# Patient Record
Sex: Male | Born: 1960 | Hispanic: No | Marital: Married | Smoking: Never smoker
Health system: Southern US, Community
[De-identification: ages and names within clinical notes are randomized; demographics above are authoritative.]

## PROBLEM LIST (undated history)

## (undated) DIAGNOSIS — N529 Male erectile dysfunction, unspecified: Secondary | ICD-10-CM

## (undated) DIAGNOSIS — I10 Essential (primary) hypertension: Secondary | ICD-10-CM

## (undated) DIAGNOSIS — R803 Bence Jones proteinuria: Secondary | ICD-10-CM

## (undated) DIAGNOSIS — I351 Nonrheumatic aortic (valve) insufficiency: Secondary | ICD-10-CM

## (undated) DIAGNOSIS — Z9289 Personal history of other medical treatment: Secondary | ICD-10-CM

## (undated) DIAGNOSIS — F419 Anxiety disorder, unspecified: Secondary | ICD-10-CM

## (undated) DIAGNOSIS — I340 Nonrheumatic mitral (valve) insufficiency: Secondary | ICD-10-CM

## (undated) DIAGNOSIS — D72819 Decreased white blood cell count, unspecified: Secondary | ICD-10-CM

## (undated) DIAGNOSIS — D649 Anemia, unspecified: Secondary | ICD-10-CM

## (undated) DIAGNOSIS — D472 Monoclonal gammopathy: Principal | ICD-10-CM

## (undated) DIAGNOSIS — D89813 Graft-versus-host disease, unspecified: Secondary | ICD-10-CM

## (undated) DIAGNOSIS — Z2821 Immunization not carried out because of patient refusal: Secondary | ICD-10-CM

## (undated) DIAGNOSIS — I341 Nonrheumatic mitral (valve) prolapse: Secondary | ICD-10-CM

## (undated) DIAGNOSIS — C9000 Multiple myeloma not having achieved remission: Principal | ICD-10-CM

## (undated) DIAGNOSIS — I2510 Atherosclerotic heart disease of native coronary artery without angina pectoris: Secondary | ICD-10-CM

## (undated) DIAGNOSIS — E785 Hyperlipidemia, unspecified: Secondary | ICD-10-CM

## (undated) HISTORY — DX: Multiple myeloma not having achieved remission: C90.00

## (undated) HISTORY — DX: Anemia, unspecified: D64.9

## (undated) HISTORY — DX: Monoclonal gammopathy: D47.2

## (undated) HISTORY — DX: Nonrheumatic mitral (valve) insufficiency: I34.0

## (undated) HISTORY — PX: COLONOSCOPY: SHX174

## (undated) HISTORY — PX: MITRAL VALVE SURGERY: SHX714

## (undated) HISTORY — DX: Male erectile dysfunction, unspecified: N52.9

## (undated) HISTORY — DX: Immunization not carried out because of patient refusal: Z28.21

## (undated) HISTORY — DX: Anxiety disorder, unspecified: F41.9

## (undated) HISTORY — DX: Hyperlipidemia, unspecified: E78.5

## (undated) HISTORY — DX: Decreased white blood cell count, unspecified: D72.819

## (undated) HISTORY — DX: Personal history of other medical treatment: Z92.89

## (undated) HISTORY — DX: Nonrheumatic aortic (valve) insufficiency: I35.1

## (undated) HISTORY — DX: Atherosclerotic heart disease of native coronary artery without angina pectoris: I25.10

## (undated) HISTORY — PX: CARDIAC SURGERY: SHX584

## (undated) HISTORY — DX: Bence Jones proteinuria: R80.3

## (undated) HISTORY — DX: Nonrheumatic mitral (valve) prolapse: I34.1

## (undated) HISTORY — DX: Essential (primary) hypertension: I10

---

## 1999-11-14 ENCOUNTER — Encounter: Payer: Self-pay | Admitting: Emergency Medicine

## 1999-11-14 ENCOUNTER — Emergency Department (HOSPITAL_COMMUNITY): Admission: EM | Admit: 1999-11-14 | Discharge: 1999-11-14 | Payer: Self-pay | Admitting: Emergency Medicine

## 2003-10-27 HISTORY — PX: CORONARY ARTERY BYPASS GRAFT: SHX141

## 2003-11-09 ENCOUNTER — Encounter: Admission: RE | Admit: 2003-11-09 | Discharge: 2003-11-09 | Payer: Self-pay | Admitting: *Deleted

## 2003-11-12 ENCOUNTER — Inpatient Hospital Stay (HOSPITAL_COMMUNITY): Admission: RE | Admit: 2003-11-12 | Discharge: 2003-11-21 | Payer: Self-pay | Admitting: *Deleted

## 2003-12-07 ENCOUNTER — Encounter
Admission: RE | Admit: 2003-12-07 | Discharge: 2003-12-07 | Payer: Self-pay | Admitting: Thoracic Surgery (Cardiothoracic Vascular Surgery)

## 2004-06-09 ENCOUNTER — Encounter
Admission: RE | Admit: 2004-06-09 | Discharge: 2004-06-09 | Payer: Self-pay | Admitting: Thoracic Surgery (Cardiothoracic Vascular Surgery)

## 2004-08-07 ENCOUNTER — Emergency Department (HOSPITAL_COMMUNITY): Admission: EM | Admit: 2004-08-07 | Discharge: 2004-08-07 | Payer: Self-pay | Admitting: Emergency Medicine

## 2005-06-12 ENCOUNTER — Encounter
Admission: RE | Admit: 2005-06-12 | Discharge: 2005-06-12 | Payer: Self-pay | Admitting: Thoracic Surgery (Cardiothoracic Vascular Surgery)

## 2006-04-04 ENCOUNTER — Ambulatory Visit: Payer: Self-pay | Admitting: Family Medicine

## 2006-08-13 ENCOUNTER — Ambulatory Visit: Payer: Self-pay | Admitting: Family Medicine

## 2007-07-14 ENCOUNTER — Ambulatory Visit: Payer: Self-pay | Admitting: Family Medicine

## 2007-07-17 ENCOUNTER — Ambulatory Visit: Payer: Self-pay | Admitting: Family Medicine

## 2007-09-17 ENCOUNTER — Ambulatory Visit: Payer: Self-pay | Admitting: Family Medicine

## 2008-02-18 ENCOUNTER — Ambulatory Visit: Payer: Self-pay | Admitting: Family Medicine

## 2008-03-10 ENCOUNTER — Ambulatory Visit: Payer: Self-pay | Admitting: Family Medicine

## 2008-05-04 ENCOUNTER — Ambulatory Visit: Payer: Self-pay | Admitting: Family Medicine

## 2009-01-25 ENCOUNTER — Ambulatory Visit: Payer: Self-pay | Admitting: Family Medicine

## 2010-02-22 DIAGNOSIS — Z9289 Personal history of other medical treatment: Secondary | ICD-10-CM

## 2010-02-22 HISTORY — DX: Personal history of other medical treatment: Z92.89

## 2010-10-10 ENCOUNTER — Ambulatory Visit: Payer: Self-pay | Admitting: Family Medicine

## 2010-10-11 ENCOUNTER — Ambulatory Visit (INDEPENDENT_AMBULATORY_CARE_PROVIDER_SITE_OTHER): Payer: BC Managed Care – PPO | Admitting: Medical

## 2010-10-11 ENCOUNTER — Encounter: Payer: Self-pay | Admitting: Medical

## 2010-10-11 VITALS — BP 118/78 | HR 60 | Temp 98.1°F | Ht 65.0 in | Wt 156.0 lb

## 2010-10-11 DIAGNOSIS — R6882 Decreased libido: Secondary | ICD-10-CM

## 2010-10-11 DIAGNOSIS — R5383 Other fatigue: Secondary | ICD-10-CM | POA: Insufficient documentation

## 2010-10-11 DIAGNOSIS — N529 Male erectile dysfunction, unspecified: Secondary | ICD-10-CM | POA: Insufficient documentation

## 2010-10-11 DIAGNOSIS — M25549 Pain in joints of unspecified hand: Secondary | ICD-10-CM

## 2010-10-11 DIAGNOSIS — M549 Dorsalgia, unspecified: Secondary | ICD-10-CM

## 2010-10-11 DIAGNOSIS — R5381 Other malaise: Secondary | ICD-10-CM

## 2010-10-11 LAB — COMPREHENSIVE METABOLIC PANEL
ALT: 21 U/L (ref 0–53)
AST: 18 U/L (ref 0–37)
Albumin: 4.3 g/dL (ref 3.5–5.2)
Alkaline Phosphatase: 27 U/L — ABNORMAL LOW (ref 39–117)
BUN: 18 mg/dL (ref 6–23)
CO2: 26 mEq/L (ref 19–32)
Calcium: 9.9 mg/dL (ref 8.4–10.5)
Chloride: 102 mEq/L (ref 96–112)
Creat: 0.89 mg/dL (ref 0.40–1.50)
Glucose, Bld: 89 mg/dL (ref 70–99)
Potassium: 4.9 mEq/L (ref 3.5–5.3)
Sodium: 140 mEq/L (ref 135–145)
Total Bilirubin: 0.9 mg/dL (ref 0.3–1.2)
Total Protein: 8.9 g/dL — ABNORMAL HIGH (ref 6.0–8.3)

## 2010-10-11 LAB — CBC WITH DIFFERENTIAL/PLATELET
Basophils Absolute: 0 10*3/uL (ref 0.0–0.1)
Basophils Relative: 0 % (ref 0–1)
Eosinophils Absolute: 0 10*3/uL (ref 0.0–0.7)
Eosinophils Relative: 1 % (ref 0–5)
HCT: 41.3 % (ref 39.0–52.0)
Hemoglobin: 13.7 g/dL (ref 13.0–17.0)
Lymphocytes Relative: 37 % (ref 12–46)
Lymphs Abs: 1.4 10*3/uL (ref 0.7–4.0)
MCH: 31.4 pg (ref 26.0–34.0)
MCHC: 33.2 g/dL (ref 30.0–36.0)
MCV: 94.7 fL (ref 78.0–100.0)
Monocytes Absolute: 0.4 10*3/uL (ref 0.1–1.0)
Monocytes Relative: 12 % (ref 3–12)
Neutro Abs: 1.9 10*3/uL (ref 1.7–7.7)
Neutrophils Relative %: 50 % (ref 43–77)
Platelets: 182 10*3/uL (ref 150–400)
RBC: 4.36 MIL/uL (ref 4.22–5.81)
RDW: 13.5 % (ref 11.5–15.5)
WBC: 3.7 10*3/uL — ABNORMAL LOW (ref 4.0–10.5)

## 2010-10-11 LAB — POCT URINALYSIS DIPSTICK
Bilirubin, UA: NEGATIVE
Blood, UA: NEGATIVE
Glucose, UA: NEGATIVE
Ketones, UA: NEGATIVE
Leukocytes, UA: NEGATIVE
Nitrite, UA: NEGATIVE
Protein, UA: NEGATIVE
Spec Grav, UA: 1.01
Urobilinogen, UA: NEGATIVE
pH, UA: 6

## 2010-10-11 LAB — TESTOSTERONE: Testosterone: 378.97 ng/dL (ref 250–890)

## 2010-10-11 LAB — CK: Total CK: 89 U/L (ref 7–232)

## 2010-10-11 LAB — TSH: TSH: 2.41 u[IU]/mL (ref 0.350–4.500)

## 2010-10-11 MED ORDER — TIZANIDINE HCL 4 MG PO TABS: ORAL_TABLET | ORAL | Status: DC

## 2010-10-11 NOTE — Progress Notes (Signed)
Subjective:   HPI Here for c/o joint pains in hands and fingers and wrists, intermittently since being on lisinopril for months.  The pain can come and go, is worse with activity. He denies any type of rheumatological disease in self or family. Using nothing for the pain. He sees his cardiologist regularly, last visit 2 months ago, had his cholesterol checked which was okay, and says from a cardiac standpoint is doing okay. His cardiologist mentioned that his blood pressure medicine could be a factor with the joint pain.  He also c/o back pain.  He notes back injury at age 14-yo, but reinjured this again few months ago shoveling.  Currently does flares up from time to time, feels a change in the muscle and low back on the right. Uses treadmill some for exercise off and on, and stretches periodically. He works in [employer].   Also wants testosterone checked.  Feels tired a lot, sex drive is down.  Uses viagra with good response.  Wife has been after him to get this checked.   No other complaint. Otherwise has been in his usual state of health.   Reviewed his prior medical, surgical, social history.  Past Medical History  Diagnosis Date  . Coronary artery disease     s/p CABG  . Mitral valve prolapse   . Allergy   . Hyperlipidemia     Review of Systems Constitutional: +fatigue, weight loss intentional (12lbs); denies fever, chills, sweats, unexpected weight change, anorexia Allergy: negative; denies recent sneezing, itching, congestion Dermatology: denies changing moles, rash, lumps, new worrisome lesions Cardiology: denies chest pain, palpitations, edema Respiratory: denies cough, shortness of breath, dyspnea on exertion, wheezing Gastroenterology: denies abdominal pain, nausea, vomiting, diarrhea, constipation, blood in stool, changes in bowel movement, dysphagia Hematology: denies bleeding or bruising problems Musculoskeletal: +back pain, joint pains in hands and fingers;   denies myalgias, joint swelling, neck pain, cramping, gait changes Ophthalmology: gradual decline in close up vision Urology: denies dysuria, difficulty urinating, hematuria, urinary frequency, urgency, incontinence Neurology: no weakness, tingling, numbness Psychology: denies depressed mood, agitation, sleep problems     Objective:   Physical Exam  General appearance: alert, no distress, WD/WN, white male Oral cavity: MMM, tongue normal Neck: supple, no lymphadenopathy, no thyromegaly, no masses, normal ROM Chest: non tender, normal shape and expansion, vertical surgical scar from prior CABG Heart: RRR, loud late systolic crescendo murmur heard throughout the left side, otherwise normal S2 Lungs: CTA bilaterally, no wheezes, rhonchi, or rales Abdomen: +bs, soft, non tender, non distended, no masses, no hepatomegaly, no splenomegaly, no bruits Back: Mild right lumbar paraspinal tenderness, otherwise non tender, normal ROM, no scoliosis Musculoskeletal: Multiple Bony arthritic changes of PIP and DIPs joints of fingers bilaterally, otherwise upper extremities non tender, no obvious deformity, normal ROM throughout, lower extremities non tender, no obvious deformity, normal ROM throughout Extremities: no edema, no cyanosis, no clubbing Pulses: 2+ symmetric, upper and lower extremities, normal cap refill Neurological: alert,strength normal upper extremities and lower extremities, sensation normal throughout, DTRs 2+ throughout, gait normal Psychiatric: normal affect, behavior normal, pleasant      Assessment & Plan:    Encounter Diagnoses  Name Primary?  Marland Kitchen Arthralgia of hand Yes  . Back pain   . Fatigue   . Libido, decreased     1) We will check labs today and call you with results 2) I prescribed Tizanidine muscle relaxer that you can use at bedtime for back pain and spasm 3) consider taking  Tylenol Arthritis twice daily for arthritis and back pain 4) your joint pains are in part  related to osteoarthritis.  I have included a handout below for information.

## 2010-10-11 NOTE — Patient Instructions (Addendum)
1) We will check labs today and call you with results 2) I prescribed Tizanidine muscle relaxer that you can use at bedtime for back pain and spasm 3) consider taking Tylenol Arthritis twice daily for arthritis and back pain 4) your joint pains are in part related to osteoarthritis.  I have included a handout below for information.   Osteoarthritis Osteoarthritis is the most common form of arthritis. It is redness, soreness, and swelling (inflammation) affecting the cartilage. Cartilage acts as a cushion, covering the ends of bones where they meet to form a joint. CAUSES Over time, the cartilage begins to wear away. This causes bone to rub on bone. This produces pain and stiffness in the affected joints. Factors that contribute to this problem are:  Excessive body weight.   Age.   Overuse of joints.  SYMPTOMS  People with osteoarthritis usually experience joint pain, swelling, or stiffness.   Over time, the joint may lose its normal shape.   Small deposits of bone (osteophytes) may grow on the edges of the joint.   Bits of bone or cartilage can break off and float inside the joint space. This may cause more pain and damage.   Osteoarthritis can lead to depression, anxiety, feelings of helplessness, and limitations on daily activities.  The most commonly affected joints are in the:  Ends of the fingers.   Thumbs.   Neck.   Lower back.   Knees.   Hips.  DIAGNOSIS Diagnosis is mostly based on your symptoms and exam. Tests may be helpful, including:  X-rays of the affected joint.   A computerized magnetic scan (MRI).   Blood tests to rule out other types of arthritis.   Joint fluid tests. This involves using a needle to draw fluid from the joint and examining the fluid under a microscope.  TREATMENT Goals of treatment are to control pain, improve joint function, maintain a normal body weight, and maintain a healthy lifestyle. Treatment approaches may include:  A  prescribed exercise program with rest and joint relief.   Weight control with nutritional education.   Pain relief techniques such as:   Properly applied heat and cold.   Electric pulses delivered to nerve endings under the skin (transcutaneous electrical nerve stimulation, TENS).   Massage.   Certain supplements. Ask your caregiver before using any supplements, especially in combination with prescribed drugs.   Medicines to control pain, such as:   Acetaminophen.   Nonsteroidal anti-inflammatory drugs (NSAIDs), such as naproxen.   Narcotic or central-acting agents, such as tramadol. This drug carries a risk of addiction and is generally prescribed for short-term use.   Corticosteroids. These can be given orally or as injection. This is a short-term treatment, not recommended for routine use.   Surgery to reposition the bones and relieve pain (osteotomy) or to remove loose pieces of bone and cartilage. Joint replacement may be needed in advanced states of osteoarthritis.  HOME CARE INSTRUCTIONS Your caregiver can recommend specific types of exercise. These may include:  Strengthening exercises. These are done to strengthen the muscles that support joints affected by arthritis. They can be performed with weights or with exercise bands to add resistance.   Aerobic activities. These are exercises, such as brisk walking or low-impact aerobics, that get your heart pumping. They can help keep your lungs and circulatory system in shape.   Range-of-motion activities. These keep your joints limber.   Balance and agility exercises. These help you maintain daily living skills.  Learning about your  condition and being actively involved in your care will help improve the course of your osteoarthritis. SEEK MEDICAL CARE IF:  You feel hot or your skin turns red.   You develop a rash in addition to your joint pain.   You have an oral temperature above 101.  FOR MORE INFORMATION National  Institute of Arthritis and Musculoskeletal and Skin Diseases: www.niams.http://www.myers.net/ [employer] on Aging: https://walker.com/ American College of Rheumatology: www.rheumatology.org Document Released: 05/14/2005 Document Re-Released: 11/01/2009 [facility] Patient Information 2011 [place], [place].

## 2010-10-12 LAB — SEDIMENTATION RATE: Sed Rate: 30 mm/hr — ABNORMAL HIGH (ref 0–16)

## 2010-10-13 NOTE — Op Note (Signed)
NAME:  [name], [name]                             ACCOUNT NO.:  [id number]   MEDICAL RECORD NO.:  [id number]                   PATIENT TYPE:  INP   LOCATION:  2016                                 FACILITY:  [facility]   PHYSICIAN:  Sheldon Silvan, M.D.                   DATE OF BIRTH:  1961/05/15   DATE OF PROCEDURE:  11/16/2003  DATE OF DISCHARGE:                                 OPERATIVE REPORT   OPERATION PERFORMED:  Intraoperative transesophageal echocardiography (TEE).   INDICATIONS FOR PROCEDURE:  Mr. Cueto was brought to the operating room by  Viviann Spare C. Dorris Fetch, M.D. for coronary artery bypass.  There was some  question of mitral valve disorder consisting of prolapse and regurgitation.  It was felt that intraoperative TEE would be appropriate method of  monitoring and also useful for diagnostic purposes.   DESCRIPTION OF PROCEDURE:  After induction of general anesthesia and  endotracheal intubation, the [employer]ipl[employer] TEE probe was  sheathed and lubricated adequately.  The probe was passed uneventfully after  one pass without difficulty into the esophagus through the pharynx.   The left ventricle was visualized and felt to be slightly thickened  concentrically.  There was good contractility noted except there was some  area-specific hypokinesis in the posterior wall.  The mitral valve apparatus  was examined and found to be billowing somewhat in the posterior leaflet.  There was 1 to 2+ regurgitation noted centrally.  Even with stress  maneuvers, this was not increased.  The left atrial appendage was examined  and found to be free of thrombus or smoke.   The aortic valve was examined and three leaflets were seen.  There was  little or no sclerosis noted.  In the long axis view and with the Color Flow  exam, there was 1% regurgitation in an eccentric fashion superiorly.  There  was no stenosis noted.  The interatrial septum was examined and no PFO was  noted on Color Flow  exam.  The tricuspid valve was examined and found to be  normal with 0 to trace regurgitation on Color Flow exam.  The right  ventricle was unremarkable.   The patient was placed on cardiopulmonary bypass by Dr. Dorris Fetch and five  bypasses were performed.  At the conclusion of the procedure, the patient  was weaned successfully from bypass and examination of the heart using TEE  was unchanged.  Monitoring of ventricular filling was performed during the  weaning process with successful separation from bypass.  The probe was  removed prior to transporting the patient to the SICU.                                               Sheldon Silvan, M.D.  DC/MEDQ  D:  11/18/2003  T:  11/20/2003  Job:  [id number]

## 2010-10-13 NOTE — Discharge Summary (Signed)
NAME:  [name], [name]                             ACCOUNT NO.:  [id number]   MEDICAL RECORD NO.:  [id number]                   PATIENT TYPE:  INP   LOCATION:  2016                                 FACILITY:  [facility]   PHYSICIAN:  Tyler Decent. Dorris Li, M.D.         DATE OF BIRTH:  [birth year]   DATE OF ADMISSION:  11/12/2003  DATE OF DISCHARGE:  11/21/2003                                 DISCHARGE SUMMARY   ADDENDUM TO JOB NUMBER [id number] [id number]:  As anticipated, [name] was discharged home  on November 21, 2003; however, the reflects a change in his discharge  medications:  1. Aspirin 81 mg 1 p.o. q.d.  2. Toprol-XL 25 mg 1 p.o. q.d.  3. Lipitor 10 mg 1 p.o. q.d.  4. Folate acid 1 mg p.o. q.d.  5. Lasix 20 mg 1 p.o. q.d.  6. Potassium 10 mEq 1 p.o. q.d.  7. Plavix 75 mg 1 p.o. q.d.  8. Allegra 180 mg 1 p.o. q.d.  9. Laxative of choice p.r.n. constipation.  10.      Ultram 50 mg 1 to 2 tablets p.o. q.4h. p.r.n. mild to moderate     pain.  11.      Tylox 1 to 2 tablets p.o. q.4-6h. p.r.n. severe pain.   All other additional discharge instructions are as previously dictated.      [name], P.A.                  [name], M.D.    AWZ/MEDQ  D:  11/21/2003  T:  11/22/2003  Job:  [id number]   cc:   Sharlot Gowda, M.D.  [address]  [place], [place] [address]  Fax: [phone]

## 2010-10-13 NOTE — Discharge Summary (Signed)
NAME:  [name], [name]                             ACCOUNT NO.:  [id number]   MEDICAL RECORD NO.:  [id number]                   PATIENT TYPE:  INP   LOCATION:  2016                                 FACILITY:  [facility]   PHYSICIAN:  Salvatore Decent. Dorris Fetch, M.D.         DATE OF BIRTH:  Sep 25, 1960   DATE OF ADMISSION:  11/12/2003  DATE OF DISCHARGE:  11/21/2003                                 DISCHARGE SUMMARY   ADMISSION DIAGNOSIS:  Strong family history of coronary artery disease and  an abnormal Cardiolite stress test.   ADDITIONAL DIAGNOSES/DISCHARGE DIAGNOSES:  1. Strong family history of coronary artery disease.  2. Severe three-vessel coronary artery disease, status post coronary artery     bypass grafting x5, completed on November 16, 2003.  3. Mildly depressed left ventricular systolic function with an ejection     fraction of 45-50%.  4. Hyperlipidemia.  5. Hypertension.   HOSPITAL MANAGEMENT/PROCEDURES:  1. Cardiac catheterization completed on November 12, 2003.  This revealed severe     three-vessel coronary artery disease and mildly depressed left     ventricular ejection fraction.  2. Preliminary arterial evaluation for planned cardiac surgery completed on     November 12, 2003.  This included bilateral carotid Duplex exams, palmar arch     exam, ankle/brachial indices.  3. Coronary artery bypass grafting x5 completed on November 16, 2003 by Dr.     Dorris Fetch.  4. Initiation or cardiac rehab Phase I.   CONSULTATIONS:  1. Care management.  2. Cardiac rehab.   HISTORY OF PRESENT ILLNESS:  Mr. Conly is a 50 year old male with a history  of borderline hypertension, hyperlipidemia, and a strong family history of  coronary artery disease, who had recently undergone a routine office visit  with Dr. Susann Givens at which time he noticed a new murmur.  The patient had no  significant previous cardiac history and specifically denied any prior  angina or CHF symptoms.  The patient was then referred to  Dr. Mikey Bussing  office of [facility].  He was seen and examined on October 28, 2003 by Dr. Jenne Campus.  Given the patient's significant family history of  the premature coronary artery disease, as well as the history of  hyperlipidemia and borderline hypertension, Dr. Jenne Campus felt that the  patient should undergo Cardiolite stress test to rule out the possibility of  ischemia.   The patient then underwent a Cardiolite stress test on November 01, 2003, which  showed evidence of inferior wall ischemia extending from the base toward the  apex.  The patient also underwent a 2-D echocardiogram which revealed mild  mitral regurgitation and moderate mitral valve prolapse.  The tricuspid was  in normal structure and function.  The aortic valve was mildly thickened and  revealed mild aortic regurgitation.   With the positive results of the persantine Cardiolite perfusion study, Dr.  Jenne Campus felt that the patient  should be further evaluated with a cardiac  catheterization.  This was planned for November 12, 2003.   HOSPITAL COURSE:  Mr. Rijos presented to [facility] on November 12, 2003  underwent elective right and left heart catheterization, completed by Dr.  Jenne Campus.  This revealed severe three-vessel coronary artery disease and a  mildly depressed left ventricular systolic function.  With these findings,  a CVTS consult was initiated.  Dr. Cornelius Moras of CVTS responded to the  consultation appropriately on November 12, 2003.  His impression was that,  indeed, the patient had significant, critical, three-vessel coronary artery  disease.  Specifically, there was a 90% stenosis of the proximal LAD and a  99% stenosis of the mid-LAD.  There was a 60% stenosis of the diagonal  branch.  There was a 99% proximal stenosis of a large first circumflex  marginal branch and a 99% stenosis of the mid-left circumflex coronary  artery.  There was a 95% proximal stenosis of the second circumflex marginal   branch, and a 95% stenosis of the distal right coronary artery.  There was  90% proximal stenosis of the posterior descending coronary artery with a  right dominant coronary circulation.  In the face of critical three-vessel  coronary artery disease with Class 3 stable angina, Dr. Cornelius Moras felt that the  patient would be an appropriate candidate for surgical revascularization.  An intraoperative transesophageal echocardiogram was planned to further  assess the questionable mitral valve regurgitation, and the possibility of  the need for concomitant mitral valve repair.  The risks, benefits, and  alternatives to the procedure were discussed with the patient and his family  at that time.  They were in understanding and agreed to proceed with  surgery.  The plan was made for Dr. Dorris Fetch to follow up with the  patient, since Dr. Cornelius Moras would be out of town later in the week.   Over the next several hospital days, the patient remained stable and free of  chest pain.  He underwent preliminary arterial evaluation for planned  cardiac surgery.  This included bilateral carotid Duplex exams which showed  no significant internal carotid artery stenosis.  The patient had positive  palmar arch tests bilaterally.  The patient had palpable peripheral pulses  and, therefore, normal ABIs.  The patient was maintained on IV heparin and  nitroglycerin as needed for any cardiac symptoms.   The patient was taken to the operating room then on November 16, 2003 and  underwent coronary artery bypass grafting x5.  The graft included left  internal mammary artery to the left anterior descending, free right internal  mammary artery to the obtuse marginal 1 and 2, saphenous vein graft to the  first diagonal, saphenous vein graft to the posterolateral branch.  The  greater saphenous vein was endoscopically harvested from the right thigh. Overall, the patient tolerated his procedure well and was transferred to the  surgical  intensive care unit in stable condition.   Postoperatively, the patient made routine progress.  His invasive lines were  discontinued, and his chest tubes were also discontinued in a stepwise  fashion.  He remained hemodynamically stable and afebrile.  He was initiated  on cardiac rehab Phase I.  He was started on appropriate cardiac  medications.  He was treated appropriately with pulmonary toilet.  The  patient was transferred to the stepdown unit without any difficulty on  postoperative day #2.   While on the stepdown unit, the patient continued to make  steady progress.  His [name] issue was that of his pulmonary status.  The patient still required  1-2 L of nasal cannula oxygen to maintain his saturations.  A chest x-ray  completed on November 19, 2003, showed mild bibasilar atelectasis and pleural  reaction and no evidence of pneumothorax, with mild vascular congestion.  We  will continue to wean the oxygen as tolerated, and the patient will likely  be discharged without supplemental oxygen.  We will continue appropriate  diuresis and pulmonary toilet for improvement of his pulmonary status.   The patient was deemed appropriate for initiation of discharge planning on  postoperative day #3 or November 19, 2003.  Overall, the patient was feeling  better than prior days.  He denied any chest pain, shortness of breath, or  nausea and vomiting.  His blood pressure was stable at 120/70.  Heart rate  was 80.  Temperature was 97.3, and SPO2 is 97% in room air.  The patient's  weight was 175.6 with a preoperative weight of 155.  The patient's heart was  in a regular rate of rhythm, reading normal sinus rhythm on telemetry.  His  lungs revealed bibasilar crackles.  His extremities were with 1+ lower  extremity edema.  His incisions were clean and dry without evidence of  infection.  His sternum was stable.  The patient had resumed normal bowel  and bladder function.  He was tolerating a regular diet.   He was tolerating  cardiac rehab Phase I without difficulty.   LABORATORY VALUES AT THE TIME OF INITIATION OF DISCHARGE PLANNING:  CBC  reads WBC 7.4, hemoglobin 9.5, hematocrit 27.0, platelet count 124,000.  BMET reads sodium 135, potassium 4.2, chloride 102, CO2 27, glucose 104, BUN  14, and creatinine 0.8.   We will continue as planned, will discharge to home likely on November 21, 2003  pending further rounds and no change in the patient's clinical status.   DISCHARGE MEDICATIONS:  1. Aspirin 325 mg daily.  2. Toprol-XL 25 mg daily.  3. Lipitor 10 mg daily.  4. Folic acid 1 mg daily.  5. Tylox 1 or 2 tablets every 4-6 hours as needed for pain.   DISCHARGE INSTRUCTIONS:  1. Activity:  The patient is to avoid driving.  He is to avoid heavy lifting     or strenuous activity.  He is to continue to walk daily.  The patient     should continue his breathing exercises. 2. Diet:  The patient is to follow a low-cholesterol, low-fat, Heart Healthy     diet.  3. Wound care:  The patient may shower.  He is to wash his incisions daily     with soap and water.  He should notify the CVTS office if he has any     redness, swelling, or drainage from his incisions.   FOLLOW-UP APPOINTMENTS:  1. The patient is to see Dr. Dorris Fetch on July 12 at 2 p.m.  He should     obtain a chest x-ray at [facility] one hour prior to     this visit and hand carry the films to Dr. Sunday Corn office.  2. The patient is to see Dr. Jenne Campus as directed by [facility].      Carolyn A. Eustaquio Boyden.                  Salvatore Decent Dorris Fetch, M.D.    CAF/MEDQ  D:  11/19/2003  T:  11/21/2003  Job:  [id number]   cc:   Salvatore Decent. Dorris Fetch, M.D.  [address]  [place]  [place] [address]   Darlin Priestly, M.D.  [phone]  [place]  [place] [address]  Fax: [phone]   Sharlot Gowda, M.D.  [address]  [place], [place] [address]  Fax: [phone]

## 2010-10-13 NOTE — Consult Note (Signed)
NAME:  [name], [name]                             ACCOUNT NO.:  [id number]   MEDICAL RECORD NO.:  [id number]                   PATIENT TYPE:  OIB   LOCATION:  6523                                 FACILITY:  [facility]   PHYSICIAN:  Salvatore Decent. Cornelius Moras, M.D.              DATE OF BIRTH:  1960/07/08   DATE OF CONSULTATION:  11/12/2003  DATE OF DISCHARGE:                                   CONSULTATION   REASON FOR CONSULTATION:  Critical 3 vessel coronary artery disease.   HISTORY OF PRESENT ILLNESS:  Mr. Haselton is a 50 year old previously healthy  white male with known history of hyperlipidemia and strong family history of  coronary artery disease. Mr. Penrod does report that over the last 5 years or  so, he has had exertional symptoms that he had blamed on his back but now in  retrospect, sounds typical for angina. He describes a muscular aching pain,  which typically begins in his back and at times, radiates through to the  middle of his chest and down both of his arms. This pain always is brought  on by strenuous physical activity and relieved by rest. He denies any  episodes of similar pain with mild activity and he specifically denies any  episodes of similar pain that occur at rest or have awaken him from sleep.  He has always attributed this to chronic trouble with his back. Recently Mr.  Favero was evaluated by Dr. Susann Givens and found to have a new murmur on physical  examination. He was referred to Dr. Jenne Campus for elective consultation and he  subsequently underwent a 2-D echocardiogram and a stress Cardiolite  examination. Echocardiogram performed on November 01, 2003 demonstrated mitral  valve prolapse with prolapse involving the posterior leaflet of the mitral  valve and mild mitral regurgitation. There is moderate dilatation of the  left atrium. There was trace tricuspid regurgitation. No other significant  abnormalities were identified. Mr. Leibold subsequently underwent a stress  Cardiolite  examination also on November 01, 2003. This examination was abnormal  with resting ejection fraction of 56% and centographic evidence of  suggestive of inferior wall ischemia. The patient developed ST segment  depression in his inferolateral leads during stress. Mr. Hohmann underwent  elective cardiac catheterization today by Dr. Jenne Campus. This demonstrates  critical 3 vessel coronary artery disease with mild left ventricular  dysfunction and only trace mitral regurgitation. Cardiac surgical  consultation has been requested.   REVIEW OF SYSTEMS:  GENERAL:  The patient reports feeling well otherwise. He  has good appetite. He has not been gaining or losing weight. CARDIAC:  Notable for symptoms of class 2 exertional angina. The patient has had some  increasing fatigue as well as some dizzy spells. He denies any syncopal  episodes. He denies any at rest pain. He denies any exertional, shortness of  breath, or resting shortness of breath. He denies any  paroxysmal nocturnal  dyspnea, orthopnea, or low extremity edema. RESPIRATORY:  Negative. The  patient denies recent productive cough, hemoptysis, wheezing.  GASTROINTESTINAL:  Notable for some mild constipation. The patient has  normal swallowing function. He denies history of hematochezia, hematemesis,  melena. NEUROLOGIC:  Negative. MUSCULOSKELETAL:  Notable only for chronic  back pain, which sounds more like angina. GENITOURINARY:  Negative.  INFECTIOUS:  Negative.  HEMATOLOGIC:  Negative. HEENT:  Negative.  PSYCHIATRIC:  Negative.   PAST MEDICAL HISTORY:  1. Hyperlipidemia.  2. Borderline hypertension.  3. Family history of coronary artery disease.  4. The patient specifically denies any known history of diabetes mellitus,     previous stroke, coronary artery disease, myocardial infarction.   PAST SURGICAL HISTORY:  Vasectomy.   SOCIAL HISTORY:  The patient is married and lives with his wife in [place]. They have 3 children. 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 He is a non-smoker and denies history of alcohol  consumption.   CURRENT MEDICATIONS:  Include Toprol XL, Lipitor, aspirin, Diovan, Allegra.  Toprol XL, aspirin, and Diovan are all new.   ALLERGIES:  None known.   PHYSICAL EXAMINATION:  GENERAL:  A well appearing white male who appears his  stated age in no acute distress.  VITAL SIGNS:  Blood pressure 130/70, pulse 68 and regular. He is afebrile  with oxygen saturation of 100%.  HEENT:  Examination is grossly unrevealing.  NECK:  Supple. There is no cervical or supraclavicular lymphadenopathy.  There is no jugular venous distention. No carotid bruits are noted.  CHEST:  Auscultation includes clear and symmetrical breath sounds  bilaterally. No wheezes or rhonchi are demonstrated.  CARDIOVASCULAR:  Examination is notable for regular rate and rhythm. There  is a grade 3 to 4 over 6 systolic murmur, which is heard best at the apex  with radiation all across the precordium into the axilla. No diastolic  murmurs are noted.  ABDOMEN:  Soft and nontender. Bowel sounds are present.  EXTREMITIES:  Warm and well perfused. There is no lower extremity edema.  Distal pulses are easily palpable in both lower legs at the ankle. There is  no sign of venous insufficiency.  RECTAL/GENITOURINARY:  Examinations are both deferred.  NEUROLOGIC:  Examination is grossly non-focal and symmetric throughout.   LABORATORY DATA:  Cardiac catheterization performed by Dr. Jenne Campus today has  been reviewed. This demonstrates critical 3 vessel coronary artery disease  with preserved left ventricular function. Specifically, there is 90%  stenosis of proximal left anterior descending coronary artery. There is 99%  stenosis of the mid left anterior descending coronary artery after take-off  of the first diagonal branch. There is 60% proximal stenosis of the diagonal branch. There is 99% proximal stenosis  of a large first circumflex marginal  branch. There is 99% stenosis of the mid left circumflex coronary artery  after the first circumflex marginal branch and before a medium sized second  circumflex marginal branch. There is 95% proximal stenosis of the second  circumflex marginal branch. There is 40% proximal and 50% stenosis of the  mid right coronary artery. There is 95% stenosis of the distal right  coronary artery and 90% proximal stenosis of the posterior descending  coronary artery with right dominant coronary circulation. Left ventricular  function appears fairly well preserved with mild anteroapical hypokinesis.  Ejection fraction is estimated at 50%. There is only trace mitral  regurgitation.   IMPRESSION:  Critical 3  vessel coronary artery disease with class 3 stable  angina. Mr. Coombs has history of mitral valve prolapse with mild mitral  regurgitation on recent 2-D echocardiogram, and he does have a prominent  murmur on physical examination. However, he has no symptoms of congestive  heart failure. I believe he would best be treated by elective coronary  artery bypass grafting. He will need intra-operative transesophageal  echocardiogram to further assess his mitral valve and the possibility of  need for concomitant for mitral valve repair.   PLAN:  I have outlined options at length with Mr. Waren and his family. All  of his questions have been addressed. Unfortunately, I will be away all of  next week. I believe that Mr. Moose should remain in the hospital on  anticoagulation therapy until surgery due to the critical nature of his  coronary artery blockages. Dr. Dorris Fetch will followup tomorrow and assist  with further plans and facilitate schedule of surgery, sometime next week.  All of Mr. Charlesworth questions have been addressed.                                               Salvatore Decent. Cornelius Moras, M.D.    CHO/MEDQ  D:  11/12/2003  T:  11/13/2003  Job:  [id number]   cc:   Darlin Priestly, M.D.  [address]  [place]  [place] [address]  Fax: [phone]   Sharlot Gowda, M.D.  [address]  [place], [place] [address]  Fax: [phone]

## 2010-10-13 NOTE — Op Note (Signed)
NAME:  [name], [name]                             ACCOUNT NO.:  [id number]   MEDICAL RECORD NO.:  [id number]                   PATIENT TYPE:  INP   LOCATION:  2303                                 FACILITY:  [facility]   PHYSICIAN:  Salvatore Decent. Dorris Fetch, M.D.         DATE OF BIRTH:  [birth year]   DATE OF PROCEDURE:  11/16/2003  DATE OF DISCHARGE:                                 OPERATIVE REPORT   PREOPERATIVE DIAGNOSES:  Three-vessel coronary disease, mitral prolapse.   POSTOPERATIVE DIAGNOSES:  Three-vessel coronary disease, mitral prolapse.  Mild mitral regurgitation, mild aortic insufficiency.   OPERATION PERFORMED:  Median sternotomy, extracorporeal circulation,  coronary artery bypass grafting times five (left internal mammary artery to  left anterior descending, free right internal mammary artery to obtuse  marginal 1 and 2, saphenous vein graft to first diagonal, saphenous vein  graft to posterolateral), endoscopic vein harvest right thigh.   SURGEON:  Salvatore Decent. Dorris Fetch, M.D.   ASSISTANT:  Pecola Leisure, PA   ANESTHESIA:  General.   FINDINGS:  Transesophageal echocardiography revealed good wall motion pre  and post bypass.  There was mitral prolapse but only trace to 1+ mitral  regurgitation pre and post bypass. There was 1+aortic insufficiency pre and  post bypass.  LAD, diagonal and posterolateral good quality targets.  OM1  and 2 fair quality.  Posterior descending diffusely diseased, too small to  graft.  OM3 too small to graft.   INDICATIONS FOR PROCEDURE:  Mr.  [name] is a 50 year old gentleman who  presents with class 2 anginal symptoms.  He had a positive stress test and  at catheterization was found to have severe three-vessel coronary disease.  He also noted to have mild mitral regurgitation.  Patient was advised to  undergo coronary artery bypass grafting.  The indications, risks, benefits  and alternative procedures were discussed in detail with the patient and  he  understood that the mitral valve would be evaluated with transesophageal  echocardiogram intraoperatively and if there was significant mitral  regurgitation, repair would be performed at that time.  He understood and  accepted the risks and agreed to proceed.   DESCRIPTION OF PROCEDURE:  [name] was brought to the preop holding area on  November 16, 2003.  Lines were placed by anesthesia to monitor arterial, central  venous and pulmonary arterial pressure.  EKG leads were placed for  continuous telemetry.  The patient was taken to the operating room,  anesthetized and intubated.  A Foley catheter was placed.  The chest,  abdomen and legs were prepped and draped in the usual fashion.   A median sternotomy was performed and the left internal mammary artery was  harvested using standard technique.  It was a good quality vessel.  Simultaneously, an incision was made in the medial aspect of the right leg  and the greater saphenous vein was harvested endoscopically from the right  thigh.  The  saphenous vein was of good quality as well.  The patient was  given 5000 units of heparin prior to dividing the distal end of the mammary  artery.  There was good flow through the cut end of the vessel.  The mammary  was placed back into the pleural space after being treated with topical  papaverine.  Next, the right internal mammary artery was harvested in the  same fashion.  This vessel bifurcated relatively early and without  sufficient length for use as a pedicle graft.  Therefore, after evaluating  its distal end and inspecting for hemostasis and treating with topical  papaverine, the proximal end was divided.  The mammary artery was placed in  a heparinized papaverine saline solution.  The proximal stump was suture  ligated with a 2-0 silk suture.   The pericardium was opened.  The ascending aorta was inspected and palpated.  There was no palpable atherosclerotic disease.  The aorta was cannulated  via  concentric 2-0 Ethibond pledgeted pursestring sutures.  A dual stage venous  cannula was placed via pursestring suture in the right atrial appendage.  Cardiopulmonary bypass was instituted and the patient was cooled to 32  degrees Celsius.  The coronary arteries were inspected and anastomotic sites  were chosen.  Of note, with inspection of the coronaries, the posterior  descending actually was a small 1 mm calcified ungraftable vessel.  The  vessel that had shown up on catheterization was a large posterolateral  branch.  This was the only graftable vessel in the distribution in the  distal right coronary.  OM1 and 2 were fair quality targets.  OM3 was a very  small vessel and not accessible for grafting.  The LAD and diagonal were  good quality targets.  The conduits were inspected and cut to length.  A foam pad was placed in the  pericardium to protect the left phrenic nerve.  A temperature probe was  placed in the myocardial septum and a cardioplegia cannula was placed in the  ascending aorta.  A retrograde cardioplegia cannula was placed via  pursestring suture in the right atrial appendage and directed into the  coronary sinus.  Positioning was confirmed with palpation of the tip as well  as coronary sinus wedge pressure tracing with balloon inflation.   Of note on the prebypass transesophageal echocardiography there was a  prolapsing mitral valve but only very mild mitral regurgitation even with  provocative measures.  There was mild aortic insufficiency noted as well.  Therefore the use of the retrograde cannula.  The aorta was crossclamped.  The left ventricle was emptied via the aortic root vent.  Cardiac arrest  then was achieved with a combination of cold antegrade blood cardioplegia  and topical iced saline.  After achieving a complete diastolic arrest and  adequate myocardial septal cooling, the following distal anastomoses were  performed.  First a reversed saphenous  vein graft was placed end-to-side to the  posterolateral branch of the right coronary artery.  This was a 1.5 mm good  quality target.  The vein graft was of good quality.  The anastomosis was  performed with a running 7-0 Prolene suture in end-to-side fashion.  There  was excellent flow through the graft.  Cardioplegia was administered.  There  was good hemostasis at the anastomosis.   Next, a reversed saphenous vein graft was placed end-to-side to the first  diagonal branch of the LAD.  This was a large dominant diagonal branch.  It  was 1.5 vessel.  It did have diffuse plaquing but no significant disease at  the site of the anastomosis.  The anastomosis was performed with a running 7-  0 Prolene suture.  Again there was excellent flow through this graft as  well.  Cardioplegia was administered with good hemostasis.   Next, the free right internal mammary artery was prepared for grafting.  A  longitudinal arteriotomy was made over the site of the first obtuse  marginal.  This was a 1.3 mm approximate sized vessel.  It was of fair  quality.  It was superficially intramyocardial.  A side-to-side anastomosis  was performed with a running 8-0 Prolene suture.  The anastomosis was probed  proximally and distally to ensure patency prior to tying the suture.  Next,  the distal end of the mammary artery was beveled and anastomosed end-to-side  to the second obtuse marginal.  This vessel likewise was approximately 1.3  mm in diameter and also was superficially intramyocardial.  The end-to-side  anastomosis was performed with a running 8-0 Prolene suture.  At the  completion of the anastomosis cardioplegia was administered.  There was  bleeding from the mammary at the distal end which required takedown and redo  of the distal anastomosis to the OM2.  Following this anastomosis there was  good hemostasis and good flow through the graft.  Cardioplegia was  administered.   Next, the left internal  mammary artery was brought through a window in the  pericardium.  The distal end of the mammary artery was spatulated and was  anastomosed end-to-side to the LAD.  The LAD again also had diffuse plaquing  but was of good quality at the site of anastomosis. The anastomosis was  performed with a running 8-0 Prolene suture.  At the completion of the  mammary to LAD anastomosis the bulldog clamp was briefly removed to inspect  for hemostasis.  Immediate and rapid septal rewarming was noted.  The  bulldog clamp was replaced.  The mammary pedicle was tacked to the  epicardial surface of the heart with 6-0 Prolene sutures.  After once again  achieving an adequate myocardial cooling with cardioplegia, the proximal  anastomoses were performed while under cross-clamp.  The free right mammary  proximal anastomosis was performed to a 4.0 mm punch aortotomy with running  7-0 Prolene suture.  Proximal vein graft anastomoses were then performed to 4.4 mm punch aortotomies with running 6-0 Prolene sutures.  At the  completion of the final proximal anastomosis, the aortic root was deaired.  The bulldog clamp was again removed from the mammary artery.  Immediate and  rapid septal rewarming was noted.  Lidocaine was administered.  The patient  was placed in Trendelenburg position.  The aortic cross-clamp was removed.  The total crossclamp time was 131 minutes.  All proximal and distal  anastomoses were inspected for hemostasis.  Epicardial pacing wires were  placed on the right ventricle and right atrium and when the patient had been  rewarmed to a core temperature of 37 degrees Celsius, he was weaned from  cardiopulmonary bypass without difficulty.  The total bypass time was 195  minutes.  The initial cardiac index was greater than 2 L per minute per  meter squared.  Post bypass transesophageal echocardiography revealed good  wall motion in all segments.  There was no change in the mild aortic and  mitral  insufficiency.   The test dose of protamine was administered and was well tolerated.  The  atrial and aortic cannulae were removed.  The remainder of the protamine was  administered.  The chest was irrigated with 1L of warm normal saline  containing 1 gm of vancomycin.  Hemostasis was achieved.  Bilateral pleural  and two mediastinal chest tubes were placed through separate subcostal  incisions.  The pericardium was reapproximated with interrupted 3-0 silk  sutures.  It came together easily without tension.  The sternum was closed  with heavy gauge stainless steel wires.  There was transient drop in cardiac  index after closure of the chest, no other hemodynamic changes.  This  responded appropriately to volume administration.  The patient then remained  hemodynamically stable thereafter.  The remainder of the incisions were  closed in standard fashion.  All sponge, needle and instrument counts were  correct at the end of the procedure.  The patient was taken from the  operating room to the surgical intensive care unit in critical but stable  condition.                                               Salvatore Decent Dorris Fetch, M.D.    SCH/MEDQ  D:  11/17/2003  T:  11/18/2003  Job:  [id number]   cc:   Darlin Priestly, M.D.  [phone], [address]  [place]  [place] [address]  Fax: [phone]

## 2010-10-13 NOTE — Cardiovascular Report (Signed)
NAME:  [name], [name]                             ACCOUNT NO.:  [id number]   MEDICAL RECORD NO.:  [id number]                   PATIENT TYPE:  OIB   LOCATION:  6523                                 FACILITY:  [facility]   PHYSICIAN:  Darlin Priestly, M.D.             DATE OF BIRTH:  1961/03/29   DATE OF PROCEDURE:  11/12/2003  DATE OF DISCHARGE:                              CARDIAC CATHETERIZATION   PROCEDURES:  1. Left heart catheterization.  2. Coronary angiography.  3. Left ventriculogram.   ATTENDING PHYSICIAN:  Darlin Priestly, M.D.   COMPLICATIONS:  None.   INDICATIONS:  [name] is a 50 year old male patient of Dr. Sharlot Gowda  with history of hypertension, hyperlipidemia, strong family history of  coronary artery disease who recently was noted to have a murmur and  subsequently referred to our office.  2-D echocardiogram revealed mitral  valve prolapse with posterior mitral valve leaflet with mild mitral  regurgitation.  At that time, he was noted to have low normal EF with mild  posterior wall hypokinesis.  He subsequently underwent Cardiolite scan on  November 01, 2003 secondary to his strong family history and risk factors  suggesting inferior wall ischemia with an EF of 56%.  He is now referred for  cardiac catheterization to rule out significant coronary artery disease.   DESCRIPTION OF OPERATION:  After obtaining informed written consent, the  patient was brought to the cardiac catheterization laboratory.  Right and  left groin were shaved and prepped and draped in the usual sterile fashion.  ECG monitor was established.  Using the modified Seldinger technique, a 6  French arterial sheath was inserted in the right femoral artery. A 6 French  diagnostic catheter was then used to performed diagnostic angiography.  This  reveals a large left main with mild 20% distal tapering.  LAD is a large  vessel which courses the apex and gives rise to one large diagonal branch.  The LAD  is noted to have calcification in its proximal segment.  There is  90% proximal LAD stenosis and a 99% mid LAD stenosis after the takeoff of  the first large diagonal branch.  First diagonal is a large vessel which  bifurcates distally and has 50-60% mid vessel stenosis.   Left circumflex is a large vessel which courses in AV groove and goes to two  obtuse marginal branches.  AV groove circumflex is noted to have a 99% long  segmental lesion after the takeoff of the first OM.  The first OM is a large  vessel which bifurcates distally and has a 99% proximal stenosis and an 80%  proximal stenosis in the lower bifurcation.  The second OM is a medium size  vessel which bifurcates distally and has a 99% proximal stenosis.   The right coronary artery is a large vessel and is dominant and gives rise  to both PDA as  well as posterior lateral branches.  There is calcification  noted in the proximal portion of the RCA.  The RCA is a large vessel which  is coarsely irregular with 40% proximal, 50% mid and 99% distal stenosis.  The PDA and posterior lateral branch are medium size vessel.  The PDA has a  90% ostial lesion.  The PLA has 60% mid vessel stenosis.   LEFT VENTRICULOGRAM:  Left ventriculogram reveals mildly depressed EF of 45-  50%.  There appears to be mild global hypokinesis with anterior lateral  hypokinesis.   HEMODYNAMICS:  Systemic arterial pressure 111/73, LV pressure 115/12, LVEDP  of 19.   CONCLUSIONS:  1. Significant three-vessel coronary artery disease.  2. Mildly depressed left ventricular systolic function.  3. Elevated LVEDP.                                               Darlin Priestly, M.D.    RHM/MEDQ  D:  11/12/2003  T:  11/12/2003  Job:  [id number]   cc:   Sharlot Gowda, M.D.  [address]  [place], [place] [address]  Fax: [phone]

## 2010-10-16 ENCOUNTER — Telehealth: Payer: Self-pay | Admitting: *Deleted

## 2010-10-16 NOTE — Telephone Encounter (Addendum)
Message copied by Ellsworth Lennox on Mon Oct 16, 2010  9:17 AM ------      Message from: [place], DAVID      Created: Fri Oct 13, 2010  5:46 PM       1) labs show normal testosterone, marker for muscle damage normal, thyroid normal, liver, kidney, lytes, blood counts normal.  Sed rate marker for inflammation and total protein were elevated.  I recommend we recheck these lab values (sed rate/total protein) and urine again in 49mo.        2) for now, advised he eat healthy, exercise regularly.  Have him use the Tylenol Arthritis we discussed twice daily, Tizanidine muscle relaxer at bedtime.      3) Lets recheck in a month, particularly if not improving.    Patient informed of lab results.  Patient scheduled for a follow up (recheck urine and total protein/sed rate) on 11-08-2010 at 8:30 am.  CM, LPN

## 2010-11-08 ENCOUNTER — Ambulatory Visit: Payer: BC Managed Care – PPO | Admitting: Medical

## 2011-02-26 ENCOUNTER — Telehealth: Payer: Self-pay | Admitting: Family Medicine

## 2011-02-26 MED ORDER — VARDENAFIL HCL 20 MG PO TABS: 20.0000 mg | ORAL_TABLET | ORAL | Status: DC | PRN

## 2011-02-26 NOTE — Telephone Encounter (Signed)
He can get Levitra cheaper than Viagra and would like this called in.

## 2011-05-14 ENCOUNTER — Other Ambulatory Visit (INDEPENDENT_AMBULATORY_CARE_PROVIDER_SITE_OTHER): Payer: BC Managed Care – PPO

## 2011-05-14 DIAGNOSIS — Z23 Encounter for immunization: Secondary | ICD-10-CM

## 2011-06-07 ENCOUNTER — Encounter: Payer: Self-pay | Admitting: Internal Medicine

## 2011-06-14 ENCOUNTER — Other Ambulatory Visit: Payer: BC Managed Care – PPO

## 2011-07-24 ENCOUNTER — Telehealth: Payer: Self-pay | Admitting: Family Medicine

## 2011-07-24 MED ORDER — SILDENAFIL CITRATE 100 MG PO TABS
100.0000 mg | ORAL_TABLET | Freq: Every day | ORAL | Status: DC | PRN
Start: 2011-07-24 — End: 2012-08-18

## 2011-07-24 NOTE — Telephone Encounter (Signed)
Viagra renewed.

## 2011-07-24 NOTE — Telephone Encounter (Signed)
Left message med called in

## 2011-07-24 NOTE — Telephone Encounter (Signed)
Let him know that I called the medication and for him

## 2011-08-02 ENCOUNTER — Ambulatory Visit (INDEPENDENT_AMBULATORY_CARE_PROVIDER_SITE_OTHER): Payer: BC Managed Care – PPO | Admitting: Family Medicine

## 2011-08-02 ENCOUNTER — Encounter: Payer: Self-pay | Admitting: Family Medicine

## 2011-08-02 VITALS — BP 124/86 | HR 63 | Wt 167.0 lb

## 2011-08-02 DIAGNOSIS — E785 Hyperlipidemia, unspecified: Secondary | ICD-10-CM

## 2011-08-02 DIAGNOSIS — L309 Dermatitis, unspecified: Secondary | ICD-10-CM

## 2011-08-02 DIAGNOSIS — J309 Allergic rhinitis, unspecified: Secondary | ICD-10-CM | POA: Insufficient documentation

## 2011-08-02 DIAGNOSIS — Z1211 Encounter for screening for malignant neoplasm of colon: Secondary | ICD-10-CM

## 2011-08-02 DIAGNOSIS — Z951 Presence of aortocoronary bypass graft: Secondary | ICD-10-CM

## 2011-08-02 DIAGNOSIS — L259 Unspecified contact dermatitis, unspecified cause: Secondary | ICD-10-CM

## 2011-08-02 MED ORDER — TRIAMCINOLONE ACETONIDE 0.1 % EX CREA: TOPICAL_CREAM | Freq: Two times a day (BID) | CUTANEOUS | Status: AC

## 2011-08-02 NOTE — Progress Notes (Signed)
  Subjective:    Patient ID: [name], male    DOB: [birth year], 51 y.o.   MRN: [id number]  HPI He is here for evaluation no rashes he has on his arms. He has several small lesions and has been using cortisone cream on this. He has no other concerns or complaints. He does see his cardiologist regularly. Lipid results were done by his cardiologist on the last visit several months ago. He's had no chest pain, shortness of breath or DOE.   Review of Systems     Objective:   Physical Exam Alert and in no distress. Exam of the skin does show 3 one to one and a half centimeter slightly dry lesions.       Assessment & Plan:   1. Dermatitis    2. Special screening for malignant neoplasms, colon  [facility] COLONOSCOPY  3. Hx of CABG    4. Hyperlipidemia LDL goal <70     He will continue to be followed by cardiology. I will set him up for colonoscopy due to his age. Recommend triamcinolone cream for the nonspecific dermatitis.

## 2011-08-02 NOTE — Patient Instructions (Signed)
Use the cream sparingly as needed.

## 2011-08-21 ENCOUNTER — Encounter: Payer: Self-pay | Admitting: Internal Medicine

## 2011-08-22 ENCOUNTER — Ambulatory Visit: Payer: BC Managed Care – PPO | Admitting: Internal Medicine

## 2011-09-05 ENCOUNTER — Encounter: Payer: Self-pay | Admitting: Cardiology

## 2011-09-17 ENCOUNTER — Encounter: Payer: Self-pay | Admitting: Internal Medicine

## 2011-09-18 ENCOUNTER — Ambulatory Visit: Payer: BC Managed Care – PPO | Admitting: Internal Medicine

## 2012-02-29 ENCOUNTER — Other Ambulatory Visit: Payer: Self-pay | Admitting: Family Medicine

## 2012-03-03 NOTE — Telephone Encounter (Signed)
IS THIS OK 

## 2012-04-04 ENCOUNTER — Telehealth: Payer: Self-pay | Admitting: Internal Medicine

## 2012-04-04 MED ORDER — VARDENAFIL HCL 20 MG PO TABS: 20.0000 mg | ORAL_TABLET | Freq: Every day | ORAL | Status: DC | PRN

## 2012-04-04 NOTE — Telephone Encounter (Signed)
Levitra called in 

## 2012-06-06 ENCOUNTER — Encounter: Payer: Self-pay | Admitting: Internal Medicine

## 2012-06-10 ENCOUNTER — Other Ambulatory Visit (HOSPITAL_COMMUNITY): Payer: Self-pay | Admitting: Cardiology

## 2012-06-10 DIAGNOSIS — I341 Nonrheumatic mitral (valve) prolapse: Secondary | ICD-10-CM

## 2012-06-10 DIAGNOSIS — I340 Nonrheumatic mitral (valve) insufficiency: Secondary | ICD-10-CM

## 2012-07-09 ENCOUNTER — Inpatient Hospital Stay (HOSPITAL_COMMUNITY): Admission: RE | Admit: 2012-07-09 | Payer: Self-pay | Source: Ambulatory Visit

## 2012-07-15 ENCOUNTER — Other Ambulatory Visit (HOSPITAL_COMMUNITY): Payer: Self-pay | Admitting: Cardiology

## 2012-07-15 DIAGNOSIS — I340 Nonrheumatic mitral (valve) insufficiency: Secondary | ICD-10-CM

## 2012-07-15 DIAGNOSIS — I341 Nonrheumatic mitral (valve) prolapse: Secondary | ICD-10-CM

## 2012-07-25 ENCOUNTER — Ambulatory Visit (HOSPITAL_COMMUNITY)
Admission: RE | Admit: 2012-07-25 | Discharge: 2012-07-25 | Disposition: A | Discharge status: Home / Self Care | Payer: BC Managed Care – PPO | Source: Ambulatory Visit | Attending: Cardiology | Admitting: Cardiology

## 2012-07-25 DIAGNOSIS — I059 Rheumatic mitral valve disease, unspecified: Secondary | ICD-10-CM | POA: Insufficient documentation

## 2012-07-25 DIAGNOSIS — I341 Nonrheumatic mitral (valve) prolapse: Secondary | ICD-10-CM

## 2012-07-25 DIAGNOSIS — I340 Nonrheumatic mitral (valve) insufficiency: Secondary | ICD-10-CM

## 2012-07-25 NOTE — Progress Notes (Signed)
[facility]   2D echo completed 07/25/2012.   Cindy Raigan Baria, RDCS  

## 2012-07-30 ENCOUNTER — Encounter: Payer: Self-pay | Admitting: Family Medicine

## 2012-08-13 ENCOUNTER — Telehealth: Payer: Self-pay | Admitting: Family Medicine

## 2012-08-13 NOTE — Telephone Encounter (Signed)
Let him know that it has been a little over a year and he needs to come in for a med check.

## 2012-08-13 NOTE — Telephone Encounter (Signed)
Pt called and wants refill for Viagra sent to [facility] on [address]     Pt# [phone]

## 2012-08-13 NOTE — Telephone Encounter (Signed)
CALLED PT TO INFORM HIM HE NEEDED A MED CHECK BEFORE REFILL LEFT MESSAGE WORD FOR WORD

## 2012-08-15 ENCOUNTER — Ambulatory Visit (INDEPENDENT_AMBULATORY_CARE_PROVIDER_SITE_OTHER): Payer: BC Managed Care – PPO | Admitting: Medical

## 2012-08-15 ENCOUNTER — Encounter: Payer: Self-pay | Admitting: Medical

## 2012-08-15 ENCOUNTER — Telehealth: Payer: Self-pay | Admitting: Family Medicine

## 2012-08-15 VITALS — BP 120/80 | HR 60 | Temp 98.0°F | Resp 16 | Wt 174.0 lb

## 2012-08-15 DIAGNOSIS — Z1211 Encounter for screening for malignant neoplasm of colon: Secondary | ICD-10-CM

## 2012-08-15 DIAGNOSIS — R778 Other specified abnormalities of plasma proteins: Secondary | ICD-10-CM

## 2012-08-15 DIAGNOSIS — N529 Male erectile dysfunction, unspecified: Secondary | ICD-10-CM

## 2012-08-15 DIAGNOSIS — Z2821 Immunization not carried out because of patient refusal: Secondary | ICD-10-CM

## 2012-08-15 DIAGNOSIS — I2510 Atherosclerotic heart disease of native coronary artery without angina pectoris: Secondary | ICD-10-CM

## 2012-08-15 DIAGNOSIS — R799 Abnormal finding of blood chemistry, unspecified: Secondary | ICD-10-CM

## 2012-08-15 DIAGNOSIS — E785 Hyperlipidemia, unspecified: Secondary | ICD-10-CM

## 2012-08-15 HISTORY — DX: Immunization not carried out because of patient refusal: Z28.21

## 2012-08-15 NOTE — Telephone Encounter (Signed)
Message copied by Janeice Robinson on Fri Aug 15, 2012  4:19 PM ------      Message from: Jac Canavan      Created: Fri Aug 15, 2012  9:06 AM       Refer to Dr. Loreta Ave for colonoscopy      Get copy of last [facility], Dr. Herbie Baltimore cardiology office note. ------

## 2012-08-15 NOTE — Telephone Encounter (Signed)
I tried to schedule his colonscopy with Dr. Loreta Ave but Rep. At his office states that he will need to contact them to schedule the appointment so I gave him the phone number to the office. CLS  I called over to [facility] and requested the records for his last office visit. CLS

## 2012-08-15 NOTE — Progress Notes (Signed)
Subjective: Here for med check, routine followup.   He hasn't been here in a while.  Last visit a year ago for skin issue.  He has hx/o coronary artery disease s/p CABG, Hyperlipidemia, ED, aortic insufficiency, MVP.    Saw cardiology in 06/2012. They manage his lipids, medications, heart issues.  No changes, was continued on same medication, no worse or no better cardiac function.   Was advised yearly f/u.  He does not take NTG and has none at home.    Exercise: not exercising currently.  Hasn't worked in 3 mo due to the ice and weather. Does asphalt paving.  Hasn't been active due to being out of work.   Diet: up and down.  Eats meat 3 times daily.   More chicken and fish than beef.  Eats about 6-8 oz of meat TID.  He also eats a lot of other protein sources including beans, yogurt.  He eats greens, some fruits, but tries to avoid breads.    ED - Takes Viagra 100mg , 1/2 tablet along with Levitra 20mg , 1/2 tablet prn.  This seems to work better than either medication alone at 1/2 or 1 whole tablet each.  Been doing this regimen for years.    No new c/o.  Allergies  Allergen Reactions  . Toprol Xl (Metoprolol Tartrate)     intolerance    Current Outpatient Prescriptions on File Prior to Visit  Medication Sig Dispense Refill  . aspirin 81 MG tablet Take 81 mg by mouth daily.        . clopidogrel (PLAVIX) 75 MG tablet Take 75 mg by mouth daily.        . clopidogrel (PLAVIX) 75 MG tablet Take 75 mg by mouth daily.      Marland Kitchen ezetimibe-simvastatin (VYTORIN) 10-20 MG per tablet Take 1 tablet by mouth at bedtime.      . fish oil-omega-3 fatty acids 1000 MG capsule Take 2 g by mouth daily.        . folic acid (FOLVITE) 800 MCG tablet Take 400 mcg by mouth daily.        Marland Kitchen lisinopril (PRINIVIL,ZESTRIL) 10 MG tablet Take 10 mg by mouth daily.        . Loratadine (CLARITIN PO) Take 5 mg by mouth daily.        . sildenafil (VIAGRA) 100 MG tablet Take 1 tablet (100 mg total) by mouth daily as needed.  10  tablet  11  . vardenafil (LEVITRA) 20 MG tablet Take 1 tablet (20 mg total) by mouth daily as needed for erectile dysfunction.  10 tablet  0   No current facility-administered medications on file prior to visit.    Past Medical History  Diagnosis Date  . Coronary artery disease     s/p CABG; followed by Dr. Bryan Lemma, [facility]  . Mitral valve prolapse   . Allergy   . Hyperlipidemia   . Echocardiogram abnormal 07/25/12    LV mild dilation, mild septal hypertrophy, moderate aortic regurg and mitral regurg, severe L atrium dilation, no regional wall abnormality, LV diastolic function normal; Dr. Bryan Lemma  . Aortic insufficiency   . Erectile dysfunction   . H/O cardiovascular stress test 02/23/10    normal perfusion, no ischemia or infarct; treadmill and Myoview perfusion study; Dr. Lynnea Ferrier  . Pneumococcal vaccine refused 08/15/2012     Past Surgical History  Procedure Laterality Date  . Coronary artery bypass graft  10/2003    LIMA  to LAD, RIMA to OM1, sequential to OM2; Dr. Lynnea Ferrier  . Colonoscopy      ?    History reviewed. No pertinent family history.  History   Social History  . Marital Status: Married    Spouse Name: N/A    Number of Children: N/A  . Years of Education: N/A   Occupational History  . Not on file.   Social History Main Topics  . Smoking status: Never Smoker   . Smokeless tobacco: Never Used  . Alcohol Use: No  . Drug Use: No  . Sexually Active: Not on file   Other Topics Concern  . Not on file   Social History Narrative  . No narrative on file    Reviewed their medical, surgical, family, social, medication, and allergy history and updated chart as appropriate.  Objective: Filed Vitals:   08/15/12 0813  BP: 120/80  Pulse: 60  Temp: 98 F (36.7 C)  Resp: 16    General appearance: alert, no distress, WD/WN, overweight white male Neck: supple, no lymphadenopathy, no thyromegaly, no masses, no  bruits Heart: seems to be possible click and systolic crescendo III/VI murmur, heard throughout, but best in left upper sternal borderm, radiates to left axilla, no obvious diastolic murmur Lungs: CTA bilaterally, no wheezes, rhonchi, or rales Abdomen: +bs, soft, non tender, non distended, no masses, no hepatomegaly, no splenomegaly Pulses: 2+ symmetric, upper and lower extremities, normal cap refill Ext: no edema Refuses GU/DRE today   Assessment: Encounter Diagnoses  Name Primary?  . Coronary artery disease Yes  . Elevated total protein   . Erectile dysfunction   . Hyperlipidemia   . Special screening for malignant neoplasms, colon     Plan: CAD - followed by Dr. Herbie [place] at [facility].  Reviewed his recent Echocardiogram 07/2012, CME and NMR lipo profile labs.    Elevated total protein - possibly due to significant dietary protein intake.  Nevertheless, SPEP, UPEP labs ordered.  He had elevated protein on metabolic panel in 2012 and 07/2012 CMET.  Need to rule out other causes.  Advised he limit meat intake to 3oz per meal, and don't eat meal every meal or even daily.  Advised more greens, fruits, limited breads, but do eat some whole grains.  discussed the impact on diet with his history of heart disease, weight, and reducing further heart risks and kidney risks.   ED - c/t his usual regimen, 1/2 tablet Levitra + 1/2 tablet Viagra.  He is aware of risks/benefits, precaution. He is not on nitrates.   Hyperlipidemia - managed by cardiology.  Will refer for 1st screening colonoscopy.  Discussed the reasons for this, usual process, answered his questions.   Counseled on screening exams, vaccines.  Recommended pneumococcal vaccine but he declines.  Advised prostate exam today but he declines.   Follow-up pending labs.

## 2012-08-18 ENCOUNTER — Other Ambulatory Visit: Payer: Self-pay | Admitting: Family Medicine

## 2012-08-18 ENCOUNTER — Telehealth: Payer: Self-pay | Admitting: Family Medicine

## 2012-08-18 ENCOUNTER — Other Ambulatory Visit: Payer: BC Managed Care – PPO

## 2012-08-18 DIAGNOSIS — R778 Other specified abnormalities of plasma proteins: Secondary | ICD-10-CM

## 2012-08-18 DIAGNOSIS — I2510 Atherosclerotic heart disease of native coronary artery without angina pectoris: Secondary | ICD-10-CM

## 2012-08-18 LAB — CBC WITH DIFFERENTIAL/PLATELET
Basophils Absolute: 0 10*3/uL (ref 0.0–0.1)
Basophils Relative: 0 % (ref 0–1)
Eosinophils Absolute: 0 10*3/uL (ref 0.0–0.7)
Eosinophils Relative: 1 % (ref 0–5)
HCT: 37.5 % — ABNORMAL LOW (ref 39.0–52.0)
Hemoglobin: 12.7 g/dL — ABNORMAL LOW (ref 13.0–17.0)
Lymphocytes Relative: 48 % — ABNORMAL HIGH (ref 12–46)
Lymphs Abs: 1.8 10*3/uL (ref 0.7–4.0)
MCH: 32.2 pg (ref 26.0–34.0)
MCHC: 33.9 g/dL (ref 30.0–36.0)
MCV: 94.9 fL (ref 78.0–100.0)
Monocytes Absolute: 0.3 10*3/uL (ref 0.1–1.0)
Monocytes Relative: 9 % (ref 3–12)
Neutro Abs: 1.5 10*3/uL — ABNORMAL LOW (ref 1.7–7.7)
Neutrophils Relative %: 42 % — ABNORMAL LOW (ref 43–77)
Platelets: 227 10*3/uL (ref 150–400)
RBC: 3.95 MIL/uL — ABNORMAL LOW (ref 4.22–5.81)
RDW: 14.4 % (ref 11.5–15.5)
WBC: 3.7 10*3/uL — ABNORMAL LOW (ref 4.0–10.5)

## 2012-08-18 NOTE — Telephone Encounter (Signed)
Pt called and stated you were going to refill his Viagra and nothing yet at pharmacy.  He was here on Friday.  Please rx to [facility] on [address].

## 2012-08-18 NOTE — Telephone Encounter (Signed)
IS THIS OK 

## 2012-08-18 NOTE — Telephone Encounter (Signed)
Pt called and states that he can get #32 tablets at one time from [place] for $432.00 and pt wants you to write a paper rx.  Please call when ready [phone]

## 2012-08-18 NOTE — Telephone Encounter (Signed)
So what is he wanting, paper script for [place] or local script here?

## 2012-08-19 LAB — VITAMIN D 25 HYDROXY (VIT D DEFICIENCY, FRACTURES): Vit D, 25-Hydroxy: 35 ng/mL (ref 30–89)

## 2012-08-19 NOTE — Telephone Encounter (Signed)
I called and spoke with the patient trying to get an understanding as to what he needs. He said he doesn't need a paper script at this time. He said that he was going to contact the pharmacy there and see if they can't contact us for the Rx. CLS

## 2012-08-20 LAB — PROTEIN ELECTROPHORESIS, SERUM
Albumin ELP: 43.8 % — ABNORMAL LOW (ref 55.8–66.1)
Alpha-1-Globulin: 2.6 % — ABNORMAL LOW (ref 2.9–4.9)
Alpha-2-Globulin: 6.5 % — ABNORMAL LOW (ref 7.1–11.8)
Beta 2: 41.2 % — ABNORMAL HIGH (ref 3.2–6.5)
Beta Globulin: 4.9 % (ref 4.7–7.2)
Gamma Globulin: 1 % — ABNORMAL LOW (ref 11.1–18.8)
M-Spike, %: 3.7 g/dL
Total Protein, Serum Electrophoresis: 9.6 g/dL — ABNORMAL HIGH (ref 6.0–8.3)

## 2012-08-20 NOTE — Telephone Encounter (Signed)
Pt called today and states [facility] will be faxing over a request for his Viagra # 32 tablets.  Once we get the fax we will need to write a paper rx for the viagra for #32 tablets.  Please give this to Lafonda Mosses when written to keep the confusion down and I will advise pt when done. [phone]

## 2012-08-21 ENCOUNTER — Other Ambulatory Visit: Payer: Self-pay | Admitting: Family Medicine

## 2012-08-21 LAB — IMMUNOFIXATION INTE

## 2012-08-21 MED ORDER — SILDENAFIL CITRATE 100 MG PO TABS: 100.0000 mg | ORAL_TABLET | ORAL | Status: DC | PRN

## 2012-08-22 LAB — PROTEIN ELECTROPHORESIS, URINE REFLEX
Albumin: 7.3 %
Alpha-1-Globulin, U: 10.6 %
Alpha-2-Globulin, U: 10.1 %
Beta Globulin, U: 57.4 %
Gamma Globulin, U: 14.6 %
Total Protein, Urine/Day: 190 mg/d — ABNORMAL HIGH (ref 50–100)
Total Protein, Urine: 10 mg/dL

## 2012-08-25 ENCOUNTER — Telehealth: Payer: Self-pay | Admitting: Family Medicine

## 2012-08-26 ENCOUNTER — Telehealth: Payer: Self-pay | Admitting: Family Medicine

## 2012-08-26 ENCOUNTER — Telehealth: Payer: Self-pay | Admitting: Oncology

## 2012-08-26 NOTE — Telephone Encounter (Signed)
S/W PT IN RE NP APPT 05/07 @ 1:30 W/DR. GRANFORTUNA.  REFERRING - [facility] DX- CONSULT FOR ELEVATED PROTEIN. WELCOME PACKET MAILED

## 2012-08-26 NOTE — Telephone Encounter (Signed)
I called and spoke to wife, answered her questions.  They are aware of appt for colonoscopy and oncology

## 2012-08-26 NOTE — Telephone Encounter (Signed)
Patient's wife would like to know why are you sending her husband to see oncologists verses a urology for the elevated protein? CLS

## 2012-08-26 NOTE — Telephone Encounter (Signed)
LVOM FOR PT TO RETURN CALL.  °

## 2012-08-27 ENCOUNTER — Telehealth: Payer: Self-pay | Admitting: Family Medicine

## 2012-08-27 ENCOUNTER — Encounter: Payer: Self-pay | Admitting: Medical

## 2012-08-27 ENCOUNTER — Telehealth: Payer: Self-pay | Admitting: Oncology

## 2012-08-27 NOTE — Telephone Encounter (Signed)
C/D 08/27/12 for appt. 10/01/12

## 2012-08-27 NOTE — Telephone Encounter (Signed)
Patient is aware of his appointment at [facility] on 10/01/12 @ 130 pm to see Dr. Cyndie Chime. CLS [phone]

## 2012-09-01 ENCOUNTER — Encounter: Payer: Self-pay | Admitting: Oncology

## 2012-09-01 ENCOUNTER — Other Ambulatory Visit: Payer: Self-pay | Admitting: Oncology

## 2012-09-01 DIAGNOSIS — D649 Anemia, unspecified: Secondary | ICD-10-CM

## 2012-09-01 DIAGNOSIS — D72819 Decreased white blood cell count, unspecified: Secondary | ICD-10-CM

## 2012-09-01 DIAGNOSIS — D472 Monoclonal gammopathy: Secondary | ICD-10-CM

## 2012-09-01 DIAGNOSIS — R803 Bence Jones proteinuria: Secondary | ICD-10-CM

## 2012-09-01 HISTORY — DX: Monoclonal gammopathy: D47.2

## 2012-09-01 HISTORY — DX: Bence Jones proteinuria: R80.3

## 2012-09-01 HISTORY — DX: Anemia, unspecified: D64.9

## 2012-09-01 HISTORY — DX: Decreased white blood cell count, unspecified: D72.819

## 2012-09-02 ENCOUNTER — Telehealth: Payer: Self-pay | Admitting: Oncology

## 2012-09-02 NOTE — Telephone Encounter (Signed)
Talked to pt's wife gave her appt for 09/10/12

## 2012-09-08 NOTE — Telephone Encounter (Signed)
LM

## 2012-09-10 ENCOUNTER — Other Ambulatory Visit (HOSPITAL_BASED_OUTPATIENT_CLINIC_OR_DEPARTMENT_OTHER): Payer: BC Managed Care – PPO | Admitting: Lab

## 2012-09-10 ENCOUNTER — Ambulatory Visit: Payer: BC Managed Care – PPO

## 2012-09-10 ENCOUNTER — Encounter: Payer: Self-pay | Admitting: Oncology

## 2012-09-10 DIAGNOSIS — R803 Bence Jones proteinuria: Secondary | ICD-10-CM

## 2012-09-10 DIAGNOSIS — D72819 Decreased white blood cell count, unspecified: Secondary | ICD-10-CM

## 2012-09-10 DIAGNOSIS — D472 Monoclonal gammopathy: Secondary | ICD-10-CM

## 2012-09-10 DIAGNOSIS — R809 Proteinuria, unspecified: Secondary | ICD-10-CM

## 2012-09-10 DIAGNOSIS — D649 Anemia, unspecified: Secondary | ICD-10-CM

## 2012-09-10 LAB — COMPREHENSIVE METABOLIC PANEL (CC13)
ALT: 30 U/L (ref 0–55)
AST: 20 U/L (ref 5–34)
Albumin: 3.5 g/dL (ref 3.5–5.0)
Alkaline Phosphatase: 31 U/L — ABNORMAL LOW (ref 40–150)
BUN: 16.9 mg/dL (ref 7.0–26.0)
CO2: 25 mEq/L (ref 22–29)
Calcium: 9.2 mg/dL (ref 8.4–10.4)
Chloride: 103 mEq/L (ref 98–107)
Creatinine: 1 mg/dL (ref 0.7–1.3)
Glucose: 95 mg/dl (ref 70–99)
Potassium: 4.6 mEq/L (ref 3.5–5.1)
Sodium: 137 mEq/L (ref 136–145)
Total Bilirubin: 0.85 mg/dL (ref 0.20–1.20)
Total Protein: 10.4 g/dL — ABNORMAL HIGH (ref 6.4–8.3)

## 2012-09-10 LAB — CBC & DIFF AND RETIC
BASO%: 0.3 % (ref 0.0–2.0)
Basophils Absolute: 0 10*3/uL (ref 0.0–0.1)
EOS%: 1.2 % (ref 0.0–7.0)
Eosinophils Absolute: 0 10*3/uL (ref 0.0–0.5)
HCT: 37.8 % — ABNORMAL LOW (ref 38.4–49.9)
HGB: 13 g/dL (ref 13.0–17.1)
Immature Retic Fract: 19.9 % — ABNORMAL HIGH (ref 3.00–10.60)
LYMPH%: 48.3 % (ref 14.0–49.0)
MCH: 32.4 pg (ref 27.2–33.4)
MCHC: 34.4 g/dL (ref 32.0–36.0)
MCV: 94.3 fL (ref 79.3–98.0)
MONO#: 0.4 10*3/uL (ref 0.1–0.9)
MONO%: 12.1 % (ref 0.0–14.0)
NEUT#: 1.2 10*3/uL — ABNORMAL LOW (ref 1.5–6.5)
NEUT%: 38.1 % — ABNORMAL LOW (ref 39.0–75.0)
Platelets: 185 10*3/uL (ref 140–400)
RBC: 4.01 10*6/uL — ABNORMAL LOW (ref 4.20–5.82)
RDW: 13.4 % (ref 11.0–14.6)
Retic %: 1.67 % (ref 0.80–1.80)
Retic Ct Abs: 66.97 10*3/uL (ref 34.80–93.90)
WBC: 3.2 10*3/uL — ABNORMAL LOW (ref 4.0–10.3)
lymph#: 1.6 10*3/uL (ref 0.9–3.3)
nRBC: 0 % (ref 0–0)

## 2012-09-10 LAB — MORPHOLOGY: PLT EST: ADEQUATE

## 2012-09-10 LAB — CHCC SMEAR

## 2012-09-10 LAB — LACTATE DEHYDROGENASE (CC13): LDH: 146 U/L (ref 125–245)

## 2012-09-10 NOTE — Progress Notes (Signed)
Checked in new pt with no financial concerns. °

## 2012-09-12 LAB — KAPPA/LAMBDA LIGHT CHAINS
Kappa free light chain: 27.9 mg/dL — ABNORMAL HIGH (ref 0.33–1.94)
Kappa:Lambda Ratio: 697.5 — ABNORMAL HIGH (ref 0.26–1.65)
Lambda Free Lght Chn: 0.04 mg/dL — ABNORMAL LOW (ref 0.57–2.63)

## 2012-09-12 LAB — IMMUNOFIXATION ELECTROPHORESIS
IgA: 13 mg/dL — ABNORMAL LOW (ref 68–379)
IgG (Immunoglobin G), Serum: 4030 mg/dL — ABNORMAL HIGH (ref 650–1600)
IgM, Serum: 7 mg/dL — ABNORMAL LOW (ref 41–251)
Total Protein, Serum Electrophoresis: 10.1 g/dL — ABNORMAL HIGH (ref 6.0–8.3)

## 2012-09-12 LAB — BETA 2 MICROGLOBULIN, SERUM: Beta-2 Microglobulin: 1.84 mg/L — ABNORMAL HIGH (ref 1.01–1.73)

## 2012-09-15 ENCOUNTER — Encounter: Payer: Self-pay | Admitting: Medical

## 2012-09-24 ENCOUNTER — Ambulatory Visit (INDEPENDENT_AMBULATORY_CARE_PROVIDER_SITE_OTHER): Payer: BC Managed Care – PPO | Admitting: Medical

## 2012-09-24 ENCOUNTER — Encounter: Payer: Self-pay | Admitting: Medical

## 2012-09-24 VITALS — BP 130/80 | HR 68 | Temp 98.9°F | Resp 16 | Wt 168.0 lb

## 2012-09-24 DIAGNOSIS — J309 Allergic rhinitis, unspecified: Secondary | ICD-10-CM

## 2012-09-24 MED ORDER — AZELASTINE HCL 0.15 % NA SOLN: 1.0000 | Freq: Two times a day (BID) | NASAL | Status: DC

## 2012-09-24 NOTE — Progress Notes (Signed)
Subjective:  [name] is a 52 y.o. male who presents for several day hx/o cough, back pain, achy, nasal drainage, sore throat, ear fullness and popping, clear runny nose, has had low grade fever.  No sinus pressure, no sneezing, no itchy watery eyes.  Patient is a non-smoker.  Using Allegra, Zyrtec, claritin, cough medication OTC for symptoms.  Wife has similar symptoms.  No other aggravating or relieving factors.  No other c/o.  Past Medical History  Diagnosis Date  . Coronary artery disease     s/p CABG; followed by Dr. Bryan Lemma, [facility]  . Mitral valve prolapse   . Allergy   . Hyperlipidemia   . Echocardiogram abnormal 07/25/12    LV mild dilation, mild septal hypertrophy, moderate aortic regurg and mitral regurg, severe L atrium dilation, no regional wall abnormality, LV diastolic function normal; Dr. Bryan Lemma  . Aortic insufficiency   . Erectile dysfunction   . H/O cardiovascular stress test 02/23/10    normal perfusion, no ischemia or infarct; treadmill and Myoview perfusion study; Dr. Lynnea Ferrier  . Pneumococcal vaccine refused 08/15/2012  . Monoclonal gammopathy 09/01/2012    Increased total protein, increased beta globulin peak with decreased gammaglobulin peak on SPEP 08/18/12  IFE not done "possible faint abnormal band"  . Bence Jones proteinuria 09/01/2012    Borderline increase protein 190 mg on 24 hr urine IFE with free kappa light chains (lab normal 50-100 mg)  08/18/12  . Normochromic normocytic anemia 09/01/2012    Hb 12.7, MCV 95  08/18/12 was 13.7 1 year ago; concomitant leukopenia  & increased lymphs  . Leukopenia 09/01/2012    WBC 3,700 42 poly, 48 lymphs, 9 monos 08/18/12  Was 3,700 1 year ago   ROS as in subjective  Objective: Filed Vitals:   09/24/12 1348  BP: 130/80  Pulse: 68  Temp: 98.9 F (37.2 C)  Resp: 16    General appearance: Alert, WD/WN, no distress                             Skin: warm, no rash     Head:  no sinus tenderness                            Eyes: conjunctiva normal, corneas clear, PERRLA                            Ears: pearly TMs, external ear canals normal                          Nose: septum midline, turbinates mildly swollen, there is erythema and clear discharge             Mouth/throat: MMM, tongue normal, mild pharyngeal erythema                           Neck: supple, shoddy anterior nodes, no thyromegaly, nontender                         Lungs: CTA bilaterally, no wheezes, rales, or rhonchi      Assessment and Plan: Encounter Diagnosis  Name Primary?  . Allergic rhinitis Yes    Prescription given for Astepro.     Patient Instructions  Symptoms suggest allergy flare.    Recommendations:  Nasal saline flush or spray daily or twice daily to flush out pollen from nose  Take a shower in the evening, to wash off pollen  Use mask if outside mowing  Continue Allegra daily at bedtime  Begin back on Astepro nasal spray, 1-2 sprays per nostril twice daily  If symptoms don't improve in the next 4-5 days, let me know.  If worse, fever over 101, if significant sinus pressure/pain, colored drainage frequently, then call back.

## 2012-09-24 NOTE — Patient Instructions (Signed)
Symptoms suggest allergy flare.    Recommendations:  Nasal saline flush or spray daily or twice daily to flush out pollen from nose  Take a shower in the evening, to wash off pollen  Use mask if outside mowing  Continue Allegra daily at bedtime  Begin back on Astepro nasal spray, 1-2 sprays per nostril twice daily  If symptoms don't improve in the next 4-5 days, let me know.  If worse, fever over 101, if significant sinus pressure/pain, colored drainage frequently, then call back.

## 2012-09-25 ENCOUNTER — Telehealth: Payer: Self-pay | Admitting: Medical

## 2012-09-25 MED ORDER — MOMETASONE FUROATE 50 MCG/ACT NA SUSP: 2.0000 | Freq: Every day | NASAL | Status: DC

## 2012-09-25 NOTE — Telephone Encounter (Signed)
He is still having allergy symptoms. I will add Nasonex to his regimen.

## 2012-09-26 ENCOUNTER — Ambulatory Visit (INDEPENDENT_AMBULATORY_CARE_PROVIDER_SITE_OTHER): Payer: BC Managed Care – PPO | Admitting: Family Medicine

## 2012-09-26 ENCOUNTER — Encounter: Payer: Self-pay | Admitting: Family Medicine

## 2012-09-26 VITALS — BP 140/80 | HR 76 | Temp 98.2°F | Wt 170.0 lb

## 2012-09-26 DIAGNOSIS — J029 Acute pharyngitis, unspecified: Secondary | ICD-10-CM

## 2012-09-26 DIAGNOSIS — J040 Acute laryngitis: Secondary | ICD-10-CM

## 2012-09-26 NOTE — Patient Instructions (Signed)
Treat your symptoms. Also voice rest

## 2012-09-26 NOTE — Addendum Note (Signed)
Addended by: Lavell Islam on: 09/26/2012 12:50 PM   Modules accepted: Orders

## 2012-09-26 NOTE — Progress Notes (Signed)
  Subjective:    Patient ID: [name], male    DOB: 30-Jul-1960, 52 y.o.   MRN: [id number]  HPI 5 days ago he developed sore throat, PND, dry hacking cough ,ear congestion. He has now noticed hoarse voice.he continues on medications listed in the chart. He has seen his cardiologist at the beginning of the year.   Review of Systems     Objective:   Physical Exam alert and in no distress. Tympanic membranes and canals are normal. Throat is clear. Tonsils are normal. Neck is supple without adenopathy or thyromegaly. Cardiac exam shows a regular sinus rhythm with a 3/6 SEM, no gallops. Lungs are clear to auscultation. Strep screen is negative        Assessment & Plan:

## 2012-09-29 LAB — POCT RAPID STREP A (OFFICE): Rapid Strep A Screen: NEGATIVE

## 2012-09-29 NOTE — Addendum Note (Signed)
Addended by: Ronnald Nian on: 09/29/2012 11:38 AM   Modules accepted: Orders

## 2012-10-01 ENCOUNTER — Ambulatory Visit (HOSPITAL_COMMUNITY)
Admission: RE | Admit: 2012-10-01 | Discharge: 2012-10-01 | Disposition: A | Discharge status: Home / Self Care | Payer: BC Managed Care – PPO | Source: Ambulatory Visit | Attending: Oncology | Admitting: Oncology

## 2012-10-01 ENCOUNTER — Ambulatory Visit: Payer: Self-pay

## 2012-10-01 ENCOUNTER — Other Ambulatory Visit: Payer: Self-pay | Admitting: Lab

## 2012-10-01 ENCOUNTER — Ambulatory Visit (HOSPITAL_BASED_OUTPATIENT_CLINIC_OR_DEPARTMENT_OTHER): Payer: BC Managed Care – PPO | Admitting: Oncology

## 2012-10-01 ENCOUNTER — Telehealth: Payer: Self-pay | Admitting: Oncology

## 2012-10-01 VITALS — BP 134/85 | HR 67 | Temp 97.8°F | Resp 18 | Ht 65.0 in | Wt 168.8 lb

## 2012-10-01 DIAGNOSIS — R803 Bence Jones proteinuria: Secondary | ICD-10-CM

## 2012-10-01 DIAGNOSIS — M5137 Other intervertebral disc degeneration, lumbosacral region: Secondary | ICD-10-CM | POA: Insufficient documentation

## 2012-10-01 DIAGNOSIS — D649 Anemia, unspecified: Secondary | ICD-10-CM

## 2012-10-01 DIAGNOSIS — D472 Monoclonal gammopathy: Secondary | ICD-10-CM | POA: Insufficient documentation

## 2012-10-01 DIAGNOSIS — M51379 Other intervertebral disc degeneration, lumbosacral region without mention of lumbar back pain or lower extremity pain: Secondary | ICD-10-CM | POA: Insufficient documentation

## 2012-10-01 DIAGNOSIS — M47812 Spondylosis without myelopathy or radiculopathy, cervical region: Secondary | ICD-10-CM | POA: Insufficient documentation

## 2012-10-01 DIAGNOSIS — M5030 Other cervical disc degeneration, unspecified cervical region: Secondary | ICD-10-CM | POA: Insufficient documentation

## 2012-10-01 NOTE — Progress Notes (Signed)
New Patient Hematology-Oncology Evaluation   [name] [id number] 01-04-1961 52 y.o. 10/01/2012  CC: Dr. Sharlot Gowda; Dr. Zorita Pang   Reason for referral: IgG monoclonal gammopathy rule out multiple myeloma   HPI:  New patient evaluation for this 52 year old man who has been in overall excellent health without any major medical or surgical problems except for coronary artery disease and hypertension. He had a 5 vessel coronary bypass procedure by Dr. Dorris Fetch in 2005. He was also found to have valvular heart disease. Recent routine laboratory done through his primary care physician's office showed a mild normochromic anemia hemoglobin 12.7, hematocrit 37.5, MCV 95, white count 3700, 42 neutrophils, 48 lymphocytes, platelet count 227,000, on 08/18/2012. Hemoglobin one year ago was 13.7. He also had a low white count at that time 3700. A chemistry profile showed elevation of serum total protein of 9.6 g percent with repeat 10.1 g percent done on 09/10/2012. Serum protein electrophoresis shows a monoclonal spike in the beta 2 region by pathologist report but  on my review of the tracing this peak is in the gamma region. There is monoclonal kappa protein on immunofixation electrophoresis. Quantitative serum immunoglobulins show elevation of IgG at 4030 mg percent with concomitant suppression of IgA at 13 and IgM at 7. Serum kappa free light chains elevated at 27.9 mg percent with suppression of lambda light chains at 0.04 making the kappa lambda ratio 697.5. A beta-2 microglobulin borderline elevated at 1.84 ( 1.01-1.73). Calcium is 9.2 with albumin 3.5. BUN 17, creatinine 1.0 done 09/10/2012. A 24-hour urine collection with a total protein of 190 mg (50-100) with free kappa light chains on IFE.  He has had no bone pain. No constitutional symptoms. 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  No family history of malignancies. 2 brothers with coronary artery disease 2  sisters who are healthy father died of, occasions of coronary disease mother still alive at 87.      PMH: Past Medical History  Diagnosis Date  . Coronary artery disease     s/p CABG; followed by Dr. Bryan Lemma, [facility]  . Mitral valve prolapse   . Allergy   . Hyperlipidemia   . Echocardiogram abnormal 07/25/12    LV mild dilation, mild septal hypertrophy, moderate aortic regurg and mitral regurg, severe L atrium dilation, no regional wall abnormality, LV diastolic function normal; Dr. Bryan Lemma  . Aortic insufficiency   . Erectile dysfunction   . H/O cardiovascular stress test 02/23/10    normal perfusion, no ischemia or infarct; treadmill and Myoview perfusion study; Dr. Lynnea Ferrier  . Pneumococcal vaccine refused 08/15/2012  . Monoclonal gammopathy 09/01/2012    Increased total protein, increased beta globulin peak with decreased gammaglobulin peak on SPEP 08/18/12  IFE not done "possible faint abnormal band"  . Bence Jones proteinuria 09/01/2012    Borderline increase protein 190 mg on 24 hr urine IFE with free kappa light chains (lab normal 50-100 mg)  08/18/12  . Normochromic normocytic anemia 09/01/2012    Hb 12.7, MCV 95  08/18/12 was 13.7 1 year ago; concomitant leukopenia  & increased lymphs  . Leukopenia 09/01/2012    WBC 3,700 42 poly, 48 lymphs, 9 monos 08/18/12  Was 3,700 1 year ago  No history of asthma, emphysema, tuberculosis, diabetes, ulcers, hepatitis, yellow jaundice, malaria, mononucleosis, thyroid disease, seizure, stroke, blood clots, he has some mild arthritis in his hands, no prostate trouble.  Past Surgical History  Procedure Laterality Date  . Coronary artery bypass graft  10/2003    LIMA to LAD, RIMA to OM1, sequential to OM2; Dr. Lynnea Ferrier  . Colonoscopy      ?    Allergies: Allergies  Allergen Reactions  . Toprol Xl (Metoprolol Tartrate)  excessive fatigue     intolerance    Medications: Aspirin 81 mg daily, as the prone  nasal spray twice a day, Plavix 75 mg daily, Vytorin 10-20 one daily, Omega fish WAS 1000 mg 2 daily, Folvite 800 mcg one daily, lisinopril 10 mg daily, Nasonex 50 mcg nasal spray 2 sprays daily, Viagra 100 mg when necessary, Levitra 20 mg daily when necessary,    Social History:  Married and wife accompanies him today. He works Therapist, nutritional for many years. Nonsmoker. No alcohol. 3 healthy children daughter 40, son 52, son 38.   Family History: See history of present illness  Review of Systems: Constitutional symptoms: No constitutional symptoms HEENT: No sore throat Respiratory: No dyspnea Cardiovascular:  No chest pain or palpitations Gastrointestinal ROS: No change in bowel habit Genito-Urinary ROS: No urinary tract symptoms Hematological and Lymphatic: No swollen glands Musculoskeletal: No muscle bone or joint pain although he may be getting some arthritis in his hands Neurologic: No headache or change in vision Dermatologic: No rash or ecchymosis Remaining ROS negative.  Physical Exam: Blood pressure 134/85, pulse 67, temperature 97.8 F (36.6 C), temperature source Oral, resp. rate 18, height 5\' 5"  (1.651 m), weight 168 lb 12.8 oz (76.567 kg). Wt Readings from Last 3 Encounters:  10/01/12 168 lb 12.8 oz (76.567 kg)  09/26/12 170 lb (77.111 kg)  09/24/12 168 lb (76.204 kg)    General appearance: Well-nourished Caucasian man HENNT: Pharynx no erythema or exudate Lymph nodes: No adenopathy Breasts: Lungs: Clear to auscultation resonant to percussion Heart: Regular rhythm. He has a 3/6 systolic murmur left sternal border and a 2/6 systolic murmur second right intercostal space Vascular: Carotids 2+ no bruits Abdominal: Soft, nontender, no mass, no organomegaly GU: Extremities: No edema, no calf tenderness Neurologic: Mental status intact, PERRLA, optic discs sharp vessels normal, motor strength 5 over 5, reflexes 1+ symmetric, sensation intact to vibration over the  fingertips Skin: No rash or ecchymosis    Lab Results: Lab Results  Component Value Date   WBC 3.2* 09/10/2012   HGB 13.0 09/10/2012   HCT 37.8* 09/10/2012   MCV 94.3 09/10/2012   PLT 185 09/10/2012     Chemistry      Component Value Date/Time   NA 137 09/10/2012 1024   NA 140 10/11/2010 0949   K 4.6 09/10/2012 1024   K 4.9 10/11/2010 0949   CL 103 09/10/2012 1024   CL 102 10/11/2010 0949   CO2 25 09/10/2012 1024   CO2 26 10/11/2010 0949   BUN 16.9 09/10/2012 1024   BUN 18 10/11/2010 0949   CREATININE 1.0 09/10/2012 1024   CREATININE 0.89 10/11/2010 0949      Component Value Date/Time   CALCIUM 9.2 09/10/2012 1024   CALCIUM 9.9 10/11/2010 0949   ALKPHOS 31* 09/10/2012 1024   ALKPHOS 27* 10/11/2010 0949   AST 20 09/10/2012 1024   AST 18 10/11/2010 0949   ALT 30 09/10/2012 1024   ALT 21 10/11/2010 0949   BILITOT 0.85 09/10/2012 1024   BILITOT 0.9 10/11/2010 0949       Radiological Studies: Dg Bone Survey Met  10/01/2012  *RADIOLOGY REPORT*  Clinical Data: Monoclonal gammopathy.  Rule out myeloma.  METASTATIC BONE SURVEY  Comparison: None  Findings: The skull is negative for lytic lesion.  There is a probable lytic lesion in the proximal to mid shaft of the left humerus, suspicious for myeloma.  No other suspicious lytic lesions are seen.  No fractures identified.  There is disc degeneration in the lumbar spine, most prominent at L4-5.  Mild disc degeneration and spondylosis at C5-6 and C6-7.  IMPRESSION: Probable  lytic lesion in the left humeral shaft.  No other lytic lesions identified in the skeleton.   Original Report Authenticated By: Janeece Riggers, M.D.       Impression and Plan: IgG kappa monoclonal gammopathy Probable single lytic lesion proximal left humerus. Mild normochromic anemia. IgG paraprotein less than 5 g. Normal albumin, borderline elevation of beta-2 microglobulin, normal renal function and calcium. He likely has early stage multiple myeloma. A probable diagnosis discussed  at length with the patient and his wife and I gave him written notes as we talked.  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  I will have him come back on May 13 and do bone marrow aspiration and biopsy and then have a more thorough discussion of treatment if it is appropriate. I gave him an overview of potential treatment today. More detailed discussion when bone marrow results are available.      Levert Feinstein, MD 10/01/2012, 6:34 PM

## 2012-10-01 NOTE — Telephone Encounter (Signed)
gv and printed appt sched and avs for May....sent pt to radiology

## 2012-10-02 ENCOUNTER — Telehealth: Payer: Self-pay | Admitting: *Deleted

## 2012-10-02 NOTE — Telephone Encounter (Signed)
Message copied by Sabino Snipes on Thu Oct 02, 2012 11:24 AM ------      Message from: Levert Feinstein      Created: Wed Oct 01, 2012  5:42 PM       Call pt: one small area of abnormality in middle of left arm bone otherwise bones normal ------

## 2012-10-02 NOTE — Telephone Encounter (Signed)
Called pt's cell & home #, unable to reach pt but spoke to wife, Cristy Friedlander & report of bone survey given per Dr Patsy Lager request.  Wife states she will pass on this message.

## 2012-10-07 ENCOUNTER — Ambulatory Visit (HOSPITAL_BASED_OUTPATIENT_CLINIC_OR_DEPARTMENT_OTHER): Payer: BC Managed Care – PPO | Admitting: Lab

## 2012-10-07 ENCOUNTER — Other Ambulatory Visit: Payer: Self-pay | Admitting: Oncology

## 2012-10-07 ENCOUNTER — Other Ambulatory Visit (HOSPITAL_COMMUNITY)
Admission: RE | Admit: 2012-10-07 | Discharge: 2012-10-07 | Disposition: A | Discharge status: Home / Self Care | Payer: BC Managed Care – PPO | Source: Ambulatory Visit | Attending: Oncology | Admitting: Oncology

## 2012-10-07 ENCOUNTER — Ambulatory Visit (HOSPITAL_BASED_OUTPATIENT_CLINIC_OR_DEPARTMENT_OTHER): Payer: BC Managed Care – PPO | Admitting: Oncology

## 2012-10-07 VITALS — BP 146/72 | HR 73 | Temp 98.1°F

## 2012-10-07 DIAGNOSIS — D472 Monoclonal gammopathy: Secondary | ICD-10-CM

## 2012-10-07 DIAGNOSIS — C9030 Solitary plasmacytoma not having achieved remission: Secondary | ICD-10-CM | POA: Insufficient documentation

## 2012-10-07 DIAGNOSIS — D649 Anemia, unspecified: Secondary | ICD-10-CM | POA: Insufficient documentation

## 2012-10-07 DIAGNOSIS — D72819 Decreased white blood cell count, unspecified: Secondary | ICD-10-CM | POA: Insufficient documentation

## 2012-10-07 LAB — CBC WITH DIFFERENTIAL/PLATELET
BASO%: 0.5 % (ref 0.0–2.0)
Basophils Absolute: 0 10*3/uL (ref 0.0–0.1)
EOS%: 0.5 % (ref 0.0–7.0)
Eosinophils Absolute: 0 10*3/uL (ref 0.0–0.5)
HCT: 33.9 % — ABNORMAL LOW (ref 38.4–49.9)
HGB: 11.5 g/dL — ABNORMAL LOW (ref 13.0–17.1)
LYMPH%: 37.2 % (ref 14.0–49.0)
MCH: 32.3 pg (ref 27.2–33.4)
MCHC: 34.1 g/dL (ref 32.0–36.0)
MCV: 94.6 fL (ref 79.3–98.0)
MONO#: 0.5 10*3/uL (ref 0.1–0.9)
MONO%: 11.6 % (ref 0.0–14.0)
NEUT#: 1.9 10*3/uL (ref 1.5–6.5)
NEUT%: 50.2 % (ref 39.0–75.0)
Platelets: 209 10*3/uL (ref 140–400)
RBC: 3.58 10*6/uL — ABNORMAL LOW (ref 4.20–5.82)
RDW: 13.4 % (ref 11.0–14.6)
WBC: 3.9 10*3/uL — ABNORMAL LOW (ref 4.0–10.3)
lymph#: 1.4 10*3/uL (ref 0.9–3.3)

## 2012-10-07 LAB — MORPHOLOGY: PLT EST: ADEQUATE

## 2012-10-07 LAB — CHCC SMEAR

## 2012-10-07 LAB — BONE MARROW EXAM: Bone Marrow Exam: 299

## 2012-10-07 NOTE — Progress Notes (Signed)
Bone Marrow Biopsy and Aspiration Procedure Note   Informed consent was obtained and potential risks including bleeding, infection and pain were reviewed with the patient.  Red Rule procedure followed  Posterior iliac crest(s) prepped with Betadine.  Lidocaine 2% local anesthesia infiltrated into the subcutaneous tissue.  Premedication: lorazepam 2 mg PO taken at home  Left bone marrow biopsy and  aspirate was obtained.   The procedure was tolerated well and there were no complications.  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

## 2012-10-07 NOTE — Progress Notes (Signed)
Pt given written and verbal discharge instructions about bone marrow.  Pt verbalized understanding.

## 2012-10-07 NOTE — Patient Instructions (Addendum)
Bone Marrow Aspiration, Bone Marrow Biopsy  Care After  Read the instructions outlined below and refer to this sheet in the next few weeks. These discharge instructions provide you with general information on caring for yourself after you leave the hospital. Your caregiver may also give you specific instructions. While your treatment has been planned according to the most current medical practices available, unavoidable complications occasionally occur. If you have any problems or questions after discharge, call your caregiver.  FINDING OUT THE RESULTS OF YOUR TEST  Not all test results are available during your visit. If your test results are not back during the visit, make an appointment with your caregiver to find out the results. Do not assume everything is normal if you have not heard from your caregiver or the medical facility. It is important for you to follow up on all of your test results.   HOME CARE INSTRUCTIONS   You have had sedation and may be sleepy or dizzy. Your thinking may not be as clear as usual. For the next 24 hours:   Only take over-the-counter or prescription medicines for pain, discomfort, and or fever as directed by your caregiver.   Do not drink alcohol.   Do not smoke.   Do not drive.   Do not make important legal decisions.   Do not operate heavy machinery.   Do not care for small children by yourself.   Keep your dressing clean and dry. You may replace dressing with a bandage after 24 hours.   You may take a bath or shower after 24 hours.   Use an ice pack for 20 minutes every 2 hours while awake for pain as needed.  SEEK MEDICAL CARE IF:    There is redness, swelling, or increasing pain at the biopsy site.   There is pus coming from the biopsy site.   There is drainage from a biopsy site lasting longer than one day.   An unexplained oral temperature above 102 F (38.9 C) develops.  SEEK IMMEDIATE MEDICAL CARE IF:    You develop a rash.   You have difficulty  breathing.   You develop any reaction or side effects to medications given.  Document Released: 12/01/2004 Document Revised: 08/06/2011 Document Reviewed: 05/11/2008  ExitCare Patient Information 2013 [employer].

## 2012-10-13 ENCOUNTER — Telehealth: Payer: Self-pay | Admitting: Oncology

## 2012-10-13 ENCOUNTER — Ambulatory Visit (HOSPITAL_BASED_OUTPATIENT_CLINIC_OR_DEPARTMENT_OTHER): Payer: BC Managed Care – PPO | Admitting: Oncology

## 2012-10-13 VITALS — BP 129/82 | HR 69 | Temp 99.4°F | Resp 18 | Ht 65.0 in | Wt 169.4 lb

## 2012-10-13 DIAGNOSIS — C9000 Multiple myeloma not having achieved remission: Secondary | ICD-10-CM

## 2012-10-13 DIAGNOSIS — D472 Monoclonal gammopathy: Secondary | ICD-10-CM

## 2012-10-13 NOTE — Telephone Encounter (Signed)
Gave pt appt for lab and ML on June 2014 °

## 2012-10-14 ENCOUNTER — Encounter: Payer: Self-pay | Admitting: Oncology

## 2012-10-14 DIAGNOSIS — C9000 Multiple myeloma not having achieved remission: Secondary | ICD-10-CM

## 2012-10-14 HISTORY — DX: Multiple myeloma not having achieved remission: C90.00

## 2012-10-14 NOTE — Progress Notes (Signed)
Hematology and Oncology Follow Up Visit  [name] [id number] [birth year] 52 y.o. 10/14/2012 2:39 PM   Principle Diagnosis: Encounter Diagnoses  Name Primary?  . Monoclonal gammopathy   . Multiple myeloma, without mention of having achieved remission(203.00) Yes     Interim History:   Short interval followup visit for this 52 year old man to discuss results of recent evaluation to rule out multiple myeloma.  He was found to have elevated serum total protein of 10 g percent and mild normochromic anemia with a hemoglobin of 13 (subsequent fall to 11.5 today). Serum immunoglobulins showed elevated total IgG at 4 g with concomitant suppression of IgG A. and IgM. Serum kappa free light chains elevated at 27.9 mg percent, lambda 0.04, ratio 697.5. Beta 2 microglobulin only mildly elevated at 1.84  1.01-1.73). Calcium 9.2 with albumin 3.5. A 24-hour urine showed a total of 190 mg of protein with a questionable faint monoclonal band on immunofixation electrophoresis. A skeletal bone survey done 10/01/2012, shows a "probable lytic lesion in the left humeral shaft." No other lytic lesions identified. I did a bone marrow aspiration and biopsy on May 13. This shows 25% plasma cells in both interstitial distribution and in clusters.   Medications: reviewed  Allergies:  Allergies  Allergen Reactions  . Toprol Xl (Metoprolol Tartrate)     intolerance     Physical Exam: Not repeated today. See 10/01/12 note Blood pressure 129/82, pulse 69, temperature 99.4 F (37.4 C), temperature source Oral, resp. rate 18, height 5\' 5"  (1.651 m), weight 169 lb 6.4 oz (76.839 kg). Wt Readings from Last 3 Encounters:  10/13/12 169 lb 6.4 oz (76.839 kg)  10/01/12 168 lb 12.8 oz (76.567 kg)  09/26/12 170 lb (77.111 kg)     Lab Results: Lab Results  Component Value Date   WBC 3.9* 10/07/2012   HGB 11.5* 10/07/2012   HCT 33.9* 10/07/2012   MCV 94.6 10/07/2012   PLT 209 10/07/2012     Chemistry       Component Value Date/Time   NA 137 09/10/2012 1024   NA 140 10/11/2010 0949   K 4.6 09/10/2012 1024   K 4.9 10/11/2010 0949   CL 103 09/10/2012 1024   CL 102 10/11/2010 0949   CO2 25 09/10/2012 1024   CO2 26 10/11/2010 0949   BUN 16.9 09/10/2012 1024   BUN 18 10/11/2010 0949   CREATININE 1.0 09/10/2012 1024   CREATININE 0.89 10/11/2010 0949      Component Value Date/Time   CALCIUM 9.2 09/10/2012 1024   CALCIUM 9.9 10/11/2010 0949   ALKPHOS 31* 09/10/2012 1024   ALKPHOS 27* 10/11/2010 0949   AST 20 09/10/2012 1024   AST 18 10/11/2010 0949   ALT 30 09/10/2012 1024   ALT 21 10/11/2010 0949   BILITOT 0.85 09/10/2012 1024   BILITOT 0.9 10/11/2010 0949       Radiological Studies: Dg Bone Survey Met  10/01/2012   *RADIOLOGY REPORT*  Clinical Data: Monoclonal gammopathy.  Rule out myeloma.  METASTATIC BONE SURVEY  Comparison: None  Findings: The skull is negative for lytic lesion.  There is a probable lytic lesion in the proximal to mid shaft of the left humerus, suspicious for myeloma.  No other suspicious lytic lesions are seen.  No fractures identified.  There is disc degeneration in the lumbar spine, most prominent at L4-5.  Mild disc degeneration and spondylosis at C5-6 and C6-7.  IMPRESSION: Probable  lytic lesion in the left humeral shaft.  No other  lytic lesions identified in the skeleton.   Original Report Authenticated By: Janeece Riggers, M.D.    Impression: He meets the criteria for stage I IgG kappa multiple myeloma by the Rosanne Gutting classification and good risk category by the international prognostic score which looks just at albumin and beta 2 microglobulin.   I spent about 90 minutes in direct face-to-face contact with the patient and his wife reviewing the diagnosis, prognosis, and current thoughts on best treatment. I reviewed the active drugs for myeloma along with their potential side effects. I discussed participation in clinical trials. He did express interest in a clinical trial  comparing 2 different induction regimens. One regimen contains Velcade and the second contains the second generation proteosome inhibitor kyprolis which outside of a clinical trial is currently only approved for relapsed disease. This has proved to be a very active drug in the relapsed setting and is affording very deep responses. Both induction regimens also include Revlimid and dexamethasone. Following induction, he would be referred for consideration of consolidation therapy with high-dose IV melphalan with autologous stem cell support and then subsequently be put on a maintenance program of low dose Revlimid. He did express interest in getting more information about clinical trials and he will meet with our research nurses this morning to review this. He indicated that he would would like to be treated aggressively. If he agrees with the clinical trial, he'll be randomized to either the Velcade or more the kyprolis arm and I plan to start treatment immediately after that.   CC:. Dr. Sharlot Gowda   Tyler Feinstein, MD 5/20/20142:39 PM

## 2012-10-15 ENCOUNTER — Telehealth: Payer: Self-pay | Admitting: Oncology

## 2012-10-15 ENCOUNTER — Telehealth: Payer: Self-pay | Admitting: *Deleted

## 2012-10-15 ENCOUNTER — Other Ambulatory Visit: Payer: Self-pay | Admitting: *Deleted

## 2012-10-15 ENCOUNTER — Encounter: Payer: Self-pay | Admitting: *Deleted

## 2012-10-15 DIAGNOSIS — C9000 Multiple myeloma not having achieved remission: Secondary | ICD-10-CM

## 2012-10-15 MED ORDER — LENALIDOMIDE 25 MG PO CAPS: 25.0000 mg | ORAL_CAPSULE | Freq: Every day | ORAL | Status: DC

## 2012-10-15 NOTE — Telephone Encounter (Signed)
Call from [place] with [employer] that PA is required for Revlimid. She will be glad to help assist with insurance process pending receiving supporting clinical information. Faxed latest office notes and biopsy reports.

## 2012-10-15 NOTE — Telephone Encounter (Signed)
Called pt left message gave pt appt for chemo class next week

## 2012-10-16 ENCOUNTER — Other Ambulatory Visit (HOSPITAL_COMMUNITY): Payer: Self-pay | Admitting: Oncology

## 2012-10-16 DIAGNOSIS — C9000 Multiple myeloma not having achieved remission: Secondary | ICD-10-CM

## 2012-10-17 ENCOUNTER — Other Ambulatory Visit (HOSPITAL_COMMUNITY): Payer: Self-pay | Admitting: Oncology

## 2012-10-17 LAB — TISSUE HYBRIDIZATION (BONE MARROW)-NCBH

## 2012-10-17 LAB — CHROMOSOME ANALYSIS, BONE MARROW

## 2012-10-22 ENCOUNTER — Other Ambulatory Visit: Payer: Self-pay | Admitting: *Deleted

## 2012-10-22 ENCOUNTER — Telehealth: Payer: Self-pay | Admitting: *Deleted

## 2012-10-22 ENCOUNTER — Other Ambulatory Visit: Payer: BC Managed Care – PPO

## 2012-10-22 ENCOUNTER — Encounter: Payer: Self-pay | Admitting: *Deleted

## 2012-10-22 DIAGNOSIS — C9000 Multiple myeloma not having achieved remission: Secondary | ICD-10-CM

## 2012-10-22 MED ORDER — DEXAMETHASONE 4 MG PO TABS: ORAL_TABLET | ORAL | Status: DC

## 2012-10-22 NOTE — Telephone Encounter (Signed)
No additional note

## 2012-10-23 ENCOUNTER — Encounter: Payer: Self-pay | Admitting: *Deleted

## 2012-10-23 NOTE — Progress Notes (Signed)
RECEIVED A FAX FROM [employer] CONCERNING A CONFIRMATION OF PRESCRIPTION SHIPMENT OF REVLIMID ON 10/22/12.

## 2012-10-24 ENCOUNTER — Ambulatory Visit (HOSPITAL_BASED_OUTPATIENT_CLINIC_OR_DEPARTMENT_OTHER): Payer: BC Managed Care – PPO

## 2012-10-24 ENCOUNTER — Other Ambulatory Visit: Payer: Self-pay | Admitting: *Deleted

## 2012-10-24 ENCOUNTER — Other Ambulatory Visit: Payer: Self-pay | Admitting: Oncology

## 2012-10-24 VITALS — BP 109/71 | HR 61 | Temp 97.2°F | Resp 20

## 2012-10-24 DIAGNOSIS — C9000 Multiple myeloma not having achieved remission: Secondary | ICD-10-CM

## 2012-10-24 DIAGNOSIS — Z5112 Encounter for antineoplastic immunotherapy: Secondary | ICD-10-CM

## 2012-10-24 MED ORDER — PROCHLORPERAZINE MALEATE 5 MG PO TABS: ORAL_TABLET | ORAL | Status: DC

## 2012-10-24 MED ORDER — ONDANSETRON HCL 8 MG PO TABS
8.0000 mg | ORAL_TABLET | Freq: Once | ORAL | Status: AC
  Administered 2012-10-24: 8 mg via ORAL

## 2012-10-24 MED ORDER — ACYCLOVIR 400 MG PO TABS: 400.0000 mg | ORAL_TABLET | Freq: Two times a day (BID) | ORAL | Status: DC

## 2012-10-24 MED ORDER — LENALIDOMIDE 5 MG PO CAPS: 25.0000 mg | ORAL_CAPSULE | Freq: Every day | ORAL | Status: DC

## 2012-10-24 MED ORDER — BORTEZOMIB CHEMO SQ INJECTION 3.5 MG (2.5MG/ML)
1.5000 mg/m2 | Freq: Once | INTRAMUSCULAR | Status: AC
  Administered 2012-10-24: 2.75 mg via SUBCUTANEOUS
  Filled 2012-10-24: qty 2.75

## 2012-10-24 NOTE — Patient Instructions (Addendum)
[facility] Discharge Instructions for Patients Receiving Chemotherapy  Today you received the following chemotherapy agents Velcade  To help prevent nausea and vomiting after your treatment, we encourage you to take your nausea medication Compazine Begin taking it at 8pm and take it as often as prescribed for the next 24-36 hours.   If you develop nausea and vomiting that is not controlled by your nausea medication, call the clinic. If it is after clinic hours your family physician or the after hours number for the clinic or go to the Emergency Department.   BELOW ARE SYMPTOMS THAT SHOULD BE REPORTED IMMEDIATELY:  *FEVER GREATER THAN 100.5 F  *CHILLS WITH OR WITHOUT FEVER  NAUSEA AND VOMITING THAT IS NOT CONTROLLED WITH YOUR NAUSEA MEDICATION  *UNUSUAL SHORTNESS OF BREATH  *UNUSUAL BRUISING OR BLEEDING  TENDERNESS IN MOUTH AND THROAT WITH OR WITHOUT PRESENCE OF ULCERS  *URINARY PROBLEMS  *BOWEL PROBLEMS  UNUSUAL RASH Items with * indicate a potential emergency and should be followed up as soon as possible.  One of the nurses will contact you 24 hours after your treatment. Please let the nurse know about any problems that you may have experienced. Feel free to call the clinic you have any questions or concerns. The clinic phone number is [phone].   I have been informed and understand all the instructions given to me. I know to contact the clinic, my physician, or go to the Emergency Department if any problems should occur. I do not have any questions at this time, but understand that I may call the clinic during office hours   should I have any questions or need assistance in obtaining follow up care.     Bortezomib injection  (Velcade) What is this medicine? BORTEZOMIB (bor TEZ oh mib) is a chemotherapy drug. It slows the growth of cancer cells. This medicine is used to treat multiple myeloma, lymphoma, and other cancers. This medicine may be used for  other purposes; ask your health care provider or pharmacist if you have questions. What should I tell my health care provider before I take this medicine? They need to know if you have any of these conditions: -heart disease -irregular heartbeat -liver disease -low blood counts, like low white blood cells, platelets, or hemoglobin -peripheral neuropathy -taking medicine for blood pressure -an unusual or allergic reaction to bortezomib, mannitol, boron, other medicines, foods, dyes, or preservatives -pregnant or trying to get pregnant -breast-feeding How should I use this medicine? This medicine is for injection into a vein or for injection under the skin. It is given by a health care professional in a hospital or clinic setting. Talk to your pediatrician regarding the use of this medicine in children. Special care may be needed. Overdosage: If you think you have taken too much of this medicine contact a poison control center or emergency room at once. NOTE: This medicine is only for you. Do not share this medicine with others. What if I miss a dose? It is important not to miss your dose. Call your doctor or health care professional if you are unable to keep an appointment. What may interact with this medicine? -medicines for diabetes -medicines to increase blood counts like filgrastim, pegfilgrastim, sargramostim -zalcitabine Talk to your doctor or health care professional before taking any of these medicines: -acetaminophen -aspirin -ibuprofen -ketoprofen -naproxen This list may not describe all possible interactions. Give your health care provider a list of all the medicines, herbs, non-prescription drugs, or dietary supplements  you use. Also tell them if you smoke, drink alcohol, or use illegal drugs. Some items may interact with your medicine. What should I watch for while using this medicine? Visit your doctor for checks on your progress. This drug may make you feel generally  unwell. This is not uncommon, as chemotherapy can affect healthy cells as well as cancer cells. Report any side effects. Continue your course of treatment even though you feel ill unless your doctor tells you to stop. You may get drowsy or dizzy. Do not drive, use machinery, or do anything that needs mental alertness until you know how this medicine affects you. Do not stand or sit up quickly, especially if you are an older patient. This reduces the risk of dizzy or fainting spells. In some cases, you may be given additional medicines to help with side effects. Follow all directions for their use. Call your doctor or health care professional for advice if you get a fever, chills or sore throat, or other symptoms of a cold or flu. Do not treat yourself. This drug decreases your body's ability to fight infections. Try to avoid being around people who are sick. This medicine may increase your risk to bruise or bleed. Call your doctor or health care professional if you notice any unusual bleeding. Be careful brushing and flossing your teeth or using a toothpick because you may get an infection or bleed more easily. If you have any dental work done, tell your dentist you are receiving this medicine. Avoid taking products that contain aspirin, acetaminophen, ibuprofen, naproxen, or ketoprofen unless instructed by your doctor. These medicines may hide a fever. Do not become pregnant while taking this medicine. Women should inform their doctor if they wish to become pregnant or think they might be pregnant. There is a potential for serious side effects to an unborn child. Talk to your health care professional or pharmacist for more information. Do not breast-feed an infant while taking this medicine. You may have vomiting or diarrhea while taking this medicine. Drink water or other fluids as directed. What side effects may I notice from receiving this medicine? Side effects that you should report to your doctor or  health care professional as soon as possible: -allergic reactions like skin rash, itching or hives, swelling of the face, lips, or tongue -breathing problems -changes in hearing -changes in vision -fast, irregular heartbeat -feeling faint or lightheaded, falls -pain, tingling, numbness in the hands or feet -seizures -swelling of the ankles, feet, hands -unusual bleeding or bruising -unusually weak or tired -vomiting Side effects that usually do not require medical attention (report to your doctor or health care professional if they continue or are bothersome): -changes in emotions or moods -constipation -diarrhea -loss of appetite -headache -irritation at site where injected -nausea This list may not describe all possible side effects. Call your doctor for medical advice about side effects. You may report side effects to FDA at 1-800-FDA-1088. Where should I keep my medicine? This drug is given in a hospital or clinic and will not be stored at home. NOTE: This sheet is a summary. It may not cover all possible information. If you have questions about this medicine, talk to your doctor, pharmacist, or health care provider.  2012, [employer]vier/Gold Standard. (06/21/2010 11:42:36 AM)  Prochlorperazine tablets (Compazine) What is this medicine? PROCHLORPERAZINE (proe klor PER a zeen) helps to control severe nausea and vomiting. This medicine is also used to treat schizophrenia. It can also help patients who experience anxiety  that is not due to psychological illness. This medicine may be used for other purposes; ask your health care provider or pharmacist if you have questions. What should I tell my health care provider before I take this medicine? They need to know if you have any of these conditions: -blood disorders or disease -dementia -liver disease or jaundice -Parkinson's disease -uncontrollable movement disorder -an unusual or allergic reaction to prochlorperazine, other  medicines, foods, dyes, or preservatives -pregnant or trying to get pregnant -breast-feeding How should I use this medicine? Take this medicine by mouth with a glass of water. Follow the directions on the prescription label. Take your doses at regular intervals. Do not take your medicine more often than directed. Do not stop taking this medicine suddenly. This can cause nausea, vomiting, and dizziness. Ask your doctor or health care professional for advice. Talk to your pediatrician regarding the use of this medicine in children. Special care may be needed. While this drug may be prescribed for children as young as 2 years for selected conditions, precautions do apply. Overdosage: If you think you have taken too much of this medicine contact a poison control center or emergency room at once. NOTE: This medicine is only for you. Do not share this medicine with others. What if I miss a dose? If you miss a dose, take it as soon as you can. If it is almost time for your next dose, take only that dose. Do not take double or extra doses. What may interact with this medicine? Do not take this medicine with any of the following medications: -amoxapine -antidepressants like citalopram, escitalopram, fluoxetine, paroxetine, and sertraline -deferoxamine -dofetilide -maprotiline -tricyclic antidepressants like amitriptyline, clomipramine, imipramine, nortiptyline and others This medicine may also interact with the following medications: -lithium -medicines for pain -phenytoin -propranolol -warfarin This list may not describe all possible interactions. Give your health care provider a list of all the medicines, herbs, non-prescription drugs, or dietary supplements you use. Also tell them if you smoke, drink alcohol, or use illegal drugs. Some items may interact with your medicine. What should I watch for while using this medicine? Visit your doctor or health care professional for regular checks on your  progress. You may get drowsy or dizzy. Do not drive, use machinery, or do anything that needs mental alertness until you know how this medicine affects you. Do not stand or sit up quickly, especially if you are an older patient. This reduces the risk of dizzy or fainting spells. Alcohol may interfere with the effect of this medicine. Avoid alcoholic drinks. This medicine can reduce the response of your body to heat or cold. Try not to get overheated. Avoid temperature extremes, such as saunas, hot tubs, or very hot or cold baths or showers. Dress warmly in cold weather. This medicine can make you more sensitive to the sun. Keep out of the sun. If you cannot avoid being in the sun, wear protective clothing and use sunscreen. Do not use sun lamps or tanning beds/booths. Your mouth may get dry. Chewing sugarless gum or sucking hard candy, and drinking plenty of water may help. Contact your doctor if the problem does not go away or is severe. What side effects may I notice from receiving this medicine? Side effects that you should report to your doctor or health care professional as soon as possible: -blurred vision -breast enlargement in men or women -breast milk in women who are not breast-feeding -chest pain, fast or irregular heartbeat -confusion, restlessness -dark  yellow or brown urine -difficulty breathing or swallowing -dizziness or fainting spells -drooling, shaking, movement difficulty (shuffling walk) or rigidity -fever, chills, sore throat -involuntary or uncontrollable movements of the eyes, mouth, head, arms, and legs -seizures -stomach area pain -unusually weak or tired -unusual bleeding or bruising -yellowing of skin or eyes Side effects that usually do not require medical attention (report to your doctor or health care professional if they continue or are bothersome): -difficulty passing urine -difficulty sleeping -headache -sexual dysfunction -skin rash, or itching This  list may not describe all possible side effects. Call your doctor for medical advice about side effects. You may report side effects to FDA at 1-800-FDA-1088. Where should I keep my medicine? Keep out of the reach of children. Store at room temperature between 15 and 30 degrees C (59 and 86 degrees F). Protect from light. Throw away any unused medicine after the expiration date. NOTE: This sheet is a summary. It may not cover all possible information. If you have questions about this medicine, talk to your doctor, pharmacist, or health care provider.  2012, Elsevier/Gold Standard. (12/15/2007 4:26:50 PM)

## 2012-10-27 ENCOUNTER — Telehealth: Payer: Self-pay | Admitting: *Deleted

## 2012-10-27 ENCOUNTER — Other Ambulatory Visit: Payer: Self-pay | Admitting: *Deleted

## 2012-10-27 DIAGNOSIS — C9000 Multiple myeloma not having achieved remission: Secondary | ICD-10-CM

## 2012-10-27 MED ORDER — PROCHLORPERAZINE MALEATE 5 MG PO TABS: ORAL_TABLET | ORAL | Status: DC

## 2012-10-27 NOTE — Telephone Encounter (Signed)
Patient is tired/fatigued.  Reports last bowel movement was 5-31--2014 but they are small and constipated.  Drinking water and taking stool softeners.  Passing gas.  Suggested hot beverages, light walking and to increase softeners.  Asked that he call if reaches three days with no movement.  He took the dexamethasone 10 mg twice on 10-24-2012 and will need a refill earlier than expected.

## 2012-10-27 NOTE — Telephone Encounter (Signed)
Message copied by Augusto Garbe on Mon Oct 27, 2012  6:08 PM ------      Message from: Kallie Locks      Created: Fri Oct 24, 2012 11:44 AM      Regarding: "1st time chemotherapy"      Contact: [phone]       Patient received 1st time chemotherapy today, per Dr. Cyndie Chime; tolerated treatment well. ------

## 2012-10-28 ENCOUNTER — Other Ambulatory Visit: Payer: Self-pay | Admitting: *Deleted

## 2012-10-28 DIAGNOSIS — F411 Generalized anxiety disorder: Secondary | ICD-10-CM

## 2012-10-28 MED ORDER — LORAZEPAM 2 MG PO TABS: ORAL_TABLET | ORAL | Status: DC

## 2012-10-31 ENCOUNTER — Ambulatory Visit (HOSPITAL_BASED_OUTPATIENT_CLINIC_OR_DEPARTMENT_OTHER): Payer: BC Managed Care – PPO

## 2012-10-31 ENCOUNTER — Ambulatory Visit (HOSPITAL_BASED_OUTPATIENT_CLINIC_OR_DEPARTMENT_OTHER): Payer: BC Managed Care – PPO | Admitting: Lab

## 2012-10-31 VITALS — BP 112/66 | HR 72 | Temp 98.4°F

## 2012-10-31 DIAGNOSIS — D472 Monoclonal gammopathy: Secondary | ICD-10-CM

## 2012-10-31 DIAGNOSIS — C9000 Multiple myeloma not having achieved remission: Secondary | ICD-10-CM

## 2012-10-31 DIAGNOSIS — Z5112 Encounter for antineoplastic immunotherapy: Secondary | ICD-10-CM

## 2012-10-31 LAB — COMPREHENSIVE METABOLIC PANEL (CC13)
ALT: 29 U/L (ref 0–55)
AST: 17 U/L (ref 5–34)
Albumin: 3.3 g/dL — ABNORMAL LOW (ref 3.5–5.0)
Alkaline Phosphatase: 33 U/L — ABNORMAL LOW (ref 40–150)
BUN: 14.7 mg/dL (ref 7.0–26.0)
CO2: 26 mEq/L (ref 22–29)
Calcium: 8.9 mg/dL (ref 8.4–10.4)
Chloride: 103 mEq/L (ref 98–107)
Creatinine: 0.8 mg/dL (ref 0.7–1.3)
Glucose: 100 mg/dl — ABNORMAL HIGH (ref 70–99)
Potassium: 4.4 mEq/L (ref 3.5–5.1)
Sodium: 137 mEq/L (ref 136–145)
Total Bilirubin: 1.12 mg/dL (ref 0.20–1.20)
Total Protein: 9.3 g/dL — ABNORMAL HIGH (ref 6.4–8.3)

## 2012-10-31 LAB — CBC WITH DIFFERENTIAL/PLATELET
BASO%: 0.2 % (ref 0.0–2.0)
Basophils Absolute: 0 10*3/uL (ref 0.0–0.1)
EOS%: 0.7 % (ref 0.0–7.0)
Eosinophils Absolute: 0 10*3/uL (ref 0.0–0.5)
HCT: 36.5 % — ABNORMAL LOW (ref 38.4–49.9)
HGB: 12.6 g/dL — ABNORMAL LOW (ref 13.0–17.1)
LYMPH%: 15.3 % (ref 14.0–49.0)
MCH: 32.8 pg (ref 27.2–33.4)
MCHC: 34.5 g/dL (ref 32.0–36.0)
MCV: 95.1 fL (ref 79.3–98.0)
MONO#: 0.1 10*3/uL (ref 0.1–0.9)
MONO%: 2.7 % (ref 0.0–14.0)
NEUT#: 3.7 10*3/uL (ref 1.5–6.5)
NEUT%: 81.1 % — ABNORMAL HIGH (ref 39.0–75.0)
Platelets: 162 10*3/uL (ref 140–400)
RBC: 3.84 10*6/uL — ABNORMAL LOW (ref 4.20–5.82)
RDW: 13.8 % (ref 11.0–14.6)
WBC: 4.5 10*3/uL (ref 4.0–10.3)
lymph#: 0.7 10*3/uL — ABNORMAL LOW (ref 0.9–3.3)
nRBC: 0 % (ref 0–0)

## 2012-10-31 MED ORDER — BORTEZOMIB CHEMO SQ INJECTION 3.5 MG (2.5MG/ML)
1.5000 mg/m2 | Freq: Once | INTRAMUSCULAR | Status: AC
  Administered 2012-10-31: 2.75 mg via SUBCUTANEOUS
  Filled 2012-10-31: qty 2.75

## 2012-10-31 MED ORDER — ONDANSETRON HCL 8 MG PO TABS
8.0000 mg | ORAL_TABLET | Freq: Once | ORAL | Status: AC
  Administered 2012-10-31: 8 mg via ORAL

## 2012-10-31 NOTE — Patient Instructions (Addendum)
[facility] Discharge Instructions for Patients Receiving Chemotherapy  Today you received the following chemotherapy agents velcade  To help prevent nausea and vomiting after your treatment, we encourage you to take your nausea medication as needed   If you develop nausea and vomiting that is not controlled by your nausea medication, call the clinic.   BELOW ARE SYMPTOMS THAT SHOULD BE REPORTED IMMEDIATELY:  *FEVER GREATER THAN 100.5 F  *CHILLS WITH OR WITHOUT FEVER  NAUSEA AND VOMITING THAT IS NOT CONTROLLED WITH YOUR NAUSEA MEDICATION  *UNUSUAL SHORTNESS OF BREATH  *UNUSUAL BRUISING OR BLEEDING  TENDERNESS IN MOUTH AND THROAT WITH OR WITHOUT PRESENCE OF ULCERS  *URINARY PROBLEMS  *BOWEL PROBLEMS  UNUSUAL RASH Items with * indicate a potential emergency and should be followed up as soon as possible.  Feel free to call the clinic you have any questions or concerns. The clinic phone number is [phone].    

## 2012-11-03 ENCOUNTER — Ambulatory Visit (HOSPITAL_BASED_OUTPATIENT_CLINIC_OR_DEPARTMENT_OTHER): Payer: BC Managed Care – PPO | Admitting: Nurse Practitioner

## 2012-11-03 ENCOUNTER — Other Ambulatory Visit: Payer: Self-pay

## 2012-11-03 ENCOUNTER — Telehealth: Payer: Self-pay | Admitting: Oncology

## 2012-11-03 VITALS — BP 127/80 | HR 63 | Temp 97.6°F | Resp 18 | Ht 65.0 in | Wt 170.1 lb

## 2012-11-03 DIAGNOSIS — C9000 Multiple myeloma not having achieved remission: Secondary | ICD-10-CM

## 2012-11-03 DIAGNOSIS — I059 Rheumatic mitral valve disease, unspecified: Secondary | ICD-10-CM

## 2012-11-03 DIAGNOSIS — I10 Essential (primary) hypertension: Secondary | ICD-10-CM

## 2012-11-03 NOTE — Progress Notes (Signed)
OFFICE PROGRESS NOTE  Interval history:  [name] is a 52 year old man recently diagnosed with IgG kappa multiple myeloma presenting with elevation of serum total protein and a mild normochromic anemia. Serum immunoglobulins showed elevation of IgG with concomitant suppression of IgA and IgM. Serum kappa free light chains elevated at 27.9, lambda 0.04, ratio 697.5. Beta-2 microglobulin was mildly elevated at 1.84. Calcium 9.2 with albumin 3.5. 24 urine showed a total of 190 mg of protein with a questionable faint monoclonal band on immunofixation electrophoresis. Skeletal bone survey 10/01/2012 showed a "probable lytic lesion in the left humeral shaft". No other lytic lesions identified. Bone marrow aspiration and biopsy on 10/07/2012 showed 25% plasma cells. He began Velcade 1.5 mg per meter squared squared weekly on 10/24/2012 and Revlimid 25 mg daily for 21 days followed by a 7 day break on 10/24/2012. He takes dexamethasone 20 mg twice daily each week on the day he gets Velcade.  He is seen today for scheduled followup. He noted fatigue following the first Velcade treatment. He did not note fatigue following the second treatment. No nausea or vomiting. No mouth sores. No diarrhea. No numbness or tingling in his hands or feet. He denies shortness of breath and chest pain. No leg swelling or calf pain.   Objective: Blood pressure 127/80, pulse 63, temperature 97.6 F (36.4 C), temperature source Oral, resp. rate 18, height 5\' 5"  (1.651 m), weight 170 lb 1.6 oz (77.157 kg).  Oropharynx is without thrush or ulceration. No palpable cervical or supraclavicular lymph nodes. Lungs are clear. No wheezes or rales. Regular cardiac rhythm. 2-3/6 systolic murmur. Abdomen is soft and nontender. No organomegaly. Extremities are without edema. Calves soft and nontender. Motor strength 5 over 5. Knee DTRs 1+, symmetric. Vibratory sense intact over the fingertips per tuning fork exam.  Lab Results: Lab Results   Component Value Date   WBC 4.5 10/31/2012   HGB 12.6* 10/31/2012   HCT 36.5* 10/31/2012   MCV 95.1 10/31/2012   PLT 162 10/31/2012    Chemistry:    Chemistry      Component Value Date/Time   NA 137 10/31/2012 1116   NA 140 10/11/2010 0949   K 4.4 10/31/2012 1116   K 4.9 10/11/2010 0949   CL 103 10/31/2012 1116   CL 102 10/11/2010 0949   CO2 26 10/31/2012 1116   CO2 26 10/11/2010 0949   BUN 14.7 10/31/2012 1116   BUN 18 10/11/2010 0949   CREATININE 0.8 10/31/2012 1116   CREATININE 0.89 10/11/2010 0949      Component Value Date/Time   CALCIUM 8.9 10/31/2012 1116   CALCIUM 9.9 10/11/2010 0949   ALKPHOS 33* 10/31/2012 1116   ALKPHOS 27* 10/11/2010 0949   AST 17 10/31/2012 1116   AST 18 10/11/2010 0949   ALT 29 10/31/2012 1116   ALT 21 10/11/2010 0949   BILITOT 1.12 10/31/2012 1116   BILITOT 0.9 10/11/2010 0949       Studies/Results: No results found.  Medications: I have reviewed the patient's current medications.  Assessment/Plan:  1. IgG kappa multiple myeloma. He began treatment with RVD on 10/24/2012. 2. CAD status post CABG 2005. 3. Mitral valve prolapse. 4. Hypertension.  Disposition-he appears stable. Plan to continue RVD. Dr. Cyndie Chime will see him in followup on 12/05/2012. [name] will contact the office prior to his next visit with any problems.  Plan reviewed with Dr. Cyndie Chime.  Lonna Cobb ANP/GNP-BC

## 2012-11-03 NOTE — Telephone Encounter (Signed)
gv pt appt schedule/AVS for June thru July.

## 2012-11-07 ENCOUNTER — Ambulatory Visit (HOSPITAL_BASED_OUTPATIENT_CLINIC_OR_DEPARTMENT_OTHER): Payer: BC Managed Care – PPO

## 2012-11-07 ENCOUNTER — Other Ambulatory Visit (HOSPITAL_BASED_OUTPATIENT_CLINIC_OR_DEPARTMENT_OTHER): Payer: BC Managed Care – PPO | Admitting: Lab

## 2012-11-07 VITALS — BP 110/68 | HR 65 | Temp 97.9°F | Resp 18

## 2012-11-07 DIAGNOSIS — C9000 Multiple myeloma not having achieved remission: Secondary | ICD-10-CM

## 2012-11-07 DIAGNOSIS — Z5112 Encounter for antineoplastic immunotherapy: Secondary | ICD-10-CM

## 2012-11-07 LAB — CBC WITH DIFFERENTIAL/PLATELET
BASO%: 0.2 % (ref 0.0–2.0)
Basophils Absolute: 0 10*3/uL (ref 0.0–0.1)
EOS%: 1.2 % (ref 0.0–7.0)
Eosinophils Absolute: 0 10*3/uL (ref 0.0–0.5)
HCT: 36 % — ABNORMAL LOW (ref 38.4–49.9)
HGB: 12.8 g/dL — ABNORMAL LOW (ref 13.0–17.1)
LYMPH%: 20 % (ref 14.0–49.0)
MCH: 33.9 pg — ABNORMAL HIGH (ref 27.2–33.4)
MCHC: 35.5 g/dL (ref 32.0–36.0)
MCV: 95.5 fL (ref 79.3–98.0)
MONO#: 0.4 10*3/uL (ref 0.1–0.9)
MONO%: 9.6 % (ref 0.0–14.0)
NEUT#: 2.8 10*3/uL (ref 1.5–6.5)
NEUT%: 69 % (ref 39.0–75.0)
Platelets: 187 10*3/uL (ref 140–400)
RBC: 3.77 10*6/uL — ABNORMAL LOW (ref 4.20–5.82)
RDW: 14.1 % (ref 11.0–14.6)
WBC: 4.1 10*3/uL (ref 4.0–10.3)
lymph#: 0.8 10*3/uL — ABNORMAL LOW (ref 0.9–3.3)

## 2012-11-07 MED ORDER — ONDANSETRON HCL 8 MG PO TABS
8.0000 mg | ORAL_TABLET | Freq: Once | ORAL | Status: AC
  Administered 2012-11-07: 8 mg via ORAL

## 2012-11-07 MED ORDER — BORTEZOMIB CHEMO SQ INJECTION 3.5 MG (2.5MG/ML)
1.5000 mg/m2 | Freq: Once | INTRAMUSCULAR | Status: AC
  Administered 2012-11-07: 2.75 mg via SUBCUTANEOUS
  Filled 2012-11-07: qty 2.75

## 2012-11-07 NOTE — Patient Instructions (Addendum)
[facility] Discharge Instructions for Patients Receiving Chemotherapy  Today you received the following chemotherapy agents: Velcade.  To help prevent nausea and vomiting after your treatment, we encourage you to take your nausea medication as prescribed.   If you develop nausea and vomiting that is not controlled by your nausea medication, call the clinic.   BELOW ARE SYMPTOMS THAT SHOULD BE REPORTED IMMEDIATELY:  *FEVER GREATER THAN 100.5 F  *CHILLS WITH OR WITHOUT FEVER  NAUSEA AND VOMITING THAT IS NOT CONTROLLED WITH YOUR NAUSEA MEDICATION  *UNUSUAL SHORTNESS OF BREATH  *UNUSUAL BRUISING OR BLEEDING  TENDERNESS IN MOUTH AND THROAT WITH OR WITHOUT PRESENCE OF ULCERS  *URINARY PROBLEMS  *BOWEL PROBLEMS  UNUSUAL RASH Items with * indicate a potential emergency and should be followed up as soon as possible.  Feel free to call the clinic you have any questions or concerns. The clinic phone number is [phone].    

## 2012-11-14 ENCOUNTER — Other Ambulatory Visit: Payer: Self-pay | Admitting: *Deleted

## 2012-11-14 ENCOUNTER — Ambulatory Visit (HOSPITAL_BASED_OUTPATIENT_CLINIC_OR_DEPARTMENT_OTHER): Payer: BC Managed Care – PPO

## 2012-11-14 ENCOUNTER — Other Ambulatory Visit (HOSPITAL_BASED_OUTPATIENT_CLINIC_OR_DEPARTMENT_OTHER): Payer: BC Managed Care – PPO

## 2012-11-14 VITALS — BP 115/70 | HR 64 | Temp 98.6°F

## 2012-11-14 DIAGNOSIS — C9000 Multiple myeloma not having achieved remission: Secondary | ICD-10-CM

## 2012-11-14 DIAGNOSIS — Z5112 Encounter for antineoplastic immunotherapy: Secondary | ICD-10-CM

## 2012-11-14 LAB — CBC WITH DIFFERENTIAL/PLATELET
BASO%: 0.6 % (ref 0.0–2.0)
Basophils Absolute: 0 10*3/uL (ref 0.0–0.1)
EOS%: 2.6 % (ref 0.0–7.0)
Eosinophils Absolute: 0.1 10*3/uL (ref 0.0–0.5)
HCT: 34.9 % — ABNORMAL LOW (ref 38.4–49.9)
HGB: 12.2 g/dL — ABNORMAL LOW (ref 13.0–17.1)
LYMPH%: 35.5 % (ref 14.0–49.0)
MCH: 33.2 pg (ref 27.2–33.4)
MCHC: 34.9 g/dL (ref 32.0–36.0)
MCV: 95 fL (ref 79.3–98.0)
MONO#: 0.3 10*3/uL (ref 0.1–0.9)
MONO%: 11.7 % (ref 0.0–14.0)
NEUT#: 1.2 10*3/uL — ABNORMAL LOW (ref 1.5–6.5)
NEUT%: 49.6 % (ref 39.0–75.0)
Platelets: 162 10*3/uL (ref 140–400)
RBC: 3.67 10*6/uL — ABNORMAL LOW (ref 4.20–5.82)
RDW: 14.4 % (ref 11.0–14.6)
WBC: 2.5 10*3/uL — ABNORMAL LOW (ref 4.0–10.3)
lymph#: 0.9 10*3/uL (ref 0.9–3.3)

## 2012-11-14 MED ORDER — ONDANSETRON HCL 8 MG PO TABS
8.0000 mg | ORAL_TABLET | Freq: Once | ORAL | Status: AC
  Administered 2012-11-14: 8 mg via ORAL

## 2012-11-14 MED ORDER — BORTEZOMIB CHEMO SQ INJECTION 3.5 MG (2.5MG/ML)
1.5000 mg/m2 | Freq: Once | INTRAMUSCULAR | Status: AC
  Administered 2012-11-14: 2.75 mg via SUBCUTANEOUS
  Filled 2012-11-14: qty 2.75

## 2012-11-14 NOTE — Progress Notes (Signed)
Ok to treat with ANC 1.2 per Dr. Sherrill.   

## 2012-11-14 NOTE — Telephone Encounter (Signed)
THIS REFILL REQUEST FOR REVLIMID WAS PLACED IN DR.GRANFORTUNA'S ACTIVE WORK BOX. 

## 2012-11-14 NOTE — Patient Instructions (Signed)
[facility] Discharge Instructions for Patients Receiving Chemotherapy  Today you received the following chemotherapy agents: Velcade.  To help prevent nausea and vomiting after your treatment, we encourage you to take your nausea medication as prescribed.   If you develop nausea and vomiting that is not controlled by your nausea medication, call the clinic.   BELOW ARE SYMPTOMS THAT SHOULD BE REPORTED IMMEDIATELY:  *FEVER GREATER THAN 100.5 F  *CHILLS WITH OR WITHOUT FEVER  NAUSEA AND VOMITING THAT IS NOT CONTROLLED WITH YOUR NAUSEA MEDICATION  *UNUSUAL SHORTNESS OF BREATH  *UNUSUAL BRUISING OR BLEEDING  TENDERNESS IN MOUTH AND THROAT WITH OR WITHOUT PRESENCE OF ULCERS  *URINARY PROBLEMS  *BOWEL PROBLEMS  UNUSUAL RASH Items with * indicate a potential emergency and should be followed up as soon as possible.  Feel free to call the clinic you have any questions or concerns. The clinic phone number is [phone].    

## 2012-11-17 ENCOUNTER — Other Ambulatory Visit: Payer: Self-pay | Admitting: *Deleted

## 2012-11-17 DIAGNOSIS — C9000 Multiple myeloma not having achieved remission: Secondary | ICD-10-CM

## 2012-11-17 MED ORDER — LENALIDOMIDE 25 MG PO CAPS: 25.0000 mg | ORAL_CAPSULE | Freq: Every day | ORAL | Status: DC

## 2012-11-17 NOTE — Telephone Encounter (Signed)
Called patient with instructions to take patient survey.  [employer] number given at this time.

## 2012-11-17 NOTE — Telephone Encounter (Signed)
[facility] [employer] [facility] faxed confirmation of facsimile receipt for Revlimid prescription referral.  Patient support will provide this medicine through [employer] and make delivery arrangements with patient.

## 2012-11-20 NOTE — Telephone Encounter (Signed)
[facility] sent facsimile confirmation of Revlimid prescription shipment.  Was shipped 11-17-2012 with next business day delivery.

## 2012-11-21 ENCOUNTER — Other Ambulatory Visit (HOSPITAL_BASED_OUTPATIENT_CLINIC_OR_DEPARTMENT_OTHER): Payer: BC Managed Care – PPO | Admitting: Lab

## 2012-11-21 ENCOUNTER — Ambulatory Visit (HOSPITAL_BASED_OUTPATIENT_CLINIC_OR_DEPARTMENT_OTHER): Payer: BC Managed Care – PPO

## 2012-11-21 VITALS — Temp 97.8°F

## 2012-11-21 DIAGNOSIS — C9000 Multiple myeloma not having achieved remission: Secondary | ICD-10-CM

## 2012-11-21 DIAGNOSIS — Z5112 Encounter for antineoplastic immunotherapy: Secondary | ICD-10-CM

## 2012-11-21 LAB — CBC WITH DIFFERENTIAL/PLATELET
BASO%: 0.8 % (ref 0.0–2.0)
Basophils Absolute: 0 10*3/uL (ref 0.0–0.1)
EOS%: 0.2 % (ref 0.0–7.0)
Eosinophils Absolute: 0 10*3/uL (ref 0.0–0.5)
HCT: 37.1 % — ABNORMAL LOW (ref 38.4–49.9)
HGB: 13 g/dL (ref 13.0–17.1)
LYMPH%: 27.6 % (ref 14.0–49.0)
MCH: 33.6 pg — ABNORMAL HIGH (ref 27.2–33.4)
MCHC: 35 g/dL (ref 32.0–36.0)
MCV: 96.2 fL (ref 79.3–98.0)
MONO#: 0.2 10*3/uL (ref 0.1–0.9)
MONO%: 9.3 % (ref 0.0–14.0)
NEUT#: 1.4 10*3/uL — ABNORMAL LOW (ref 1.5–6.5)
NEUT%: 62.1 % (ref 39.0–75.0)
Platelets: 172 10*3/uL (ref 140–400)
RBC: 3.86 10*6/uL — ABNORMAL LOW (ref 4.20–5.82)
RDW: 14.5 % (ref 11.0–14.6)
WBC: 2.2 10*3/uL — ABNORMAL LOW (ref 4.0–10.3)
lymph#: 0.6 10*3/uL — ABNORMAL LOW (ref 0.9–3.3)

## 2012-11-21 MED ORDER — ONDANSETRON HCL 8 MG PO TABS
8.0000 mg | ORAL_TABLET | Freq: Once | ORAL | Status: AC
  Administered 2012-11-21: 8 mg via ORAL

## 2012-11-21 MED ORDER — BORTEZOMIB CHEMO SQ INJECTION 3.5 MG (2.5MG/ML)
1.5000 mg/m2 | Freq: Once | INTRAMUSCULAR | Status: AC
  Administered 2012-11-21: 2.75 mg via SUBCUTANEOUS
  Filled 2012-11-21: qty 2.75

## 2012-11-21 NOTE — Progress Notes (Signed)
Ok to treat per Dr. Granfortuna. 

## 2012-11-21 NOTE — Patient Instructions (Addendum)
[facility] Discharge Instructions for Patients Receiving Chemotherapy  Today you received the following chemotherapy agents: Velcade.  To help prevent nausea and vomiting after your treatment, we encourage you to take your nausea medication as prescribed.   If you develop nausea and vomiting that is not controlled by your nausea medication, call the clinic.   BELOW ARE SYMPTOMS THAT SHOULD BE REPORTED IMMEDIATELY:  *FEVER GREATER THAN 100.5 F  *CHILLS WITH OR WITHOUT FEVER  NAUSEA AND VOMITING THAT IS NOT CONTROLLED WITH YOUR NAUSEA MEDICATION  *UNUSUAL SHORTNESS OF BREATH  *UNUSUAL BRUISING OR BLEEDING  TENDERNESS IN MOUTH AND THROAT WITH OR WITHOUT PRESENCE OF ULCERS  *URINARY PROBLEMS  *BOWEL PROBLEMS  UNUSUAL RASH Items with * indicate a potential emergency and should be followed up as soon as possible.  Feel free to call the clinic you have any questions or concerns. The clinic phone number is [phone].    

## 2012-11-26 ENCOUNTER — Other Ambulatory Visit: Payer: Self-pay | Admitting: Oncology

## 2012-11-27 ENCOUNTER — Other Ambulatory Visit (HOSPITAL_BASED_OUTPATIENT_CLINIC_OR_DEPARTMENT_OTHER): Payer: BC Managed Care – PPO | Admitting: Lab

## 2012-11-27 ENCOUNTER — Ambulatory Visit (HOSPITAL_BASED_OUTPATIENT_CLINIC_OR_DEPARTMENT_OTHER): Payer: BC Managed Care – PPO

## 2012-11-27 ENCOUNTER — Other Ambulatory Visit: Payer: Self-pay | Admitting: Oncology

## 2012-11-27 VITALS — BP 112/73 | HR 63 | Temp 98.0°F | Resp 18

## 2012-11-27 DIAGNOSIS — C9000 Multiple myeloma not having achieved remission: Secondary | ICD-10-CM

## 2012-11-27 DIAGNOSIS — C9002 Multiple myeloma in relapse: Secondary | ICD-10-CM

## 2012-11-27 DIAGNOSIS — Z5112 Encounter for antineoplastic immunotherapy: Secondary | ICD-10-CM

## 2012-11-27 LAB — COMPREHENSIVE METABOLIC PANEL (CC13)
ALT: 21 U/L (ref 0–55)
AST: 14 U/L (ref 5–34)
Albumin: 3.6 g/dL (ref 3.5–5.0)
Alkaline Phosphatase: 32 U/L — ABNORMAL LOW (ref 40–150)
BUN: 17.1 mg/dL (ref 7.0–26.0)
CO2: 25 mEq/L (ref 22–29)
Calcium: 8.9 mg/dL (ref 8.4–10.4)
Chloride: 105 mEq/L (ref 98–109)
Creatinine: 0.8 mg/dL (ref 0.7–1.3)
Glucose: 95 mg/dl (ref 70–140)
Potassium: 4.1 mEq/L (ref 3.5–5.1)
Sodium: 138 mEq/L (ref 136–145)
Total Bilirubin: 0.92 mg/dL (ref 0.20–1.20)
Total Protein: 8.1 g/dL (ref 6.4–8.3)

## 2012-11-27 LAB — CBC WITH DIFFERENTIAL/PLATELET
BASO%: 0.4 % (ref 0.0–2.0)
Basophils Absolute: 0 10*3/uL (ref 0.0–0.1)
EOS%: 2.7 % (ref 0.0–7.0)
Eosinophils Absolute: 0.1 10*3/uL (ref 0.0–0.5)
HCT: 39.6 % (ref 38.4–49.9)
HGB: 13.3 g/dL (ref 13.0–17.1)
LYMPH%: 36.1 % (ref 14.0–49.0)
MCH: 32 pg (ref 27.2–33.4)
MCHC: 33.6 g/dL (ref 32.0–36.0)
MCV: 95.2 fL (ref 79.3–98.0)
MONO#: 0.3 10*3/uL (ref 0.1–0.9)
MONO%: 9.5 % (ref 0.0–14.0)
NEUT#: 1.4 10*3/uL — ABNORMAL LOW (ref 1.5–6.5)
NEUT%: 51.3 % (ref 39.0–75.0)
Platelets: 181 10*3/uL (ref 140–400)
RBC: 4.16 10*6/uL — ABNORMAL LOW (ref 4.20–5.82)
RDW: 14.1 % (ref 11.0–14.6)
WBC: 2.6 10*3/uL — ABNORMAL LOW (ref 4.0–10.3)
lymph#: 1 10*3/uL (ref 0.9–3.3)
nRBC: 0 % (ref 0–0)

## 2012-11-27 LAB — LACTATE DEHYDROGENASE (CC13): LDH: 154 U/L (ref 125–245)

## 2012-11-27 MED ORDER — ONDANSETRON HCL 8 MG PO TABS
8.0000 mg | ORAL_TABLET | Freq: Once | ORAL | Status: AC
  Administered 2012-11-27: 8 mg via ORAL

## 2012-11-27 MED ORDER — LENALIDOMIDE 5 MG PO CAPS: 25.0000 mg | ORAL_CAPSULE | Freq: Every day | ORAL | Status: DC

## 2012-11-27 MED ORDER — BORTEZOMIB CHEMO SQ INJECTION 3.5 MG (2.5MG/ML)
1.5000 mg/m2 | Freq: Once | INTRAMUSCULAR | Status: AC
Start: 2012-11-27 — End: 2012-11-27
  Administered 2012-11-27: 2.75 mg via SUBCUTANEOUS
  Filled 2012-11-27: qty 2.75

## 2012-11-27 NOTE — Progress Notes (Signed)
0930 Ok to treat with ANC @ 1.4 per Dr Cyndie Chime.

## 2012-11-27 NOTE — Patient Instructions (Addendum)
[facility] Discharge Instructions for Patients Receiving Chemotherapy  Today you received the following chemotherapy agent Velcade injection.  To help prevent nausea and vomiting after your treatment, we encourage you to take your nausea medication.   If you develop nausea and vomiting that is not controlled by your nausea medication, call the clinic.   BELOW ARE SYMPTOMS THAT SHOULD BE REPORTED IMMEDIATELY:  *FEVER GREATER THAN 100.5 F  *CHILLS WITH OR WITHOUT FEVER  NAUSEA AND VOMITING THAT IS NOT CONTROLLED WITH YOUR NAUSEA MEDICATION  *UNUSUAL SHORTNESS OF BREATH  *UNUSUAL BRUISING OR BLEEDING  TENDERNESS IN MOUTH AND THROAT WITH OR WITHOUT PRESENCE OF ULCERS  *URINARY PROBLEMS  *BOWEL PROBLEMS  UNUSUAL RASH Items with * indicate a potential emergency and should be followed up as soon as possible.  Feel free to call the clinic you have any questions or concerns. The clinic phone number is [phone].  Bortezomib injection What is this medicine? BORTEZOMIB (bor TEZ oh mib) is a chemotherapy drug. It slows the growth of cancer cells. This medicine is used to treat multiple myeloma, lymphoma, and other cancers. This medicine may be used for other purposes; ask your health care provider or pharmacist if you have questions. What should I tell my health care provider before I take this medicine? They need to know if you have any of these conditions: -heart disease -irregular heartbeat -liver disease -low blood counts, like low white blood cells, platelets, or hemoglobin -peripheral neuropathy -taking medicine for blood pressure -an unusual or allergic reaction to bortezomib, mannitol, boron, other medicines, foods, dyes, or preservatives -pregnant or trying to get pregnant -breast-feeding How should I use this medicine? This medicine is for injection into a vein or for injection under the skin. It is given by a health care professional in a hospital or  clinic setting. Talk to your pediatrician regarding the use of this medicine in children. Special care may be needed. Overdosage: If you think you have taken too much of this medicine contact a poison control center or emergency room at once. NOTE: This medicine is only for you. Do not share this medicine with others. What if I miss a dose? It is important not to miss your dose. Call your doctor or health care professional if you are unable to keep an appointment. What may interact with this medicine? -medicines for diabetes -medicines to increase blood counts like filgrastim, pegfilgrastim, sargramostim -zalcitabine Talk to your doctor or health care professional before taking any of these medicines: -acetaminophen -aspirin -ibuprofen -ketoprofen -naproxen This list may not describe all possible interactions. Give your health care provider a list of all the medicines, herbs, non-prescription drugs, or dietary supplements you use. Also tell them if you smoke, drink alcohol, or use illegal drugs. Some items may interact with your medicine. What should I watch for while using this medicine? Visit your doctor for checks on your progress. This drug may make you feel generally unwell. This is not uncommon, as chemotherapy can affect healthy cells as well as cancer cells. Report any side effects. Continue your course of treatment even though you feel ill unless your doctor tells you to stop. You may get drowsy or dizzy. Do not drive, use machinery, or do anything that needs mental alertness until you know how this medicine affects you. Do not stand or sit up quickly, especially if you are an older patient. This reduces the risk of dizzy or fainting spells. In some cases, you may be given   additional medicines to help with side effects. Follow all directions for their use. Call your doctor or health care professional for advice if you get a fever, chills or sore throat, or other symptoms of a cold or  flu. Do not treat yourself. This drug decreases your body's ability to fight infections. Try to avoid being around people who are sick. This medicine may increase your risk to bruise or bleed. Call your doctor or health care professional if you notice any unusual bleeding. Be careful brushing and flossing your teeth or using a toothpick because you may get an infection or bleed more easily. If you have any dental work done, tell your dentist you are receiving this medicine. Avoid taking products that contain aspirin, acetaminophen, ibuprofen, naproxen, or ketoprofen unless instructed by your doctor. These medicines may hide a fever. Do not become pregnant while taking this medicine. Women should inform their doctor if they wish to become pregnant or think they might be pregnant. There is a potential for serious side effects to an unborn child. Talk to your health care professional or pharmacist for more information. Do not breast-feed an infant while taking this medicine. You may have vomiting or diarrhea while taking this medicine. Drink water or other fluids as directed. What side effects may I notice from receiving this medicine? Side effects that you should report to your doctor or health care professional as soon as possible: -allergic reactions like skin rash, itching or hives, swelling of the face, lips, or tongue -breathing problems -changes in hearing -changes in vision -fast, irregular heartbeat -feeling faint or lightheaded, falls -pain, tingling, numbness in the hands or feet -seizures -swelling of the ankles, feet, hands -unusual bleeding or bruising -unusually weak or tired -vomiting Side effects that usually do not require medical attention (report to your doctor or health care professional if they continue or are bothersome): -changes in emotions or moods -constipation -diarrhea -loss of appetite -headache -irritation at site where injected -nausea This list may not  describe all possible side effects. Call your doctor for medical advice about side effects. You may report side effects to FDA at 1-800-FDA-1088. Where should I keep my medicine? This drug is given in a hospital or clinic and will not be stored at home. NOTE: This sheet is a summary. It may not cover all possible information. If you have questions about this medicine, talk to your doctor, pharmacist, or health care provider.  2013, [employer]. (06/21/2010 11:42:36 AM)   

## 2012-12-01 LAB — KAPPA/LAMBDA LIGHT CHAINS
Kappa free light chain: 19 mg/dL — ABNORMAL HIGH (ref 0.33–1.94)
Kappa:Lambda Ratio: 33.93 — ABNORMAL HIGH (ref 0.26–1.65)
Lambda Free Lght Chn: 0.56 mg/dL — ABNORMAL LOW (ref 0.57–2.63)

## 2012-12-01 LAB — IGG: IgG (Immunoglobin G), Serum: 1970 mg/dL — ABNORMAL HIGH (ref 650–1600)

## 2012-12-05 ENCOUNTER — Other Ambulatory Visit: Payer: Self-pay | Admitting: Oncology

## 2012-12-05 ENCOUNTER — Telehealth: Payer: Self-pay | Admitting: *Deleted

## 2012-12-05 ENCOUNTER — Ambulatory Visit (HOSPITAL_BASED_OUTPATIENT_CLINIC_OR_DEPARTMENT_OTHER): Payer: BC Managed Care – PPO | Admitting: Oncology

## 2012-12-05 ENCOUNTER — Ambulatory Visit (HOSPITAL_BASED_OUTPATIENT_CLINIC_OR_DEPARTMENT_OTHER): Payer: BC Managed Care – PPO

## 2012-12-05 ENCOUNTER — Other Ambulatory Visit (HOSPITAL_BASED_OUTPATIENT_CLINIC_OR_DEPARTMENT_OTHER): Payer: BC Managed Care – PPO

## 2012-12-05 VITALS — BP 111/72 | HR 60 | Temp 98.0°F | Resp 18 | Ht 65.0 in | Wt 168.9 lb

## 2012-12-05 DIAGNOSIS — C9000 Multiple myeloma not having achieved remission: Secondary | ICD-10-CM

## 2012-12-05 DIAGNOSIS — Z5112 Encounter for antineoplastic immunotherapy: Secondary | ICD-10-CM

## 2012-12-05 LAB — CBC WITH DIFFERENTIAL/PLATELET
BASO%: 0.4 % (ref 0.0–2.0)
Basophils Absolute: 0 10*3/uL (ref 0.0–0.1)
EOS%: 1.2 % (ref 0.0–7.0)
Eosinophils Absolute: 0 10*3/uL (ref 0.0–0.5)
HCT: 37.5 % — ABNORMAL LOW (ref 38.4–49.9)
HGB: 13.1 g/dL (ref 13.0–17.1)
LYMPH%: 22.2 % (ref 14.0–49.0)
MCH: 33.8 pg — ABNORMAL HIGH (ref 27.2–33.4)
MCHC: 35 g/dL (ref 32.0–36.0)
MCV: 96.6 fL (ref 79.3–98.0)
MONO#: 0.4 10*3/uL (ref 0.1–0.9)
MONO%: 10.7 % (ref 0.0–14.0)
NEUT#: 2.5 10*3/uL (ref 1.5–6.5)
NEUT%: 65.5 % (ref 39.0–75.0)
Platelets: 160 10*3/uL (ref 140–400)
RBC: 3.88 10*6/uL — ABNORMAL LOW (ref 4.20–5.82)
RDW: 14.1 % (ref 11.0–14.6)
WBC: 3.8 10*3/uL — ABNORMAL LOW (ref 4.0–10.3)
lymph#: 0.9 10*3/uL (ref 0.9–3.3)

## 2012-12-05 MED ORDER — BORTEZOMIB CHEMO SQ INJECTION 3.5 MG (2.5MG/ML)
1.5000 mg/m2 | Freq: Once | INTRAMUSCULAR | Status: AC
  Administered 2012-12-05: 2.75 mg via SUBCUTANEOUS
  Filled 2012-12-05: qty 2.75

## 2012-12-05 MED ORDER — ONDANSETRON HCL 8 MG PO TABS
8.0000 mg | ORAL_TABLET | Freq: Once | ORAL | Status: AC
  Administered 2012-12-05: 8 mg via ORAL

## 2012-12-05 NOTE — Telephone Encounter (Signed)
appts made and printed...td 

## 2012-12-05 NOTE — Telephone Encounter (Signed)
Per staff phone call and POF I have schedueld appts.  JMW  

## 2012-12-05 NOTE — Patient Instructions (Addendum)
[facility] Discharge Instructions for Patients Receiving Chemotherapy  Today you received the following chemotherapy agents: Velcade.  To help prevent nausea and vomiting after your treatment, we encourage you to take your nausea medication as prescribed.   If you develop nausea and vomiting that is not controlled by your nausea medication, call the clinic.   BELOW ARE SYMPTOMS THAT SHOULD BE REPORTED IMMEDIATELY:  *FEVER GREATER THAN 100.5 F  *CHILLS WITH OR WITHOUT FEVER  NAUSEA AND VOMITING THAT IS NOT CONTROLLED WITH YOUR NAUSEA MEDICATION  *UNUSUAL SHORTNESS OF BREATH  *UNUSUAL BRUISING OR BLEEDING  TENDERNESS IN MOUTH AND THROAT WITH OR WITHOUT PRESENCE OF ULCERS  *URINARY PROBLEMS  *BOWEL PROBLEMS  UNUSUAL RASH Items with * indicate a potential emergency and should be followed up as soon as possible.  Feel free to call the clinic you have any questions or concerns. The clinic phone number is [phone].    

## 2012-12-05 NOTE — Progress Notes (Signed)
Hematology and Oncology Follow Up Visit  [name] [id number] 05-05-1961 52 y.o. 12/05/2012 3:19 PM   Principle Diagnosis: Encounter Diagnosis  Name Primary?  . Multiple myeloma, without mention of having achieved remission(203.00) Yes     Interim History:   Short interim followup visit for this 52 year old man recently diagnosed with stage I multiple myeloma, IPI good risk, single, questionable, bone lesion in the left mid humeral shaft, initial IgG 4 g, kappa restriction, kappa free light chains 27.9 mg percent, beta 2 microglobulin 1.84, calcium 9.2, albumin 3.5, hemoglobin 13, 24-hour urine 190 mg of protein with questionable faint monoclonal band on IFE. Bone marrow biopsy with 25% plasma cells. He declined a clinical trial but wanted aggressive therapy. He was started on RVD with Revlimid 25 mg day 1 through 25 #28 day cycle, Velcade 1.5 mg per meter squared subcutaneous weekly, and Decadron 40 mg by mouth weekly.  He is tolerating treatment well. Some intermittent nausea. No vomiting. No paresthesias. We are already seeing a significant response with approximate 50% reduction in his total IgG down to current value of 1970 mg percent with concomitant fall in kappa free light chains from 28 down to 19 mg percent. White blood count has actually come up. Hemoglobin as well and platelet count is stable. (Lab done 11/27/2012).  Medications: reviewed  Allergies:  Allergies  Allergen Reactions  . Toprol Xl (Metoprolol Tartrate)     intolerance    Review of Systems: Constitutional:   No constitutional symptoms Respiratory: No cough or dyspnea Cardiovascular:  No chest pain or palpitations Gastrointestinal: No abdominal pain Genito-Urinary: Nocturia Musculoskeletal: No muscle bone or joint pain Neurologic: No headache or change in vision. No paresthesias Skin: No rash or ecchymosis Remaining ROS negative.  Physical Exam: Blood pressure 111/72, pulse 60, temperature 98 F (36.7  C), temperature source Oral, resp. rate 18, height 5\' 5"  (1.651 m), weight 168 lb 14.4 oz (76.613 kg). Wt Readings from Last 3 Encounters:  12/05/12 168 lb 14.4 oz (76.613 kg)  11/03/12 170 lb 1.6 oz (77.157 kg)  10/13/12 169 lb 6.4 oz (76.839 kg)     General appearance: Well-nourished Caucasian man HENNT: Pharynx no erythema or exudate Lymph nodes: No adenopathy Breasts: Lungs: Clear to auscultation resonant to percussion Heart: Regular rhythm. Loud 3/6 systolic murmur heard all over the precordium loudest at the apex and left sternal border Abdomen: Soft, nontender, no mass, no organomegaly Extremities: No edema, no calf tenderness Musculoskeletal: No joint deformities GU: Vascular: No cyanosis Neurologic: Motor strength 5 over 5, reflexes 1+ symmetric, sensation intact to vibration over the fingertips Skin: No rash or ecchymosis  Lab Results: Lab Results  Component Value Date   WBC 3.8* 12/05/2012   HGB 13.1 12/05/2012   HCT 37.5* 12/05/2012   MCV 96.6 12/05/2012   PLT 160 12/05/2012     Chemistry      Component Value Date/Time   NA 138 11/27/2012 0856   NA 140 10/11/2010 0949   K 4.1 11/27/2012 0856   K 4.9 10/11/2010 0949   CL 103 10/31/2012 1116   CL 102 10/11/2010 0949   CO2 25 11/27/2012 0856   CO2 26 10/11/2010 0949   BUN 17.1 11/27/2012 0856   BUN 18 10/11/2010 0949   CREATININE 0.8 11/27/2012 0856   CREATININE 0.89 10/11/2010 0949      Component Value Date/Time   CALCIUM 8.9 11/27/2012 0856   CALCIUM 9.9 10/11/2010 0949   ALKPHOS 32* 11/27/2012 0856   ALKPHOS 27*  10/11/2010 0949   AST 14 11/27/2012 0856   AST 18 10/11/2010 0949   ALT 21 11/27/2012 0856   ALT 21 10/11/2010 0949   BILITOT 0.92 11/27/2012 0856   BILITOT 0.9 10/11/2010 0949       Radiological Studies: No results found.  Impression: #1. Stage I multiple myeloma Responding to treatment with RVD Plan: I'm going to arrange a consultation with respect to consolidation chemotherapy with IV melphalan with stem cell  support at [facility].  #2. Coronary artery disease status post bypass surgery June 2005 Currently asymptomatic.  #3. Mitral and aortic valve regurgitation   CC:. Dr. Sharlot Gowda; Dr. Bryan Lemma   Levert Feinstein, MD 7/11/20143:19 PM

## 2012-12-08 ENCOUNTER — Telehealth: Payer: Self-pay | Admitting: Oncology

## 2012-12-08 NOTE — Telephone Encounter (Signed)
KIM in HIM made aware of request for appt @ [facility] per 7/11 pof. Other appts per pof already scheduled.

## 2012-12-09 ENCOUNTER — Telehealth: Payer: Self-pay | Admitting: Oncology

## 2012-12-09 NOTE — Telephone Encounter (Signed)
Pt appt. With Dr. Barbaraann Boys is 01/08/13@10 :00. Medical records faxed,slides and scans will be fedex'ed. Left pt vm to return call.

## 2012-12-11 ENCOUNTER — Other Ambulatory Visit (HOSPITAL_BASED_OUTPATIENT_CLINIC_OR_DEPARTMENT_OTHER): Payer: BC Managed Care – PPO

## 2012-12-11 ENCOUNTER — Ambulatory Visit (HOSPITAL_BASED_OUTPATIENT_CLINIC_OR_DEPARTMENT_OTHER): Payer: BC Managed Care – PPO

## 2012-12-11 VITALS — BP 117/65 | HR 60 | Temp 98.5°F

## 2012-12-11 DIAGNOSIS — C9000 Multiple myeloma not having achieved remission: Secondary | ICD-10-CM

## 2012-12-11 DIAGNOSIS — Z5112 Encounter for antineoplastic immunotherapy: Secondary | ICD-10-CM

## 2012-12-11 LAB — CBC WITH DIFFERENTIAL/PLATELET
BASO%: 0.5 % (ref 0.0–2.0)
Basophils Absolute: 0 10*3/uL (ref 0.0–0.1)
EOS%: 2.1 % (ref 0.0–7.0)
Eosinophils Absolute: 0.1 10*3/uL (ref 0.0–0.5)
HCT: 36.6 % — ABNORMAL LOW (ref 38.4–49.9)
HGB: 12.9 g/dL — ABNORMAL LOW (ref 13.0–17.1)
LYMPH%: 26.7 % (ref 14.0–49.0)
MCH: 33.4 pg (ref 27.2–33.4)
MCHC: 35.1 g/dL (ref 32.0–36.0)
MCV: 95 fL (ref 79.3–98.0)
MONO#: 0.3 10*3/uL (ref 0.1–0.9)
MONO%: 10.8 % (ref 0.0–14.0)
NEUT#: 1.6 10*3/uL (ref 1.5–6.5)
NEUT%: 59.9 % (ref 39.0–75.0)
Platelets: 141 10*3/uL (ref 140–400)
RBC: 3.85 10*6/uL — ABNORMAL LOW (ref 4.20–5.82)
RDW: 14.2 % (ref 11.0–14.6)
WBC: 2.7 10*3/uL — ABNORMAL LOW (ref 4.0–10.3)
lymph#: 0.7 10*3/uL — ABNORMAL LOW (ref 0.9–3.3)

## 2012-12-11 LAB — COMPREHENSIVE METABOLIC PANEL (CC13)
ALT: 24 U/L (ref 0–55)
AST: 14 U/L (ref 5–34)
Albumin: 3.4 g/dL — ABNORMAL LOW (ref 3.5–5.0)
Alkaline Phosphatase: 29 U/L — ABNORMAL LOW (ref 40–150)
BUN: 12.8 mg/dL (ref 7.0–26.0)
CO2: 22 mEq/L (ref 22–29)
Calcium: 8.4 mg/dL (ref 8.4–10.4)
Chloride: 107 mEq/L (ref 98–109)
Creatinine: 1 mg/dL (ref 0.7–1.3)
Glucose: 100 mg/dl (ref 70–140)
Potassium: 4 mEq/L (ref 3.5–5.1)
Sodium: 140 mEq/L (ref 136–145)
Total Bilirubin: 1.06 mg/dL (ref 0.20–1.20)
Total Protein: 7.1 g/dL (ref 6.4–8.3)

## 2012-12-11 LAB — IGG: IgG (Immunoglobin G), Serum: 1710 mg/dL — ABNORMAL HIGH (ref 650–1600)

## 2012-12-11 MED ORDER — ONDANSETRON HCL 8 MG PO TABS
8.0000 mg | ORAL_TABLET | Freq: Once | ORAL | Status: AC
  Administered 2012-12-11: 8 mg via ORAL

## 2012-12-11 MED ORDER — BORTEZOMIB CHEMO SQ INJECTION 3.5 MG (2.5MG/ML)
1.5000 mg/m2 | Freq: Once | INTRAMUSCULAR | Status: AC
  Administered 2012-12-11: 2.75 mg via SUBCUTANEOUS
  Filled 2012-12-11: qty 2.75

## 2012-12-11 NOTE — Patient Instructions (Signed)
[facility] Discharge Instructions for Patients Receiving Chemotherapy  Today you received the following chemotherapy agents: Velcade.  To help prevent nausea and vomiting after your treatment, we encourage you to take your nausea medication as prescribed.   If you develop nausea and vomiting that is not controlled by your nausea medication, call the clinic.   BELOW ARE SYMPTOMS THAT SHOULD BE REPORTED IMMEDIATELY:  *FEVER GREATER THAN 100.5 F  *CHILLS WITH OR WITHOUT FEVER  NAUSEA AND VOMITING THAT IS NOT CONTROLLED WITH YOUR NAUSEA MEDICATION  *UNUSUAL SHORTNESS OF BREATH  *UNUSUAL BRUISING OR BLEEDING  TENDERNESS IN MOUTH AND THROAT WITH OR WITHOUT PRESENCE OF ULCERS  *URINARY PROBLEMS  *BOWEL PROBLEMS  UNUSUAL RASH Items with * indicate a potential emergency and should be followed up as soon as possible.  Feel free to call the clinic you have any questions or concerns. The clinic phone number is [phone].    

## 2012-12-12 ENCOUNTER — Other Ambulatory Visit: Payer: Self-pay | Admitting: *Deleted

## 2012-12-12 ENCOUNTER — Other Ambulatory Visit: Payer: Self-pay | Admitting: Lab

## 2012-12-12 ENCOUNTER — Ambulatory Visit: Payer: Self-pay

## 2012-12-12 DIAGNOSIS — C9000 Multiple myeloma not having achieved remission: Secondary | ICD-10-CM

## 2012-12-12 MED ORDER — LENALIDOMIDE 25 MG PO CAPS: 25.0000 mg | ORAL_CAPSULE | Freq: Every day | ORAL | Status: DC

## 2012-12-16 NOTE — Telephone Encounter (Signed)
RECEIVED A FAX FROM [employer] CONCERNING A CONFIRMATION OF PRESCRIPTION SHIPMENT FOR REVLIMID ON 12/15/12.

## 2012-12-18 ENCOUNTER — Other Ambulatory Visit: Payer: Self-pay | Admitting: Oncology

## 2012-12-18 ENCOUNTER — Ambulatory Visit (HOSPITAL_BASED_OUTPATIENT_CLINIC_OR_DEPARTMENT_OTHER): Payer: BC Managed Care – PPO

## 2012-12-18 ENCOUNTER — Other Ambulatory Visit (HOSPITAL_BASED_OUTPATIENT_CLINIC_OR_DEPARTMENT_OTHER): Payer: BC Managed Care – PPO | Admitting: Lab

## 2012-12-18 VITALS — BP 113/70 | HR 56 | Temp 97.6°F | Resp 19 | Ht 65.0 in

## 2012-12-18 DIAGNOSIS — Z5112 Encounter for antineoplastic immunotherapy: Secondary | ICD-10-CM

## 2012-12-18 DIAGNOSIS — C9000 Multiple myeloma not having achieved remission: Secondary | ICD-10-CM

## 2012-12-18 LAB — COMPREHENSIVE METABOLIC PANEL (CC13)
ALT: 21 U/L (ref 0–55)
AST: 14 U/L (ref 5–34)
Albumin: 3.4 g/dL — ABNORMAL LOW (ref 3.5–5.0)
Alkaline Phosphatase: 30 U/L — ABNORMAL LOW (ref 40–150)
BUN: 15.9 mg/dL (ref 7.0–26.0)
CO2: 24 mEq/L (ref 22–29)
Calcium: 8.5 mg/dL (ref 8.4–10.4)
Chloride: 110 mEq/L — ABNORMAL HIGH (ref 98–109)
Creatinine: 0.8 mg/dL (ref 0.7–1.3)
Glucose: 90 mg/dl (ref 70–140)
Potassium: 4.4 mEq/L (ref 3.5–5.1)
Sodium: 143 mEq/L (ref 136–145)
Total Bilirubin: 0.79 mg/dL (ref 0.20–1.20)
Total Protein: 7.2 g/dL (ref 6.4–8.3)

## 2012-12-18 LAB — CBC WITH DIFFERENTIAL/PLATELET
BASO%: 0.3 % (ref 0.0–2.0)
Basophils Absolute: 0 10*3/uL (ref 0.0–0.1)
EOS%: 0.5 % (ref 0.0–7.0)
Eosinophils Absolute: 0 10*3/uL (ref 0.0–0.5)
HCT: 37.6 % — ABNORMAL LOW (ref 38.4–49.9)
HGB: 13 g/dL (ref 13.0–17.1)
LYMPH%: 37.5 % (ref 14.0–49.0)
MCH: 33.4 pg (ref 27.2–33.4)
MCHC: 34.6 g/dL (ref 32.0–36.0)
MCV: 96.6 fL (ref 79.3–98.0)
MONO#: 0.4 10*3/uL (ref 0.1–0.9)
MONO%: 15.9 % — ABNORMAL HIGH (ref 0.0–14.0)
NEUT#: 1.2 10*3/uL — ABNORMAL LOW (ref 1.5–6.5)
NEUT%: 45.8 % (ref 39.0–75.0)
Platelets: 149 10*3/uL (ref 140–400)
RBC: 3.89 10*6/uL — ABNORMAL LOW (ref 4.20–5.82)
RDW: 14.6 % (ref 11.0–14.6)
WBC: 2.7 10*3/uL — ABNORMAL LOW (ref 4.0–10.3)
lymph#: 1 10*3/uL (ref 0.9–3.3)

## 2012-12-18 LAB — IGG: IgG (Immunoglobin G), Serum: 1690 mg/dL — ABNORMAL HIGH (ref 650–1600)

## 2012-12-18 MED ORDER — BORTEZOMIB CHEMO SQ INJECTION 3.5 MG (2.5MG/ML)
1.5000 mg/m2 | Freq: Once | INTRAMUSCULAR | Status: AC
  Administered 2012-12-18: 2.75 mg via SUBCUTANEOUS
  Filled 2012-12-18: qty 2.75

## 2012-12-18 MED ORDER — ONDANSETRON HCL 8 MG PO TABS
8.0000 mg | ORAL_TABLET | Freq: Once | ORAL | Status: AC
  Administered 2012-12-18: 8 mg via ORAL

## 2012-12-18 NOTE — Patient Instructions (Addendum)
[facility] Discharge Instructions for Patients Receiving Chemotherapy  Today you received the following chemotherapy agents: Velcade. To help prevent nausea and vomiting after your treatment, we encourage you to take your nausea medication.  If you develop nausea and vomiting that is not controlled by your nausea medication, call the clinic.   BELOW ARE SYMPTOMS THAT SHOULD BE REPORTED IMMEDIATELY:  *FEVER GREATER THAN 100.5 F  *CHILLS WITH OR WITHOUT FEVER  NAUSEA AND VOMITING THAT IS NOT CONTROLLED WITH YOUR NAUSEA MEDICATION  *UNUSUAL SHORTNESS OF BREATH  *UNUSUAL BRUISING OR BLEEDING  TENDERNESS IN MOUTH AND THROAT WITH OR WITHOUT PRESENCE OF ULCERS  *URINARY PROBLEMS  *BOWEL PROBLEMS  UNUSUAL RASH Items with * indicate a potential emergency and should be followed up as soon as possible.  Feel free to call the clinic you have any questions or concerns. The clinic phone number is [phone].    

## 2012-12-22 ENCOUNTER — Telehealth: Payer: Self-pay | Admitting: Family Medicine

## 2012-12-22 MED ORDER — HYDROCOD POLST-CHLORPHEN POLST 10-8 MG/5ML PO LQCR: 5.0000 mL | Freq: Two times a day (BID) | ORAL | Status: DC | PRN

## 2012-12-22 NOTE — Telephone Encounter (Signed)
CALLED TUSSIONEX

## 2012-12-22 NOTE — Telephone Encounter (Signed)
Call in Tussionex 

## 2012-12-23 ENCOUNTER — Emergency Department (HOSPITAL_COMMUNITY)
Admission: EM | Admit: 2012-12-23 | Discharge: 2012-12-24 | Disposition: A | Discharge status: Home / Self Care | Payer: BC Managed Care – PPO | Attending: Emergency Medicine | Admitting: Emergency Medicine

## 2012-12-23 ENCOUNTER — Encounter (HOSPITAL_COMMUNITY): Payer: Self-pay | Admitting: Emergency Medicine

## 2012-12-23 ENCOUNTER — Emergency Department (HOSPITAL_COMMUNITY): Payer: BC Managed Care – PPO

## 2012-12-23 DIAGNOSIS — D72819 Decreased white blood cell count, unspecified: Secondary | ICD-10-CM | POA: Insufficient documentation

## 2012-12-23 DIAGNOSIS — Z888 Allergy status to other drugs, medicaments and biological substances status: Secondary | ICD-10-CM | POA: Insufficient documentation

## 2012-12-23 DIAGNOSIS — R61 Generalized hyperhidrosis: Secondary | ICD-10-CM | POA: Insufficient documentation

## 2012-12-23 DIAGNOSIS — Z79899 Other long term (current) drug therapy: Secondary | ICD-10-CM | POA: Insufficient documentation

## 2012-12-23 DIAGNOSIS — N529 Male erectile dysfunction, unspecified: Secondary | ICD-10-CM | POA: Insufficient documentation

## 2012-12-23 DIAGNOSIS — R509 Fever, unspecified: Secondary | ICD-10-CM | POA: Insufficient documentation

## 2012-12-23 DIAGNOSIS — I059 Rheumatic mitral valve disease, unspecified: Secondary | ICD-10-CM | POA: Insufficient documentation

## 2012-12-23 DIAGNOSIS — J309 Allergic rhinitis, unspecified: Secondary | ICD-10-CM | POA: Insufficient documentation

## 2012-12-23 DIAGNOSIS — IMO0001 Reserved for inherently not codable concepts without codable children: Secondary | ICD-10-CM | POA: Insufficient documentation

## 2012-12-23 DIAGNOSIS — D649 Anemia, unspecified: Secondary | ICD-10-CM | POA: Insufficient documentation

## 2012-12-23 DIAGNOSIS — Z7982 Long term (current) use of aspirin: Secondary | ICD-10-CM | POA: Insufficient documentation

## 2012-12-23 DIAGNOSIS — Z7902 Long term (current) use of antithrombotics/antiplatelets: Secondary | ICD-10-CM | POA: Insufficient documentation

## 2012-12-23 DIAGNOSIS — I2510 Atherosclerotic heart disease of native coronary artery without angina pectoris: Secondary | ICD-10-CM | POA: Insufficient documentation

## 2012-12-23 DIAGNOSIS — C9000 Multiple myeloma not having achieved remission: Secondary | ICD-10-CM | POA: Insufficient documentation

## 2012-12-23 DIAGNOSIS — E785 Hyperlipidemia, unspecified: Secondary | ICD-10-CM | POA: Insufficient documentation

## 2012-12-23 DIAGNOSIS — D472 Monoclonal gammopathy: Secondary | ICD-10-CM | POA: Insufficient documentation

## 2012-12-23 DIAGNOSIS — J40 Bronchitis, not specified as acute or chronic: Secondary | ICD-10-CM

## 2012-12-23 DIAGNOSIS — Z9189 Other specified personal risk factors, not elsewhere classified: Secondary | ICD-10-CM | POA: Insufficient documentation

## 2012-12-23 DIAGNOSIS — J209 Acute bronchitis, unspecified: Secondary | ICD-10-CM | POA: Insufficient documentation

## 2012-12-23 DIAGNOSIS — R6883 Chills (without fever): Secondary | ICD-10-CM | POA: Insufficient documentation

## 2012-12-23 DIAGNOSIS — I359 Nonrheumatic aortic valve disorder, unspecified: Secondary | ICD-10-CM | POA: Insufficient documentation

## 2012-12-23 LAB — CBC WITH DIFFERENTIAL/PLATELET
Basophils Absolute: 0 10*3/uL (ref 0.0–0.1)
Basophils Relative: 0 % (ref 0–1)
Eosinophils Absolute: 0 10*3/uL (ref 0.0–0.7)
Eosinophils Relative: 0 % (ref 0–5)
HCT: 37 % — ABNORMAL LOW (ref 39.0–52.0)
Hemoglobin: 13.2 g/dL (ref 13.0–17.0)
Lymphocytes Relative: 14 % (ref 12–46)
Lymphs Abs: 0.4 10*3/uL — ABNORMAL LOW (ref 0.7–4.0)
MCH: 33.3 pg (ref 26.0–34.0)
MCHC: 35.7 g/dL (ref 30.0–36.0)
MCV: 93.4 fL (ref 78.0–100.0)
Monocytes Absolute: 0.3 10*3/uL (ref 0.1–1.0)
Monocytes Relative: 9 % (ref 3–12)
Neutro Abs: 2.3 10*3/uL (ref 1.7–7.7)
Neutrophils Relative %: 77 % (ref 43–77)
Platelets: 149 10*3/uL — ABNORMAL LOW (ref 150–400)
RBC: 3.96 MIL/uL — ABNORMAL LOW (ref 4.22–5.81)
RDW: 13.5 % (ref 11.5–15.5)
WBC: 2.9 10*3/uL — ABNORMAL LOW (ref 4.0–10.5)

## 2012-12-23 LAB — URINALYSIS, ROUTINE W REFLEX MICROSCOPIC
Bilirubin Urine: NEGATIVE
Glucose, UA: NEGATIVE mg/dL
Hgb urine dipstick: NEGATIVE
Ketones, ur: NEGATIVE mg/dL
Leukocytes, UA: NEGATIVE
Nitrite: NEGATIVE
Protein, ur: NEGATIVE mg/dL
Specific Gravity, Urine: 1.009 (ref 1.005–1.030)
Urobilinogen, UA: 1 mg/dL (ref 0.0–1.0)
pH: 7 (ref 5.0–8.0)

## 2012-12-23 LAB — COMPREHENSIVE METABOLIC PANEL
ALT: 16 U/L (ref 0–53)
AST: 14 U/L (ref 0–37)
Albumin: 3.7 g/dL (ref 3.5–5.2)
Alkaline Phosphatase: 31 U/L — ABNORMAL LOW (ref 39–117)
BUN: 8 mg/dL (ref 6–23)
CO2: 26 mEq/L (ref 19–32)
Calcium: 8.7 mg/dL (ref 8.4–10.5)
Chloride: 98 mEq/L (ref 96–112)
Creatinine, Ser: 0.85 mg/dL (ref 0.50–1.35)
GFR calc Af Amer: >90 mL/min (ref >90)
GFR calc non Af Amer: >90 mL/min (ref >90)
Glucose, Bld: 103 mg/dL — ABNORMAL HIGH (ref 70–99)
Potassium: 3.9 mEq/L (ref 3.5–5.1)
Sodium: 134 mEq/L — ABNORMAL LOW (ref 135–145)
Total Bilirubin: 1.1 mg/dL (ref 0.3–1.2)
Total Protein: 7.6 g/dL (ref 6.0–8.3)

## 2012-12-23 MED ORDER — AMOXICILLIN 500 MG PO CAPS: 1000.0000 mg | ORAL_CAPSULE | Freq: Two times a day (BID) | ORAL | Status: DC

## 2012-12-23 MED ORDER — AMOXICILLIN 500 MG PO CAPS
1000.0000 mg | ORAL_CAPSULE | Freq: Once | ORAL | Status: AC
  Administered 2012-12-23: 1000 mg via ORAL
  Filled 2012-12-23: qty 2

## 2012-12-23 MED ORDER — ALBUTEROL SULFATE HFA 108 (90 BASE) MCG/ACT IN AERS: 2.0000 | INHALATION_SPRAY | RESPIRATORY_TRACT | Status: DC | PRN

## 2012-12-23 MED ORDER — BENZONATATE 100 MG PO CAPS: 100.0000 mg | ORAL_CAPSULE | Freq: Three times a day (TID) | ORAL | Status: DC

## 2012-12-23 NOTE — ED Notes (Signed)
Pt currently being tx for myeloma. Last received chemo on Thursday. Reports productive cough x 2 days with yellow sputum. Pt noticed fever/chills today. Noted fever to be 102 at home, took ibuprofen. Generalized body aches and weakness.

## 2012-12-23 NOTE — ED Provider Notes (Signed)
CSN: [id number]     Arrival date & time 12/23/12  2203 History     First MD Initiated Contact with Patient 12/23/12 2257     Chief Complaint  Patient presents with  . Fever  . Cough   (Consider location/radiation/quality/duration/timing/severity/associated sxs/prior Treatment) Patient is a 52 y.o. male presenting with fever and cough. The history is provided by the patient.  Fever Associated symptoms: cough   Cough Associated symptoms: fever   He has had a cough for the last 2 days. Cough is productive of thick yellow sputum. Today, he developed fevers up to 102. This is associated with chills and sweats and body aches. Pain is moderate and had and he rates it as 7/10. There has been rhinorrhea but no sore throat. There's been no nausea vomiting or diarrhea. He denies any sick contacts. He does have a history of multiple myeloma. Of note, he has been taking Tussionex for the cough and it has been ineffective. A friend has been using Lawyer and he is wondering if that might be more helpful for a period  Past Medical History  Diagnosis Date  . Coronary artery disease     s/p CABG; followed by Dr. Bryan Lemma, [facility]  . Mitral valve prolapse   . Allergy   . Hyperlipidemia   . Echocardiogram abnormal 07/25/12    LV mild dilation, mild septal hypertrophy, moderate aortic regurg and mitral regurg, severe L atrium dilation, no regional wall abnormality, LV diastolic function normal; Dr. Bryan Lemma  . Aortic insufficiency   . Erectile dysfunction   . H/O cardiovascular stress test 02/23/10    normal perfusion, no ischemia or infarct; treadmill and Myoview perfusion study; Dr. Lynnea Ferrier  . Pneumococcal vaccine refused 08/15/2012  . Monoclonal gammopathy 09/01/2012    Increased total protein, increased beta globulin peak with decreased gammaglobulin peak on SPEP 08/18/12  IFE not done "possible faint abnormal band"  . Bence Jones proteinuria 09/01/2012    Borderline increase protein 190 mg on 24 hr urine IFE with free kappa light chains (lab normal 50-100 mg)  08/18/12  . Normochromic normocytic anemia 09/01/2012    Hb 12.7, MCV 95  08/18/12 was 13.7 1 year ago; concomitant leukopenia  & increased lymphs  . Leukopenia 09/01/2012    WBC 3,700 42 poly, 48 lymphs, 9 monos 08/18/12  Was 3,700 1 year ago  . Multiple myeloma, without mention of having achieved remission(203.00) 10/14/2012    Stage I Durie-Salmon; good prognosis international scoring system: see 10/13/12 progress note  . Multiple myeloma    Past Surgical History  Procedure Laterality Date  . Coronary artery bypass graft  10/2003    LIMA to LAD, RIMA to OM1, sequential to OM2; Dr. Lynnea Ferrier  . Colonoscopy      ?   No family history on file. History  Substance Use Topics  . Smoking status: Never Smoker   . Smokeless tobacco: Never Used  . Alcohol Use: No    Review of Systems  Constitutional: Positive for fever.  Respiratory: Positive for cough.   All other systems reviewed and are negative.    Allergies  Toprol xl  Home Medications   Current Outpatient Rx  Name  Route  Sig  Dispense  Refill  . acyclovir (ZOVIRAX) 400 MG tablet   Oral   Take 1 tablet (400 mg total) by mouth 2 (two) times daily.   60 tablet   3   . aspirin 81 MG tablet  Oral   Take 81 mg by mouth daily.           . Azelastine HCl (ASTEPRO) 0.15 % SOLN   Nasal   Place 1-2 sprays into the nose 2 (two) times daily.   30 mL   2   . bortezomib SQ (VELCADE) 3.5 MG SOLR   Subcutaneous   Inject into the skin once a week.         . chlorpheniramine-HYDROcodone (TUSSIONEX PENNKINETIC ER) 10-8 MG/5ML LQCR   Oral   Take 5 mLs by mouth every 12 (twelve) hours as needed.   140 mL   0   . clopidogrel (PLAVIX) 75 MG tablet   Oral   Take 75 mg by mouth daily.           Marland Kitchen dexamethasone (DECADRON) 4 MG tablet   Oral   Take 40 mg by mouth once a week. While on chemo         .  ezetimibe-simvastatin (VYTORIN) 10-20 MG per tablet   Oral   Take 1 tablet by mouth at bedtime.         . fish oil-omega-3 fatty acids 1000 MG capsule   Oral   Take 2 g by mouth daily.           . folic acid (FOLVITE) 800 MCG tablet   Oral   Take 800 mcg by mouth daily.          Marland Kitchen ibuprofen (ADVIL,MOTRIN) 200 MG tablet   Oral   Take 400 mg by mouth every 6 (six) hours as needed for pain or fever.         Marland Kitchen lenalidomide (REVLIMID) 25 MG capsule   Oral   Take 1 capsule (25 mg total) by mouth daily. For 21 days then 7 day rest.   21 capsule   0     Patient to take survey every month and has been no ...   . lisinopril (PRINIVIL,ZESTRIL) 10 MG tablet   Oral   Take 10 mg by mouth daily.           . mometasone (NASONEX) 50 MCG/ACT nasal spray   Nasal   Place 2 sprays into the nose daily.   17 g   12   . sildenafil (VIAGRA) 100 MG tablet   Oral   Take 1 tablet (100 mg total) by mouth as needed for erectile dysfunction.   32 tablet   3     This order faxed to [email or web address] [phone] ...   . vardenafil (LEVITRA) 20 MG tablet   Oral   Take 1 tablet (20 mg total) by mouth daily as needed for erectile dysfunction.   10 tablet   0    BP 118/65  Pulse 90  Temp(Src) 100.4 F (38 C)  Resp 18  SpO2 98% Physical Exam  Nursing note and vitals reviewed.  52 year old male, resting comfortably and in no acute distress. Vital signs are significant for low-grade fever with temperature 100.4. Oxygen saturation is 98%, which is normal. Head is normocephalic and atraumatic. PERRLA, EOMI. Oropharynx is clear. Neck is nontender and supple without adenopathy or JVD. Back is nontender and there is no CVA tenderness. Lungs have faint rales at the right base. No wheezes or rhonchi. Chest is nontender. Heart has regular rate and rhythm with 2/6 early systolic murmur heard along the left sternal border. Abdomen is soft, flat, nontender without masses or  hepatosplenomegaly and peristalsis  is normoactive. Extremities have no cyanosis or edema, full range of motion is present. Skin is warm and dry without rash. Neurologic: Mental status is normal, cranial nerves are intact, there are no motor or sensory deficits.  ED Course   Procedures (including critical care time)  Results for orders placed during the hospital encounter of 12/23/12  CBC WITH DIFFERENTIAL      Result Value Range   WBC 2.9 (*) 4.0 - 10.5 K/uL   RBC 3.96 (*) 4.22 - 5.81 MIL/uL   Hemoglobin 13.2  13.0 - 17.0 g/dL   HCT 40.9 (*) 81.1 - 91.4 %   MCV 93.4  78.0 - 100.0 fL   MCH 33.3  26.0 - 34.0 pg   MCHC 35.7  30.0 - 36.0 g/dL   RDW 78.2  95.6 - 21.3 %   Platelets 149 (*) 150 - 400 K/uL   Neutrophils Relative % 77  43 - 77 %   Neutro Abs 2.3  1.7 - 7.7 K/uL   Lymphocytes Relative 14  12 - 46 %   Lymphs Abs 0.4 (*) 0.7 - 4.0 K/uL   Monocytes Relative 9  3 - 12 %   Monocytes Absolute 0.3  0.1 - 1.0 K/uL   Eosinophils Relative 0  0 - 5 %   Eosinophils Absolute 0.0  0.0 - 0.7 K/uL   Basophils Relative 0  0 - 1 %   Basophils Absolute 0.0  0.0 - 0.1 K/uL  COMPREHENSIVE METABOLIC PANEL      Result Value Range   Sodium 134 (*) 135 - 145 mEq/L   Potassium 3.9  3.5 - 5.1 mEq/L   Chloride 98  96 - 112 mEq/L   CO2 26  19 - 32 mEq/L   Glucose, Bld 103 (*) 70 - 99 mg/dL   BUN 8  6 - 23 mg/dL   Creatinine, Ser 0.86  0.50 - 1.35 mg/dL   Calcium 8.7  8.4 - 57.8 mg/dL   Total Protein 7.6  6.0 - 8.3 g/dL   Albumin 3.7  3.5 - 5.2 g/dL   AST 14  0 - 37 U/L   ALT 16  0 - 53 U/L   Alkaline Phosphatase 31 (*) 39 - 117 U/L   Total Bilirubin 1.1  0.3 - 1.2 mg/dL   GFR calc non Af Amer >90  >90 mL/min   GFR calc Af Amer >90  >90 mL/min   Dg Chest 2 View  12/23/2012   *RADIOLOGY REPORT*  Clinical Data: Cough, shortness of breath and fever.  History of multiple myeloma.  CHEST - 2 VIEW  Comparison: 06/12/2005  Findings: The lungs are clear and show no evidence of infiltrates, edema  or nodules.  No pleural fluid is identified.  Heart size is normal and the patient has had prior CABG. Bony structures are unremarkable.  No focal bony lesions are identified.  IMPRESSION: No active disease.   Original Report Authenticated By: Irish Lack, M.D.     1. Fever   2. Bronchitis   3. Leukopenia     MDM  Respiratory tract infection without evidence of pneumonia on chest x-ray. However, there are some rales heard on exam. He has absolute leukopenia does have an adequate absolute neutrophil count. With his history of multiple myeloma and leukopenia, it is felt better to her on the side of giving antibiotics. He is sent home with prescription for amoxicillin. He is also given prescriptions for albuterol inhaler and benzonatate to use as needed  to help control his cough. He is to followup with his PCP if not improving in 2-3 days.  Dione Booze, MD 12/23/12 [phone]

## 2012-12-23 NOTE — ED Notes (Signed)
PT. REPORTS FEVER / CHILLS WITH PRODUCTIVE COUGH / BODY ACHES ONSET TODAY , PT. TOOK IBUPROFEN AT 9 PM TODAY .

## 2012-12-24 ENCOUNTER — Other Ambulatory Visit: Payer: Self-pay | Admitting: *Deleted

## 2012-12-24 ENCOUNTER — Telehealth: Payer: Self-pay | Admitting: *Deleted

## 2012-12-24 DIAGNOSIS — C9000 Multiple myeloma not having achieved remission: Secondary | ICD-10-CM

## 2012-12-24 MED ORDER — CIPROFLOXACIN HCL 500 MG PO TABS: 500.0000 mg | ORAL_TABLET | Freq: Two times a day (BID) | ORAL | Status: DC

## 2012-12-24 NOTE — Telephone Encounter (Addendum)
Pt's wife called asking "my husband went to ED last night; what should we do about treatment 12/25/12?"  Wife states they went to ED because pt running a fever, coughing and weak. ED prescribed antibiotic; fever this am varies but the highest has been 100.4 and has continued "to cough a lot"  Pt scheduled 7/31 for Velcade injection.  Note to MD.

## 2012-12-24 NOTE — Addendum Note (Signed)
Addended by: Gala Romney on: 12/24/2012 11:56 AM   Modules accepted: Orders

## 2012-12-24 NOTE — Telephone Encounter (Signed)
Per Dr. Cyndie Chime, spoke with pt's wife instructing her that MD will cancel treatment for 12/25/12 and to call our office if persistent temp.  Wife states antibiotic they prescribed is Amoxicillin 500 mg BID.  Per Dr. Cyndie Chime, instructed to stop the Amoxicillin and start Cipro 500 mg twice a day for 7 days.  Pt's wife verbalized understanding of instructions.

## 2012-12-25 ENCOUNTER — Ambulatory Visit: Payer: Self-pay

## 2012-12-25 ENCOUNTER — Other Ambulatory Visit: Payer: Self-pay

## 2012-12-26 ENCOUNTER — Telehealth: Payer: Self-pay | Admitting: *Deleted

## 2012-12-26 NOTE — Telephone Encounter (Signed)
Received vm call from pt's wife reporting that Mayer still is running some fever off & on & last was 100.4 & wanted to know if they should do anything else.  Returned call & pt started Cipro 12/24/12 pm & reports that he is coughing up dark green mucous with some blood in it.  He thinks that the blood is from all the coughing & his throat is sore from the coughing.  He would also like a refill on his tussionex that Dr Hope Budds gave him.  Note to Dr Cyndie Chime.

## 2012-12-26 NOTE — Telephone Encounter (Signed)
Returned call to pt, Dr. Cyndie Chime recommends continuing Cipro, push fluids and call office if temp rises above 100.4. They have not checked temp since that reading this morning. He has about 1 week supply left of Tussionex, instructed him to continue that as needed. Call for elevated temp. He voiced understanding.

## 2012-12-30 ENCOUNTER — Encounter: Payer: Self-pay | Admitting: Family Medicine

## 2012-12-30 ENCOUNTER — Ambulatory Visit (INDEPENDENT_AMBULATORY_CARE_PROVIDER_SITE_OTHER): Payer: BC Managed Care – PPO | Admitting: Family Medicine

## 2012-12-30 ENCOUNTER — Telehealth: Payer: Self-pay | Admitting: Family Medicine

## 2012-12-30 VITALS — BP 118/62 | HR 60 | Temp 98.0°F | Resp 14 | Wt 164.0 lb

## 2012-12-30 DIAGNOSIS — C9000 Multiple myeloma not having achieved remission: Secondary | ICD-10-CM

## 2012-12-30 DIAGNOSIS — Z8701 Personal history of pneumonia (recurrent): Secondary | ICD-10-CM

## 2012-12-30 DIAGNOSIS — J309 Allergic rhinitis, unspecified: Secondary | ICD-10-CM

## 2012-12-30 DIAGNOSIS — D72819 Decreased white blood cell count, unspecified: Secondary | ICD-10-CM

## 2012-12-30 MED ORDER — HYDROCOD POLST-CHLORPHEN POLST 10-8 MG/5ML PO LQCR: 5.0000 mL | Freq: Two times a day (BID) | ORAL | Status: DC | PRN

## 2012-12-30 MED ORDER — CIPROFLOXACIN HCL 500 MG PO TABS: 500.0000 mg | ORAL_TABLET | Freq: Two times a day (BID) | ORAL | Status: DC

## 2012-12-30 NOTE — Progress Notes (Signed)
  Subjective:    Patient ID: [name], male    DOB: 1961-04-08, 52 y.o.   MRN: [id number]  HPI He is here for followup on a recent hospital visit for evaluation for possible pneumonia. He has a history of underlying allergies. He was seen in the hospital 6 days ago. Record was reviewed. He does have a history of multiple myeloma with leukopenia and is in the process of getting chemotherapy with Dr. Cyndie Chime. The ER record showed a normal x-ray however they placed him on Cipro and today is feeling much better. He continues to have difficulty with his allergies mainly with nasal congestion, rhinorrhea, PND and occasional itchy watery eyes. He has tried various allergy medications but usually one at a time.   Review of Systems     Objective:   Physical Exam alert and in no distress. Tympanic membranes and canals are normal. Throat is clear. Tonsils are normal. Neck is supple without adenopathy or thyromegaly. Cardiac exam shows a regular sinus rhythm without murmurs or gallops. Lungs show scattered rhonchi.        Assessment & Plan:  Allergic rhinitis, mild  Leukopenia - Plan: ciprofloxacin (CIPRO) 500 MG tablet  Multiple myeloma, without mention of having achieved remission(203.00)  History of pneumonia - Plan: ciprofloxacin (CIPRO) 500 MG tablet  since he still having some pulmonary symptoms I would like to keep him on Cipro. I will also have him take Claritin, Nasacort and he has Astelin at home. He is to use this regularly for the next 3 weeks and then call me. If no improvement, he is to return here. He will keep his followup appointment with Dr. Cyndie Chime

## 2012-12-30 NOTE — Telephone Encounter (Signed)
Called in tussionnex

## 2012-12-30 NOTE — Telephone Encounter (Signed)
Go ahead and call this in 

## 2012-12-30 NOTE — Patient Instructions (Signed)
Use the Astipro, Nasacort and Claritin altogether for the next 3 weeks and let me know. If you're doing okay will leave you alone if you're still having trouble he needed to come back so we can readjust her meds

## 2012-12-30 NOTE — Telephone Encounter (Signed)
°  Forgot to tell you that he needs refill on his cough meds, Tussionex      [facility] [place]

## 2013-01-02 ENCOUNTER — Telehealth: Payer: Self-pay | Admitting: Oncology

## 2013-01-02 ENCOUNTER — Ambulatory Visit (HOSPITAL_BASED_OUTPATIENT_CLINIC_OR_DEPARTMENT_OTHER): Payer: BC Managed Care – PPO | Admitting: Nurse Practitioner

## 2013-01-02 ENCOUNTER — Ambulatory Visit (HOSPITAL_COMMUNITY)
Admission: RE | Admit: 2013-01-02 | Discharge: 2013-01-02 | Disposition: A | Discharge status: Home / Self Care | Payer: BC Managed Care – PPO | Source: Ambulatory Visit | Attending: Nurse Practitioner | Admitting: Nurse Practitioner

## 2013-01-02 ENCOUNTER — Other Ambulatory Visit (HOSPITAL_BASED_OUTPATIENT_CLINIC_OR_DEPARTMENT_OTHER): Payer: BC Managed Care – PPO | Admitting: Lab

## 2013-01-02 ENCOUNTER — Ambulatory Visit (HOSPITAL_BASED_OUTPATIENT_CLINIC_OR_DEPARTMENT_OTHER): Payer: BC Managed Care – PPO

## 2013-01-02 VITALS — BP 115/67 | HR 65 | Temp 98.6°F | Resp 18 | Ht 65.0 in | Wt 165.2 lb

## 2013-01-02 DIAGNOSIS — R059 Cough, unspecified: Secondary | ICD-10-CM | POA: Insufficient documentation

## 2013-01-02 DIAGNOSIS — Z5112 Encounter for antineoplastic immunotherapy: Secondary | ICD-10-CM

## 2013-01-02 DIAGNOSIS — R05 Cough: Secondary | ICD-10-CM | POA: Insufficient documentation

## 2013-01-02 DIAGNOSIS — R509 Fever, unspecified: Secondary | ICD-10-CM | POA: Insufficient documentation

## 2013-01-02 DIAGNOSIS — C9000 Multiple myeloma not having achieved remission: Secondary | ICD-10-CM

## 2013-01-02 DIAGNOSIS — I10 Essential (primary) hypertension: Secondary | ICD-10-CM

## 2013-01-02 LAB — CBC WITH DIFFERENTIAL/PLATELET
BASO%: 0.7 % (ref 0.0–2.0)
Basophils Absolute: 0 10*3/uL (ref 0.0–0.1)
EOS%: 3.3 % (ref 0.0–7.0)
Eosinophils Absolute: 0.1 10*3/uL (ref 0.0–0.5)
HCT: 37.8 % — ABNORMAL LOW (ref 38.4–49.9)
HGB: 12.8 g/dL — ABNORMAL LOW (ref 13.0–17.1)
LYMPH%: 33.9 % (ref 14.0–49.0)
MCH: 31.9 pg (ref 27.2–33.4)
MCHC: 33.9 g/dL (ref 32.0–36.0)
MCV: 94.3 fL (ref 79.3–98.0)
MONO#: 0.4 10*3/uL (ref 0.1–0.9)
MONO%: 11.5 % (ref 0.0–14.0)
NEUT#: 1.5 10*3/uL (ref 1.5–6.5)
NEUT%: 50.6 % (ref 39.0–75.0)
Platelets: 241 10*3/uL (ref 140–400)
RBC: 4.01 10*6/uL — ABNORMAL LOW (ref 4.20–5.82)
RDW: 13.5 % (ref 11.0–14.6)
WBC: 3 10*3/uL — ABNORMAL LOW (ref 4.0–10.3)
lymph#: 1 10*3/uL (ref 0.9–3.3)

## 2013-01-02 LAB — COMPREHENSIVE METABOLIC PANEL (CC13)
ALT: 30 U/L (ref 0–55)
AST: 18 U/L (ref 5–34)
Albumin: 3.2 g/dL — ABNORMAL LOW (ref 3.5–5.0)
Alkaline Phosphatase: 33 U/L — ABNORMAL LOW (ref 40–150)
BUN: 10.2 mg/dL (ref 7.0–26.0)
CO2: 25 mEq/L (ref 22–29)
Calcium: 8.9 mg/dL (ref 8.4–10.4)
Chloride: 106 mEq/L (ref 98–109)
Creatinine: 0.8 mg/dL (ref 0.7–1.3)
Glucose: 96 mg/dl (ref 70–140)
Potassium: 4.1 mEq/L (ref 3.5–5.1)
Sodium: 139 mEq/L (ref 136–145)
Total Bilirubin: 0.61 mg/dL (ref 0.20–1.20)
Total Protein: 7.8 g/dL (ref 6.4–8.3)

## 2013-01-02 LAB — IGG: IgG (Immunoglobin G), Serum: 2090 mg/dL — ABNORMAL HIGH (ref 650–1600)

## 2013-01-02 MED ORDER — BORTEZOMIB CHEMO SQ INJECTION 3.5 MG (2.5MG/ML)
1.5000 mg/m2 | Freq: Once | INTRAMUSCULAR | Status: AC
  Administered 2013-01-02: 2.75 mg via SUBCUTANEOUS
  Filled 2013-01-02: qty 2.75

## 2013-01-02 MED ORDER — DEXAMETHASONE 4 MG PO TABS: 40.0000 mg | ORAL_TABLET | ORAL | Status: DC

## 2013-01-02 MED ORDER — ONDANSETRON HCL 8 MG PO TABS
8.0000 mg | ORAL_TABLET | Freq: Once | ORAL | Status: AC
  Administered 2013-01-02: 8 mg via ORAL

## 2013-01-02 NOTE — Patient Instructions (Addendum)
[facility] Discharge Instructions for Patients Receiving Chemotherapy  Today you received the following chemotherapy agents: Velcade  To help prevent nausea and vomiting after your treatment, we encourage you to take your nausea medication as directed by your physician and medical team.    If you develop nausea and vomiting that is not controlled by your nausea medication, call the clinic.   BELOW ARE SYMPTOMS THAT SHOULD BE REPORTED IMMEDIATELY:  *FEVER GREATER THAN 100.5 F  *CHILLS WITH OR WITHOUT FEVER  NAUSEA AND VOMITING THAT IS NOT CONTROLLED WITH YOUR NAUSEA MEDICATION  *UNUSUAL SHORTNESS OF BREATH  *UNUSUAL BRUISING OR BLEEDING  TENDERNESS IN MOUTH AND THROAT WITH OR WITHOUT PRESENCE OF ULCERS  *URINARY PROBLEMS  *BOWEL PROBLEMS  UNUSUAL RASH Items with * indicate a potential emergency and should be followed up as soon as possible.  Feel free to call the clinic you have any questions or concerns. The clinic phone number is [phone].

## 2013-01-02 NOTE — Telephone Encounter (Signed)
gv and printed appt sched and avs for pt...emailed MB to add tx.   °

## 2013-01-02 NOTE — Progress Notes (Signed)
OFFICE PROGRESS NOTE  Interval history:   [name] is a 52 year old man recently diagnosed with IgG kappa multiple myeloma presenting with elevation of serum total protein and a mild normochromic anemia. Serum immunoglobulins showed elevation of IgG with concomitant suppression of IgA and IgM. Serum kappa free light chains elevated at 27.9, lambda 0.04, ratio 697.5. Beta-2 microglobulin was mildly elevated at 1.84. Calcium 9.2 with albumin 3.5. 24 urine showed a total of 190 mg of protein with a questionable faint monoclonal band on immunofixation electrophoresis. Skeletal bone survey 10/01/2012 showed a "probable lytic lesion in the left humeral shaft". No other lytic lesions identified. Bone marrow aspiration and biopsy on 10/07/2012 showed 25% plasma cells. He began Velcade 1.5 mg per meter squared squared weekly on 10/24/2012 and Revlimid 25 mg daily for 21 days followed by a 7 day break on 10/24/2012. He takes dexamethasone 40 mg weekly.  There has been improvement in the serum kappa free light chains as well as IgG. Most recent IgG on 12/18/2012 was 1690. Most recent serum light chain analysis on 11/27/2012 showed kappa free light chains 19, lambda free light chains 0.56 and ratio 33.93.  He presented to the emergency Department on 12/23/2012 with fever and cough. Max temperature was 101.4. The cough was initially productive. Chest x-ray showed no evidence of pneumonia. He was started on a course of amoxicillin. Dr. Cyndie Chime subsequently changed the amoxicillin to ciprofloxacin.  Mr. Rosiles reports the fevers resolved and cough began to improve within 48-72 hours of the antibiotic. He never had shortness of breath. He was seen by his primary care doctor recently and given an additional 10 days of Cipro.  He denies any nausea or vomiting associated with the chemotherapy. He has noted increased constipation. The constipation is effectively relieved with MiraLAX. He denies any mouth sores. No  numbness or tingling in his hands or feet.  Objective: Blood pressure 115/67, pulse 65, temperature 98.6 F (37 C), temperature source Oral, resp. rate 18, height 5\' 5"  (1.651 m), weight 165 lb 3.2 oz (74.934 kg).  Oropharynx is without thrush or ulceration. Rales at both lung bases. Regular cardiac rhythm. Abdomen soft and nontender. No organomegaly. Extremities without edema. Calves are soft and nontender. Vibratory sense intact over the fingertips per tuning fork exam.  Lab Results: Lab Results  Component Value Date   WBC 3.0* 01/02/2013   HGB 12.8* 01/02/2013   HCT 37.8* 01/02/2013   MCV 94.3 01/02/2013   PLT 241 01/02/2013    Chemistry:    Chemistry      Component Value Date/Time   NA 139 01/02/2013 1011   NA 134* 12/23/2012 2211   K 4.1 01/02/2013 1011   K 3.9 12/23/2012 2211   CL 98 12/23/2012 2211   CL 103 10/31/2012 1116   CO2 25 01/02/2013 1011   CO2 26 12/23/2012 2211   BUN 10.2 01/02/2013 1011   BUN 8 12/23/2012 2211   CREATININE 0.8 01/02/2013 1011   CREATININE 0.85 12/23/2012 2211   CREATININE 0.89 10/11/2010 0949      Component Value Date/Time   CALCIUM 8.9 01/02/2013 1011   CALCIUM 8.7 12/23/2012 2211   ALKPHOS 33* 01/02/2013 1011   ALKPHOS 31* 12/23/2012 2211   AST 18 01/02/2013 1011   AST 14 12/23/2012 2211   ALT 30 01/02/2013 1011   ALT 16 12/23/2012 2211   BILITOT 0.61 01/02/2013 1011   BILITOT 1.1 12/23/2012 2211       Studies/Results: Dg Chest 2 View  12/23/2012   *  RADIOLOGY REPORT*  Clinical Data: Cough, shortness of breath and fever.  History of multiple myeloma.  CHEST - 2 VIEW  Comparison: 06/12/2005  Findings: The lungs are clear and show no evidence of infiltrates, edema or nodules.  No pleural fluid is identified.  Heart size is normal and the patient has had prior CABG. Bony structures are unremarkable.  No focal bony lesions are identified.  IMPRESSION: No active disease.   Original Report Authenticated By: Irish Lack, M.D.    Medications: I have reviewed the patient's  current medications.  Assessment/Plan:  1. IgG kappa multiple myeloma. He began treatment with RVD on 10/24/2012 and is responding. 2. CAD status post CABG 2005. 3. Mitral valve prolapse. 4. Hypertension. 5. Fever/cough status post evaluation in the emergency department 12/23/2012. Negative chest x-ray. Clinical findings suggestive of pneumonia. Symptoms have resolved/markedly improved coinciding with initiation of antibiotics.  Disposition-he appears stable. Plan to resume treatment today with subcutaneous Velcade. He continues Revlimid/dexamethasone. He has an initial visit with Dr. Barbaraann Boys on 01/08/2013.  We are obtaining a followup chest x-ray today and will contact him with the result.  He will return for a followup visit with Dr. Cyndie Chime on 02/06/2013. He will contact the office in the interim with any problems. We specifically discussed recurrent fever, cough, other signs of infection.  Plan reviewed with Dr. Cyndie Chime.  Lonna Cobb ANP/GNP-BC

## 2013-01-05 ENCOUNTER — Telehealth: Payer: Self-pay | Admitting: *Deleted

## 2013-01-05 NOTE — Telephone Encounter (Signed)
Per phone call from patient's wifw, staff message and POF I have scheduled appts.  JMW

## 2013-01-08 ENCOUNTER — Other Ambulatory Visit: Payer: Self-pay | Admitting: *Deleted

## 2013-01-08 ENCOUNTER — Other Ambulatory Visit: Payer: Self-pay | Admitting: Lab

## 2013-01-08 ENCOUNTER — Ambulatory Visit: Payer: Self-pay

## 2013-01-08 DIAGNOSIS — C9000 Multiple myeloma not having achieved remission: Secondary | ICD-10-CM

## 2013-01-08 NOTE — Telephone Encounter (Signed)
THIS REQUEST FOR REVLIMID WAS PLACED IN DR.GRANFORTUNA'S ACTIVE WORK FOLDER.

## 2013-01-09 ENCOUNTER — Ambulatory Visit (HOSPITAL_BASED_OUTPATIENT_CLINIC_OR_DEPARTMENT_OTHER): Payer: BC Managed Care – PPO

## 2013-01-09 ENCOUNTER — Other Ambulatory Visit: Payer: Self-pay | Admitting: Nurse Practitioner

## 2013-01-09 ENCOUNTER — Other Ambulatory Visit (HOSPITAL_BASED_OUTPATIENT_CLINIC_OR_DEPARTMENT_OTHER): Payer: BC Managed Care – PPO

## 2013-01-09 ENCOUNTER — Other Ambulatory Visit: Payer: Self-pay | Admitting: Oncology

## 2013-01-09 ENCOUNTER — Ambulatory Visit (INDEPENDENT_AMBULATORY_CARE_PROVIDER_SITE_OTHER): Payer: BC Managed Care – PPO | Admitting: Family Medicine

## 2013-01-09 ENCOUNTER — Encounter: Payer: Self-pay | Admitting: Family Medicine

## 2013-01-09 VITALS — BP 110/70 | HR 75 | Wt 167.0 lb

## 2013-01-09 VITALS — BP 104/71 | HR 60 | Temp 97.9°F | Resp 20

## 2013-01-09 DIAGNOSIS — C9000 Multiple myeloma not having achieved remission: Secondary | ICD-10-CM

## 2013-01-09 DIAGNOSIS — Z5112 Encounter for antineoplastic immunotherapy: Secondary | ICD-10-CM

## 2013-01-09 DIAGNOSIS — M7661 Achilles tendinitis, right leg: Secondary | ICD-10-CM

## 2013-01-09 DIAGNOSIS — M7660 Achilles tendinitis, unspecified leg: Secondary | ICD-10-CM

## 2013-01-09 LAB — CBC WITH DIFFERENTIAL/PLATELET
BASO%: 1.5 % (ref 0.0–2.0)
Basophils Absolute: 0 10*3/uL (ref 0.0–0.1)
EOS%: 7.3 % — ABNORMAL HIGH (ref 0.0–7.0)
Eosinophils Absolute: 0.2 10*3/uL (ref 0.0–0.5)
HCT: 37.3 % — ABNORMAL LOW (ref 38.4–49.9)
HGB: 12.5 g/dL — ABNORMAL LOW (ref 13.0–17.1)
LYMPH%: 27.1 % (ref 14.0–49.0)
MCH: 31.7 pg (ref 27.2–33.4)
MCHC: 33.5 g/dL (ref 32.0–36.0)
MCV: 94.7 fL (ref 79.3–98.0)
MONO#: 0.7 10*3/uL (ref 0.1–0.9)
MONO%: 23.8 % — ABNORMAL HIGH (ref 0.0–14.0)
NEUT#: 1.1 10*3/uL — ABNORMAL LOW (ref 1.5–6.5)
NEUT%: 40.3 % (ref 39.0–75.0)
Platelets: 157 10*3/uL (ref 140–400)
RBC: 3.94 10*6/uL — ABNORMAL LOW (ref 4.20–5.82)
RDW: 13.9 % (ref 11.0–14.6)
WBC: 2.7 10*3/uL — ABNORMAL LOW (ref 4.0–10.3)
lymph#: 0.7 10*3/uL — ABNORMAL LOW (ref 0.9–3.3)

## 2013-01-09 MED ORDER — BORTEZOMIB CHEMO SQ INJECTION 3.5 MG (2.5MG/ML)
1.3000 mg/m2 | Freq: Once | INTRAMUSCULAR | Status: AC
  Administered 2013-01-09: 2.5 mg via SUBCUTANEOUS
  Filled 2013-01-09: qty 2.5

## 2013-01-09 MED ORDER — ONDANSETRON HCL 8 MG PO TABS
8.0000 mg | ORAL_TABLET | Freq: Once | ORAL | Status: AC
  Administered 2013-01-09: 8 mg via ORAL

## 2013-01-09 MED ORDER — LENALIDOMIDE 25 MG PO CAPS: 25.0000 mg | ORAL_CAPSULE | Freq: Every day | ORAL | Status: DC

## 2013-01-09 NOTE — Patient Instructions (Signed)
Heat for 20 minutes 3 times per day. 4 ibuprofen 3 times per day. Use heel cups or you can take card board and cut up to the former. She'll Included in your shoe

## 2013-01-09 NOTE — Patient Instructions (Addendum)
[facility] Discharge Instructions for Patients Receiving Chemotherapy  Today you received the following chemotherapy agents: Velcade.  To help prevent nausea and vomiting after your treatment, we encourage you to take your nausea medication as prescribed.   If you develop nausea and vomiting that is not controlled by your nausea medication, call the clinic.   BELOW ARE SYMPTOMS THAT SHOULD BE REPORTED IMMEDIATELY:  *FEVER GREATER THAN 100.5 F  *CHILLS WITH OR WITHOUT FEVER  NAUSEA AND VOMITING THAT IS NOT CONTROLLED WITH YOUR NAUSEA MEDICATION  *UNUSUAL SHORTNESS OF BREATH  *UNUSUAL BRUISING OR BLEEDING  TENDERNESS IN MOUTH AND THROAT WITH OR WITHOUT PRESENCE OF ULCERS  *URINARY PROBLEMS  *BOWEL PROBLEMS  UNUSUAL RASH Items with * indicate a potential emergency and should be followed up as soon as possible.  Feel free to call the clinic you have any questions or concerns. The clinic phone number is [phone].    

## 2013-01-09 NOTE — Progress Notes (Signed)
Per Dr. Cyndie Chime, it's OK to treat despite lab counts.

## 2013-01-09 NOTE — Progress Notes (Signed)
  Subjective:    Patient ID: [name], male    DOB: [birth year], 52 y.o.   MRN: [id number]  HPI Tuesday of this week he jumped off the back of a low voided didn't feel any discomfort until the next morning we will cup with right lateral Achilles tendon discomfort.   Review of Systems     Objective:   Physical Exam Full motion of the foot and ankle with tenderness to palpation the lateral insertion of the Achilles tendon. The rest the tendon is nontender. Retrocalcaneal area is normal. No other point tenderness other joints.      Assessment & Plan:  Achilles tendinitis of right lower extremity  commend heat, ibuprofen and a slight heel lift to take the pressure off. If continued difficulty he is to return here.

## 2013-01-09 NOTE — Addendum Note (Signed)
Addended by: Arvilla Meres on: 01/09/2013 05:27 PM   Modules accepted: Orders

## 2013-01-13 NOTE — Telephone Encounter (Signed)
RECEIVED A FAX FROM [employer] CONCERNING A CONFIRMATION OF PRESCRIPTION SHIPMENT FOR REVLIMID ON 01/12/13. 

## 2013-01-15 ENCOUNTER — Ambulatory Visit (HOSPITAL_BASED_OUTPATIENT_CLINIC_OR_DEPARTMENT_OTHER): Payer: BC Managed Care – PPO

## 2013-01-15 ENCOUNTER — Other Ambulatory Visit (HOSPITAL_BASED_OUTPATIENT_CLINIC_OR_DEPARTMENT_OTHER): Payer: BC Managed Care – PPO | Admitting: Lab

## 2013-01-15 VITALS — BP 122/71 | HR 59 | Temp 98.2°F

## 2013-01-15 DIAGNOSIS — C9000 Multiple myeloma not having achieved remission: Secondary | ICD-10-CM

## 2013-01-15 DIAGNOSIS — Z5112 Encounter for antineoplastic immunotherapy: Secondary | ICD-10-CM

## 2013-01-15 LAB — CBC WITH DIFFERENTIAL/PLATELET
BASO%: 1.2 % (ref 0.0–2.0)
Basophils Absolute: 0 10*3/uL (ref 0.0–0.1)
EOS%: 1 % (ref 0.0–7.0)
Eosinophils Absolute: 0 10*3/uL (ref 0.0–0.5)
HCT: 38 % — ABNORMAL LOW (ref 38.4–49.9)
HGB: 13.2 g/dL (ref 13.0–17.1)
LYMPH%: 34.9 % (ref 14.0–49.0)
MCH: 33 pg (ref 27.2–33.4)
MCHC: 34.7 g/dL (ref 32.0–36.0)
MCV: 95.3 fL (ref 79.3–98.0)
MONO#: 0.3 10*3/uL (ref 0.1–0.9)
MONO%: 11.7 % (ref 0.0–14.0)
NEUT#: 1.5 10*3/uL (ref 1.5–6.5)
NEUT%: 51.2 % (ref 39.0–75.0)
Platelets: 168 10*3/uL (ref 140–400)
RBC: 3.99 10*6/uL — ABNORMAL LOW (ref 4.20–5.82)
RDW: 15 % — ABNORMAL HIGH (ref 11.0–14.6)
WBC: 2.9 10*3/uL — ABNORMAL LOW (ref 4.0–10.3)
lymph#: 1 10*3/uL (ref 0.9–3.3)

## 2013-01-15 MED ORDER — BORTEZOMIB CHEMO SQ INJECTION 3.5 MG (2.5MG/ML)
1.3000 mg/m2 | Freq: Once | INTRAMUSCULAR | Status: AC
  Administered 2013-01-15: 2.5 mg via SUBCUTANEOUS
  Filled 2013-01-15: qty 2.5

## 2013-01-15 MED ORDER — ONDANSETRON HCL 8 MG PO TABS
8.0000 mg | ORAL_TABLET | Freq: Once | ORAL | Status: AC
  Administered 2013-01-15: 8 mg via ORAL

## 2013-01-15 NOTE — Patient Instructions (Addendum)
[facility] Discharge Instructions for Patients Receiving Chemotherapy  Today you received the following chemotherapy agents:  Velcade  To help prevent nausea and vomiting after your treatment, we encourage you to take your nausea medication as ordered per MD.   If you develop nausea and vomiting that is not controlled by your nausea medication, call the clinic.   BELOW ARE SYMPTOMS THAT SHOULD BE REPORTED IMMEDIATELY:  *FEVER GREATER THAN 100.5 F  *CHILLS WITH OR WITHOUT FEVER  NAUSEA AND VOMITING THAT IS NOT CONTROLLED WITH YOUR NAUSEA MEDICATION  *UNUSUAL SHORTNESS OF BREATH  *UNUSUAL BRUISING OR BLEEDING  TENDERNESS IN MOUTH AND THROAT WITH OR WITHOUT PRESENCE OF ULCERS  *URINARY PROBLEMS  *BOWEL PROBLEMS  UNUSUAL RASH Items with * indicate a potential emergency and should be followed up as soon as possible.  Feel free to call the clinic you have any questions or concerns. The clinic phone number is [phone].    

## 2013-01-21 ENCOUNTER — Encounter: Payer: Self-pay | Admitting: *Deleted

## 2013-01-22 ENCOUNTER — Ambulatory Visit (HOSPITAL_BASED_OUTPATIENT_CLINIC_OR_DEPARTMENT_OTHER): Payer: BC Managed Care – PPO

## 2013-01-22 ENCOUNTER — Other Ambulatory Visit (HOSPITAL_BASED_OUTPATIENT_CLINIC_OR_DEPARTMENT_OTHER): Payer: BC Managed Care – PPO | Admitting: Lab

## 2013-01-22 VITALS — BP 129/82 | HR 59 | Temp 97.9°F | Resp 18

## 2013-01-22 DIAGNOSIS — C9000 Multiple myeloma not having achieved remission: Secondary | ICD-10-CM

## 2013-01-22 DIAGNOSIS — Z5112 Encounter for antineoplastic immunotherapy: Secondary | ICD-10-CM

## 2013-01-22 LAB — CBC WITH DIFFERENTIAL/PLATELET
BASO%: 0.6 % (ref 0.0–2.0)
Basophils Absolute: 0 10*3/uL (ref 0.0–0.1)
EOS%: 3.3 % (ref 0.0–7.0)
Eosinophils Absolute: 0.1 10*3/uL (ref 0.0–0.5)
HCT: 38.8 % (ref 38.4–49.9)
HGB: 13.5 g/dL (ref 13.0–17.1)
LYMPH%: 33.7 % (ref 14.0–49.0)
MCH: 33.1 pg (ref 27.2–33.4)
MCHC: 34.7 g/dL (ref 32.0–36.0)
MCV: 95.2 fL (ref 79.3–98.0)
MONO#: 0.3 10*3/uL (ref 0.1–0.9)
MONO%: 9.1 % (ref 0.0–14.0)
NEUT#: 1.8 10*3/uL (ref 1.5–6.5)
NEUT%: 53.3 % (ref 39.0–75.0)
Platelets: 152 10*3/uL (ref 140–400)
RBC: 4.08 10*6/uL — ABNORMAL LOW (ref 4.20–5.82)
RDW: 14.8 % — ABNORMAL HIGH (ref 11.0–14.6)
WBC: 3.4 10*3/uL — ABNORMAL LOW (ref 4.0–10.3)
lymph#: 1.2 10*3/uL (ref 0.9–3.3)

## 2013-01-22 MED ORDER — ONDANSETRON HCL 8 MG PO TABS
8.0000 mg | ORAL_TABLET | Freq: Once | ORAL | Status: AC
  Administered 2013-01-22: 8 mg via ORAL

## 2013-01-22 MED ORDER — BORTEZOMIB CHEMO SQ INJECTION 3.5 MG (2.5MG/ML)
1.3000 mg/m2 | Freq: Once | INTRAMUSCULAR | Status: AC
  Administered 2013-01-22: 2.5 mg via SUBCUTANEOUS
  Filled 2013-01-22: qty 2.5

## 2013-01-22 NOTE — Patient Instructions (Addendum)
[facility] Discharge Instructions for Patients Receiving Chemotherapy  Today you received the following chemotherapy agent: Velcade   To help prevent nausea and vomiting after your treatment, we encourage you to take your nausea medication as directed by your physician.   If you develop nausea and vomiting that is not controlled by your nausea medication, call the clinic.   BELOW ARE SYMPTOMS THAT SHOULD BE REPORTED IMMEDIATELY:  *FEVER GREATER THAN 100.5 F  *CHILLS WITH OR WITHOUT FEVER  NAUSEA AND VOMITING THAT IS NOT CONTROLLED WITH YOUR NAUSEA MEDICATION  *UNUSUAL SHORTNESS OF BREATH  *UNUSUAL BRUISING OR BLEEDING  TENDERNESS IN MOUTH AND THROAT WITH OR WITHOUT PRESENCE OF ULCERS  *URINARY PROBLEMS  *BOWEL PROBLEMS  UNUSUAL RASH Items with * indicate a potential emergency and should be followed up as soon as possible.  Feel free to call the clinic you have any questions or concerns. The clinic phone number is [phone].    

## 2013-01-27 ENCOUNTER — Ambulatory Visit: Payer: BC Managed Care – PPO | Admitting: Cardiology

## 2013-01-29 ENCOUNTER — Ambulatory Visit (HOSPITAL_BASED_OUTPATIENT_CLINIC_OR_DEPARTMENT_OTHER): Payer: BC Managed Care – PPO

## 2013-01-29 ENCOUNTER — Other Ambulatory Visit (HOSPITAL_BASED_OUTPATIENT_CLINIC_OR_DEPARTMENT_OTHER): Payer: BC Managed Care – PPO | Admitting: Lab

## 2013-01-29 VITALS — BP 123/76 | HR 59 | Temp 97.9°F | Resp 20

## 2013-01-29 DIAGNOSIS — C9000 Multiple myeloma not having achieved remission: Secondary | ICD-10-CM

## 2013-01-29 DIAGNOSIS — Z5112 Encounter for antineoplastic immunotherapy: Secondary | ICD-10-CM

## 2013-01-29 LAB — COMPREHENSIVE METABOLIC PANEL (CC13)
ALT: 19 U/L (ref 0–55)
AST: 14 U/L (ref 5–34)
Albumin: 3.4 g/dL — ABNORMAL LOW (ref 3.5–5.0)
Alkaline Phosphatase: 30 U/L — ABNORMAL LOW (ref 40–150)
BUN: 14.5 mg/dL (ref 7.0–26.0)
CO2: 23 mEq/L (ref 22–29)
Calcium: 8.5 mg/dL (ref 8.4–10.4)
Chloride: 109 mEq/L (ref 98–109)
Creatinine: 0.8 mg/dL (ref 0.7–1.3)
Glucose: 102 mg/dl (ref 70–140)
Potassium: 4 mEq/L (ref 3.5–5.1)
Sodium: 141 mEq/L (ref 136–145)
Total Bilirubin: 1.03 mg/dL (ref 0.20–1.20)
Total Protein: 7 g/dL (ref 6.4–8.3)

## 2013-01-29 LAB — CBC WITH DIFFERENTIAL/PLATELET
BASO%: 0.8 % (ref 0.0–2.0)
Basophils Absolute: 0 10*3/uL (ref 0.0–0.1)
EOS%: 3.1 % (ref 0.0–7.0)
Eosinophils Absolute: 0.1 10*3/uL (ref 0.0–0.5)
HCT: 38.9 % (ref 38.4–49.9)
HGB: 13.2 g/dL (ref 13.0–17.1)
LYMPH%: 29 % (ref 14.0–49.0)
MCH: 32.3 pg (ref 27.2–33.4)
MCHC: 33.8 g/dL (ref 32.0–36.0)
MCV: 95.5 fL (ref 79.3–98.0)
MONO#: 0.8 10*3/uL (ref 0.1–0.9)
MONO%: 19.2 % — ABNORMAL HIGH (ref 0.0–14.0)
NEUT#: 1.9 10*3/uL (ref 1.5–6.5)
NEUT%: 47.9 % (ref 39.0–75.0)
Platelets: 162 10*3/uL (ref 140–400)
RBC: 4.07 10*6/uL — ABNORMAL LOW (ref 4.20–5.82)
RDW: 14.9 % — ABNORMAL HIGH (ref 11.0–14.6)
WBC: 4.1 10*3/uL (ref 4.0–10.3)
lymph#: 1.2 10*3/uL (ref 0.9–3.3)

## 2013-01-29 LAB — LACTATE DEHYDROGENASE (CC13): LDH: 153 U/L (ref 125–245)

## 2013-01-29 MED ORDER — ONDANSETRON HCL 8 MG PO TABS
8.0000 mg | ORAL_TABLET | Freq: Once | ORAL | Status: AC
  Administered 2013-01-29: 8 mg via ORAL

## 2013-01-29 MED ORDER — BORTEZOMIB CHEMO SQ INJECTION 3.5 MG (2.5MG/ML)
1.3000 mg/m2 | Freq: Once | INTRAMUSCULAR | Status: AC
  Administered 2013-01-29: 2.5 mg via SUBCUTANEOUS
  Filled 2013-01-29: qty 2.5

## 2013-01-29 NOTE — Patient Instructions (Addendum)
[facility] Discharge Instructions for Patients Receiving Chemotherapy  Today you received the following chemotherapy agents VELCADE SQ  To help prevent nausea and vomiting after your treatment, we encourage you to take your nausea medication if needed   If you develop nausea and vomiting that is not controlled by your nausea medication, call the clinic.   BELOW ARE SYMPTOMS THAT SHOULD BE REPORTED IMMEDIATELY:  *FEVER GREATER THAN 100.5 F  *CHILLS WITH OR WITHOUT FEVER  NAUSEA AND VOMITING THAT IS NOT CONTROLLED WITH YOUR NAUSEA MEDICATION  *UNUSUAL SHORTNESS OF BREATH  *UNUSUAL BRUISING OR BLEEDING  TENDERNESS IN MOUTH AND THROAT WITH OR WITHOUT PRESENCE OF ULCERS  *URINARY PROBLEMS  *BOWEL PROBLEMS  UNUSUAL RASH Items with * indicate a potential emergency and should be followed up as soon as possible.  Feel free to call the clinic you have any questions or concerns. The clinic phone number is [phone].    

## 2013-01-30 LAB — KAPPA/LAMBDA LIGHT CHAINS
Kappa free light chain: 5.45 mg/dL — ABNORMAL HIGH (ref 0.33–1.94)
Kappa:Lambda Ratio: 54.5 — ABNORMAL HIGH (ref 0.26–1.65)
Lambda Free Lght Chn: 0.1 mg/dL — ABNORMAL LOW (ref 0.57–2.63)

## 2013-01-30 LAB — IGG: IgG (Immunoglobin G), Serum: 1670 mg/dL — ABNORMAL HIGH (ref 650–1600)

## 2013-02-03 ENCOUNTER — Encounter: Payer: Self-pay | Admitting: Oncology

## 2013-02-03 NOTE — Progress Notes (Signed)
Faxed pt enrollment application- Physician information to [employer] Co-Pay Assistance Program.

## 2013-02-05 ENCOUNTER — Other Ambulatory Visit: Payer: Self-pay | Admitting: *Deleted

## 2013-02-05 NOTE — Telephone Encounter (Signed)
THIS REFILL REQUEST FOR REVLIMID WAS PLACED IN DR.GRANFORTUNA'S ACTIVE WORK BOX. 

## 2013-02-06 ENCOUNTER — Telehealth: Payer: Self-pay | Admitting: Oncology

## 2013-02-06 ENCOUNTER — Telehealth: Payer: Self-pay | Admitting: *Deleted

## 2013-02-06 ENCOUNTER — Ambulatory Visit (HOSPITAL_BASED_OUTPATIENT_CLINIC_OR_DEPARTMENT_OTHER): Payer: BC Managed Care – PPO | Admitting: Oncology

## 2013-02-06 ENCOUNTER — Other Ambulatory Visit (HOSPITAL_BASED_OUTPATIENT_CLINIC_OR_DEPARTMENT_OTHER): Payer: BC Managed Care – PPO

## 2013-02-06 ENCOUNTER — Ambulatory Visit (HOSPITAL_BASED_OUTPATIENT_CLINIC_OR_DEPARTMENT_OTHER): Payer: BC Managed Care – PPO

## 2013-02-06 VITALS — BP 119/80 | HR 63 | Temp 97.8°F | Resp 19 | Ht 65.0 in | Wt 172.0 lb

## 2013-02-06 DIAGNOSIS — C9000 Multiple myeloma not having achieved remission: Secondary | ICD-10-CM

## 2013-02-06 DIAGNOSIS — Z5112 Encounter for antineoplastic immunotherapy: Secondary | ICD-10-CM

## 2013-02-06 LAB — CBC WITH DIFFERENTIAL/PLATELET
BASO%: 0.7 % (ref 0.0–2.0)
Basophils Absolute: 0 10*3/uL (ref 0.0–0.1)
EOS%: 6.2 % (ref 0.0–7.0)
Eosinophils Absolute: 0.2 10*3/uL (ref 0.0–0.5)
HCT: 37.4 % — ABNORMAL LOW (ref 38.4–49.9)
HGB: 13 g/dL (ref 13.0–17.1)
LYMPH%: 25.9 % (ref 14.0–49.0)
MCH: 32.3 pg (ref 27.2–33.4)
MCHC: 34.8 g/dL (ref 32.0–36.0)
MCV: 92.8 fL (ref 79.3–98.0)
MONO#: 0.3 10*3/uL (ref 0.1–0.9)
MONO%: 10.2 % (ref 0.0–14.0)
NEUT#: 1.6 10*3/uL (ref 1.5–6.5)
NEUT%: 57 % (ref 39.0–75.0)
Platelets: 127 10*3/uL — ABNORMAL LOW (ref 140–400)
RBC: 4.03 10*6/uL — ABNORMAL LOW (ref 4.20–5.82)
RDW: 14.1 % (ref 11.0–14.6)
WBC: 2.7 10*3/uL — ABNORMAL LOW (ref 4.0–10.3)
lymph#: 0.7 10*3/uL — ABNORMAL LOW (ref 0.9–3.3)
nRBC: 0 % (ref 0–0)

## 2013-02-06 LAB — COMPREHENSIVE METABOLIC PANEL (CC13)
ALT: 21 U/L (ref 0–55)
AST: 17 U/L (ref 5–34)
Albumin: 3.3 g/dL — ABNORMAL LOW (ref 3.5–5.0)
Alkaline Phosphatase: 30 U/L — ABNORMAL LOW (ref 40–150)
BUN: 13.9 mg/dL (ref 7.0–26.0)
CO2: 25 mEq/L (ref 22–29)
Calcium: 8.5 mg/dL (ref 8.4–10.4)
Chloride: 108 mEq/L (ref 98–109)
Creatinine: 0.8 mg/dL (ref 0.7–1.3)
Glucose: 99 mg/dl (ref 70–140)
Potassium: 4.1 mEq/L (ref 3.5–5.1)
Sodium: 140 mEq/L (ref 136–145)
Total Bilirubin: 0.79 mg/dL (ref 0.20–1.20)
Total Protein: 7.1 g/dL (ref 6.4–8.3)

## 2013-02-06 LAB — IGG: IgG (Immunoglobin G), Serum: 1500 mg/dL (ref 650–1600)

## 2013-02-06 MED ORDER — BORTEZOMIB CHEMO SQ INJECTION 3.5 MG (2.5MG/ML)
1.3000 mg/m2 | Freq: Once | INTRAMUSCULAR | Status: AC
  Administered 2013-02-06: 2.5 mg via SUBCUTANEOUS
  Filled 2013-02-06: qty 2.5

## 2013-02-06 MED ORDER — ONDANSETRON HCL 8 MG PO TABS
8.0000 mg | ORAL_TABLET | Freq: Once | ORAL | Status: AC
  Administered 2013-02-06: 8 mg via ORAL

## 2013-02-06 MED ORDER — ONDANSETRON HCL 8 MG PO TABS
ORAL_TABLET | ORAL | Status: AC
  Filled 2013-02-06: qty 1

## 2013-02-06 MED ORDER — LENALIDOMIDE 25 MG PO CAPS: 25.0000 mg | ORAL_CAPSULE | Freq: Every day | ORAL | Status: DC

## 2013-02-06 NOTE — Telephone Encounter (Signed)
gv pt appt schedule for September and October.  °

## 2013-02-06 NOTE — Telephone Encounter (Signed)
Set up BMBX with Michelle/[facility]/Scheduler for 02/11/13 & also with Donna/[facility]/Flow& Cyto for 9am.

## 2013-02-06 NOTE — Patient Instructions (Signed)
[facility] Discharge Instructions for Patients Receiving Chemotherapy  Today you received the following chemotherapy agents: Velcade.  To help prevent nausea and vomiting after your treatment, we encourage you to take your nausea medication as prescribed.   If you develop nausea and vomiting that is not controlled by your nausea medication, call the clinic.   BELOW ARE SYMPTOMS THAT SHOULD BE REPORTED IMMEDIATELY:  *FEVER GREATER THAN 100.5 F  *CHILLS WITH OR WITHOUT FEVER  NAUSEA AND VOMITING THAT IS NOT CONTROLLED WITH YOUR NAUSEA MEDICATION  *UNUSUAL SHORTNESS OF BREATH  *UNUSUAL BRUISING OR BLEEDING  TENDERNESS IN MOUTH AND THROAT WITH OR WITHOUT PRESENCE OF ULCERS  *URINARY PROBLEMS  *BOWEL PROBLEMS  UNUSUAL RASH Items with * indicate a potential emergency and should be followed up as soon as possible.  Feel free to call the clinic you have any questions or concerns. The clinic phone number is [phone].    

## 2013-02-07 NOTE — Progress Notes (Signed)
Hematology and Oncology Follow Up Visit  [name] [id number] Aug 03, 1960 52 y.o. 02/07/2013 4:19 PM   Principle Diagnosis: Encounter Diagnosis  Name Primary?  . Multiple myeloma, without mention of having achieved remission(203.00) Yes     Interim History:   Followup visit for this 52 year old man under active treatment for IgG kappa multiple myeloma  Diagnosed in May of this year when he presented with a mild, normochromic anemia. He has a low tumor burden with total IgG of 4 g, serum free light chains 27.9 mg percent, normal renal function, normal calcium and albumin, minimal elevation of beta-2 microglobulin, minimal proteinuria 190 mg, and a questionable single lytic lesion in the left humerus. Bone marrow biopsy done 10/07/2012 with 25% plasma cells. He elected to take aggressive treatment and was started on RVD beginning on May 30. Velcade 1.5 mg per meter squared subcutaneous weekly, Revlimid 25 mg by mouth days 1 through 21 of a 28 day cycle, and dexamethasone 40 mg weekly. Overall he has tolerated therapy well and has not required any dose reductions.  He did have a setback with the development of fever and cough on July 29. No infiltrates on chest radiograph. We were able to treat him as an outpatient initially with amoxicillin and then Cipro. He never developed absolute granulocytopenia. We have been able to keep him on schedule with his treatments.  Since last visit, he did have a consultation by Dr. Garth Bigness at [facility] in anticipation of consolidation chemotherapy with IV melphalan with autologous stem cell support. He is felt to be an acceptable candidate but will need a cardiac reevaluation in view of his known coronary and valvular heart disease. He will need the standard and re evaluation studies including echocardiogram, pulmonary functions with DLCO, EKG, and chest x-ray. I will do a bone marrow biopsy next week to assess response so  far.  Secondary to logistical considerations and patient preference, induction therapy will be extended to a total of 5 cycles (2 additional cycles in addition to the treatment he has received already.)  Medications: reviewed  Allergies:  Allergies  Allergen Reactions  . Toprol Xl [Metoprolol Tartrate]     intolerance    Review of Systems: Constitutional:   Resolved constitutional symptoms HEENT no sore throat no, no oral ulcers Respiratory: No cough or dyspnea Cardiovascular:  No chest pain or palpitations Gastrointestinal: No abdominal pain or change in bowel habit Genito-Urinary: No urinary tract symptoms Musculoskeletal: No muscle bone or joint pain Neurologic: No headache, no change in vision, no focal weakness Skin: No rash  Remaining ROS negative.     Physical Exam: Blood pressure 119/80, pulse 63, temperature 97.8 F (36.6 C), temperature source Oral, resp. rate 19, height 5\' 5"  (1.651 m), weight 172 lb (78.019 kg). Wt Readings from Last 3 Encounters:  02/06/13 172 lb (78.019 kg)  01/09/13 167 lb (75.751 kg)  01/02/13 165 lb 3.2 oz (74.934 kg)     General appearance: Well-nourished Caucasian man HENNT: Pharynx no erythema, exudate, or ulcer. No thyromegaly. Lymph nodes: No lymphadenopathy in the neck, supraclavicular, or cervical areas Breasts: Lungs: Clear to auscultation resonant to percussion Heart: Regular rhythm, 3/6 systolic murmur left sternal border and 2/6 systolic murmur second right intercostal space Abdomen: Soft, nontender, no mass, no organomegaly Extremities: No edema, no calf tenderness Musculoskeletal: No joint deformities GU: Vascular: No cyanosis, no carotid bruits Neurologic: Alert and oriented, mental status intact, PERRLA, motor strength 5 over 5, reflexes 1+ symmetric, sensation  intact to vibration over the fingertips Skin: No rash or ecchymosis  Lab Results: Lab Results  Component Value Date   WBC 2.7* 02/06/2013   HGB 13.0  02/06/2013   HCT 37.4* 02/06/2013   MCV 92.8 02/06/2013   PLT 127* 02/06/2013     Chemistry      Component Value Date/Time   NA 140 02/06/2013 1007   NA 134* 12/23/2012 2211   K 4.1 02/06/2013 1007   K 3.9 12/23/2012 2211   CL 98 12/23/2012 2211   CL 103 10/31/2012 1116   CO2 25 02/06/2013 1007   CO2 26 12/23/2012 2211   BUN 13.9 02/06/2013 1007   BUN 8 12/23/2012 2211   CREATININE 0.8 02/06/2013 1007   CREATININE 0.85 12/23/2012 2211   CREATININE 0.89 10/11/2010 0949      Component Value Date/Time   CALCIUM 8.5 02/06/2013 1007   CALCIUM 8.7 12/23/2012 2211   ALKPHOS 30* 02/06/2013 1007   ALKPHOS 31* 12/23/2012 2211   AST 17 02/06/2013 1007   AST 14 12/23/2012 2211   ALT 21 02/06/2013 1007   ALT 16 12/23/2012 2211   BILITOT 0.79 02/06/2013 1007   BILITOT 1.1 12/23/2012 2211    IgG 1.5 g Kappa free light chains: 5.45 mg percent; ratio K./L. 54.5 compared with 27.9 and 698 at diagnosis in April.  Impression: #1 IgG kappa multiple myeloma responding to treatment with RVD Plan: Continue current treatment. Restaging evaluation next week. Cardiac reevaluation in anticipation of high-dose consolidation chemotherapy.  #2. Coronary artery disease status post bypass surgery 2005.  #3. Mitral and aortic valve disease. Asymptomatic.  #4. Hypertension  #5. Resolved pneumonitis.    CC:. Dr. Sharlot Gowda; Dr. Bryan Lemma; Dr. Garth Bigness at [facility], [place] 9/13/20144:19 PM

## 2013-02-10 ENCOUNTER — Other Ambulatory Visit: Payer: Self-pay | Admitting: *Deleted

## 2013-02-10 DIAGNOSIS — C9000 Multiple myeloma not having achieved remission: Secondary | ICD-10-CM

## 2013-02-10 MED ORDER — ACYCLOVIR 400 MG PO TABS: 400.0000 mg | ORAL_TABLET | Freq: Two times a day (BID) | ORAL | Status: DC

## 2013-02-10 NOTE — Telephone Encounter (Signed)
RECEIVED A FAX FROM [employer] CONCERNING A CONFIRMATION OF PRESCRIPTION SHIPMENT FOR REVLIMID ON 02/09/13 

## 2013-02-11 ENCOUNTER — Ambulatory Visit (HOSPITAL_BASED_OUTPATIENT_CLINIC_OR_DEPARTMENT_OTHER): Payer: BC Managed Care – PPO | Admitting: Oncology

## 2013-02-11 ENCOUNTER — Other Ambulatory Visit: Payer: BC Managed Care – PPO | Admitting: Lab

## 2013-02-11 ENCOUNTER — Other Ambulatory Visit (HOSPITAL_COMMUNITY)
Admission: RE | Admit: 2013-02-11 | Discharge: 2013-02-11 | Disposition: A | Discharge status: Home / Self Care | Payer: BC Managed Care – PPO | Source: Ambulatory Visit | Attending: Oncology | Admitting: Oncology

## 2013-02-11 VITALS — BP 131/77 | HR 67 | Temp 97.5°F | Resp 20

## 2013-02-11 DIAGNOSIS — C9000 Multiple myeloma not having achieved remission: Secondary | ICD-10-CM | POA: Insufficient documentation

## 2013-02-11 LAB — CBC
HCT: 38.5 % — ABNORMAL LOW (ref 39.0–52.0)
Hemoglobin: 13.3 g/dL (ref 13.0–17.0)
MCH: 32.1 pg (ref 26.0–34.0)
MCHC: 34.5 g/dL (ref 30.0–36.0)
MCV: 93 fL (ref 78.0–100.0)
Platelets: 157 10*3/uL (ref 150–400)
RBC: 4.14 MIL/uL — ABNORMAL LOW (ref 4.22–5.81)
RDW: 13.9 % (ref 11.5–15.5)
WBC: 2.8 10*3/uL — ABNORMAL LOW (ref 4.0–10.5)

## 2013-02-11 LAB — BONE MARROW EXAM

## 2013-02-11 NOTE — Progress Notes (Signed)
Bone Marrow Biopsy and Aspiration Procedure Note   Informed consent was obtained and potential risks including bleeding, infection and pain were reviewed with the patient.  Red Rule procedure followed  Posterior iliac crest(s) prepped with Betadine.  Lidocaine 2% local anesthesia infiltrated into the subcutaneous tissue.  Premedication: Ativan 2 mg by mouth  Left bone marrow biopsy and  aspirate was obtained.   The procedure was tolerated well and there were no complications.  Specimens sent for routine histopathologic stains and sectioning, and cytogenetics  Indication: Evaluate treatment response following 3 cycles of RVD for IgG multiple myeloma  Physician: Levert Feinstein

## 2013-02-11 NOTE — Patient Instructions (Addendum)
Bone Marrow Aspiration, Bone Marrow Biopsy  Care After  Read the instructions outlined below and refer to this sheet in the next few weeks. These discharge instructions provide you with general information on caring for yourself after you leave the hospital. Your caregiver may also give you specific instructions. While your treatment has been planned according to the most current medical practices available, unavoidable complications occasionally occur. If you have any problems or questions after discharge, call your caregiver.  FINDING OUT THE RESULTS OF YOUR TEST  Not all test results are available during your visit. If your test results are not back during the visit, make an appointment with your caregiver to find out the results. Do not assume everything is normal if you have not heard from your caregiver or the medical facility. It is important for you to follow up on all of your test results.   HOME CARE INSTRUCTIONS   You have had sedation and may be sleepy or dizzy. Your thinking may not be as clear as usual. For the next 24 hours:   Only take over-the-counter or prescription medicines for pain, discomfort, and or fever as directed by your caregiver.   Do not drink alcohol.   Do not smoke.   Do not drive.   Do not make important legal decisions.   Do not operate heavy machinery.   Do not care for small children by yourself.   Keep your dressing clean and dry. You may replace dressing with a bandage after 24 hours.   You may take a bath or shower after 24 hours.   Use an ice pack for 20 minutes every 2 hours while awake for pain as needed.  SEEK MEDICAL CARE IF:    There is redness, swelling, or increasing pain at the biopsy site.   There is pus coming from the biopsy site.   There is drainage from a biopsy site lasting longer than one day.   An unexplained oral temperature above 102 F (38.9 C) develops.  SEEK IMMEDIATE MEDICAL CARE IF:    You develop a rash.   You have difficulty  breathing.   You develop any reaction or side effects to medications given.  Document Released: 12/01/2004 Document Revised: 08/06/2011 Document Reviewed: 05/11/2008  Exit[employer] Patient Information 2014 [employer].

## 2013-02-11 NOTE — Progress Notes (Signed)
1015-Pt discharged to home s/p BM biopsy to left hip per Dr. Cyndie Chime.  Dressing clean, dry and intact to left hip at time of discharge.  VS stable.  Pt with no questions at this time.  BM biopsy discharge instructions reviewed with patient.  Teach back done.  Pt has no questions at this time.

## 2013-02-12 ENCOUNTER — Other Ambulatory Visit: Payer: Self-pay | Admitting: Oncology

## 2013-02-13 ENCOUNTER — Other Ambulatory Visit (HOSPITAL_BASED_OUTPATIENT_CLINIC_OR_DEPARTMENT_OTHER): Payer: BC Managed Care – PPO | Admitting: Lab

## 2013-02-13 ENCOUNTER — Telehealth: Payer: Self-pay | Admitting: *Deleted

## 2013-02-13 ENCOUNTER — Ambulatory Visit (HOSPITAL_BASED_OUTPATIENT_CLINIC_OR_DEPARTMENT_OTHER): Payer: BC Managed Care – PPO

## 2013-02-13 ENCOUNTER — Other Ambulatory Visit: Payer: Self-pay | Admitting: Nurse Practitioner

## 2013-02-13 ENCOUNTER — Encounter: Payer: Self-pay | Admitting: *Deleted

## 2013-02-13 VITALS — BP 122/78 | HR 59 | Temp 98.5°F | Resp 16

## 2013-02-13 DIAGNOSIS — Z5112 Encounter for antineoplastic immunotherapy: Secondary | ICD-10-CM

## 2013-02-13 DIAGNOSIS — C9000 Multiple myeloma not having achieved remission: Secondary | ICD-10-CM

## 2013-02-13 LAB — CBC WITH DIFFERENTIAL/PLATELET
BASO%: 1.4 % (ref 0.0–2.0)
Basophils Absolute: 0 10*3/uL (ref 0.0–0.1)
EOS%: 1.7 % (ref 0.0–7.0)
Eosinophils Absolute: 0.1 10*3/uL (ref 0.0–0.5)
HCT: 38.9 % (ref 38.4–49.9)
HGB: 13.3 g/dL (ref 13.0–17.1)
LYMPH%: 37.5 % (ref 14.0–49.0)
MCH: 32.5 pg (ref 27.2–33.4)
MCHC: 34.2 g/dL (ref 32.0–36.0)
MCV: 95 fL (ref 79.3–98.0)
MONO#: 0.7 10*3/uL (ref 0.1–0.9)
MONO%: 24.8 % — ABNORMAL HIGH (ref 0.0–14.0)
NEUT#: 1 10*3/uL — ABNORMAL LOW (ref 1.5–6.5)
NEUT%: 34.6 % — ABNORMAL LOW (ref 39.0–75.0)
Platelets: 154 10*3/uL (ref 140–400)
RBC: 4.1 10*6/uL — ABNORMAL LOW (ref 4.20–5.82)
RDW: 15.1 % — ABNORMAL HIGH (ref 11.0–14.6)
WBC: 3 10*3/uL — ABNORMAL LOW (ref 4.0–10.3)
lymph#: 1.1 10*3/uL (ref 0.9–3.3)

## 2013-02-13 MED ORDER — DEXAMETHASONE 4 MG PO TABS
20.0000 mg | ORAL_TABLET | Freq: Two times a day (BID) | ORAL | Status: DC
Start: 2013-02-13 — End: 2013-02-13

## 2013-02-13 MED ORDER — BORTEZOMIB CHEMO SQ INJECTION 3.5 MG (2.5MG/ML)
1.3000 mg/m2 | Freq: Once | INTRAMUSCULAR | Status: AC
  Administered 2013-02-13: 2.5 mg via SUBCUTANEOUS
  Filled 2013-02-13: qty 2.5

## 2013-02-13 MED ORDER — LENALIDOMIDE 5 MG PO CAPS: 25.0000 mg | ORAL_CAPSULE | Freq: Every day | ORAL | Status: DC

## 2013-02-13 MED ORDER — ONDANSETRON HCL 8 MG PO TABS
8.0000 mg | ORAL_TABLET | Freq: Once | ORAL | Status: AC
  Administered 2013-02-13: 8 mg via ORAL

## 2013-02-13 MED ORDER — ONDANSETRON HCL 8 MG PO TABS
ORAL_TABLET | ORAL | Status: AC
  Filled 2013-02-13: qty 1

## 2013-02-13 NOTE — Progress Notes (Signed)
Called today's lab results to Dr. Myna Hidalgo who is on call.  Verbal order received and read back to proceed with today's treatment despite counts.

## 2013-02-13 NOTE — Patient Instructions (Signed)
[facility] Discharge Instructions for Patients Receiving Chemotherapy  Today you received the following chemotherapy agents velcade  To help prevent nausea and vomiting after your treatment, we encourage you to take your nausea medication Other Compazine 10 mg by mouth every six hours as needed for nausea or vomiting.   If you develop nausea and vomiting that is not controlled by your nausea medication, call the clinic.   BELOW ARE SYMPTOMS THAT SHOULD BE REPORTED IMMEDIATELY:  *FEVER GREATER THAN 100.5 F  *CHILLS WITH OR WITHOUT FEVER  NAUSEA AND VOMITING THAT IS NOT CONTROLLED WITH YOUR NAUSEA MEDICATION  *UNUSUAL SHORTNESS OF BREATH  *UNUSUAL BRUISING OR BLEEDING  TENDERNESS IN MOUTH AND THROAT WITH OR WITHOUT PRESENCE OF ULCERS  *URINARY PROBLEMS  *BOWEL PROBLEMS  UNUSUAL RASH Items with * indicate a potential emergency and should be followed up as soon as possible.  Feel free to call the clinic you have any questions or concerns. The clinic phone number is [phone].

## 2013-02-13 NOTE — Progress Notes (Signed)
Discharged at 9:53 with spouse.  Ambulatory in no distress.

## 2013-02-13 NOTE — Telephone Encounter (Signed)
Spoke with patient in infusion room and let him know that antibody level is now in normal range at 1500mg .  He appreciated the good news.

## 2013-02-13 NOTE — Telephone Encounter (Signed)
Message copied by Orbie Hurst on Fri Feb 13, 2013  9:57 AM ------      Message from: Levert Feinstein      Created: Sat Feb 07, 2013  3:08 PM       Call pt: antibody level now in normal range at 1500 mg ------

## 2013-02-16 ENCOUNTER — Ambulatory Visit (INDEPENDENT_AMBULATORY_CARE_PROVIDER_SITE_OTHER): Payer: BC Managed Care – PPO | Admitting: Cardiology

## 2013-02-16 ENCOUNTER — Other Ambulatory Visit: Payer: Self-pay | Admitting: *Deleted

## 2013-02-16 ENCOUNTER — Encounter (HOSPITAL_COMMUNITY): Payer: Self-pay | Admitting: *Deleted

## 2013-02-16 ENCOUNTER — Encounter: Payer: Self-pay | Admitting: Cardiology

## 2013-02-16 VITALS — BP 120/86 | HR 57 | Ht 65.0 in | Wt 174.4 lb

## 2013-02-16 DIAGNOSIS — I2510 Atherosclerotic heart disease of native coronary artery without angina pectoris: Secondary | ICD-10-CM

## 2013-02-16 DIAGNOSIS — C9000 Multiple myeloma not having achieved remission: Secondary | ICD-10-CM

## 2013-02-16 DIAGNOSIS — Z951 Presence of aortocoronary bypass graft: Secondary | ICD-10-CM

## 2013-02-16 DIAGNOSIS — Z8679 Personal history of other diseases of the circulatory system: Secondary | ICD-10-CM

## 2013-02-16 DIAGNOSIS — Z01810 Encounter for preprocedural cardiovascular examination: Secondary | ICD-10-CM

## 2013-02-16 DIAGNOSIS — E785 Hyperlipidemia, unspecified: Secondary | ICD-10-CM

## 2013-02-16 DIAGNOSIS — D472 Monoclonal gammopathy: Secondary | ICD-10-CM

## 2013-02-16 MED ORDER — ACYCLOVIR 400 MG PO TABS: 400.0000 mg | ORAL_TABLET | Freq: Two times a day (BID) | ORAL | Status: DC

## 2013-02-16 NOTE — Patient Instructions (Signed)
Your physician has requested that you have an echocardiogram. Echocardiography is a painless test that uses sound waves to create images of your heart. It provides your doctor with information about the size and shape of your heart and how well your heart's chambers and valves are working. This procedure takes approximately one hour. There are no restrictions for this procedure.  Your physician has requested that you have en exercise stress myoview. For further information please visit [email or web address] Please follow instruction sheet, as given.  Schedule for PFT WITH DLCO3 , PA AND LAT CHEST XRAY,if any test come back abnormal we will see you sooner. Otherwise we let you know about your clearance.  Your physician wants you to follow-up in 6 MONTH Dr Herbie [place].  You will receive a reminder letter in the mail two months in advance. If you don't receive a letter, please call our office to schedule the follow-up appointment.

## 2013-02-17 DIAGNOSIS — Z01810 Encounter for preprocedural cardiovascular examination: Secondary | ICD-10-CM | POA: Insufficient documentation

## 2013-02-17 NOTE — Assessment & Plan Note (Addendum)
1 Maj. B. question ischemic, aspirin Plavix. 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 His last stress test was almost 4 years ago. For risk stratification I think it warrants checking a stress test to ensure that it be safe to stop his aspirin Plavix, and in order to safely proceed with induction chemotherapy.  He is on ACE inhibitor, Vytorin, and DAPT (aspirin and Plavix).  Not on beta blocker which is appropriate based on his fatigue.  Plan: Exercise Myoview

## 2013-02-17 NOTE — Assessment & Plan Note (Signed)
Need for induction chemotherapy. Pre-treatment evaluation as noted above. See orders below.

## 2013-02-17 NOTE — Assessment & Plan Note (Signed)
Pretty much at goal from last test. Continue Vytorin

## 2013-02-17 NOTE — Assessment & Plan Note (Signed)
Actually preprocedural evaluation as noted above.

## 2013-02-17 NOTE — Progress Notes (Signed)
PCP: Carollee Herter, MD  Clinic Note: Chief Complaint  Patient presents with  . pre op clearance    need clearance to go to  [facility] FOR STEM CELL IN NOV.;no chest pain, no sob, no swelling    HPI: [name] is a 52 y.o. male with a PMH below who presents today for pre-chemotherapy evaluation. He is a patient well-known to me who I last saw in January of this year. He has a history of coronary disease status post CABG in 2005 followup nuclear stress test in 2011 was negative for ischemia. Also normal EF by echocardiogram. He does have valvular disease including mild/moderate aortic regurgitation, moderate MR and TR as well as aortic insufficiency. He was diagnosed with multiple myeloma this may (after protein was noted and routine urinalysis) and now managed by Dr. Cyndie Chime.  He has been treated currently with Velcade and Revlimid. He says will he has had consultation by Dr. Barbaraann Boys @ [facility] in this patient for consult patient chemotherapy. He is in need a complete cardiac evaluation with multiple studies. The patient is referred to me for cardiac evaluation. We'll go ahead and get studies ordered include the following: Echocardiogram, PFTs with DLCO, ECG which was done today, and PA and lateral chest x-ray. Also simply because of his history coronary disease with CABG and being 3 years post recent stress test, we'll go and get Myoview stress test as well.  Interval History: [name] comes in today feeling fine for cardiac standpoint. He's fatigued and tired after his chemotherapy. His been going all the way to the taking prednisone. He's not been as active as he usually would be his hip hurts that the biopsies he does not do much walking. The chemotherapy makes him feel tired.  The remainder of Cardiovascular ROS is as follows:: no chest pain or dyspnea on exertion negative for - edema, irregular heartbeat, loss of consciousness, orthopnea, palpitations, paroxysmal nocturnal dyspnea, rapid  heart rate or shortness of breath  Additional cardiac review of systems: Lightheadedness / dizziness - on occasion around his chemotherapy, syncope/near-syncope - no; TIA/amaurosis fugax - no Melena - no, hematochezia no; hematuria - no; nosebleeds - no; claudication - no    Past Medical History  Diagnosis Date  . Coronary artery disease     s/p CABG; followed by Dr. Bryan Lemma, [facility]  . Mitral valve prolapse   . Allergy   . Hyperlipidemia   . Echocardiogram abnormal 07/25/12    LV mild dilation, mild septal hypertrophy, moderate aortic regurg and mitral regurg, severe L atrium dilation, no regional wall abnormality, LV diastolic function normal; Dr. Bryan Lemma  . Aortic insufficiency   . Erectile dysfunction   . H/O cardiovascular stress test 02/22/10    normal perfusion, no ischemia or infarct; treadmill and Myoview perfusion study; Dr. Lynnea Ferrier  . Pneumococcal vaccine refused 08/15/2012  . Monoclonal gammopathy 09/01/2012    Increased total protein, increased beta globulin peak with decreased gammaglobulin peak on SPEP 08/18/12  IFE not done "possible faint abnormal band"  . Bence Jones proteinuria 09/01/2012    Borderline increase protein 190 mg on 24 hr urine IFE with free kappa light chains (lab normal 50-100 mg)  08/18/12  . Normochromic normocytic anemia 09/01/2012    Hb 12.7, MCV 95  08/18/12 was 13.7 1 year ago; concomitant leukopenia  & increased lymphs  . Leukopenia 09/01/2012    WBC 3,700 42 poly, 48 lymphs, 9 monos 08/18/12  Was 3,700  1 year ago  . Multiple myeloma, without mention of having achieved remission(203.00) 10/14/2012    Stage I [place]ie-[place]; good prognosis international scoring system: see 10/13/12 progress note  . Multiple myeloma   . Hypertension     Prior Cardiac Evaluation and Past Surgical History: Past Surgical History  Procedure Laterality Date  . Coronary artery bypass graft  10/2003    LIMA to LAD, RIMA to OM1, sequential  to OM2; Dr. Lynnea Ferrier  . Colonoscopy      ?      Allergies  Allergen Reactions  . Toprol Xl [Metoprolol Tartrate]     Intolerance-  Made him tired    Current Outpatient Prescriptions  Medication Sig Dispense Refill  . albuterol (PROVENTIL HFA;VENTOLIN HFA) 108 (90 BASE) MCG/ACT inhaler Inhale 2 puffs into the lungs every 4 (four) hours as needed for wheezing or shortness of breath (or cough).  1 Inhaler  0  . aspirin 81 MG tablet Take 81 mg by mouth daily.        . Azelastine HCl (ASTEPRO) 0.15 % SOLN Place 1-2 sprays into the nose 2 (two) times daily.  30 mL  2  . bortezomib SQ (VELCADE) 3.5 MG SOLR Inject into the skin once a week.      . clopidogrel (PLAVIX) 75 MG tablet Take 75 mg by mouth daily.        Marland Kitchen dexamethasone (DECADRON) 4 MG tablet Take 10 tablets (40 mg total) by mouth once a week. While on chemo  40 tablet  3  . DiphenhydrAMINE HCl (BENADRYL PO) Take by mouth as needed.      . docusate sodium (COLACE) 100 MG capsule Take 200 mg by mouth 2 (two) times daily.      Marland Kitchen ezetimibe-simvastatin (VYTORIN) 10-20 MG per tablet Take 1 tablet by mouth at bedtime.      . fish oil-omega-3 fatty acids 1000 MG capsule Take 2 g by mouth daily.        . folic acid (FOLVITE) 800 MCG tablet Take 800 mcg by mouth daily.       Marland Kitchen ibuprofen (ADVIL,MOTRIN) 200 MG tablet Take 400 mg by mouth every 6 (six) hours as needed for pain or fever.      Marland Kitchen lenalidomide (REVLIMID) 25 MG capsule Take 1 capsule (25 mg total) by mouth daily. For 21 days then 7 day rest.  21 capsule  0  . lisinopril (PRINIVIL,ZESTRIL) 10 MG tablet Take 10 mg by mouth daily.        . Loratadine (CLARITIN PO) Take by mouth daily.      . mometasone (NASONEX) 50 MCG/ACT nasal spray Place 2 sprays into the nose as needed.      . polyethylene glycol (MIRALAX / GLYCOLAX) packet Take 17 g by mouth daily.      . sildenafil (VIAGRA) 100 MG tablet Take 1 tablet (100 mg total) by mouth as needed for erectile dysfunction.  32 tablet  3  .  vardenafil (LEVITRA) 20 MG tablet Take 1 tablet (20 mg total) by mouth daily as needed for erectile dysfunction.  10 tablet  0  . acyclovir (ZOVIRAX) 400 MG tablet Take 1 tablet (400 mg total) by mouth 2 (two) times daily.  60 tablet  3   No current facility-administered medications for this visit.    History   Social History Narrative  . No narrative on file    ROS: A comprehensive Review of Systems - Fatigue and malaise from his chemotherapy. He did  have fever back in July. he has pain from his bone marrow biopsy sites. He has been running in the neck and so that's probably part of his fatigue. Otherwise his constitutional symptoms of fevers and nausea moaning and resolved. He is otherwise stable with remaining ROS negative.  PHYSICAL EXAM BP 120/86  Pulse 57  Ht 5\' 5"  (1.651 m)  Wt 174 lb 6.4 oz (79.107 kg)  BMI 29.02 kg/m2  General appearance: alert, cooperative, appears stated age, no distress and Well-nourished and well-groomed. Mild truncal obesity. Answers questions appropriate. Otherwise healthy-appearing Neck: no adenopathy, no carotid bruit, no JVD, supple, symmetrical, trachea midline and thyroid not enlarged, symmetric, no tenderness/mass/nodules Lungs: clear to auscultation bilaterally, normal percussion bilaterally and Nonlabored, good air movement Heart: normal apical impulse, regular rate and rhythm, S1: decreased intensity, S2: normal intensity, no S3 or S4, systolic murmur: holosystolic 3/6, low pitch and blowing at apex, 2nd systolic murmur: systolic ejection 2/6, crescendo, decrescendo and harsh at 2nd right intercostal space, radiates to carotids, diastolic murmur: early diastolic 1/6, scratchy at 2nd left intercostal space, at 2nd right intercostal space, ejection click present and no rub Abdomen: soft, non-tender; bowel sounds normal; no masses,  no organomegaly Extremities: extremities normal, atraumatic, no cyanosis or edema, no edema, redness or tenderness in the  calves or thighs and no ulcers, gangrene or trophic changes Pulses: 2+ and symmetric Neurologic: Alert and oriented X 3, normal strength and tone. Normal symmetric reflexes. Normal coordination and gait HEENT: [place]/AT, EOMI, MMM, anicteric sclera  [id number] today: Yes Rate: 57 , Rhythm: Sinus bradycardia, first degree AV block;    Recent Labs:   TC 114, LDL P-780, LDL-C. 54, HDL 47, TG 65. -- Quite well controlled  ASSESSMENT / PLAN: CAD in native artery 0 s/p CABG 1 Maj. B. question ischemic, aspirin Plavix. 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 His last stress test was almost 4 years ago. For risk stratification I think it warrants checking a stress test to ensure that it be safe to stop his aspirin Plavix, and in order to safely proceed with induction chemotherapy.  He is on ACE inhibitor, Vytorin, and DAPT (aspirin and Plavix).  Not on beta blocker which is appropriate based on his fatigue.  Plan: Exercise Myoview  Hx of valvular heart disease - MVP with midl-mod MR, Mild AI with Aortic Sclerosis Simply based on the extensive aortic and mitral valvular lesions, likely shouldn't is no worsening lesions. Also will need a baseline assessment of his EF.  Plan: 2-D echocardiogram. We'll also check PFTs and two-view chest x-ray to confirm no evidence of pulmonary edema or respiratory abnormalities.  Hyperlipidemia LDL goal <70 Pretty much at goal from last test. Continue Vytorin  Multiple myeloma, without mention of having achieved remission(203.00) Need for induction chemotherapy. Pre-treatment evaluation as noted above. See orders below.  Pre-operative cardiovascular examination Actually preprocedural evaluation as noted above.     Orders Placed This Encounter  Procedures  . DG Chest 2 View  . Myocardial Perfusion Imaging    Pre op  clearance  . EKG 12-Lead  . 2D Echocardiogram without contrast  . Pulmonary  function test   Meds ordered this encounter  Medications  . mometasone (NASONEX) 50 MCG/ACT nasal spray    Sig: Place 2 sprays into the nose as needed.   Followup: 6 months if not early because of abnormal tests.  Simra Fiebig W. Herbie Baltimore, M.D., M.S. [facility] [address] [place], [place]  [address]  [phone] Pager # [phone]

## 2013-02-17 NOTE — Assessment & Plan Note (Signed)
Simply based on the extensive aortic and mitral valvular lesions, likely shouldn't is no worsening lesions. Also will need a baseline assessment of his EF.  Plan: 2-D echocardiogram. We'll also check PFTs and two-view chest x-ray to confirm no evidence of pulmonary edema or respiratory abnormalities.

## 2013-02-18 LAB — TISSUE HYBRIDIZATION (BONE MARROW)-NCBH

## 2013-02-18 LAB — CHROMOSOME ANALYSIS, BONE MARROW

## 2013-02-20 ENCOUNTER — Encounter (HOSPITAL_COMMUNITY): Payer: BC Managed Care – PPO

## 2013-02-20 ENCOUNTER — Other Ambulatory Visit: Payer: Self-pay | Admitting: Oncology

## 2013-02-20 ENCOUNTER — Other Ambulatory Visit (HOSPITAL_BASED_OUTPATIENT_CLINIC_OR_DEPARTMENT_OTHER): Payer: BC Managed Care – PPO | Admitting: Lab

## 2013-02-20 ENCOUNTER — Ambulatory Visit (HOSPITAL_BASED_OUTPATIENT_CLINIC_OR_DEPARTMENT_OTHER): Payer: BC Managed Care – PPO

## 2013-02-20 VITALS — BP 128/76 | HR 58 | Temp 97.9°F

## 2013-02-20 DIAGNOSIS — Z5112 Encounter for antineoplastic immunotherapy: Secondary | ICD-10-CM

## 2013-02-20 DIAGNOSIS — C9000 Multiple myeloma not having achieved remission: Secondary | ICD-10-CM

## 2013-02-20 LAB — CBC WITH DIFFERENTIAL/PLATELET
BASO%: 1 % (ref 0.0–2.0)
Basophils Absolute: 0 10*3/uL (ref 0.0–0.1)
EOS%: 3.6 % (ref 0.0–7.0)
Eosinophils Absolute: 0.1 10*3/uL (ref 0.0–0.5)
HCT: 38.9 % (ref 38.4–49.9)
HGB: 13.4 g/dL (ref 13.0–17.1)
LYMPH%: 31.6 % (ref 14.0–49.0)
MCH: 32.7 pg (ref 27.2–33.4)
MCHC: 34.5 g/dL (ref 32.0–36.0)
MCV: 94.7 fL (ref 79.3–98.0)
MONO#: 0.5 10*3/uL (ref 0.1–0.9)
MONO%: 13.1 % (ref 0.0–14.0)
NEUT#: 1.8 10*3/uL (ref 1.5–6.5)
NEUT%: 50.7 % (ref 39.0–75.0)
Platelets: 161 10*3/uL (ref 140–400)
RBC: 4.11 10*6/uL — ABNORMAL LOW (ref 4.20–5.82)
RDW: 15 % — ABNORMAL HIGH (ref 11.0–14.6)
WBC: 3.5 10*3/uL — ABNORMAL LOW (ref 4.0–10.3)
lymph#: 1.1 10*3/uL (ref 0.9–3.3)

## 2013-02-20 MED ORDER — ONDANSETRON HCL 8 MG PO TABS
ORAL_TABLET | ORAL | Status: AC
  Filled 2013-02-20: qty 1

## 2013-02-20 MED ORDER — ONDANSETRON HCL 8 MG PO TABS
8.0000 mg | ORAL_TABLET | Freq: Once | ORAL | Status: AC
  Administered 2013-02-20: 8 mg via ORAL

## 2013-02-20 MED ORDER — BORTEZOMIB CHEMO SQ INJECTION 3.5 MG (2.5MG/ML)
1.3000 mg/m2 | Freq: Once | INTRAMUSCULAR | Status: AC
  Administered 2013-02-20: 2.5 mg via SUBCUTANEOUS
  Filled 2013-02-20: qty 2.5

## 2013-02-25 ENCOUNTER — Telehealth: Payer: Self-pay | Admitting: Oncology

## 2013-02-25 DIAGNOSIS — I341 Nonrheumatic mitral (valve) prolapse: Secondary | ICD-10-CM

## 2013-02-25 DIAGNOSIS — I351 Nonrheumatic aortic (valve) insufficiency: Secondary | ICD-10-CM

## 2013-02-25 DIAGNOSIS — I340 Nonrheumatic mitral (valve) insufficiency: Secondary | ICD-10-CM

## 2013-02-25 HISTORY — DX: Nonrheumatic aortic (valve) insufficiency: I35.1

## 2013-02-25 HISTORY — DX: Nonrheumatic mitral (valve) prolapse: I34.1

## 2013-02-25 HISTORY — DX: Nonrheumatic mitral (valve) insufficiency: I34.0

## 2013-02-25 NOTE — Telephone Encounter (Signed)
Per JG moved 10/3 f/u appt to from 9:30am to 12:30pm due to a meeting. Lab and tx appts moved accordingly. S/w pt wife re change w/new time for 10/3 lb/fu/tx @ 12pm. Pt has test @ [facility] same day @ 1:15pm and wife will try to have this time changed.

## 2013-02-27 ENCOUNTER — Ambulatory Visit (HOSPITAL_BASED_OUTPATIENT_CLINIC_OR_DEPARTMENT_OTHER): Payer: BC Managed Care – PPO | Admitting: Oncology

## 2013-02-27 ENCOUNTER — Ambulatory Visit (HOSPITAL_BASED_OUTPATIENT_CLINIC_OR_DEPARTMENT_OTHER): Payer: BC Managed Care – PPO

## 2013-02-27 ENCOUNTER — Ambulatory Visit (HOSPITAL_COMMUNITY)
Admission: RE | Admit: 2013-02-27 | Discharge: 2013-02-27 | Disposition: A | Discharge status: Home / Self Care | Payer: BC Managed Care – PPO | Source: Ambulatory Visit | Attending: Cardiovascular Disease | Admitting: Cardiovascular Disease

## 2013-02-27 ENCOUNTER — Telehealth: Payer: Self-pay | Admitting: Oncology

## 2013-02-27 ENCOUNTER — Ambulatory Visit: Payer: BC Managed Care – PPO

## 2013-02-27 ENCOUNTER — Other Ambulatory Visit (HOSPITAL_BASED_OUTPATIENT_CLINIC_OR_DEPARTMENT_OTHER): Payer: BC Managed Care – PPO | Admitting: Lab

## 2013-02-27 VITALS — BP 114/68 | HR 76 | Temp 98.0°F | Resp 18 | Ht 65.0 in | Wt 172.9 lb

## 2013-02-27 DIAGNOSIS — I2510 Atherosclerotic heart disease of native coronary artery without angina pectoris: Secondary | ICD-10-CM

## 2013-02-27 DIAGNOSIS — D472 Monoclonal gammopathy: Secondary | ICD-10-CM

## 2013-02-27 DIAGNOSIS — Z01810 Encounter for preprocedural cardiovascular examination: Secondary | ICD-10-CM | POA: Insufficient documentation

## 2013-02-27 DIAGNOSIS — Z5112 Encounter for antineoplastic immunotherapy: Secondary | ICD-10-CM

## 2013-02-27 DIAGNOSIS — C9000 Multiple myeloma not having achieved remission: Secondary | ICD-10-CM

## 2013-02-27 DIAGNOSIS — Z8679 Personal history of other diseases of the circulatory system: Secondary | ICD-10-CM

## 2013-02-27 DIAGNOSIS — Z951 Presence of aortocoronary bypass graft: Secondary | ICD-10-CM | POA: Insufficient documentation

## 2013-02-27 DIAGNOSIS — E785 Hyperlipidemia, unspecified: Secondary | ICD-10-CM

## 2013-02-27 LAB — CBC WITH DIFFERENTIAL/PLATELET
BASO%: 0.7 % (ref 0.0–2.0)
Basophils Absolute: 0 10*3/uL (ref 0.0–0.1)
EOS%: 1.8 % (ref 0.0–7.0)
Eosinophils Absolute: 0.1 10*3/uL (ref 0.0–0.5)
HCT: 38.8 % (ref 38.4–49.9)
HGB: 13.2 g/dL (ref 13.0–17.1)
LYMPH%: 19.3 % (ref 14.0–49.0)
MCH: 32.2 pg (ref 27.2–33.4)
MCHC: 34.1 g/dL (ref 32.0–36.0)
MCV: 94.5 fL (ref 79.3–98.0)
MONO#: 0.7 10*3/uL (ref 0.1–0.9)
MONO%: 16 % — ABNORMAL HIGH (ref 0.0–14.0)
NEUT#: 2.6 10*3/uL (ref 1.5–6.5)
NEUT%: 62.2 % (ref 39.0–75.0)
Platelets: 159 10*3/uL (ref 140–400)
RBC: 4.1 10*6/uL — ABNORMAL LOW (ref 4.20–5.82)
RDW: 15 % — ABNORMAL HIGH (ref 11.0–14.6)
WBC: 4.1 10*3/uL (ref 4.0–10.3)
lymph#: 0.8 10*3/uL — ABNORMAL LOW (ref 0.9–3.3)

## 2013-02-27 LAB — COMPREHENSIVE METABOLIC PANEL (CC13)
ALT: 21 U/L (ref 0–55)
AST: 15 U/L (ref 5–34)
Albumin: 3.6 g/dL (ref 3.5–5.0)
Alkaline Phosphatase: 34 U/L — ABNORMAL LOW (ref 40–150)
BUN: 14.3 mg/dL (ref 7.0–26.0)
CO2: 25 mEq/L (ref 22–29)
Calcium: 8.8 mg/dL (ref 8.4–10.4)
Chloride: 107 mEq/L (ref 98–109)
Creatinine: 0.8 mg/dL (ref 0.7–1.3)
Glucose: 114 mg/dl (ref 70–140)
Potassium: 3.9 mEq/L (ref 3.5–5.1)
Sodium: 140 mEq/L (ref 136–145)
Total Bilirubin: 0.67 mg/dL (ref 0.20–1.20)
Total Protein: 7.3 g/dL (ref 6.4–8.3)

## 2013-02-27 LAB — LACTATE DEHYDROGENASE (CC13): LDH: 164 U/L (ref 125–245)

## 2013-02-27 MED ORDER — TECHNETIUM TC 99M SESTAMIBI GENERIC - CARDIOLITE
10.2000 | Freq: Once | INTRAVENOUS | Status: AC | PRN
  Administered 2013-02-27: 10 via INTRAVENOUS

## 2013-02-27 MED ORDER — ONDANSETRON HCL 8 MG PO TABS
8.0000 mg | ORAL_TABLET | Freq: Once | ORAL | Status: AC
  Administered 2013-02-27: 8 mg via ORAL

## 2013-02-27 MED ORDER — BORTEZOMIB CHEMO SQ INJECTION 3.5 MG (2.5MG/ML)
1.3000 mg/m2 | Freq: Once | INTRAMUSCULAR | Status: AC
  Administered 2013-02-27: 2.5 mg via SUBCUTANEOUS
  Filled 2013-02-27: qty 2.5

## 2013-02-27 MED ORDER — TECHNETIUM TC 99M SESTAMIBI GENERIC - CARDIOLITE
31.2000 | Freq: Once | INTRAVENOUS | Status: AC | PRN
  Administered 2013-02-27: 31.2 via INTRAVENOUS

## 2013-02-27 MED ORDER — ONDANSETRON HCL 8 MG PO TABS
ORAL_TABLET | ORAL | Status: AC
  Filled 2013-02-27: qty 1

## 2013-02-27 NOTE — Procedures (Addendum)
[facility] [address] [place] [address] [place] [address] [phone]  Cardiology Nuclear Med Study  [name] is a 52 y.o. male     MRN : [id number]     DOB: August 23, 1960  Procedure Date: 02/27/2013  Nuclear Med Background Indication for Stress Test:  Surgical Clearance and Graft Patency History:  CAD, CABG 2005 Cardiac Risk Factors: Family History - CAD, Hypertension and Lipids  Symptoms:  Dizziness, Fatigue and Light-Headedness   Nuclear Pre-Procedure Caffeine/Decaff Intake:  7:00pm NPO After: 5:00am   IV Site: R Antecubital  IV 0.9% NS with Angio Cath:  22g  Chest Size (in):  44 IV Started by: Koren Shiver, CNMT  Height: 5\' 5"  (1.651 m)  Cup Size: n/a  BMI:  Body mass index is 28.96 kg/(m^2). Weight:  174 lb (78.926 kg)   Tech Comments:  n/a    Nuclear Med Study 1 or 2 day study: 1 day  Stress Test Type:  Stress  Order Authorizing Provider:  Bryan Lemma, MD   Resting Radionuclide: Technetium 76m Sestamibi  Resting Radionuclide Dose: 10.2 mCi   Stress Radionuclide:  Technetium 17m Sestamibi  Stress Radionuclide Dose: 31.2 mCi           Stress Protocol Rest HR: 61 Stress HR: 142  Rest BP: 132/82 Stress BP: 193/77  Exercise Time (min): 11:46 METS: 13.30   Predicted Max HR: 167 bpm % Max HR: 85.03 bpm Rate Pressure Product: 21308  Dose of Adenosine (mg):  n/a Dose of Lexiscan: n/a mg  Dose of Atropine (mg): n/a Dose of Dobutamine: n/a mcg/kg/min (at max HR)  Stress Test Technologist: Ernestene Mention, CCT Nuclear Technologist: Gonzella Lex, CNMT   Rest Procedure:  Myocardial perfusion imaging was performed at rest 45 minutes following the intravenous administration of Technetium 107m Sestamibi. Stress Procedure:  The patient performed treadmill exercise using a Bruce  Protocol for 11 minutes and 46 seconds. The patient stopped due to shortness of breath and fatigue. Patient denied any chest pain.  There were no  significant ST-T wave changes.  Technetium 56m Sestamibi was injected at peak exercise and myocardial perfusion imaging was performed after a brief delay.  Transient Ischemic Dilatation (Normal <1.22):  0.89 Lung/Heart Ratio (Normal <0.45):  0.46 QGS EDV:  145 ml QGS ESV:  63 ml LV Ejection Fraction: 57%     Rest ECG: NSR - Normal EKG  Stress ECG: No significant change from baseline ECG  QPS Raw Data Images:  Mild diaphragmatic attenuation.  Normal left ventricular size. Stress Images:  Normal homogeneous uptake in all areas of the myocardium. Rest Images:  Normal homogeneous uptake in all areas of the myocardium. Subtraction (SDS):  There is a fixed inferior defect that is most consistent with diaphragmatic attenuation. LV Wall Motion:  NL LV Function; NL Wall Motion  Impression Exercise Capacity:  Lexiscan with no exercise. BP Response:  Normal blood pressure response. Clinical Symptoms:  No significant symptoms noted. ECG Impression:  No significant ECG changes with Lexiscan. Comparison with Prior Nuclear Study: No significant change from previous study   Overall Impression:  Low risk stress nuclear study with diaphragmatic attenuation artifact.Thurmon Fair, MD  02/27/2013 12:48 PM

## 2013-02-27 NOTE — Telephone Encounter (Signed)
lvm for pt with all OCT appt.Marland KitchenMarland Kitchen

## 2013-03-01 NOTE — Progress Notes (Signed)
Hematology and Oncology Follow Up Visit  [name] [id number] 08/02/1960 52 y.o. 03/01/2013 10:19 AM   Principle Diagnosis: Encounter Diagnosis  Name Primary?  . Multiple myeloma, without mention of having achieved remission(203.00) Yes     Interim History:   Short interval followup visit for this 52 year old man under active treatment for early stage, IgG kappa, multiple myeloma. He has been on full dose RVD since diagnosis in May of this year. Treatment started on May 30 with Revlimid 25 mg day 1 through 21, Velcade 1.5 mg per meter squared subcutaneous weekly, and dexamethasone 40 mg by mouth weekly. He has received 18 doses of Velcade so far most recent dose will be given today October 3.  He was recently restaged with a bone marrow on September 17 which only showed a minor reduction in total plasma cells from initial 25% down to 15-20% despite  a significant  reduction in his total antibody levels from 4 g at diagnosis to most recent IgG level of 1.5 g on 02/06/2013. And kappa free light chains down from 27.9 mg percent on April 16 to 5.45 mg percent on September 4. He is currently under evaluation for consolidation chemotherapy with autologous stem cell support at [facility]. He is undergoing a cardiac evaluation in view of prior history of coronary bypass surgery. He just had a stress test this week. Results are outstanding. Echocardiogram done 07/25/2012 prior to initiating treatment showed a mildly dilated left ventricular cavity but normal wall thickness and systolic function. Estimated ejection fraction not reported on that study. He has been asymptomatic from a cardiac standpoint.  Medications: reviewed  Allergies:  Allergies  Allergen Reactions  . Toprol Xl [Metoprolol Tartrate]     Intolerance-  Made him tired    Review of Systems: Constitutional:   No anorexia, weight loss, or fever HEENT: No sore throat Respiratory: No cough or  dyspnea Cardiovascular:  No chest pain, chest pressure, or palpitations Gastrointestinal: No abdominal pain or change in bowel habit Genito-Urinary: No urinary tract symptoms Musculoskeletal: No muscle bone or joint pain Neurologic: No headache or change in vision Skin: No rash or ecchymosis  Remaining ROS negative.   Physical Exam: Blood pressure 114/68, pulse 76, temperature 98 F (36.7 C), temperature source Oral, resp. rate 18, height 5\' 5"  (1.651 m), weight 172 lb 14.4 oz (78.427 kg). Wt Readings from Last 3 Encounters:  02/27/13 172 lb 14.4 oz (78.427 kg)  02/27/13 174 lb (78.926 kg)  02/16/13 174 lb 6.4 oz (79.107 kg)     General appearance: Well-nourished Caucasian man HENNT: Pharynx no erythema, exudate, or ulcer. No thyromegaly Lymph nodes: No cervical, supraclavicular, or axillary adenopathy Breasts: Lungs: Clear to auscultation resonant to percussion Heart: Regular rhythm  Abdomen: Soft, nontender, no mass, no organomegaly Extremities: No edema, no calf tenderness Musculoskeletal: No joint deformities GU: Vascular: No carotid bruits, no cyanosis Neurologic: Mental status intact, PERRLA, motor strength 5 over 5, reflexes 1+ symmetric, sensation intact to vibration over the fingertips Skin: No rash or ecchymosis  Lab Results: CBC W/Diff    Component Value Date/Time   WBC 4.1 02/27/2013 1157   WBC 2.8* 02/11/2013 0915   RBC 4.10* 02/27/2013 1157   RBC 4.14* 02/11/2013 0915   HGB 13.2 02/27/2013 1157   HGB 13.3 02/11/2013 0915   HCT 38.8 02/27/2013 1157   HCT 38.5* 02/11/2013 0915   PLT 159 02/27/2013 1157   PLT 157 02/11/2013 0915   MCV 94.5 02/27/2013 1157  MCV 93.0 02/11/2013 0915   MCH 32.2 02/27/2013 1157   MCH 32.1 02/11/2013 0915   MCHC 34.1 02/27/2013 1157   MCHC 34.5 02/11/2013 0915   RDW 15.0* 02/27/2013 1157   RDW 13.9 02/11/2013 0915   LYMPHSABS 0.8* 02/27/2013 1157   LYMPHSABS 0.4* 12/23/2012 2211   MONOABS 0.7 02/27/2013 1157   MONOABS 0.3 12/23/2012 2211    EOSABS 0.1 02/27/2013 1157   EOSABS 0.0 12/23/2012 2211   BASOSABS 0.0 02/27/2013 1157   BASOSABS 0.0 12/23/2012 2211     Chemistry      Component Value Date/Time   NA 140 02/27/2013 1157   NA 134* 12/23/2012 2211   K 3.9 02/27/2013 1157   K 3.9 12/23/2012 2211   CL 98 12/23/2012 2211   CL 103 10/31/2012 1116   CO2 25 02/27/2013 1157   CO2 26 12/23/2012 2211   BUN 14.3 02/27/2013 1157   BUN 8 12/23/2012 2211   CREATININE 0.8 02/27/2013 1157   CREATININE 0.85 12/23/2012 2211   CREATININE 0.89 10/11/2010 0949      Component Value Date/Time   CALCIUM 8.8 02/27/2013 1157   CALCIUM 8.7 12/23/2012 2211   ALKPHOS 34* 02/27/2013 1157   ALKPHOS 31* 12/23/2012 2211   AST 15 02/27/2013 1157   AST 14 12/23/2012 2211   ALT 21 02/27/2013 1157   ALT 16 12/23/2012 2211   BILITOT 0.67 02/27/2013 1157   BILITOT 1.1 12/23/2012 2211       Impression: #1. IgG kappa multiple myeloma Partial response to RVD chemotherapy with normalization of total serum IgG , significant reduction in kappa free light chains in the serum but minimal decrease in bone marrow plasma cells.  He is due for a reevaluation at [facility] within the next 2 weeks. I will continue his weekly Velcade for now pending that visit and any further recommendations. There appears to be a disconnect between the antibody studies in the bone marrow findings of unclear significance. Overall he has early-stage disease with a questionable single asymptomatic bone lesion, and no other adverse prognostic features. I still think would be reasonable to proceed with a conditioning regimen followed by high-dose consolidation with parenteral [place].  #2. Coronary artery disease with history of bypass surgery Currently asymptomatic. Reevaluation in anticipation of high-dose consolidation chemotherapy is in progress.  #3. Mitral and aortic valve regurgitation-asymptomatic   CC:. Dr. Sharlot Gowda; Dr. Bryan Lemma; Dr. Garth Bigness at [facility], MD 10/5/201410:19 AM

## 2013-03-02 ENCOUNTER — Telehealth: Payer: Self-pay | Admitting: *Deleted

## 2013-03-02 ENCOUNTER — Telehealth: Payer: Self-pay | Admitting: Cardiology

## 2013-03-02 ENCOUNTER — Encounter (HOSPITAL_COMMUNITY): Payer: BC Managed Care – PPO

## 2013-03-02 LAB — KAPPA/LAMBDA LIGHT CHAINS
Kappa free light chain: 5.44 mg/dL — ABNORMAL HIGH (ref 0.33–1.94)
Kappa:Lambda Ratio: 27.2 — ABNORMAL HIGH (ref 0.26–1.65)
Lambda Free Lght Chn: 0.2 mg/dL — ABNORMAL LOW (ref 0.57–2.63)

## 2013-03-02 NOTE — Telephone Encounter (Signed)
Is OK to hold ASA & Plavix - per my recent note.  Echo & Nuc done.   Can get letter signed tomorrow for faxing.  Marykay Lex, MD

## 2013-03-02 NOTE — Telephone Encounter (Signed)
Faxed and routed copy of cardiac nuclear med study to Dr. Barbaraann Boys at [facility]. Patient notified of results via VM.

## 2013-03-02 NOTE — Telephone Encounter (Signed)
Pt's wife called regarding the results of his stress test faxed to [facility] as well as a letter stating that he can go off his Plavix and Aspirin. Fax to Dr. Barbaraann Boys at [phone]

## 2013-03-02 NOTE — Telephone Encounter (Signed)
LMTC:  Needs to hold Plavix & ASA for Procedure or treatment?

## 2013-03-02 NOTE — Telephone Encounter (Signed)
Spoke with [name] (wife).  They were at [facility] yesterday and they want [name] to have an additional week of velcade -specifically on 10/17.  Will let Dr.Granfortuna know to do orders and POF. Also [place] states that Nyko will start at [facility] on 10/28.  At some point they will want him to have 10 days of neupogen and she wonders how he will do that.  Let her know that we are open on Saturday and will be glad to give him the neupogen.  However, for Sunday there are basically 3 options 1. Skip this dose.  2. Return to [facility] for this dose or  3. Have [facility] write a rx for one day of neupogen and check with pharmacies and see how expensive it is and if this is doable then her dtr-in-law could possibly give the injection.   She will discuss with [facility] when they go back on 10/20.

## 2013-03-02 NOTE — Telephone Encounter (Signed)
Scheduled to start Stem Cell transplant 03/24/13.  [facility] will need a written letter to hold Plavix and ASA also copy of nuc and echo.  Will send request to Dr. Herbie Baltimore & Burt Knack, RN.

## 2013-03-02 NOTE — Telephone Encounter (Signed)
Message copied by Wandalee Ferdinand on Mon Mar 02, 2013  2:52 PM ------      Message from: Orbie Hurst      Created: Mon Mar 02, 2013  2:26 PM                   ----- Message -----         From: Levert Feinstein, MD         Sent: 03/01/2013  12:13 PM           To: Orbie Hurst, RN, Sabino Snipes, RN, #            Call pt: cardiac study normal - please fax a copy of report to Dr Barbaraann Boys at [facility] won't allow me to forward ------

## 2013-03-02 NOTE — Telephone Encounter (Signed)
Message sent to [facility]

## 2013-03-04 ENCOUNTER — Other Ambulatory Visit: Payer: Self-pay | Admitting: Oncology

## 2013-03-04 DIAGNOSIS — C9000 Multiple myeloma not having achieved remission: Secondary | ICD-10-CM

## 2013-03-05 ENCOUNTER — Other Ambulatory Visit: Payer: Self-pay | Admitting: *Deleted

## 2013-03-05 ENCOUNTER — Ambulatory Visit (HOSPITAL_COMMUNITY)
Admission: RE | Admit: 2013-03-05 | Discharge: 2013-03-05 | Disposition: A | Discharge status: Home / Self Care | Payer: BC Managed Care – PPO | Source: Ambulatory Visit | Attending: Cardiovascular Disease | Admitting: Cardiovascular Disease

## 2013-03-05 ENCOUNTER — Ambulatory Visit (HOSPITAL_COMMUNITY): Payer: BC Managed Care – PPO

## 2013-03-05 DIAGNOSIS — I2510 Atherosclerotic heart disease of native coronary artery without angina pectoris: Secondary | ICD-10-CM | POA: Insufficient documentation

## 2013-03-05 DIAGNOSIS — Z8679 Personal history of other diseases of the circulatory system: Secondary | ICD-10-CM

## 2013-03-05 DIAGNOSIS — D472 Monoclonal gammopathy: Secondary | ICD-10-CM

## 2013-03-05 DIAGNOSIS — E785 Hyperlipidemia, unspecified: Secondary | ICD-10-CM

## 2013-03-05 DIAGNOSIS — Z951 Presence of aortocoronary bypass graft: Secondary | ICD-10-CM

## 2013-03-05 DIAGNOSIS — Z01810 Encounter for preprocedural cardiovascular examination: Secondary | ICD-10-CM

## 2013-03-05 NOTE — Progress Notes (Signed)
2D Echo Performed 03/05/2013    Leata Dominy, RCS  

## 2013-03-05 NOTE — Telephone Encounter (Signed)
THIS REFILL REQUEST FOR REVLIMID WAS PLACED IN DR.GRANFORTUNA'S ACTIVE WORK BOX. 

## 2013-03-06 ENCOUNTER — Other Ambulatory Visit: Payer: Self-pay | Admitting: *Deleted

## 2013-03-06 ENCOUNTER — Telehealth: Payer: Self-pay | Admitting: Oncology

## 2013-03-06 ENCOUNTER — Encounter (INDEPENDENT_AMBULATORY_CARE_PROVIDER_SITE_OTHER): Payer: Self-pay

## 2013-03-06 ENCOUNTER — Other Ambulatory Visit (HOSPITAL_BASED_OUTPATIENT_CLINIC_OR_DEPARTMENT_OTHER): Payer: BC Managed Care – PPO | Admitting: Lab

## 2013-03-06 ENCOUNTER — Ambulatory Visit (HOSPITAL_COMMUNITY)
Admission: RE | Admit: 2013-03-06 | Discharge: 2013-03-06 | Disposition: A | Discharge status: Home / Self Care | Payer: BC Managed Care – PPO | Source: Ambulatory Visit | Attending: Nurse Practitioner | Admitting: Nurse Practitioner

## 2013-03-06 ENCOUNTER — Ambulatory Visit (HOSPITAL_BASED_OUTPATIENT_CLINIC_OR_DEPARTMENT_OTHER): Payer: BC Managed Care – PPO

## 2013-03-06 VITALS — BP 110/69 | HR 64 | Temp 98.3°F

## 2013-03-06 DIAGNOSIS — M948X9 Other specified disorders of cartilage, unspecified sites: Secondary | ICD-10-CM | POA: Insufficient documentation

## 2013-03-06 DIAGNOSIS — M5137 Other intervertebral disc degeneration, lumbosacral region: Secondary | ICD-10-CM | POA: Insufficient documentation

## 2013-03-06 DIAGNOSIS — M51379 Other intervertebral disc degeneration, lumbosacral region without mention of lumbar back pain or lower extremity pain: Secondary | ICD-10-CM | POA: Insufficient documentation

## 2013-03-06 DIAGNOSIS — Z5112 Encounter for antineoplastic immunotherapy: Secondary | ICD-10-CM

## 2013-03-06 DIAGNOSIS — C9000 Multiple myeloma not having achieved remission: Secondary | ICD-10-CM

## 2013-03-06 DIAGNOSIS — Z951 Presence of aortocoronary bypass graft: Secondary | ICD-10-CM | POA: Insufficient documentation

## 2013-03-06 LAB — CBC WITH DIFFERENTIAL/PLATELET
BASO%: 0.6 % (ref 0.0–2.0)
Basophils Absolute: 0 10*3/uL (ref 0.0–0.1)
EOS%: 5.4 % (ref 0.0–7.0)
Eosinophils Absolute: 0.2 10*3/uL (ref 0.0–0.5)
HCT: 40.4 % (ref 38.4–49.9)
HGB: 13.7 g/dL (ref 13.0–17.1)
LYMPH%: 26.7 % (ref 14.0–49.0)
MCH: 32.3 pg (ref 27.2–33.4)
MCHC: 33.9 g/dL (ref 32.0–36.0)
MCV: 95.2 fL (ref 79.3–98.0)
MONO#: 0.7 10*3/uL (ref 0.1–0.9)
MONO%: 22.9 % — ABNORMAL HIGH (ref 0.0–14.0)
NEUT#: 1.3 10*3/uL — ABNORMAL LOW (ref 1.5–6.5)
NEUT%: 44.4 % (ref 39.0–75.0)
Platelets: 129 10*3/uL — ABNORMAL LOW (ref 140–400)
RBC: 4.24 10*6/uL (ref 4.20–5.82)
RDW: 15.3 % — ABNORMAL HIGH (ref 11.0–14.6)
WBC: 3 10*3/uL — ABNORMAL LOW (ref 4.0–10.3)
lymph#: 0.8 10*3/uL — ABNORMAL LOW (ref 0.9–3.3)

## 2013-03-06 MED ORDER — ONDANSETRON HCL 8 MG PO TABS
8.0000 mg | ORAL_TABLET | Freq: Once | ORAL | Status: AC
  Administered 2013-03-06: 8 mg via ORAL

## 2013-03-06 MED ORDER — ONDANSETRON HCL 8 MG PO TABS
ORAL_TABLET | ORAL | Status: AC
  Filled 2013-03-06: qty 1

## 2013-03-06 MED ORDER — BORTEZOMIB CHEMO SQ INJECTION 3.5 MG (2.5MG/ML)
1.3000 mg/m2 | Freq: Once | INTRAMUSCULAR | Status: AC
  Administered 2013-03-06: 2.5 mg via SUBCUTANEOUS
  Filled 2013-03-06: qty 2.5

## 2013-03-06 NOTE — Patient Instructions (Signed)
[facility] Discharge Instructions for Patients Receiving Chemotherapy  Today you received the following chemotherapy agents: Velcade.  To help prevent nausea and vomiting after your treatment, we encourage you to take your nausea medication as prescribed.   If you develop nausea and vomiting that is not controlled by your nausea medication, call the clinic.   BELOW ARE SYMPTOMS THAT SHOULD BE REPORTED IMMEDIATELY:  *FEVER GREATER THAN 100.5 F  *CHILLS WITH OR WITHOUT FEVER  NAUSEA AND VOMITING THAT IS NOT CONTROLLED WITH YOUR NAUSEA MEDICATION  *UNUSUAL SHORTNESS OF BREATH  *UNUSUAL BRUISING OR BLEEDING  TENDERNESS IN MOUTH AND THROAT WITH OR WITHOUT PRESENCE OF ULCERS  *URINARY PROBLEMS  *BOWEL PROBLEMS  UNUSUAL RASH Items with * indicate a potential emergency and should be followed up as soon as possible.  Feel free to call the clinic you have any questions or concerns. The clinic phone number is [phone].    

## 2013-03-06 NOTE — Progress Notes (Signed)
Ok to treat with ANC 1.3 per Lisa Thomas, NP.  

## 2013-03-06 NOTE — Telephone Encounter (Signed)
Sent pt to Radiology today for Bone Survey

## 2013-03-09 ENCOUNTER — Telehealth: Payer: Self-pay | Admitting: *Deleted

## 2013-03-09 NOTE — Telephone Encounter (Signed)
Faxed metastatic Bone Survey and Echo results to [phone] per faxed request from [place], RN--[facility].

## 2013-03-10 ENCOUNTER — Telehealth: Payer: Self-pay | Admitting: *Deleted

## 2013-03-10 NOTE — Telephone Encounter (Signed)
Message copied by Gala Romney on Tue Mar 10, 2013 10:42 AM ------      Message from: Levert Feinstein      Created: Mon Mar 09, 2013  6:09 PM       Call pt: single spot in bone left arm unchanged from before & nothing new ------

## 2013-03-10 NOTE — Telephone Encounter (Signed)
Per Dr. Cyndie Chime; notified pt's wife (ROI on file) -single spot in bone left arm unchanged from before & nothing new.  Pt's wife verbalized understanding and states she would tell pt.  Confirmed appt for 03/13/13.

## 2013-03-11 ENCOUNTER — Ambulatory Visit (INDEPENDENT_AMBULATORY_CARE_PROVIDER_SITE_OTHER): Payer: BC Managed Care – PPO | Admitting: Family Medicine

## 2013-03-11 VITALS — BP 120/80 | HR 60 | Temp 98.1°F | Wt 178.0 lb

## 2013-03-11 DIAGNOSIS — H9209 Otalgia, unspecified ear: Secondary | ICD-10-CM

## 2013-03-11 DIAGNOSIS — J309 Allergic rhinitis, unspecified: Secondary | ICD-10-CM

## 2013-03-11 DIAGNOSIS — H9202 Otalgia, left ear: Secondary | ICD-10-CM

## 2013-03-11 NOTE — Progress Notes (Signed)
  Subjective:    Patient ID: [name], male    DOB: 03/14/1961, 52 y.o.   MRN: [id number]  HPI He has a five-day history of postnasal drainage and a one-day history of left ear pain. No sore throat, fever, chills, cough or congestion. He has not been using his allergy medicines on a regular basis. He is scheduled in the near future for bone marrow transplant for treatment of multiple myeloma.   Review of Systems     Objective:   Physical Exam Alert and in no distress. Left tympanic membranes appear normal the wax was present deep in the canal. Right tympanic membrane is normal . Neck is supple without adenopathy. Throat is clear.       Assessment & Plan:  Otalgia, left  Allergic rhinitis, mild  I do not think that an antibiotic is warranted. Recommend he get back on his allergy medications on regular basis. He is to call if further difficulty.

## 2013-03-11 NOTE — Patient Instructions (Signed)
Use either Flonase or Nasacort regularly and continue on aspirin

## 2013-03-13 ENCOUNTER — Other Ambulatory Visit (HOSPITAL_BASED_OUTPATIENT_CLINIC_OR_DEPARTMENT_OTHER): Payer: BC Managed Care – PPO | Admitting: Lab

## 2013-03-13 ENCOUNTER — Ambulatory Visit (HOSPITAL_BASED_OUTPATIENT_CLINIC_OR_DEPARTMENT_OTHER): Payer: BC Managed Care – PPO

## 2013-03-13 ENCOUNTER — Encounter: Payer: Self-pay | Admitting: Family Medicine

## 2013-03-13 DIAGNOSIS — C9000 Multiple myeloma not having achieved remission: Secondary | ICD-10-CM

## 2013-03-13 DIAGNOSIS — Z5112 Encounter for antineoplastic immunotherapy: Secondary | ICD-10-CM

## 2013-03-13 LAB — CBC WITH DIFFERENTIAL/PLATELET
BASO%: 0.5 % (ref 0.0–2.0)
Basophils Absolute: 0 10*3/uL (ref 0.0–0.1)
EOS%: 0.1 % (ref 0.0–7.0)
Eosinophils Absolute: 0 10*3/uL (ref 0.0–0.5)
HCT: 38.7 % (ref 38.4–49.9)
HGB: 13.2 g/dL (ref 13.0–17.1)
LYMPH%: 16.4 % (ref 14.0–49.0)
MCH: 32.4 pg (ref 27.2–33.4)
MCHC: 34.2 g/dL (ref 32.0–36.0)
MCV: 94.8 fL (ref 79.3–98.0)
MONO#: 0.1 10*3/uL (ref 0.1–0.9)
MONO%: 5.2 % (ref 0.0–14.0)
NEUT#: 2.2 10*3/uL (ref 1.5–6.5)
NEUT%: 77.8 % — ABNORMAL HIGH (ref 39.0–75.0)
Platelets: 168 10*3/uL (ref 140–400)
RBC: 4.08 10*6/uL — ABNORMAL LOW (ref 4.20–5.82)
RDW: 15.3 % — ABNORMAL HIGH (ref 11.0–14.6)
WBC: 2.8 10*3/uL — ABNORMAL LOW (ref 4.0–10.3)
lymph#: 0.5 10*3/uL — ABNORMAL LOW (ref 0.9–3.3)

## 2013-03-13 MED ORDER — BORTEZOMIB CHEMO SQ INJECTION 3.5 MG (2.5MG/ML)
1.3000 mg/m2 | Freq: Once | INTRAMUSCULAR | Status: AC
Start: 2013-03-13 — End: 2013-03-13
  Administered 2013-03-13: 2.5 mg via SUBCUTANEOUS
  Filled 2013-03-13: qty 2.5

## 2013-03-13 MED ORDER — ONDANSETRON HCL 8 MG PO TABS
8.0000 mg | ORAL_TABLET | Freq: Once | ORAL | Status: AC
  Administered 2013-03-13: 8 mg via ORAL

## 2013-03-13 MED ORDER — ONDANSETRON HCL 8 MG PO TABS
ORAL_TABLET | ORAL | Status: AC
  Filled 2013-03-13: qty 1

## 2013-03-13 NOTE — Patient Instructions (Signed)
[facility] Discharge Instructions for Patients Receiving Chemotherapy  Today you received the following chemotherapy agents Velcade.  To help prevent nausea and vomiting after your treatment, we encourage you to take your nausea medication if needed.   If you develop nausea and vomiting that is not controlled by your nausea medication, call the clinic.   BELOW ARE SYMPTOMS THAT SHOULD BE REPORTED IMMEDIATELY:  *FEVER GREATER THAN 100.5 F  *CHILLS WITH OR WITHOUT FEVER  NAUSEA AND VOMITING THAT IS NOT CONTROLLED WITH YOUR NAUSEA MEDICATION  *UNUSUAL SHORTNESS OF BREATH  *UNUSUAL BRUISING OR BLEEDING  TENDERNESS IN MOUTH AND THROAT WITH OR WITHOUT PRESENCE OF ULCERS  *URINARY PROBLEMS  *BOWEL PROBLEMS  UNUSUAL RASH Items with * indicate a potential emergency and should be followed up as soon as possible.  Feel free to call the clinic you have any questions or concerns. The clinic phone number is [phone].    

## 2013-03-16 ENCOUNTER — Other Ambulatory Visit: Payer: Self-pay | Admitting: Certified Registered Nurse Anesthetist

## 2013-03-18 ENCOUNTER — Telehealth: Payer: Self-pay | Admitting: Cardiology

## 2013-03-18 NOTE — Telephone Encounter (Signed)
Need cardiac clarence in writing-scheduled for bone morrow transplant-Also is it all right for him to stop his Plavix? Please fax to [phone]-At: [facility]

## 2013-03-18 NOTE — Telephone Encounter (Signed)
OK - can figure that out after clinic tomorrow.  Echo was stable, Nuc - non-ischemic. Can stop Plavix.  Marykay Lex, MD

## 2013-03-18 NOTE — Telephone Encounter (Signed)
Message sent to Dr. Lavera Guise, RN

## 2013-03-20 ENCOUNTER — Encounter: Payer: Self-pay | Admitting: *Deleted

## 2013-03-20 ENCOUNTER — Telehealth: Payer: Self-pay | Admitting: *Deleted

## 2013-03-20 NOTE — Telephone Encounter (Signed)
FAXED INFORMATION TO [facility]--15 pages sent.   last  office visit ,echo,myoview, hold for 5 days aspirin and plavix,  Notified wife that information.

## 2013-03-21 ENCOUNTER — Encounter: Payer: Self-pay | Admitting: Oncology

## 2013-03-23 ENCOUNTER — Ambulatory Visit (HOSPITAL_BASED_OUTPATIENT_CLINIC_OR_DEPARTMENT_OTHER): Payer: BC Managed Care – PPO | Admitting: Nurse Practitioner

## 2013-03-23 ENCOUNTER — Telehealth: Payer: Self-pay | Admitting: Oncology

## 2013-03-23 VITALS — BP 124/74 | HR 66 | Temp 98.8°F | Resp 18 | Wt 175.1 lb

## 2013-03-23 DIAGNOSIS — I059 Rheumatic mitral valve disease, unspecified: Secondary | ICD-10-CM

## 2013-03-23 DIAGNOSIS — C9000 Multiple myeloma not having achieved remission: Secondary | ICD-10-CM

## 2013-03-23 DIAGNOSIS — I359 Nonrheumatic aortic valve disorder, unspecified: Secondary | ICD-10-CM

## 2013-03-23 NOTE — Telephone Encounter (Signed)
gv and printed appt sched and avs for pt for Dec °

## 2013-03-23 NOTE — Progress Notes (Signed)
OFFICE PROGRESS NOTE  Interval history:  [name] is a 52 year old man diagnosed with early-stage IgG kappa multiple myeloma in May of this year. He has been on full dose RVD since diagnosis. Treatment was initiated on 10/24/2012 with Revlimid 25 mg days 1 through 21, Velcade 1.5 mg per meter squared subcutaneous weekly and dexamethasone 40 mg weekly. Restaging bone marrow 02/11/2013 showed a minor reduction in the total plasma cells from initial 25% down to 15-20% despite a significant reduction in the total antibody levels from 4 g at diagnosis to the most recent IgG level of 1.5 g on 02/06/2013 and kappa free light chains down from 27.9 mg on 09/10/2012 to 5.45 mg on 01/29/2013. Most recent serum light chain analysis showed kappa free light chains at 5.44 mg. Last Velcade was given on 03/13/2013.  He is in the midst of preparation for high-dose chemotherapy/autologous stem cell support.  He is seen today for scheduled followup. He reports having preoperative labs and an EKG scheduled at [facility] 03/25/2013 with plans for central line placement on 03/26/2013 and chemotherapy beginning 03/27/2013 to be followed by Neupogen. His wife plans to administer the Neupogen.  He feels well. Only complaint is feeling "tired". He has a good appetite. He denies pain. No fevers, chills. No shortness of breath or cough. No nausea or vomiting. No mouth sores. He is intermittently constipated and takes MiraLAX as needed. No numbness or tingling in his hands or feet. No skin rash.   Objective: There were no vitals taken for this visit.  Oropharynx is without thrush or ulceration. No palpable cervical or supraclavicular lymph nodes. Lungs are clear. No wheezes or rales. Regular cardiac rhythm. 2-3/6 systolic murmur. Abdomen soft and nontender. No organomegaly. Extremities without edema. Calves nontender. Motor strength 5 over 5. Knee DTRs 1+, symmetric. Vibratory sense intact over the fingertips per tuning fork  exam.  Lab Results: Lab Results  Component Value Date   WBC 2.8* 03/13/2013   HGB 13.2 03/13/2013   HCT 38.7 03/13/2013   MCV 94.8 03/13/2013   PLT 168 03/13/2013    Chemistry:    Chemistry      Component Value Date/Time   NA 140 02/27/2013 1157   NA 134* 12/23/2012 2211   K 3.9 02/27/2013 1157   K 3.9 12/23/2012 2211   CL 98 12/23/2012 2211   CL 103 10/31/2012 1116   CO2 25 02/27/2013 1157   CO2 26 12/23/2012 2211   BUN 14.3 02/27/2013 1157   BUN 8 12/23/2012 2211   CREATININE 0.8 02/27/2013 1157   CREATININE 0.85 12/23/2012 2211   CREATININE 0.89 10/11/2010 0949      Component Value Date/Time   CALCIUM 8.8 02/27/2013 1157   CALCIUM 8.7 12/23/2012 2211   ALKPHOS 34* 02/27/2013 1157   ALKPHOS 31* 12/23/2012 2211   AST 15 02/27/2013 1157   AST 14 12/23/2012 2211   ALT 21 02/27/2013 1157   ALT 16 12/23/2012 2211   BILITOT 0.67 02/27/2013 1157   BILITOT 1.1 12/23/2012 2211       Studies/Results: Dg Bone Survey Met  03/06/2013   *RADIOLOGY REPORT*  Clinical Data: History of multiple myeloma, initial encounter.  METASTATIC BONE SURVEY  Comparison: 10/01/2012  Findings:  The approximately 1.4 x 1.0 cm lytic lesion involving the proximal diaphysis of the left humerus is unchanged.  No new aggressive lytic or sclerotic lesions.  Prominent vascular channels are again noted within the bilateral frontal calvarium.  Normal cardiac silhouette and mediastinal contours.  Post  mediasternotomy CABG.  No focal airspace opacity.  No pleural effusion or pneumothorax.  Surgical clips overlying the right medial femoral condyle.  There are five non-rib bearing lumbar type vertebral bodies with diminutive ribs again seen bilateral at T12.  Normal alignment of the cervical, thoracic and lumbar spine.  Cervical, thoracic and lumbar vertebral body heights are preserved.  There is mild multilevel lumbar spine DDD, worse at L4 - L5 with disc space height loss, end plate irregularity and sclerosis.  Mild degenerative  change of the bilateral hips, right greater than left.  IMPRESSION: 1.  Unchanged approximately 1.4 cm lytic lesion within the proximal diaphysis of the left humerus. 2.  No new aggressive sclerotic or lytic lesions.   Original Report Authenticated By: Tacey Ruiz, MD    Medications: I have reviewed the patient's current medications.  Assessment/Plan:  1. IgG kappa multiple myeloma diagnosed May 2014 treated with RVD with a partial response. 2. Coronary artery disease with history of bypass surgery. 3. Mitral and aortic valve regurgitation.  Disposition-Mr. Boschee appears stable. He is being followed closely at [facility] in anticipation of undergoing high-dose chemotherapy/autologous stem cell support. His wife will forward his treatment calendar to [place].  We scheduled a return visit on 05/15/2013 but will be happy to see him sooner should the need arise. He knows he can contact our office anytime with problems.  Plan reviewed with Dr. Cyndie Chime.  Lonna Cobb ANP/GNP-BC

## 2013-03-26 ENCOUNTER — Other Ambulatory Visit: Payer: Self-pay | Admitting: Oncology

## 2013-03-26 DIAGNOSIS — C9000 Multiple myeloma not having achieved remission: Secondary | ICD-10-CM

## 2013-03-27 ENCOUNTER — Telehealth: Payer: Self-pay | Admitting: Oncology

## 2013-03-27 NOTE — Telephone Encounter (Signed)
Called pt and left message regarding injection on 11/4

## 2013-03-30 ENCOUNTER — Telehealth: Payer: Self-pay | Admitting: *Deleted

## 2013-03-30 ENCOUNTER — Other Ambulatory Visit: Payer: Self-pay | Admitting: Oncology

## 2013-03-30 DIAGNOSIS — C9000 Multiple myeloma not having achieved remission: Secondary | ICD-10-CM

## 2013-03-30 NOTE — Telephone Encounter (Signed)
Received call from [facility] clarifying orders for neupogen to start tomorrow & cont through 04/06/13 b/c his copay for the neupogen would be $2800.  He will be back at [facility] 04/07/13.  Note to Dr Cyndie Chime & will schedule neupogen for other days.

## 2013-03-31 ENCOUNTER — Telehealth: Payer: Self-pay | Admitting: Oncology

## 2013-03-31 ENCOUNTER — Ambulatory Visit (HOSPITAL_BASED_OUTPATIENT_CLINIC_OR_DEPARTMENT_OTHER): Payer: BC Managed Care – PPO

## 2013-03-31 VITALS — BP 135/74 | HR 72 | Temp 98.0°F

## 2013-03-31 DIAGNOSIS — C9000 Multiple myeloma not having achieved remission: Secondary | ICD-10-CM

## 2013-03-31 DIAGNOSIS — Z5189 Encounter for other specified aftercare: Secondary | ICD-10-CM

## 2013-03-31 MED ORDER — FILGRASTIM 480 MCG/0.8ML IJ SOLN
960.0000 ug | Freq: Once | INTRAMUSCULAR | Status: AC
  Administered 2013-03-31: 960 ug via SUBCUTANEOUS
  Filled 2013-03-31: qty 1.6

## 2013-03-31 NOTE — Telephone Encounter (Signed)
Gave pt appt for injections and labs for November 2014

## 2013-04-01 ENCOUNTER — Ambulatory Visit (HOSPITAL_BASED_OUTPATIENT_CLINIC_OR_DEPARTMENT_OTHER): Payer: BC Managed Care – PPO

## 2013-04-01 ENCOUNTER — Other Ambulatory Visit (HOSPITAL_BASED_OUTPATIENT_CLINIC_OR_DEPARTMENT_OTHER): Payer: BC Managed Care – PPO | Admitting: Lab

## 2013-04-01 VITALS — BP 123/73 | HR 96 | Temp 98.6°F

## 2013-04-01 DIAGNOSIS — C9000 Multiple myeloma not having achieved remission: Secondary | ICD-10-CM

## 2013-04-01 DIAGNOSIS — Z5189 Encounter for other specified aftercare: Secondary | ICD-10-CM

## 2013-04-01 LAB — COMPREHENSIVE METABOLIC PANEL (CC13)
ALT: 22 U/L (ref 0–55)
AST: 13 U/L (ref 5–34)
Albumin: 3.8 g/dL (ref 3.5–5.0)
Alkaline Phosphatase: 41 U/L (ref 40–150)
Anion Gap: 12 mEq/L — ABNORMAL HIGH (ref 3–11)
BUN: 13.9 mg/dL (ref 7.0–26.0)
CO2: 25 mEq/L (ref 22–29)
Calcium: 10 mg/dL (ref 8.4–10.4)
Chloride: 102 mEq/L (ref 98–109)
Creatinine: 0.8 mg/dL (ref 0.7–1.3)
Glucose: 113 mg/dl (ref 70–140)
Potassium: 4.1 mEq/L (ref 3.5–5.1)
Sodium: 139 mEq/L (ref 136–145)
Total Bilirubin: 1.5 mg/dL — ABNORMAL HIGH (ref 0.20–1.20)
Total Protein: 8.4 g/dL — ABNORMAL HIGH (ref 6.4–8.3)

## 2013-04-01 LAB — CBC WITH DIFFERENTIAL/PLATELET
BASO%: 0.2 % (ref 0.0–2.0)
Basophils Absolute: 0 10*3/uL (ref 0.0–0.1)
EOS%: 0.9 % (ref 0.0–7.0)
Eosinophils Absolute: 0 10*3/uL (ref 0.0–0.5)
HCT: 39.9 % (ref 38.4–49.9)
HGB: 13.7 g/dL (ref 13.0–17.1)
LYMPH%: 12 % — ABNORMAL LOW (ref 14.0–49.0)
MCH: 32.5 pg (ref 27.2–33.4)
MCHC: 34.4 g/dL (ref 32.0–36.0)
MCV: 94.7 fL (ref 79.3–98.0)
MONO#: 0.1 10*3/uL (ref 0.1–0.9)
MONO%: 3.5 % (ref 0.0–14.0)
NEUT#: 2.2 10*3/uL (ref 1.5–6.5)
NEUT%: 83.4 % — ABNORMAL HIGH (ref 39.0–75.0)
Platelets: 132 10*3/uL — ABNORMAL LOW (ref 140–400)
RBC: 4.21 10*6/uL (ref 4.20–5.82)
RDW: 14.9 % — ABNORMAL HIGH (ref 11.0–14.6)
WBC: 2.6 10*3/uL — ABNORMAL LOW (ref 4.0–10.3)
lymph#: 0.3 10*3/uL — ABNORMAL LOW (ref 0.9–3.3)

## 2013-04-01 LAB — MAGNESIUM (CC13): Magnesium: 2.1 mg/dl (ref 1.5–2.5)

## 2013-04-01 MED ORDER — FILGRASTIM 480 MCG/0.8ML IJ SOLN
960.0000 ug | Freq: Once | INTRAMUSCULAR | Status: AC
  Administered 2013-04-01: 960 ug via SUBCUTANEOUS
  Filled 2013-04-01: qty 1.6

## 2013-04-02 ENCOUNTER — Other Ambulatory Visit: Payer: Self-pay

## 2013-04-02 ENCOUNTER — Telehealth: Payer: Self-pay | Admitting: Oncology

## 2013-04-02 ENCOUNTER — Ambulatory Visit (HOSPITAL_BASED_OUTPATIENT_CLINIC_OR_DEPARTMENT_OTHER): Payer: BC Managed Care – PPO

## 2013-04-02 VITALS — BP 115/68 | HR 79 | Temp 98.3°F | Resp 18

## 2013-04-02 DIAGNOSIS — Z5189 Encounter for other specified aftercare: Secondary | ICD-10-CM

## 2013-04-02 DIAGNOSIS — C9000 Multiple myeloma not having achieved remission: Secondary | ICD-10-CM

## 2013-04-02 MED ORDER — FILGRASTIM 480 MCG/0.8ML IJ SOLN
960.0000 ug | Freq: Once | INTRAMUSCULAR | Status: AC
  Administered 2013-04-02: 960 ug via SUBCUTANEOUS
  Filled 2013-04-02: qty 1.6

## 2013-04-02 MED ORDER — SODIUM CHLORIDE 0.9 % IJ SOLN
10.0000 mL | Freq: Once | INTRAMUSCULAR | Status: AC
  Administered 2013-04-02: 10 mL
  Filled 2013-04-02: qty 10

## 2013-04-02 MED ORDER — HEPARIN SOD (PORK) LOCK FLUSH 100 UNIT/ML IV SOLN
250.0000 [IU] | Freq: Once | INTRAVENOUS | Status: AC
  Administered 2013-04-02: 250 [IU] via INTRAVENOUS
  Filled 2013-04-02: qty 5

## 2013-04-02 NOTE — Patient Instructions (Addendum)
neFilgrastim, G-CSF injection What is this medicine? FILGRASTIM, G-CSF (fil GRA stim) stimulates the formation of white blood cells. This medicine is given to patients with conditions that may cause a decrease in white blood cells, like those receiving certain types of chemotherapy or bone marrow transplant. It helps the bone marrow recover its ability to produce white blood cells. Increasing the amount of white blood cells helps to decrease the risk of infection and fever. This medicine may be used for other purposes; ask your health care provider or pharmacist if you have questions. COMMON BRAND NAME(S): Neupogen What should I tell my health care provider before I take this medicine? They need to know if you have any of these conditions: -currently receiving radiation therapy -sickle cell disease -an unusual or allergic reaction to filgrastim, E. coli protein, other medicines, foods, dyes, or preservatives -pregnant or trying to get pregnant -breast-feeding How should I use this medicine? This medicine is for injection into a vein or injection under the skin. It is usually given by a health care professional in a hospital or clinic setting. If you get this medicine at home, you will be taught how to prepare and give this medicine. Always change the site for the injection under the skin. Let the solution warm to room temperature before you use it. Do not shake the solution before you withdraw a dose. Throw away any unused portion. Use exactly as directed. Take your medicine at regular intervals. Do not take your medicine more often than directed. It is important that you put your used needles and syringes in a special sharps container. Do not put them in a trash can. If you do not have a sharps container, call your pharmacist or healthcare provider to get one. Talk to your pediatrician regarding the use of this medicine in children. While this medicine may be prescribed for children for selected  conditions, precautions do apply. Overdosage: If you think you have taken too much of this medicine contact a poison control center or emergency room at once. NOTE: This medicine is only for you. Do not share this medicine with others. What if I miss a dose? Try not to miss doses. If you miss a dose take the dose as soon as you remember. If it is almost time for the next dose, do not take double doses unless told to by your doctor or health care professional. What may interact with this medicine? -lithium -medicines for cancer chemotherapy This list may not describe all possible interactions. Give your health care provider a list of all the medicines, herbs, non-prescription drugs, or dietary supplements you use. Also tell them if you smoke, drink alcohol, or use illegal drugs. Some items may interact with your medicine. What should I watch for while using this medicine? Visit your doctor or health care professional for regular checks on your progress. If you get a fever or any sign of infection while you are using this medicine, do not treat yourself. Check with your doctor or health care professional. Bone pain can usually be relieved by mild pain relievers such as acetaminophen or ibuprofen. Check with your doctor or health care professional before taking these medicines as they may hide a fever. Call your doctor or health care professional if the aches and pains are severe or do not go away. What side effects may I notice from receiving this medicine? Side effects that you should report to your doctor or health care professional as soon as possible: -allergic reactions  like skin rash, itching or hives, swelling of the face, lips, or tongue -difficulty breathing, wheezing -fever -pain, redness, or swelling at the injection site -stomach or side pain, or pain at the shoulder Side effects that usually do not require medical attention (report to your doctor or health care professional if they  continue or are bothersome): -bone pain (ribs, lower back, breast bone) -headache -skin rash This list may not describe all possible side effects. Call your doctor for medical advice about side effects. You may report side effects to FDA at 1-800-FDA-1088. Where should I keep my medicine? Keep out of the reach of children. Store in a refrigerator between 2 and 8 degrees C (36 and 46 degrees F). Do not freeze or leave in direct sunlight. If vials or syringes are left out of the refrigerator for more than 24 hours, they must be thrown away. Throw away unused vials after the expiration date on the carton. NOTE: This sheet is a summary. It may not cover all possible information. If you have questions about this medicine, talk to your doctor, pharmacist, or health care provider.  2014, Elsevier/Gold Standard. (2007-07-30 13:33:21) Tunneled Catheter Insertion, Care After Refer to this sheet in the next few weeks. These instructions provide you with information on caring for yourself after your procedure. Your caregiver may also give you more specific instructions. Your treatment has been planned according to current medical practices, but problems sometimes occur. Call your caregiver if you have any problems or questions after your procedure.  HOME CARE INSTRUCTIONS  Rest at home the day of the procedure. You will likely be able to return to normal activities the following day.  Follow your caregiver's specific instructions for the type of device that you have.  Only take over-the-counter or prescription medicines as directed by your caregiver.  Keep the insertion site of the catheter clean and dry at all times.  Change the bandages (dressings) over the catheter site as directed by your caregiver.  Wash the area around the catheter site during each dressing change. Sponge bathe the area using a germ-killing (antiseptic) solution as directed by your caregiver.  Look for redness or swelling at the  insertion site during each dressing change.  Apply an antibiotic ointment as directed by your caregiver.  Flush your catheter as directed to keep it from becoming clogged.  Always wash your hands thoroughly before changing dressings or flushing the catheter.  Do notlet air enter the catheter.  Never open the cap at the catheter tip.  Always make sure there is no air in the syringe or in the tubing for infusions.   Do notlift anything heavy.  Do not drive until your caregiver approves.  Do not shower or bathe until your caregiver approves. When you shower or bathe, place a piece of plastic wrap over the catheter site. Do not allow the catheter site or the dressing to get wet. If taking a bath, do not allow the catheter to get submerged in the water. If the catheter was inserted through an arm vein:  Avoid wearing tight clothes or jewelry on the arm that has the catheter.   Do not sleep with your head on the arm that has the catheter.   Do not allow use of a blood pressure cuff on the arm that has the catheter.   Do not let anyone draw blood from the arm that has the catheter, except through the catheter itself. SEEK MEDICAL CARE IF:  You have bleeding at  the insertion site of the catheter.   You feel weak or nauseous.   Your catheter is not working properly.   You have redness, pain, swelling, and warmth at the insertion site.   You notice fluid draining from the insertion site.  SEEK IMMEDIATE MEDICAL CARE IF:  Your catheter breaks or has a hole in it.   Your catheter comes loose or gets pulled completely out. If this happens, hold firm pressure over the area with your hand or a clean cloth.   You have a fever.  You have chills.   Your catheter becomes totally blocked.   You have swelling in your arm, shoulder, neck, or face.   You have bleeding from the insertion site that does not stop.   You develop chest pain or have trouble breathing.    You feel dizzy or faint.  MAKE SURE YOU:  Understand these instructions.  Will watch your condition.  Will get help right away if you are not doing well or get worse. Document Released: 04/30/2012 Document Revised: 01/14/2013 Document Reviewed: 04/30/2012 [facility] Patient Information 2014 [place], [place].

## 2013-04-02 NOTE — Telephone Encounter (Signed)
Gave pt appt for flush Monday, wednesday and Friday next week

## 2013-04-03 ENCOUNTER — Other Ambulatory Visit (HOSPITAL_BASED_OUTPATIENT_CLINIC_OR_DEPARTMENT_OTHER): Payer: BC Managed Care – PPO

## 2013-04-03 ENCOUNTER — Telehealth: Payer: Self-pay | Admitting: Oncology

## 2013-04-03 ENCOUNTER — Telehealth: Payer: Self-pay | Admitting: *Deleted

## 2013-04-03 ENCOUNTER — Other Ambulatory Visit: Payer: Self-pay | Admitting: Oncology

## 2013-04-03 ENCOUNTER — Ambulatory Visit (HOSPITAL_BASED_OUTPATIENT_CLINIC_OR_DEPARTMENT_OTHER): Payer: BC Managed Care – PPO

## 2013-04-03 VITALS — BP 119/80 | HR 84 | Temp 97.8°F | Resp 18

## 2013-04-03 DIAGNOSIS — C9000 Multiple myeloma not having achieved remission: Secondary | ICD-10-CM

## 2013-04-03 DIAGNOSIS — Z5189 Encounter for other specified aftercare: Secondary | ICD-10-CM

## 2013-04-03 LAB — CBC WITH DIFFERENTIAL/PLATELET
BASO%: 1.8 % (ref 0.0–2.0)
Basophils Absolute: 0 10*3/uL (ref 0.0–0.1)
EOS%: 8.9 % — ABNORMAL HIGH (ref 0.0–7.0)
Eosinophils Absolute: 0.1 10*3/uL (ref 0.0–0.5)
HCT: 38.2 % — ABNORMAL LOW (ref 38.4–49.9)
HGB: 13.3 g/dL (ref 13.0–17.1)
LYMPH%: 69.6 % — ABNORMAL HIGH (ref 14.0–49.0)
MCH: 31.9 pg (ref 27.2–33.4)
MCHC: 34.8 g/dL (ref 32.0–36.0)
MCV: 91.6 fL (ref 79.3–98.0)
MONO#: 0 10*3/uL — ABNORMAL LOW (ref 0.1–0.9)
MONO%: 7.1 % (ref 0.0–14.0)
NEUT#: 0.1 10*3/uL — CL (ref 1.5–6.5)
NEUT%: 12.6 % — ABNORMAL LOW (ref 39.0–75.0)
Platelets: 91 10*3/uL — ABNORMAL LOW (ref 140–400)
RBC: 4.17 10*6/uL — ABNORMAL LOW (ref 4.20–5.82)
RDW: 13.4 % (ref 11.0–14.6)
WBC: 0.6 10*3/uL — CL (ref 4.0–10.3)
lymph#: 0.4 10*3/uL — ABNORMAL LOW (ref 0.9–3.3)
nRBC: 0 % (ref 0–0)

## 2013-04-03 LAB — COMPREHENSIVE METABOLIC PANEL (CC13)
ALT: 20 U/L (ref 0–55)
AST: 11 U/L (ref 5–34)
Albumin: 3.6 g/dL (ref 3.5–5.0)
Alkaline Phosphatase: 41 U/L (ref 40–150)
Anion Gap: 10 mEq/L (ref 3–11)
BUN: 16.2 mg/dL (ref 7.0–26.0)
CO2: 25 mEq/L (ref 22–29)
Calcium: 9.7 mg/dL (ref 8.4–10.4)
Chloride: 105 mEq/L (ref 98–109)
Creatinine: 0.8 mg/dL (ref 0.7–1.3)
Glucose: 132 mg/dl (ref 70–140)
Potassium: 4.2 mEq/L (ref 3.5–5.1)
Sodium: 140 mEq/L (ref 136–145)
Total Bilirubin: 0.87 mg/dL (ref 0.20–1.20)
Total Protein: 8 g/dL (ref 6.4–8.3)

## 2013-04-03 LAB — MAGNESIUM (CC13): Magnesium: 2.2 mg/dl (ref 1.5–2.5)

## 2013-04-03 MED ORDER — FILGRASTIM 480 MCG/0.8ML IJ SOLN
960.0000 ug | Freq: Once | INTRAMUSCULAR | Status: AC
  Administered 2013-04-03: 960 ug via SUBCUTANEOUS
  Filled 2013-04-03: qty 1.6

## 2013-04-03 NOTE — Progress Notes (Signed)
Pt here for Neupogen injection.  CBC results showed to Lonna Cobb, NP.   Pt informed nurse that he has been taking Augmentin  1 tab  Twice daily since  Mon 03/30/13 from [facility] after chemo on Fri. 03/27/13. Message relayed to William Hamburger, RN for Dr. Cyndie Chime. Reinforced neutropenic precautions with pt.  Pt voiced understanding at discharge.

## 2013-04-03 NOTE — Telephone Encounter (Signed)
Faxed CBC and CMET/Mag to Shelly Coss @[facility]  @10 :30am.  Written note on fax that patient was on Augmentin BID already, did they want him to start Cipro? 2:30pm.  Did not hear from them.  Called and left message [phone] asking if they got the fax and if we needed to do anything in regards to Cipro, please call us and let us know.

## 2013-04-03 NOTE — Telephone Encounter (Signed)
returned pt wife call and cx 11.12.14 pt will be at [facility]

## 2013-04-04 ENCOUNTER — Ambulatory Visit (HOSPITAL_BASED_OUTPATIENT_CLINIC_OR_DEPARTMENT_OTHER): Payer: BC Managed Care – PPO

## 2013-04-04 VITALS — BP 110/71 | HR 73 | Temp 97.1°F

## 2013-04-04 DIAGNOSIS — C9000 Multiple myeloma not having achieved remission: Secondary | ICD-10-CM

## 2013-04-04 DIAGNOSIS — Z5189 Encounter for other specified aftercare: Secondary | ICD-10-CM

## 2013-04-04 MED ORDER — FILGRASTIM 480 MCG/0.8ML IJ SOLN
960.0000 ug | Freq: Once | INTRAMUSCULAR | Status: AC
  Administered 2013-04-04: 960 ug via SUBCUTANEOUS

## 2013-04-04 NOTE — Patient Instructions (Signed)
Filgrastim, G-CSF injection What is this medicine? FILGRASTIM, G-CSF (fil GRA stim) stimulates the formation of white blood cells. This medicine is given to patients with conditions that may cause a decrease in white blood cells, like those receiving certain types of chemotherapy or bone marrow transplant. It helps the bone marrow recover its ability to produce white blood cells. Increasing the amount of white blood cells helps to decrease the risk of infection and fever. This medicine may be used for other purposes; ask your health care provider or pharmacist if you have questions. COMMON BRAND NAME(S): Neupogen What should I tell my health care provider before I take this medicine? They need to know if you have any of these conditions: -currently receiving radiation therapy -sickle cell disease -an unusual or allergic reaction to filgrastim, E. coli protein, other medicines, foods, dyes, or preservatives -pregnant or trying to get pregnant -breast-feeding How should I use this medicine? This medicine is for injection into a vein or injection under the skin. It is usually given by a health care professional in a hospital or clinic setting. If you get this medicine at home, you will be taught how to prepare and give this medicine. Always change the site for the injection under the skin. Let the solution warm to room temperature before you use it. Do not shake the solution before you withdraw a dose. Throw away any unused portion. Use exactly as directed. Take your medicine at regular intervals. Do not take your medicine more often than directed. It is important that you put your used needles and syringes in a special sharps container. Do not put them in a trash can. If you do not have a sharps container, call your pharmacist or healthcare provider to get one. Talk to your pediatrician regarding the use of this medicine in children. While this medicine may be prescribed for children for selected  conditions, precautions do apply. Overdosage: If you think you have taken too much of this medicine contact a poison control center or emergency room at once. NOTE: This medicine is only for you. Do not share this medicine with others. What if I miss a dose? Try not to miss doses. If you miss a dose take the dose as soon as you remember. If it is almost time for the next dose, do not take double doses unless told to by your doctor or health care professional. What may interact with this medicine? -lithium -medicines for cancer chemotherapy This list may not describe all possible interactions. Give your health care provider a list of all the medicines, herbs, non-prescription drugs, or dietary supplements you use. Also tell them if you smoke, drink alcohol, or use illegal drugs. Some items may interact with your medicine. What should I watch for while using this medicine? Visit your doctor or health care professional for regular checks on your progress. If you get a fever or any sign of infection while you are using this medicine, do not treat yourself. Check with your doctor or health care professional. Bone pain can usually be relieved by mild pain relievers such as acetaminophen or ibuprofen. Check with your doctor or health care professional before taking these medicines as they may hide a fever. Call your doctor or health care professional if the aches and pains are severe or do not go away. What side effects may I notice from receiving this medicine? Side effects that you should report to your doctor or health care professional as soon as possible: -allergic reactions   like skin rash, itching or hives, swelling of the face, lips, or tongue -difficulty breathing, wheezing -fever -pain, redness, or swelling at the injection site -stomach or side pain, or pain at the shoulder Side effects that usually do not require medical attention (report to your doctor or health care professional if they  continue or are bothersome): -bone pain (ribs, lower back, breast bone) -headache -skin rash This list may not describe all possible side effects. Call your doctor for medical advice about side effects. You may report side effects to FDA at 1-800-FDA-1088. Where should I keep my medicine? Keep out of the reach of children. Store in a refrigerator between 2 and 8 degrees C (36 and 46 degrees F). Do not freeze or leave in direct sunlight. If vials or syringes are left out of the refrigerator for more than 24 hours, they must be thrown away. Throw away unused vials after the expiration date on the carton. NOTE: This sheet is a summary. It may not cover all possible information. If you have questions about this medicine, talk to your doctor, pharmacist, or health care provider.  2014, Elsevier/Gold Standard. (2007-07-30 13:33:21)  

## 2013-04-04 NOTE — Progress Notes (Signed)
Patient complains of pain in his sacrum radiating down posterior legs. Patient states pain is a 10 on a scale of 0-10, patient describes the pain as throbbing. Patient has narcotics at home which he states does not relieve the pain. Patient states pain had a sudden onset last night. Dr. Welton Flakes whom is on call was paged. Ok to give neupogen 960 mcg and take another of his oxycodone 5 mg as he took one 40 minutes ago per Dr. Welton Flakes.

## 2013-04-06 ENCOUNTER — Ambulatory Visit (HOSPITAL_BASED_OUTPATIENT_CLINIC_OR_DEPARTMENT_OTHER): Payer: BC Managed Care – PPO

## 2013-04-06 ENCOUNTER — Other Ambulatory Visit: Payer: Self-pay | Admitting: Emergency Medicine

## 2013-04-06 ENCOUNTER — Telehealth: Payer: Self-pay | Admitting: *Deleted

## 2013-04-06 ENCOUNTER — Other Ambulatory Visit (HOSPITAL_BASED_OUTPATIENT_CLINIC_OR_DEPARTMENT_OTHER): Payer: BC Managed Care – PPO

## 2013-04-06 ENCOUNTER — Ambulatory Visit: Payer: BC Managed Care – PPO

## 2013-04-06 VITALS — BP 150/71 | HR 93 | Temp 98.2°F | Resp 16

## 2013-04-06 DIAGNOSIS — C9000 Multiple myeloma not having achieved remission: Secondary | ICD-10-CM

## 2013-04-06 DIAGNOSIS — Z5189 Encounter for other specified aftercare: Secondary | ICD-10-CM

## 2013-04-06 LAB — CBC WITH DIFFERENTIAL/PLATELET
BASO%: 1.4 % (ref 0.0–2.0)
Basophils Absolute: 0.1 10*3/uL (ref 0.0–0.1)
EOS%: 1.6 % (ref 0.0–7.0)
Eosinophils Absolute: 0.1 10*3/uL (ref 0.0–0.5)
HCT: 35.2 % — ABNORMAL LOW (ref 38.4–49.9)
HGB: 12.1 g/dL — ABNORMAL LOW (ref 13.0–17.1)
LYMPH%: 11.9 % — ABNORMAL LOW (ref 14.0–49.0)
MCH: 32 pg (ref 27.2–33.4)
MCHC: 34.4 g/dL (ref 32.0–36.0)
MCV: 93.1 fL (ref 79.3–98.0)
MONO#: 1.8 10*3/uL — ABNORMAL HIGH (ref 0.1–0.9)
MONO%: 34.6 % — ABNORMAL HIGH (ref 0.0–14.0)
NEUT#: 2.6 10*3/uL (ref 1.5–6.5)
NEUT%: 50.5 % (ref 39.0–75.0)
Platelets: 95 10*3/uL — ABNORMAL LOW (ref 140–400)
RBC: 3.78 10*6/uL — ABNORMAL LOW (ref 4.20–5.82)
RDW: 14.1 % (ref 11.0–14.6)
WBC: 5.1 10*3/uL (ref 4.0–10.3)
lymph#: 0.6 10*3/uL — ABNORMAL LOW (ref 0.9–3.3)
nRBC: 2 % — ABNORMAL HIGH (ref 0–0)

## 2013-04-06 LAB — MAGNESIUM (CC13): Magnesium: 2.1 mg/dl (ref 1.5–2.5)

## 2013-04-06 LAB — COMPREHENSIVE METABOLIC PANEL (CC13)
ALT: 17 U/L (ref 0–55)
AST: 16 U/L (ref 5–34)
Albumin: 3.5 g/dL (ref 3.5–5.0)
Alkaline Phosphatase: 44 U/L (ref 40–150)
Anion Gap: 12 mEq/L — ABNORMAL HIGH (ref 3–11)
BUN: 10 mg/dL (ref 7.0–26.0)
CO2: 22 mEq/L (ref 22–29)
Calcium: 9.2 mg/dL (ref 8.4–10.4)
Chloride: 106 mEq/L (ref 98–109)
Creatinine: 0.8 mg/dL (ref 0.7–1.3)
Glucose: 123 mg/dl (ref 70–140)
Potassium: 4 mEq/L (ref 3.5–5.1)
Sodium: 140 mEq/L (ref 136–145)
Total Bilirubin: 0.5 mg/dL (ref 0.20–1.20)
Total Protein: 7.5 g/dL (ref 6.4–8.3)

## 2013-04-06 LAB — LACTATE DEHYDROGENASE: LDH: 380 U/L — ABNORMAL HIGH (ref 94–250)

## 2013-04-06 MED ORDER — HEPARIN SOD (PORK) LOCK FLUSH 100 UNIT/ML IV SOLN
500.0000 [IU] | Freq: Once | INTRAVENOUS | Status: AC
  Administered 2013-04-06: 500 [IU] via INTRAVENOUS
  Filled 2013-04-06: qty 5

## 2013-04-06 MED ORDER — FILGRASTIM 480 MCG/0.8ML IJ SOLN
960.0000 ug | Freq: Once | INTRAMUSCULAR | Status: AC
  Administered 2013-04-06: 960 ug via SUBCUTANEOUS
  Filled 2013-04-06: qty 1.6

## 2013-04-06 MED ORDER — SODIUM CHLORIDE 0.9 % IJ SOLN
10.0000 mL | INTRAMUSCULAR | Status: DC | PRN
  Administered 2013-04-06: 10 mL via INTRAVENOUS
  Filled 2013-04-06: qty 10

## 2013-04-06 NOTE — Telephone Encounter (Addendum)
Pt left vm message stating tha he has had bad indigestion over past couple of days with belching.  He reports that he has taken zantac & omeprazole with some help but wanted to know if Dr Cyndie Chime has suggestions.  Call back # is [phone]. Pt informed to try some OTC mylanta per Dr Cyndie Chime.

## 2013-04-06 NOTE — Telephone Encounter (Signed)
Received vm call from [facility] stating that pt doesn not need cipro, only augmentin bid & he will return to [facility] tomorrow for stem cell collection.

## 2013-04-09 ENCOUNTER — Telehealth: Payer: Self-pay | Admitting: Oncology

## 2013-04-09 NOTE — Telephone Encounter (Signed)
Pt called and wants to cancel flush appt

## 2013-05-01 ENCOUNTER — Telehealth: Payer: Self-pay | Admitting: *Deleted

## 2013-05-01 NOTE — Telephone Encounter (Signed)
Received call from Durel Salts, NP from [facility] wanting to set up appt for pt to be seen next week.  Pt is scheduled 05/15/13 @ 4 pm with Dr. Cyndie Chime and conveyed this information but Ms. Eren stated he needed to be seen next week instead.  Explained that Dr. Cyndie Chime out of the office until next week but will discuss with Theodora Blow, NP.  Peggy Eren's call back # [phone].

## 2013-05-01 NOTE — Telephone Encounter (Signed)
Left message for Durel Salts, NP @ [facility] relying information note will be left to make Dr. Cyndie Chime aware pt needs appt next week.  Per Lonna Cobb, NP --No availability seen on schedule; as well as on Lonna Cobb, NP schedule and will notify office next week with regards to new appt.  Re-iterated that Dr. Cyndie Chime will be back in the office next week and to call if any questions.

## 2013-05-05 ENCOUNTER — Other Ambulatory Visit: Payer: Self-pay | Admitting: *Deleted

## 2013-05-05 DIAGNOSIS — C9000 Multiple myeloma not having achieved remission: Secondary | ICD-10-CM

## 2013-05-06 ENCOUNTER — Telehealth: Payer: Self-pay | Admitting: Oncology

## 2013-05-06 ENCOUNTER — Telehealth: Payer: Self-pay | Admitting: *Deleted

## 2013-05-06 NOTE — Telephone Encounter (Signed)
Gave pt appt for labs and MD on December 2014

## 2013-05-06 NOTE — Telephone Encounter (Signed)
Reached Durel Salts NP/[facility] & she is OK with pt not being seen this week by MD as long as we get labs & pt knows to call if any problems.  She states that he looked good when seen there & the only negative thing with this pt is that he had one positive blood culture & his central line was pulled & f/u blood cultures were negative.  He only took oral cipro & did well.  Message left at home # that we would not see him this week unless he had a problem & to certainly call if he does.  We will see him for labs tomorrow.  Note to Dr. Cyndie Chime.

## 2013-05-07 ENCOUNTER — Other Ambulatory Visit (HOSPITAL_BASED_OUTPATIENT_CLINIC_OR_DEPARTMENT_OTHER): Payer: BC Managed Care – PPO

## 2013-05-07 DIAGNOSIS — C9000 Multiple myeloma not having achieved remission: Secondary | ICD-10-CM

## 2013-05-07 LAB — COMPREHENSIVE METABOLIC PANEL (CC13)
ALT: 27 U/L (ref 0–55)
AST: 19 U/L (ref 5–34)
Albumin: 3.4 g/dL — ABNORMAL LOW (ref 3.5–5.0)
Alkaline Phosphatase: 44 U/L (ref 40–150)
Anion Gap: 10 mEq/L (ref 3–11)
BUN: 12.8 mg/dL (ref 7.0–26.0)
CO2: 24 mEq/L (ref 22–29)
Calcium: 9.1 mg/dL (ref 8.4–10.4)
Chloride: 108 mEq/L (ref 98–109)
Creatinine: 0.8 mg/dL (ref 0.7–1.3)
Glucose: 118 mg/dl (ref 70–140)
Potassium: 4.1 mEq/L (ref 3.5–5.1)
Sodium: 142 mEq/L (ref 136–145)
Total Bilirubin: 0.34 mg/dL (ref 0.20–1.20)
Total Protein: 7.9 g/dL (ref 6.4–8.3)

## 2013-05-07 LAB — CBC WITH DIFFERENTIAL/PLATELET
BASO%: 1.1 % (ref 0.0–2.0)
Basophils Absolute: 0 10*3/uL (ref 0.0–0.1)
EOS%: 0 % (ref 0.0–7.0)
Eosinophils Absolute: 0 10*3/uL (ref 0.0–0.5)
HCT: 29.7 % — ABNORMAL LOW (ref 38.4–49.9)
HGB: 10.1 g/dL — ABNORMAL LOW (ref 13.0–17.1)
LYMPH%: 22 % (ref 14.0–49.0)
MCH: 33.3 pg (ref 27.2–33.4)
MCHC: 34.1 g/dL (ref 32.0–36.0)
MCV: 97.6 fL (ref 79.3–98.0)
MONO#: 0.9 10*3/uL (ref 0.1–0.9)
MONO%: 26.1 % — ABNORMAL HIGH (ref 0.0–14.0)
NEUT#: 1.8 10*3/uL (ref 1.5–6.5)
NEUT%: 50.8 % (ref 39.0–75.0)
Platelets: 108 10*3/uL — ABNORMAL LOW (ref 140–400)
RBC: 3.05 10*6/uL — ABNORMAL LOW (ref 4.20–5.82)
RDW: 15.4 % — ABNORMAL HIGH (ref 11.0–14.6)
WBC: 3.6 10*3/uL — ABNORMAL LOW (ref 4.0–10.3)
lymph#: 0.8 10*3/uL — ABNORMAL LOW (ref 0.9–3.3)

## 2013-05-07 LAB — MAGNESIUM (CC13): Magnesium: 2.1 mg/dl (ref 1.5–2.5)

## 2013-05-08 ENCOUNTER — Telehealth: Payer: Self-pay | Admitting: *Deleted

## 2013-05-08 ENCOUNTER — Telehealth: Payer: Self-pay | Admitting: Oncology

## 2013-05-08 NOTE — Telephone Encounter (Signed)
Gave pt appt for lab and md r/s from 12/19 per md

## 2013-05-08 NOTE — Telephone Encounter (Signed)
Message copied by Sabino Snipes on Fri May 08, 2013  4:06 PM ------      Message from: Levert Feinstein      Created: Thu May 07, 2013  4:40 PM       Call pt: labs look good ------

## 2013-05-08 NOTE — Telephone Encounter (Addendum)
Pt's wife returned call & informed that pt's labs looked OK.

## 2013-05-11 ENCOUNTER — Telehealth: Payer: Self-pay | Admitting: *Deleted

## 2013-05-11 NOTE — Telephone Encounter (Signed)
Labs faxed to [facility]/Dr Gasperetto/Atten Mikeal Hawthorne @ [phone].

## 2013-05-11 NOTE — Telephone Encounter (Signed)
Message copied by Sabino Snipes on Mon May 11, 2013  3:59 PM ------      Message from: Levert Feinstein      Created: Sat May 09, 2013  3:30 PM       Fax copy lab to [facility] BMT please ------

## 2013-05-13 ENCOUNTER — Other Ambulatory Visit: Payer: BC Managed Care – PPO

## 2013-05-14 ENCOUNTER — Other Ambulatory Visit (HOSPITAL_BASED_OUTPATIENT_CLINIC_OR_DEPARTMENT_OTHER): Payer: BC Managed Care – PPO

## 2013-05-14 ENCOUNTER — Telehealth: Payer: Self-pay | Admitting: Oncology

## 2013-05-14 ENCOUNTER — Ambulatory Visit (HOSPITAL_BASED_OUTPATIENT_CLINIC_OR_DEPARTMENT_OTHER): Payer: BC Managed Care – PPO | Admitting: Oncology

## 2013-05-14 VITALS — BP 118/82 | HR 77 | Temp 97.5°F | Resp 18 | Ht 65.0 in | Wt 171.3 lb

## 2013-05-14 DIAGNOSIS — C9000 Multiple myeloma not having achieved remission: Secondary | ICD-10-CM

## 2013-05-14 LAB — COMPREHENSIVE METABOLIC PANEL (CC13)
ALT: 26 U/L (ref 0–55)
AST: 22 U/L (ref 5–34)
Albumin: 3.5 g/dL (ref 3.5–5.0)
Alkaline Phosphatase: 40 U/L (ref 40–150)
Anion Gap: 5 mEq/L (ref 3–11)
BUN: 12.8 mg/dL (ref 7.0–26.0)
CO2: 25 mEq/L (ref 22–29)
Calcium: 9.1 mg/dL (ref 8.4–10.4)
Chloride: 96 mEq/L — ABNORMAL LOW (ref 98–109)
Creatinine: 0.7 mg/dL (ref 0.7–1.3)
Glucose: 89 mg/dl (ref 70–140)
Potassium: 3.6 mEq/L (ref 3.5–5.1)
Sodium: 126 mEq/L — ABNORMAL LOW (ref 136–145)
Total Bilirubin: 0.49 mg/dL (ref 0.20–1.20)
Total Protein: 7.8 g/dL (ref 6.4–8.3)

## 2013-05-14 LAB — CBC WITH DIFFERENTIAL/PLATELET
BASO%: 1.2 % (ref 0.0–2.0)
Basophils Absolute: 0 10*3/uL (ref 0.0–0.1)
EOS%: 0.6 % (ref 0.0–7.0)
Eosinophils Absolute: 0 10*3/uL (ref 0.0–0.5)
HCT: 29.8 % — ABNORMAL LOW (ref 38.4–49.9)
HGB: 10.3 g/dL — ABNORMAL LOW (ref 13.0–17.1)
LYMPH%: 36 % (ref 14.0–49.0)
MCH: 33.3 pg (ref 27.2–33.4)
MCHC: 34.3 g/dL (ref 32.0–36.0)
MCV: 96.9 fL (ref 79.3–98.0)
MONO#: 0.9 10*3/uL (ref 0.1–0.9)
MONO%: 24 % — ABNORMAL HIGH (ref 0.0–14.0)
NEUT#: 1.5 10*3/uL (ref 1.5–6.5)
NEUT%: 38.2 % — ABNORMAL LOW (ref 39.0–75.0)
Platelets: 194 10*3/uL (ref 140–400)
RBC: 3.08 10*6/uL — ABNORMAL LOW (ref 4.20–5.82)
RDW: 16.6 % — ABNORMAL HIGH (ref 11.0–14.6)
WBC: 3.9 10*3/uL — ABNORMAL LOW (ref 4.0–10.3)
lymph#: 1.4 10*3/uL (ref 0.9–3.3)

## 2013-05-14 LAB — LACTATE DEHYDROGENASE (CC13): LDH: 269 U/L — ABNORMAL HIGH (ref 125–245)

## 2013-05-14 LAB — MAGNESIUM (CC13): Magnesium: 2.2 mg/dL (ref 1.5–2.5)

## 2013-05-14 NOTE — Progress Notes (Signed)
Hematology and Oncology Follow Up Visit  [name] [id number] April 27, 1961 52 y.o. 05/14/2013 7:00 PM   Principle Diagnosis: Encounter Diagnosis  Name Primary?  . Multiple myeloma, without mention of having achieved remission(203.00) Yes     Interim History: Followup visit for this 52 year old man with IgG kappa multiple myeloma initially diagnosed in May of this year. He achieved an excellent partial response with induction RVD given between May 30 and 03/13/2013. Prior to transplant, total IgG down from 4 g as diagnosis on 4/16 to 1.5 grams by September 12. Free kappa light chains down from 27.9 mg percent to 5.44 by October 3. Bone marrow with 22% plasma cells on September 17 not much change from pre-treatment value of 25%. He just successfully completed consolidation chemotherapy with IV melphalan 200 mg per meter squared on 04/16/2013 with autologous stem cell support on 04/17/2013  given at [facility]. Overall he did extremely well. He stayed in a local hotel through his nadir. He did develop fever and neutropenia. Blood cultures grew Pseudomonas on November 29.Marland Kitchen He received outpatient IV antibiotics. His central catheter was removed with almost immediate improvement in his clinical status. Followup cultures on December 1 were negative. He was put on antiviral prophylaxis with acyclovir and antifungal prophylaxis with fluconazole. Fluconazole course now completed. He will continue acyclovir for one year post transplant.  He experienced significant nausea and lost his taste for food until his blood counts recovered. Counts look excellent today with hemoglobin 10.3, white count 3900 with 38% neutrophils, 36% lymphocytes, 24% monocytes, and platelet count 194,000.  Medications: reviewed  Allergies:  Allergies  Allergen Reactions  . Toprol Xl [Metoprolol Tartrate]     Intolerance-  Made him tired    Review of Systems: Hematology:  See above ENT ROS: No sore  throat Breast ROS:  Respiratory ROS: No cough or dyspnea Cardiovascular ROS:   No chest pain or palpitations Gastrointestinal ROS:  No abdominal pain or change in bowel habit Genito-Urinary ROS: No urinary tract symptoms Musculoskeletal ROS no muscle or bone pain Neurological ROS: No headache or change in vision Dermatological ROS: No rash Remaining ROS negative.  Physical Exam: Blood pressure 118/82, pulse 77, temperature 97.5 F (36.4 C), temperature source Oral, resp. rate 18, height 5\' 5"  (1.651 m), weight 171 lb 4.8 oz (77.701 kg). Wt Readings from Last 3 Encounters:  05/14/13 171 lb 4.8 oz (77.701 kg)  03/23/13 175 lb 2 oz (79.436 kg)  03/11/13 178 lb (80.74 kg)     General appearance: Well-nourished Caucasian man  HENNT: Total alopecia, Pharynx no erythema, exudate, mass, or ulcer. No thyromegaly or thyroid nodules Lymph nodes: No cervical, supraclavicular, or axillary lymphadenopathy Breasts:  Lungs: Clear to auscultation, resonant to percussion throughout Heart: Regular rhythm, blowing 3/6 systolic murmur left sternal border murmur, no gallop, no rub, no click, no edema Abdomen: Soft, nontender, normal bowel sounds, no mass, no organomegaly Extremities: No edema, no calf tenderness Musculoskeletal: no joint deformities GU:  Vascular: Carotid pulses 2+, no bruits, distal pulses: Dorsalis pedis 1+ symmetric Neurologic: Alert, oriented, PERRLA, optic discs sharp and vessels normal, no hemorrhage or exudate, cranial nerves grossly normal, motor strength 5 over 5, reflexes 1+ symmetric, upper body coordination normal, gait normal, Skin: No rash or ecchymosis  Lab Results: CBC W/Diff    Component Value Date/Time   WBC 3.9* 05/14/2013 1331   WBC 2.8* 02/11/2013 0915   RBC 3.08* 05/14/2013 1331   RBC 4.14* 02/11/2013 0915   HGB 10.3*  05/14/2013 1331   HGB 13.3 02/11/2013 0915   HCT 29.8* 05/14/2013 1331   HCT 38.5* 02/11/2013 0915   PLT 194 05/14/2013 1331   PLT 157  02/11/2013 0915   MCV 96.9 05/14/2013 1331   MCV 93.0 02/11/2013 0915   MCH 33.3 05/14/2013 1331   MCH 32.1 02/11/2013 0915   MCHC 34.3 05/14/2013 1331   MCHC 34.5 02/11/2013 0915   RDW 16.6* 05/14/2013 1331   RDW 13.9 02/11/2013 0915   LYMPHSABS 1.4 05/14/2013 1331   LYMPHSABS 0.4* 12/23/2012 2211   MONOABS 0.9 05/14/2013 1331   MONOABS 0.3 12/23/2012 2211   EOSABS 0.0 05/14/2013 1331   EOSABS 0.0 12/23/2012 2211   BASOSABS 0.0 05/14/2013 1331   BASOSABS 0.0 12/23/2012 2211     Chemistry      Component Value Date/Time   NA 126* 05/14/2013 1331   NA 134* 12/23/2012 2211   K 3.6 05/14/2013 1331   K 3.9 12/23/2012 2211   CL 98 12/23/2012 2211   CL 103 10/31/2012 1116   CO2 25 05/14/2013 1331   CO2 26 12/23/2012 2211   BUN 12.8 05/14/2013 1331   BUN 8 12/23/2012 2211   CREATININE 0.7 05/14/2013 1331   CREATININE 0.85 12/23/2012 2211   CREATININE 0.89 10/11/2010 0949      Component Value Date/Time   CALCIUM 9.1 05/14/2013 1331   CALCIUM 8.7 12/23/2012 2211   ALKPHOS 40 05/14/2013 1331   ALKPHOS 31* 12/23/2012 2211   AST 22 05/14/2013 1331   AST 14 12/23/2012 2211   ALT 26 05/14/2013 1331   ALT 16 12/23/2012 2211   BILITOT 0.49 05/14/2013 1331   BILITOT 1.1 12/23/2012 2211       Impression:  #1. IgG kappa multiple myeloma He remains stable now one month post high dose IV melphalan with autologous stem cell support. He will see the [facility] team on January 15. He will likely get put on Revlimid maintenance. Weekly blood counts until that visit. Continue acyclovir prophylaxis. Radiate all blood products going into the future.  #2. Recent pseudomonas sepsis successfully treated  #3. Coronary artery disease status post 5 vessel bypass graft in 2005.  #4. Aortic Valvular heart disease  #5. Hyperlipidemia  #6. Mitral valve prolapse.   CC: Patient Care Team: Ronnald Nian, MD as PCP - General (Family Medicine) Levert Feinstein, MD as Consulting Physician (Oncology) Marykay Lex, MD as Consulting Physician (Cardiology) Eddie Candle as Consulting Physician (Internal Medicine)   Levert Feinstein, MD 12/18/20147:00 PM

## 2013-05-14 NOTE — Telephone Encounter (Signed)
Gave pt appt for lab q week and see MD on Janaury 2015

## 2013-05-15 ENCOUNTER — Ambulatory Visit: Payer: BC Managed Care – PPO | Admitting: Oncology

## 2013-05-15 ENCOUNTER — Other Ambulatory Visit: Payer: BC Managed Care – PPO

## 2013-05-20 ENCOUNTER — Other Ambulatory Visit (HOSPITAL_BASED_OUTPATIENT_CLINIC_OR_DEPARTMENT_OTHER): Payer: BC Managed Care – PPO

## 2013-05-20 DIAGNOSIS — C9000 Multiple myeloma not having achieved remission: Secondary | ICD-10-CM

## 2013-05-20 LAB — COMPREHENSIVE METABOLIC PANEL (CC13)
ALT: 29 U/L (ref 0–55)
AST: 24 U/L (ref 5–34)
Albumin: 3.6 g/dL (ref 3.5–5.0)
Alkaline Phosphatase: 42 U/L (ref 40–150)
Anion Gap: 11 mEq/L (ref 3–11)
BUN: 12.4 mg/dL (ref 7.0–26.0)
CO2: 25 mEq/L (ref 22–29)
Calcium: 8.9 mg/dL (ref 8.4–10.4)
Chloride: 107 mEq/L (ref 98–109)
Creatinine: 0.8 mg/dL (ref 0.7–1.3)
Glucose: 127 mg/dl (ref 70–140)
Potassium: 4 mEq/L (ref 3.5–5.1)
Sodium: 143 mEq/L (ref 136–145)
Total Bilirubin: 0.48 mg/dL (ref 0.20–1.20)
Total Protein: 8 g/dL (ref 6.4–8.3)

## 2013-05-20 LAB — CBC WITH DIFFERENTIAL/PLATELET
BASO%: 1.3 % (ref 0.0–2.0)
Basophils Absolute: 0 10*3/uL (ref 0.0–0.1)
EOS%: 3 % (ref 0.0–7.0)
Eosinophils Absolute: 0.1 10*3/uL (ref 0.0–0.5)
HCT: 33.8 % — ABNORMAL LOW (ref 38.4–49.9)
HGB: 10.8 g/dL — ABNORMAL LOW (ref 13.0–17.1)
LYMPH%: 38.6 % (ref 14.0–49.0)
MCH: 31.5 pg (ref 27.2–33.4)
MCHC: 32 g/dL (ref 32.0–36.0)
MCV: 98.2 fL — ABNORMAL HIGH (ref 79.3–98.0)
MONO#: 0.6 10*3/uL (ref 0.1–0.9)
MONO%: 17.3 % — ABNORMAL HIGH (ref 0.0–14.0)
NEUT#: 1.3 10*3/uL — ABNORMAL LOW (ref 1.5–6.5)
NEUT%: 39.8 % (ref 39.0–75.0)
Platelets: 176 10*3/uL (ref 140–400)
RBC: 3.45 10*6/uL — ABNORMAL LOW (ref 4.20–5.82)
RDW: 17.1 % — ABNORMAL HIGH (ref 11.0–14.6)
WBC: 3.3 10*3/uL — ABNORMAL LOW (ref 4.0–10.3)
lymph#: 1.3 10*3/uL (ref 0.9–3.3)

## 2013-05-20 LAB — MAGNESIUM (CC13): Magnesium: 2.2 mg/dl (ref 1.5–2.5)

## 2013-05-25 ENCOUNTER — Telehealth: Payer: Self-pay | Admitting: Oncology

## 2013-05-25 NOTE — Telephone Encounter (Signed)
Pt called and r/s labs from 05/29/13 to 12/31

## 2013-05-26 ENCOUNTER — Encounter (HOSPITAL_COMMUNITY): Payer: Self-pay

## 2013-05-27 ENCOUNTER — Other Ambulatory Visit (HOSPITAL_BASED_OUTPATIENT_CLINIC_OR_DEPARTMENT_OTHER): Payer: BC Managed Care – PPO

## 2013-05-27 DIAGNOSIS — C9000 Multiple myeloma not having achieved remission: Secondary | ICD-10-CM

## 2013-05-27 LAB — MAGNESIUM (CC13): Magnesium: 2 mg/dl (ref 1.5–2.5)

## 2013-05-27 LAB — COMPREHENSIVE METABOLIC PANEL (CC13)
ALT: 43 U/L (ref 0–55)
AST: 27 U/L (ref 5–34)
Albumin: 3.8 g/dL (ref 3.5–5.0)
Alkaline Phosphatase: 42 U/L (ref 40–150)
Anion Gap: 11 mEq/L (ref 3–11)
BUN: 14.3 mg/dL (ref 7.0–26.0)
CO2: 23 mEq/L (ref 22–29)
Calcium: 9.1 mg/dL (ref 8.4–10.4)
Chloride: 106 mEq/L (ref 98–109)
Creatinine: 0.8 mg/dL (ref 0.7–1.3)
Glucose: 94 mg/dl (ref 70–140)
Potassium: 4.1 mEq/L (ref 3.5–5.1)
Sodium: 140 mEq/L (ref 136–145)
Total Bilirubin: 0.57 mg/dL (ref 0.20–1.20)
Total Protein: 8 g/dL (ref 6.4–8.3)

## 2013-05-27 LAB — CBC WITH DIFFERENTIAL/PLATELET
BASO%: 1.2 % (ref 0.0–2.0)
Basophils Absolute: 0 10*3/uL (ref 0.0–0.1)
EOS%: 3.6 % (ref 0.0–7.0)
Eosinophils Absolute: 0.1 10*3/uL (ref 0.0–0.5)
HCT: 33.4 % — ABNORMAL LOW (ref 38.4–49.9)
HGB: 11.5 g/dL — ABNORMAL LOW (ref 13.0–17.1)
LYMPH%: 37.6 % (ref 14.0–49.0)
MCH: 33.6 pg — ABNORMAL HIGH (ref 27.2–33.4)
MCHC: 34.5 g/dL (ref 32.0–36.0)
MCV: 97.5 fL (ref 79.3–98.0)
MONO#: 0.6 10*3/uL (ref 0.1–0.9)
MONO%: 16.6 % — ABNORMAL HIGH (ref 0.0–14.0)
NEUT#: 1.5 10*3/uL (ref 1.5–6.5)
NEUT%: 41 % (ref 39.0–75.0)
Platelets: 190 10*3/uL (ref 140–400)
RBC: 3.42 10*6/uL — ABNORMAL LOW (ref 4.20–5.82)
RDW: 16.1 % — ABNORMAL HIGH (ref 11.0–14.6)
WBC: 3.5 10*3/uL — ABNORMAL LOW (ref 4.0–10.3)
lymph#: 1.3 10*3/uL (ref 0.9–3.3)

## 2013-05-29 ENCOUNTER — Telehealth: Payer: Self-pay | Admitting: *Deleted

## 2013-05-29 ENCOUNTER — Telehealth: Payer: Self-pay | Admitting: Internal Medicine

## 2013-05-29 ENCOUNTER — Other Ambulatory Visit: Payer: BC Managed Care – PPO

## 2013-05-29 NOTE — Telephone Encounter (Signed)
Message copied by Ignacia Felling on Fri May 29, 2013 10:50 AM ------      Message from: Annia Belt      Created: Wed May 27, 2013  3:56 PM       Call: labs good  Cc Dr Greer Pickerel BMT ------

## 2013-05-29 NOTE — Telephone Encounter (Signed)
Pt needs a refill on levitra 20mg  to [facility] in [place]

## 2013-05-29 NOTE — Telephone Encounter (Signed)
Spoke with patient.  Let himknow that labs are good.  He appreciated the phone call.  Labs routed to Dr. Alvie Heidelberg via Epic routing.

## 2013-05-31 MED ORDER — VARDENAFIL HCL 20 MG PO TABS: 20.0000 mg | ORAL_TABLET | Freq: Every day | ORAL | Status: DC | PRN

## 2013-06-04 ENCOUNTER — Other Ambulatory Visit (HOSPITAL_BASED_OUTPATIENT_CLINIC_OR_DEPARTMENT_OTHER): Payer: BC Managed Care – PPO

## 2013-06-04 ENCOUNTER — Telehealth: Payer: Self-pay | Admitting: *Deleted

## 2013-06-04 DIAGNOSIS — C9000 Multiple myeloma not having achieved remission: Secondary | ICD-10-CM

## 2013-06-04 LAB — CBC WITH DIFFERENTIAL/PLATELET
BASO%: 0.9 % (ref 0.0–2.0)
Basophils Absolute: 0 10*3/uL (ref 0.0–0.1)
EOS%: 3.1 % (ref 0.0–7.0)
Eosinophils Absolute: 0.1 10*3/uL (ref 0.0–0.5)
HCT: 35.4 % — ABNORMAL LOW (ref 38.4–49.9)
HGB: 11.9 g/dL — ABNORMAL LOW (ref 13.0–17.1)
LYMPH%: 40 % (ref 14.0–49.0)
MCH: 33.2 pg (ref 27.2–33.4)
MCHC: 33.7 g/dL (ref 32.0–36.0)
MCV: 98.5 fL — ABNORMAL HIGH (ref 79.3–98.0)
MONO#: 0.4 10*3/uL (ref 0.1–0.9)
MONO%: 15.5 % — ABNORMAL HIGH (ref 0.0–14.0)
NEUT#: 1.1 10*3/uL — ABNORMAL LOW (ref 1.5–6.5)
NEUT%: 40.5 % (ref 39.0–75.0)
Platelets: 157 10*3/uL (ref 140–400)
RBC: 3.59 10*6/uL — ABNORMAL LOW (ref 4.20–5.82)
RDW: 15.8 % — ABNORMAL HIGH (ref 11.0–14.6)
WBC: 2.7 10*3/uL — ABNORMAL LOW (ref 4.0–10.3)
lymph#: 1.1 10*3/uL (ref 0.9–3.3)

## 2013-06-04 LAB — COMPREHENSIVE METABOLIC PANEL (CC13)
ALT: 28 U/L (ref 0–55)
AST: 19 U/L (ref 5–34)
Albumin: 3.8 g/dL (ref 3.5–5.0)
Alkaline Phosphatase: 40 U/L (ref 40–150)
Anion Gap: 10 mEq/L (ref 3–11)
BUN: 16.6 mg/dL (ref 7.0–26.0)
CO2: 25 mEq/L (ref 22–29)
Calcium: 8.9 mg/dL (ref 8.4–10.4)
Chloride: 107 mEq/L (ref 98–109)
Creatinine: 0.8 mg/dL (ref 0.7–1.3)
Glucose: 98 mg/dl (ref 70–140)
Potassium: 4 mEq/L (ref 3.5–5.1)
Sodium: 142 mEq/L (ref 136–145)
Total Bilirubin: 0.58 mg/dL (ref 0.20–1.20)
Total Protein: 7.9 g/dL (ref 6.4–8.3)

## 2013-06-04 LAB — MAGNESIUM (CC13): Magnesium: 2.2 mg/dL (ref 1.5–2.5)

## 2013-06-04 NOTE — Telephone Encounter (Signed)
Message copied by Jesse Fall on Thu Jun 04, 2013  3:46 PM ------      Message from: Annia Belt      Created: Thu Jun 04, 2013  8:41 AM       Call pt: white count a little lower than previous but in safe range.  Please fax copy to [facility] BMT  Dr Alvie Heidelberg ------

## 2013-06-04 NOTE — Telephone Encounter (Signed)
Pt & wife notified of lab results per Dr Beryle Beams.  They did have a copy of results.  Labs routed to Dr Samule Ohm per Dr Azucena Freed request.

## 2013-06-08 ENCOUNTER — Telehealth: Payer: Self-pay | Admitting: Oncology

## 2013-06-08 NOTE — Telephone Encounter (Signed)
s.w. pt wife and shewanted to cx lab and not r/s...done

## 2013-06-11 ENCOUNTER — Other Ambulatory Visit: Payer: BC Managed Care – PPO

## 2013-06-18 ENCOUNTER — Other Ambulatory Visit: Payer: BC Managed Care – PPO

## 2013-06-19 ENCOUNTER — Encounter: Payer: Self-pay | Admitting: Oncology

## 2013-06-19 ENCOUNTER — Ambulatory Visit (HOSPITAL_BASED_OUTPATIENT_CLINIC_OR_DEPARTMENT_OTHER): Payer: BC Managed Care – PPO | Admitting: Oncology

## 2013-06-19 ENCOUNTER — Telehealth: Payer: Self-pay | Admitting: Oncology

## 2013-06-19 ENCOUNTER — Other Ambulatory Visit (HOSPITAL_BASED_OUTPATIENT_CLINIC_OR_DEPARTMENT_OTHER): Payer: BC Managed Care – PPO

## 2013-06-19 VITALS — BP 123/77 | HR 69 | Temp 97.9°F | Resp 19 | Ht 65.0 in | Wt 172.7 lb

## 2013-06-19 DIAGNOSIS — C9001 Multiple myeloma in remission: Secondary | ICD-10-CM | POA: Insufficient documentation

## 2013-06-19 DIAGNOSIS — I2510 Atherosclerotic heart disease of native coronary artery without angina pectoris: Secondary | ICD-10-CM

## 2013-06-19 DIAGNOSIS — C9000 Multiple myeloma not having achieved remission: Secondary | ICD-10-CM

## 2013-06-19 DIAGNOSIS — I059 Rheumatic mitral valve disease, unspecified: Secondary | ICD-10-CM

## 2013-06-19 DIAGNOSIS — E785 Hyperlipidemia, unspecified: Secondary | ICD-10-CM

## 2013-06-19 LAB — COMPREHENSIVE METABOLIC PANEL (CC13)
ALT: 31 U/L (ref 0–55)
AST: 21 U/L (ref 5–34)
Albumin: 4.2 g/dL (ref 3.5–5.0)
Alkaline Phosphatase: 38 U/L — ABNORMAL LOW (ref 40–150)
Anion Gap: 9 mEq/L (ref 3–11)
BUN: 14.9 mg/dL (ref 7.0–26.0)
CO2: 27 mEq/L (ref 22–29)
Calcium: 9.4 mg/dL (ref 8.4–10.4)
Chloride: 105 mEq/L (ref 98–109)
Creatinine: 0.9 mg/dL (ref 0.7–1.3)
Glucose: 102 mg/dl (ref 70–140)
Potassium: 4.3 mEq/L (ref 3.5–5.1)
Sodium: 142 mEq/L (ref 136–145)
Total Bilirubin: 0.77 mg/dL (ref 0.20–1.20)
Total Protein: 8.5 g/dL — ABNORMAL HIGH (ref 6.4–8.3)

## 2013-06-19 LAB — MAGNESIUM: Magnesium: 2.2 mg/dL (ref 1.5–2.5)

## 2013-06-19 LAB — CBC WITH DIFFERENTIAL/PLATELET
BASO%: 0.5 % (ref 0.0–2.0)
Basophils Absolute: 0 10*3/uL (ref 0.0–0.1)
EOS%: 2.3 % (ref 0.0–7.0)
Eosinophils Absolute: 0.1 10*3/uL (ref 0.0–0.5)
HCT: 36.7 % — ABNORMAL LOW (ref 38.4–49.9)
HGB: 12.4 g/dL — ABNORMAL LOW (ref 13.0–17.1)
LYMPH%: 29.7 % (ref 14.0–49.0)
MCH: 33.1 pg (ref 27.2–33.4)
MCHC: 33.9 g/dL (ref 32.0–36.0)
MCV: 97.4 fL (ref 79.3–98.0)
MONO#: 0.4 10*3/uL (ref 0.1–0.9)
MONO%: 12.2 % (ref 0.0–14.0)
NEUT#: 1.8 10*3/uL (ref 1.5–6.5)
NEUT%: 55.3 % (ref 39.0–75.0)
Platelets: 145 10*3/uL (ref 140–400)
RBC: 3.77 10*6/uL — ABNORMAL LOW (ref 4.20–5.82)
RDW: 14.3 % (ref 11.0–14.6)
WBC: 3.2 10*3/uL — ABNORMAL LOW (ref 4.0–10.3)
lymph#: 1 10*3/uL (ref 0.9–3.3)

## 2013-06-19 NOTE — Telephone Encounter (Signed)
Called pt,left message regarding lab every two weeks until Md visit

## 2013-06-19 NOTE — Progress Notes (Signed)
Hematology and Oncology Follow Up Visit  [name] [id number] 08/01/1960 53 y.o. 06/19/2013 7:09 PM   Principle Diagnosis: Encounter Diagnosis  Name Primary?  . Multiple myeloma in remission Yes     Interim History:   Followup visit for this 53 year old man with IgG kappa multiple myeloma initially diagnosed in May of this year. He achieved an excellent partial response with induction RVD given between May 30 and 03/13/2013. Prior to transplant, total IgG down from 4 g as diagnosis on 4/16 to 1.5 grams by September 12. Free kappa light chains down from 27.9 mg percent to 5.44 by October 3. Bone marrow with 22% plasma cells on September 17 not much change from pre-treatment value of 25%.  He just successfully completed consolidation chemotherapy with IV melphalan 200 mg per meter squared on 04/16/2013 with autologous stem cell support on 04/17/2013 given at [facility]. Overall he did extremely well. He stayed in a local hotel through his nadir. He did develop fever and neutropenia. Blood cultures grew Pseudomonas on November 29.Marland Kitchen He received outpatient IV antibiotics. His central catheter was removed with almost immediate improvement in his clinical status. Followup cultures on December 1 were negative.  He was put on antiviral prophylaxis with acyclovir and antifungal prophylaxis with fluconazole. Fluconazole course now completed. He will continue acyclovir for one year post transplant.  He experienced significant nausea and lost his taste for food until his blood counts recovered. He is now 2 months from transplant and feeling much better. Activity level has increased. Appetite has returned. His wife thinks he is getting frisky again. He has had some fluctuating mild leukopenia but differential count continues to improve. Total white count today 3200 with 55% neutrophils, 30% lymphocytes, 12% monocytes. Hemoglobin is 12.4 compare with 10.3 back in December platelet count  145,000 decreased from previous value of 157,000 on January 8 and 190,000 on December 31. He has had no interim medical problems. No infections.    Medications: reviewed  Allergies:  Allergies  Allergen Reactions  . Toprol Xl [Metoprolol Tartrate]     Intolerance-  Made him tired    Review of Systems: Hematology:  No bleeding or bruising ENT ROS: No sore throat Breast ROS:  Respiratory ROS: No cough or dyspnea  Cardiovascular ROS:  No chest pain or palpitations Gastrointestinal ROS:  No abdominal pain or change in bowel habit  Genito-Urinary ROS: Not questioned Musculoskeletal ROS: No muscle bone or joint pain Neurological ROS: No headache or change in vision Dermatological ROS: No rash Remaining ROS negative:   Physical Exam: Blood pressure 123/77, pulse 69, temperature 97.9 F (36.6 C), temperature source Oral, resp. rate 19, height 5' 5"  (1.651 m), weight 172 lb 11.2 oz (78.336 kg). Wt Readings from Last 3 Encounters:  06/19/13 172 lb 11.2 oz (78.336 kg)  05/14/13 171 lb 4.8 oz (77.701 kg)  03/23/13 175 lb 2 oz (79.436 kg)     General appearance: Well-nourished Caucasian man HENNT: Hair starting to grow back. Pharynx no erythema, exudate, mass, or ulcer. No thyromegaly or thyroid nodules Lymph nodes: No cervical, supraclavicular, or axillary lymphadenopathy Breasts: t Lungs: Clear to auscultation, resonant to percussion throughout Heart: Regular rhythm, 3/6 mitral regurgitation murmur at the apex and left sternal border. 2/6 aortic stenosis murmur at second right intercostal space. , no gallop, no rub, no click, no edema Abdomen: Soft, nontender, normal bowel sounds, no mass, no organomegaly Extremities: No edema, no calf tenderness Musculoskeletal: no joint deformities GU:  Vascular: Carotid  pulses 2+, no bruits,  Neurologic: Alert, oriented, PERRLA, cranial nerves grossly normal, motor strength 5 over 5, reflexes 1+ symmetric, upper body coordination normal, gait  normal, sensation intact to vibration over the fingertips by tuning fork exam Skin: No rash or ecchymosis  Lab Results: CBC W/Diff    Component Value Date/Time   WBC 3.2* 06/19/2013 1406   WBC 2.8* 02/11/2013 0915   RBC 3.77* 06/19/2013 1406   RBC 4.14* 02/11/2013 0915   HGB 12.4* 06/19/2013 1406   HGB 13.3 02/11/2013 0915   HCT 36.7* 06/19/2013 1406   HCT 38.5* 02/11/2013 0915   PLT 145 06/19/2013 1406   PLT 157 02/11/2013 0915   MCV 97.4 06/19/2013 1406   MCV 93.0 02/11/2013 0915   MCH 33.1 06/19/2013 1406   MCH 32.1 02/11/2013 0915   MCHC 33.9 06/19/2013 1406   MCHC 34.5 02/11/2013 0915   RDW 14.3 06/19/2013 1406   RDW 13.9 02/11/2013 0915   LYMPHSABS 1.0 06/19/2013 1406   LYMPHSABS 0.4* 12/23/2012 2211   MONOABS 0.4 06/19/2013 1406   MONOABS 0.3 12/23/2012 2211   EOSABS 0.1 06/19/2013 1406   EOSABS 0.0 12/23/2012 2211   BASOSABS 0.0 06/19/2013 1406   BASOSABS 0.0 12/23/2012 2211     Chemistry      Component Value Date/Time   NA 142 06/19/2013 1407   NA 134* 12/23/2012 2211   K 4.3 06/19/2013 1407   K 3.9 12/23/2012 2211   CL 98 12/23/2012 2211   CL 103 10/31/2012 1116   CO2 27 06/19/2013 1407   CO2 26 12/23/2012 2211   BUN 14.9 06/19/2013 1407   BUN 8 12/23/2012 2211   CREATININE 0.9 06/19/2013 1407   CREATININE 0.85 12/23/2012 2211   CREATININE 0.89 10/11/2010 0949      Component Value Date/Time   CALCIUM 9.4 06/19/2013 1407   CALCIUM 8.7 12/23/2012 2211   ALKPHOS 38* 06/19/2013 1407   ALKPHOS 31* 12/23/2012 2211   AST 21 06/19/2013 1407   AST 14 12/23/2012 2211   ALT 31 06/19/2013 1407   ALT 16 12/23/2012 2211   BILITOT 0.77 06/19/2013 1407   BILITOT 1.1 12/23/2012 2211       Impression:  #1. IgG kappa multiple myeloma  He remains stable now 2 months post high dose IV melphalan with autologous stem cell support.  He will see the [facility] team next week. He will likely get put on Revlimid maintenance. He was told he could decrease blood counts to every other week at this time.  Continue  acyclovir prophylaxis.  Radiate all blood products going into the future.   #2. Recent pseudomonas sepsis successfully treated   #3. Coronary artery disease status post 5 vessel bypass graft in 2005.   #4. Aortic Valvular heart disease   #5. Hyperlipidemia   #6. Mitral valve prolapse. With mitral regurgitation murmur.    CC: Patient Care Team: Denita Lung, MD as PCP - General (Family Medicine) Annia Belt, MD as Consulting Physician (Oncology) Leonie Man, MD as Consulting Physician (Cardiology) Jeanann Lewandowsky as Consulting Physician (Internal Medicine)   Annia Belt, MD 1/23/20157:09 PM

## 2013-06-19 NOTE — Telephone Encounter (Signed)
gv and printed aptp sched and avs for pt for Marcha nd May

## 2013-06-23 LAB — KAPPA/LAMBDA LIGHT CHAINS
Kappa free light chain: 15.2 mg/dL — ABNORMAL HIGH (ref 0.33–1.94)
Kappa:Lambda Ratio: 63.33 — ABNORMAL HIGH (ref 0.26–1.65)
Lambda Free Lght Chn: 0.24 mg/dL — ABNORMAL LOW (ref 0.57–2.63)

## 2013-06-23 LAB — IMMUNOFIXATION ELECTROPHORESIS
IgA: 13 mg/dL — ABNORMAL LOW (ref 68–379)
IgG (Immunoglobin G), Serum: 2660 mg/dL — ABNORMAL HIGH (ref 650–1600)
IgM, Serum: 10 mg/dL — ABNORMAL LOW (ref 41–251)
Total Protein, Serum Electrophoresis: 8.4 g/dL — ABNORMAL HIGH (ref 6.0–8.3)

## 2013-06-23 LAB — BETA 2 MICROGLOBULIN, SERUM: Beta-2 Microglobulin: 1.7 mg/L (ref 1.01–1.73)

## 2013-06-26 ENCOUNTER — Other Ambulatory Visit (HOSPITAL_BASED_OUTPATIENT_CLINIC_OR_DEPARTMENT_OTHER): Payer: BC Managed Care – PPO

## 2013-06-26 DIAGNOSIS — C9001 Multiple myeloma in remission: Secondary | ICD-10-CM

## 2013-06-26 LAB — CBC WITH DIFFERENTIAL/PLATELET
BASO%: 0.9 % (ref 0.0–2.0)
Basophils Absolute: 0 10*3/uL (ref 0.0–0.1)
EOS%: 3.3 % (ref 0.0–7.0)
Eosinophils Absolute: 0.1 10*3/uL (ref 0.0–0.5)
HCT: 36.7 % — ABNORMAL LOW (ref 38.4–49.9)
HGB: 12.5 g/dL — ABNORMAL LOW (ref 13.0–17.1)
LYMPH%: 37 % (ref 14.0–49.0)
MCH: 33.2 pg (ref 27.2–33.4)
MCHC: 34 g/dL (ref 32.0–36.0)
MCV: 97.5 fL (ref 79.3–98.0)
MONO#: 0.4 10*3/uL (ref 0.1–0.9)
MONO%: 15.4 % — ABNORMAL HIGH (ref 0.0–14.0)
NEUT#: 1.3 10*3/uL — ABNORMAL LOW (ref 1.5–6.5)
NEUT%: 43.4 % (ref 39.0–75.0)
Platelets: 144 10*3/uL (ref 140–400)
RBC: 3.76 10*6/uL — ABNORMAL LOW (ref 4.20–5.82)
RDW: 14.1 % (ref 11.0–14.6)
WBC: 2.9 10*3/uL — ABNORMAL LOW (ref 4.0–10.3)
lymph#: 1.1 10*3/uL (ref 0.9–3.3)

## 2013-07-03 ENCOUNTER — Telehealth: Payer: Self-pay | Admitting: Oncology

## 2013-07-03 NOTE — Telephone Encounter (Signed)
Per pt request, pt said he will r/s later after treatment in other facilty is finished, nurse notified

## 2013-07-10 ENCOUNTER — Other Ambulatory Visit: Payer: BC Managed Care – PPO

## 2013-07-21 ENCOUNTER — Encounter: Payer: Self-pay | Admitting: *Deleted

## 2013-07-21 NOTE — Progress Notes (Signed)
RECEIVED A FAX FROM [facility] CONCERNING A FACSIMILE RECEIPT FOR PT. REFERRAL. 

## 2013-07-24 ENCOUNTER — Other Ambulatory Visit: Payer: BC Managed Care – PPO

## 2013-07-25 ENCOUNTER — Encounter: Payer: Self-pay | Admitting: Oncology

## 2013-07-27 ENCOUNTER — Encounter: Payer: Self-pay | Admitting: Oncology

## 2013-07-31 ENCOUNTER — Other Ambulatory Visit: Payer: BC Managed Care – PPO

## 2013-07-31 ENCOUNTER — Ambulatory Visit: Payer: BC Managed Care – PPO | Admitting: Nurse Practitioner

## 2013-08-01 ENCOUNTER — Other Ambulatory Visit: Payer: Self-pay | Admitting: Oncology

## 2013-08-17 ENCOUNTER — Other Ambulatory Visit: Payer: Self-pay | Admitting: Oncology

## 2013-09-02 ENCOUNTER — Telehealth: Payer: Self-pay | Admitting: *Deleted

## 2013-09-02 ENCOUNTER — Other Ambulatory Visit: Payer: Self-pay | Admitting: Cardiology

## 2013-09-02 NOTE — Telephone Encounter (Signed)
Rx was sent to pharmacy electronically. 

## 2013-09-02 NOTE — Telephone Encounter (Signed)
thanks

## 2013-09-02 NOTE — Telephone Encounter (Signed)
Message copied by Cathlean Cower on Wed Sep 02, 2013  1:22 PM ------      Message from: [facility], [place]      Created: Tue Sep 01, 2013 10:10 PM      Regarding: Does patient wants to see me?       Patient of Dr. Darnell Level, relapsed myeloma treated at [facility], on treatment now. Does he wants to come back? ------

## 2013-09-02 NOTE — Telephone Encounter (Signed)
Wife repots pt currently being treated at [facility].  They plan on doing a donor transplant after he completes "4 rounds" of Velcade and Pomalyst.   Wife says pt plans on following up with Dr. Alvy Bimler after his transplant is completed and Dr. Alvie Heidelberg releases him.  They will call for appt when needed.

## 2013-10-01 ENCOUNTER — Ambulatory Visit: Payer: BC Managed Care – PPO | Admitting: Hematology and Oncology

## 2013-10-01 ENCOUNTER — Other Ambulatory Visit: Payer: BC Managed Care – PPO

## 2013-12-02 ENCOUNTER — Telehealth: Payer: Self-pay | Admitting: Internal Medicine

## 2013-12-02 ENCOUNTER — Other Ambulatory Visit: Payer: Self-pay | Admitting: Medical

## 2013-12-02 MED ORDER — SILDENAFIL CITRATE 100 MG PO TABS: 100.0000 mg | ORAL_TABLET | ORAL | Status: DC | PRN

## 2013-12-02 NOTE — Telephone Encounter (Signed)
Pt was getting his viagra 100mg  in [place] cause it was cheaper but now wants it to be sent to [facility] [address]. Pt is going out of town tomorrow and needs his med. Send to [facility] on [address]. Call pt and advise @ [phone]

## 2013-12-02 NOTE — Telephone Encounter (Signed)
rx sent

## 2013-12-03 NOTE — Telephone Encounter (Signed)
Pt.notified

## 2014-04-01 ENCOUNTER — Encounter: Payer: Self-pay | Admitting: Cardiology

## 2014-04-01 ENCOUNTER — Ambulatory Visit (INDEPENDENT_AMBULATORY_CARE_PROVIDER_SITE_OTHER): Payer: BC Managed Care – PPO | Admitting: Cardiology

## 2014-04-01 VITALS — BP 122/78 | HR 62 | Ht 65.5 in | Wt 181.7 lb

## 2014-04-01 DIAGNOSIS — E785 Hyperlipidemia, unspecified: Secondary | ICD-10-CM

## 2014-04-01 DIAGNOSIS — I340 Nonrheumatic mitral (valve) insufficiency: Secondary | ICD-10-CM

## 2014-04-01 DIAGNOSIS — Z951 Presence of aortocoronary bypass graft: Secondary | ICD-10-CM

## 2014-04-01 DIAGNOSIS — I351 Nonrheumatic aortic (valve) insufficiency: Secondary | ICD-10-CM

## 2014-04-01 DIAGNOSIS — I2510 Atherosclerotic heart disease of native coronary artery without angina pectoris: Secondary | ICD-10-CM

## 2014-04-01 DIAGNOSIS — I341 Nonrheumatic mitral (valve) prolapse: Secondary | ICD-10-CM

## 2014-04-01 DIAGNOSIS — Z8679 Personal history of other diseases of the circulatory system: Secondary | ICD-10-CM

## 2014-04-01 MED ORDER — EZETIMIBE-SIMVASTATIN 10-20 MG PO TABS: 1.0000 | ORAL_TABLET | Freq: Every day | ORAL | Status: DC

## 2014-04-01 MED ORDER — CLOPIDOGREL BISULFATE 75 MG PO TABS: 75.0000 mg | ORAL_TABLET | Freq: Every day | ORAL | Status: DC

## 2014-04-01 NOTE — Patient Instructions (Addendum)
  No change in medications    Your physician wants you to follow-up in 2 months Dr Ellyn Hack. 30 mins appt.  You will receive a reminder letter in the mail two months in advance. If you don't receive a letter, please call our office to schedule the follow-up appointment.

## 2014-04-03 ENCOUNTER — Encounter: Payer: Self-pay | Admitting: Cardiology

## 2014-04-03 DIAGNOSIS — E785 Hyperlipidemia, unspecified: Secondary | ICD-10-CM | POA: Insufficient documentation

## 2014-04-03 DIAGNOSIS — I351 Nonrheumatic aortic (valve) insufficiency: Secondary | ICD-10-CM | POA: Insufficient documentation

## 2014-04-03 MED ORDER — EZETIMIBE-SIMVASTATIN 10-20 MG PO TABS: 1.0000 | ORAL_TABLET | Freq: Every day | ORAL | Status: DC

## 2014-04-03 MED ORDER — CLOPIDOGREL BISULFATE 75 MG PO TABS: 75.0000 mg | ORAL_TABLET | Freq: Every day | ORAL | Status: DC

## 2014-04-03 NOTE — Progress Notes (Signed)
PCP: Wyatt Haste, MD  Clinic Note: Chief Complaint  Patient presents with  . 14 months visit    -no chest pain ,  no sob , no edema- stemcell did no t work - on chemo - awaitng a trial- stopped lisinopril low blood pressure   HPI: [name] is a 53 y.o. male with a PMH below who presents today for one-year followup, he was actually supposed to see me back once earlier. Unfortunately his followups for delayed because he's been having treatments at [facility] for his multiple myeloma. 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  He is actually scheduled to have an echocardiogram done at [facility] as part of his pre-treatment evaluation. Apparently in the spring time he had a line infection from the PICC line required antibiotics. I am hoping that he did not have positive blood cultures, because he would need prophylactic long-term antibiotics to his the severe mitral prolapse / Mod MR and aortic regurgitation.  He also had a negative Myoview stress test done.  When I saw him last, we ordered an echocardiogram on that somehow he did not get the results of the back to see him because he was undergoing his bone marrow transplant at that time. The results showed that his mitral regurgitation continues to be stable a moderate, however he prolapse is noticed her to be severe.  Past Medical History  Diagnosis Date  . CAD in native artery     s/p CABG; -- Cards: Dr. Roni Bread, [facility]; [facility] 01/2013 EF 57%, No ischemia or Infarction.  . Mitral valve anterior leaflet prolapse Oct 2014    Echo - Severe anterior prolapse  . Moderate mitral regurgitation by prior echocardiogram October 2014    Echo 02/2013: Severe holosystolic Ant MV Leaflet prolapse, Moderate regurgitation.  Mildly dilated LV with normal systolic function (EF 41-96%), moderate LA dilation, upper  normal PA pressures ~ 34 mmHg.  Marland Kitchen Aortic valve insufficiency, senile calcific October 14    Moderate regurgitation, eccentric towards Ant MV Leaflet  . Essential hypertension   . Hyperlipidemia LDL goal <70   . H/O cardiovascular stress test 02/22/10    normal perfusion, no ischemia or infarct; treadmill and Myoview perfusion study; Dr. Elisabeth Cara  . Pneumococcal vaccine refused 08/15/2012  . Monoclonal gammopathy 09/01/2012    Increased total protein, increased beta globulin peak with decreased gammaglobulin peak on SPEP 08/18/12  IFE not done "possible faint abnormal band"  . Bence Jones proteinuria 09/01/2012    Borderline increase protein 190 mg on 24 hr urine IFE with free kappa light chains (lab normal 50-100 mg)  08/18/12  . Normochromic normocytic anemia 09/01/2012    Hb 12.7, MCV 95  08/18/12 was 13.7 1 year ago; concomitant leukopenia  & increased lymphs  . Leukopenia 09/01/2012    WBC 3,700 42 poly, 48 lymphs, 9 monos 08/18/12  Was 3,700 1 year ago  . Multiple myeloma, without mention of having achieved remission 10/14/2012    Stage I Durie-Salmon; good prognosis international scoring system: see 10/13/12 progress note --> ? in Remission as of 06/19/2013; ? Failed Auto BMT; Continues to be on Chemo  with planned Donor BMT  . Erectile dysfunction   . Anxiety    Interval History: scaphoid heshe has been doing pretty well. He is tired and worn out because of his chemotherapy. He has not had anginal  chest pressure. Denies any PND orthopnea or edema. He does get short of breath with walking but not that much. He feels weak, but has not had syncope or near syncope, TIA amaurosis fugax or. It was felt like that was when he had the infection. He has had some hypotension, so he stopped his ACE inhibitor. Unfortunately, because of his undergoing continued treatment, he is not able to be active exercising.  ROS: A comprehensive was performed. Review of Systems  Constitutional: Positive for weight loss and  malaise/fatigue. Negative for fever and chills.  Respiratory: Negative for cough, shortness of breath and wheezing.   Cardiovascular: Negative.  Negative for claudication.       Per HPI  Gastrointestinal: Negative for blood in stool and melena.  Genitourinary: Negative for hematuria.  Musculoskeletal: Negative for falls.  Neurological: Positive for dizziness and weakness. Negative for sensory change, speech change, focal weakness, seizures and loss of consciousness.  Endo/Heme/Allergies: Bruises/bleeds easily.  All other systems reviewed and are negative.   Current Outpatient Prescriptions on File Prior to Visit  Medication Sig Dispense Refill  . acyclovir (ZOVIRAX) 400 MG tablet TAKE ONE TABLET BY MOUTH TWICE DAILY 60 tablet 0  . albuterol (PROVENTIL HFA;VENTOLIN HFA) 108 (90 BASE) MCG/ACT inhaler Inhale 2 puffs into the lungs every 4 (four) hours as needed for wheezing or shortness of breath (or cough). 1 Inhaler 0  . aspirin 81 MG tablet Take 81 mg by mouth daily.      . Azelastine HCl (ASTEPRO) 0.15 % SOLN Place 1-2 sprays into the nose 2 (two) times daily. 30 mL 2  . DiphenhydrAMINE HCl (BENADRYL PO) Take by mouth as needed.    . docusate sodium (COLACE) 100 MG capsule Take 200 mg by mouth 2 (two) times daily.    . fish oil-omega-3 fatty acids 1000 MG capsule Take 2 g by mouth daily.      . folic acid (FOLVITE) 195 MCG tablet Take 800 mcg by mouth daily.     Marland Kitchen ibuprofen (ADVIL,MOTRIN) 200 MG tablet Take 400 mg by mouth every 6 (six) hours as needed for pain or fever.    . Loratadine (CLARITIN PO) Take by mouth daily.    . mometasone (NASONEX) 50 MCG/ACT nasal spray Place 2 sprays into the nose as needed.    . polyethylene glycol (MIRALAX / GLYCOLAX) packet Take 17 g by mouth daily.    . sildenafil (VIAGRA) 100 MG tablet Take 1 tablet (100 mg total) by mouth as needed for erectile dysfunction. 30 tablet 0  . vardenafil (LEVITRA) 20 MG tablet Take 1 tablet (20 mg total) by mouth daily  as needed for erectile dysfunction. 10 tablet 5   No current facility-administered medications on file prior to visit.   ALLERGIES REVIEWED IN EPIC -- no change SOCIAL AND FAMILY HISTORY REVIEWED IN EPIC -- no change  Wt Readings from Last 3 Encounters:  04/01/14 181 lb 11.2 oz (82.419 kg)  06/19/13 172 lb 11.2 oz (78.336 kg)  05/14/13 171 lb 4.8 oz (77.701 kg)   PHYSICAL EXAM BP 122/78 mmHg  Pulse 62  Ht 5' 5.5" (1.664 m)  Wt 181 lb 11.2 oz (82.419 kg)  BMI 29.77 kg/m2 General appearance: A&O x 3; appears stated age, no distress and Well-nourished and well-groomed. Mild truncal obesity. Answers questions appropriate. Otherwise healthy-appearing Neck: no adenopathy, no carotid bruit, no JVD, Lungs: CTAB, normal percussion bilaterally and Nonlabored, good air movement Heart: normal apical impulse, RRR;  S1: decreased intensity, S2: normal  intensity, no S3 or S4; 3/6 blowing HSM @ apex --> axilla, 2/6 c-d SEM @ RUSB--> carotids; 1/6 DM @ LUSB.  No R/G. Abdomen: soft, non-tender; bowel sounds normal; no masses, no organomegaly Extremities: extremities normal, atraumatic, no cyanosis or edema, no edema, redness or tenderness in the calves or thighs and no ulcers, gangrene or trophic changes Pulses: 2+ and symmetric Neurologic: Alert and oriented X 3, normal strength and tone. Normal symmetric reflexes. Normal coordination and gait HEENT: [place]/AT, EOMI, MMM, anicteric sclera   Adult ECG Report  Rate: 62 ;  Rhythm: normal sinus rhythm, ~ 1AVB  Narrative Interpretation: stable EKG  Recent Labs:  None available from here, labs done at [place] / PLAN: Hx of valvular heart disease - MVP with midl-mod MR, Mild AI with Aortic Sclerosis He will be due for new echocardiogram. This is being done as part of his evaluation to date. I'll wait to see what their results show. Hopefully his mitral regurgitation has not gotten worse. His left ventricular end-systolic diameter stool is below  threshold from the RN repair. He does have mildly dilated diastolic left ventricle diameter. He also has a preserved ejection fraction.  At this current juncture, he probably is not the greatest of surgical candidates with his ongoing chemotherapy and treatment for his multiple myeloma. He needs to be more recovered from that course of therapy prior to considering referral to CT Surgery for evaluation.  We will need to see the results of his  Upcoming echocardiogram. With mitral and aortic valve abnormalities, he is at high risk for endocarditis. Therefore needs prophylactic antibiotics. If he were to have a line infection he needs a TEE, especially in light of his ongoing chemotherapy and reduced immune system.  Aortic valve insufficiency, senile calcific Due to be evaluated by echocardiogram at [facility]  Moderate mitral regurgitation by prior echocardiogram Due for Echo @ [place] -- see above   Mitral valve anterior leaflet prolapse See Above   CAD in native artery -- s/p CABG No angina.   Negative Myoview last year. On ASA & Plavix aas well as Vytorin. No longer on ACE inhibitor due to hypotension. Was not on beta blocker because it exacerbated his fatigue.  Dyslipidemia, goal LDL below 70 On Vytorin. Monitored by PCP.    Orders Placed This Encounter  Procedures  . EKG 12-Lead    Followup: 2 months   Inika Bellanger W, M.D., M.S. Interventional Cardiologist   Pager # [phone]

## 2014-04-03 NOTE — Assessment & Plan Note (Signed)
Due to be evaluated by echocardiogram at [facility]

## 2014-04-03 NOTE — Assessment & Plan Note (Signed)
On Vytorin. Monitored by PCP.

## 2014-04-03 NOTE — Assessment & Plan Note (Signed)
Due for Echo @ [place] -- see above

## 2014-04-03 NOTE — Assessment & Plan Note (Signed)
He will be due for new echocardiogram. This is being done as part of his evaluation to date. I'll wait to see what their results show. Hopefully his mitral regurgitation has not gotten worse. His left ventricular end-systolic diameter stool is below threshold from the RN repair. He does have mildly dilated diastolic left ventricle diameter. He also has a preserved ejection fraction.  At this current juncture, he probably is not the greatest of surgical candidates with his ongoing chemotherapy and treatment for his multiple myeloma. He needs to be more recovered from that course of therapy prior to considering referral to CT Surgery for evaluation.  We will need to see the results of his  Upcoming echocardiogram. With mitral and aortic valve abnormalities, he is at high risk for endocarditis. Therefore needs prophylactic antibiotics. If he were to have a line infection he needs a TEE, especially in light of his ongoing chemotherapy and reduced immune system.

## 2014-04-03 NOTE — Assessment & Plan Note (Signed)
See Above

## 2014-04-03 NOTE — Assessment & Plan Note (Signed)
No angina.   Negative Myoview last year. On ASA & Plavix aas well as Vytorin. No longer on ACE inhibitor due to hypotension. Was not on beta blocker because it exacerbated his fatigue.

## 2014-04-13 ENCOUNTER — Other Ambulatory Visit: Payer: Self-pay | Admitting: Cardiology

## 2014-04-13 NOTE — Telephone Encounter (Signed)
Rx was sent to pharmacy electronically. 

## 2014-04-27 ENCOUNTER — Telehealth: Payer: Self-pay | Admitting: *Deleted

## 2014-04-27 NOTE — Telephone Encounter (Signed)
ROUTED OFFICE NOTE FROM 04/01/14

## 2014-04-27 NOTE — Telephone Encounter (Signed)
-----   Message from Leonie Man, MD sent at 04/03/2014  5:59 PM EST ----- Ivin Booty - Please as Med Records to forward this Note to Dr. Verlin Dike @ [place].  Thnx.

## 2014-05-30 ENCOUNTER — Other Ambulatory Visit: Payer: Self-pay | Admitting: Medical

## 2014-06-18 ENCOUNTER — Ambulatory Visit: Payer: BC Managed Care – PPO | Admitting: Cardiology

## 2014-06-29 ENCOUNTER — Telehealth: Payer: Self-pay | Admitting: Family Medicine

## 2014-06-29 NOTE — Telephone Encounter (Signed)
Recv'd P.A. Nexium, called pharmacy and they spoke with pt and he wanted to get Dexilant which is covered with his ins.  They had an old Rx and filled it.  But needs current Rx with refills if you will authorize.

## 2014-06-29 NOTE — Telephone Encounter (Signed)
Pt at [facility] waiting stem cell transplant, had to move there for the next 3 months & will call and schedule appt once he's moved back

## 2014-07-08 ENCOUNTER — Other Ambulatory Visit: Payer: Self-pay | Admitting: Pharmacist

## 2014-11-02 ENCOUNTER — Telehealth: Payer: Self-pay | Admitting: *Deleted

## 2014-11-02 NOTE — Telephone Encounter (Signed)
Valli Glance, NP from [facility] called wanting to get patient reestablished at [facility]. Information given to Dr Alvy Bimler to review. Is currently taking amphotericin B inhalation weekly at [facility]- post transplant

## 2014-11-08 ENCOUNTER — Other Ambulatory Visit: Payer: Self-pay | Admitting: Hematology and Oncology

## 2014-11-15 ENCOUNTER — Ambulatory Visit (INDEPENDENT_AMBULATORY_CARE_PROVIDER_SITE_OTHER): Payer: BLUE CROSS/BLUE SHIELD | Admitting: Medical

## 2014-11-15 ENCOUNTER — Encounter: Payer: Self-pay | Admitting: Medical

## 2014-11-15 VITALS — BP 100/70 | HR 78 | Temp 98.3°F | Wt 161.0 lb

## 2014-11-15 DIAGNOSIS — L03039 Cellulitis of unspecified toe: Secondary | ICD-10-CM

## 2014-11-15 MED ORDER — CEPHALEXIN 500 MG PO CAPS: 500.0000 mg | ORAL_CAPSULE | Freq: Two times a day (BID) | ORAL | Status: DC

## 2014-11-15 NOTE — Patient Instructions (Signed)
Currently your toe appears to have a Paronychia infection  Use hot water soaks with soap and epsom salts for 20 minutes twice daily  Use the keflex antibiotic 2 times daily  If worse redness, pain, or swelling in the next few days, then call back or come in  If fever, return right away   Paronychia Paronychia is an inflammatory reaction involving the folds of the skin surrounding the fingernail. This is commonly caused by an infection in the skin around a nail. The most common cause of paronychia is frequent wetting of the hands (as seen with bartenders, food servers, nurses or others who wet their hands). This makes the skin around the fingernail susceptible to infection by bacteria (germs) or fungus. Other predisposing factors are:  Aggressive manicuring.  Nail biting.  Thumb sucking. The most common cause is a staphylococcal (a type of germ) infection, or a fungal (Candida) infection. When caused by a germ, it usually comes on suddenly with redness, swelling, pus and is often painful. It may get under the nail and form an abscess (collection of pus), or form an abscess around the nail. If the nail itself is infected with a fungus, the treatment is usually prolonged and may require oral medicine for up to one year. Your caregiver will determine the length of time treatment is required. The paronychia caused by bacteria (germs) may largely be avoided by not pulling on hangnails or picking at cuticles. When the infection occurs at the tips of the finger it is called felon. When the cause of paronychia is from the herpes simplex virus (HSV) it is called herpetic whitlow. TREATMENT  When an abscess is present treatment is often incision and drainage. This means that the abscess must be cut open so the pus can get out. When this is done, the following home care instructions should be followed. HOME CARE INSTRUCTIONS   It is important to keep the affected fingers very dry. Rubber or plastic  gloves over cotton gloves should be used whenever the hand must be placed in water.  Keep wound clean, dry and dressed as suggested by your caregiver between warm soaks or warm compresses.  Soak in warm water for fifteen to twenty minutes three to four times per day for bacterial infections. Fungal infections are very difficult to treat, so often require treatment for long periods of time.  For bacterial (germ) infections take antibiotics (medicine which kill germs) as directed and finish the prescription, even if the problem appears to be solved before the medicine is gone.  Only take over-the-counter or prescription medicines for pain, discomfort, or fever as directed by your caregiver. SEEK IMMEDIATE MEDICAL CARE IF:  You have redness, swelling, or increasing pain in the wound.  You notice pus coming from the wound.  You have a fever.  You notice a bad smell coming from the wound or dressing. Document Released: 11/07/2000 Document Revised: 08/06/2011 Document Reviewed: 07/09/2008 [facility] Patient Information 2015 [place], [place]. This information is not intended to replace advice given to you by your health care provider. Make sure you discuss any questions you have with your health care provider.

## 2014-11-15 NOTE — Progress Notes (Signed)
Subjective: Here for possible infection of left great toe.  Accompanied by wife.  Started a week ago with mild pain, redness.  Has worsened a little, but just not clearing up.   No recent fever nausea or change in appetite.  He is on several medications, on prograft and study medication for multiple myeloma and had bone marrow transplant recently.  No prior similar, no hx/o ingrown toenail. No other aggravating or relieving factors. No other complaint.  Objective: BP 100/70 mmHg  Pulse 78  Temp(Src) 98.3 F (36.8 C) (Oral)  Wt 161 lb (73.029 kg)  Gen: wd, wn, nad Left great toe with medial edge of nailbed with pink/red coloration, slight sweling tenderness, no drainage.  No obvious ingrown nail.  Assessment: Encounter Diagnosis  Name Primary?  . Paronychia of toe, unspecified laterality Yes    Plan: Begin foot soaks in hot soapy water, keflex BID, good foot care, c/t to cut nails straight across.  If worse redness, swelling or pain recheck.  F/u if not much improved in 4-5 days.

## 2014-12-10 ENCOUNTER — Encounter: Payer: Self-pay | Admitting: Cardiology

## 2015-03-14 ENCOUNTER — Ambulatory Visit (INDEPENDENT_AMBULATORY_CARE_PROVIDER_SITE_OTHER): Payer: BLUE CROSS/BLUE SHIELD | Admitting: Medical

## 2015-03-14 ENCOUNTER — Ambulatory Visit
Admission: RE | Admit: 2015-03-14 | Discharge: 2015-03-14 | Disposition: A | Discharge status: Home / Self Care | Payer: BLUE CROSS/BLUE SHIELD | Source: Ambulatory Visit | Attending: Medical | Admitting: Medical

## 2015-03-14 ENCOUNTER — Encounter: Payer: Self-pay | Admitting: Medical

## 2015-03-14 ENCOUNTER — Telehealth: Payer: Self-pay | Admitting: Medical

## 2015-03-14 VITALS — BP 94/60 | HR 80 | Temp 98.3°F | Wt 165.0 lb

## 2015-03-14 DIAGNOSIS — D849 Immunodeficiency, unspecified: Secondary | ICD-10-CM

## 2015-03-14 DIAGNOSIS — R05 Cough: Secondary | ICD-10-CM | POA: Diagnosis not present

## 2015-03-14 DIAGNOSIS — J988 Other specified respiratory disorders: Secondary | ICD-10-CM

## 2015-03-14 DIAGNOSIS — R059 Cough, unspecified: Secondary | ICD-10-CM

## 2015-03-14 DIAGNOSIS — D899 Disorder involving the immune mechanism, unspecified: Secondary | ICD-10-CM

## 2015-03-14 LAB — CBC WITH DIFFERENTIAL/PLATELET
Basophils Absolute: 0.1 10*3/uL (ref 0.0–0.1)
Basophils Relative: 1 % (ref 0–1)
Eosinophils Absolute: 3.9 10*3/uL — ABNORMAL HIGH (ref 0.0–0.7)
Eosinophils Relative: 39 % — ABNORMAL HIGH (ref 0–5)
HCT: 39 % (ref 39.0–52.0)
Hemoglobin: 12.2 g/dL — ABNORMAL LOW (ref 13.0–17.0)
Lymphocytes Relative: 18 % (ref 12–46)
Lymphs Abs: 1.8 10*3/uL (ref 0.7–4.0)
MCH: 25.9 pg — ABNORMAL LOW (ref 26.0–34.0)
MCHC: 31.3 g/dL (ref 30.0–36.0)
MCV: 82.8 fL (ref 78.0–100.0)
MPV: 10.4 fL (ref 8.6–12.4)
Monocytes Absolute: 1.2 10*3/uL — ABNORMAL HIGH (ref 0.1–1.0)
Monocytes Relative: 12 % (ref 3–12)
Neutro Abs: 3 10*3/uL (ref 1.7–7.7)
Neutrophils Relative %: 30 % — ABNORMAL LOW (ref 43–77)
Platelets: 170 10*3/uL (ref 150–400)
RBC: 4.71 MIL/uL (ref 4.22–5.81)
RDW: 19.3 % — ABNORMAL HIGH (ref 11.5–15.5)
WBC: 10.1 10*3/uL (ref 4.0–10.5)

## 2015-03-14 MED ORDER — HYDROCOD POLST-CPM POLST ER 10-8 MG/5ML PO SUER: 5.0000 mL | Freq: Every evening | ORAL | Status: DC | PRN

## 2015-03-14 MED ORDER — ALBUTEROL SULFATE HFA 108 (90 BASE) MCG/ACT IN AERS: 2.0000 | INHALATION_SPRAY | RESPIRATORY_TRACT | Status: DC | PRN

## 2015-03-14 MED ORDER — AMOXICILLIN-POT CLAVULANATE 875-125 MG PO TABS: 1.0000 | ORAL_TABLET | Freq: Two times a day (BID) | ORAL | Status: DC

## 2015-03-14 NOTE — Telephone Encounter (Signed)
Called pt and advised calling in Rx Augmentin per Glades also spoke with [facility] & coordinated care.   I faxed labs & Xray to [facility] nurse at t# [phone] & called in Rx to [facility]

## 2015-03-14 NOTE — Progress Notes (Signed)
Subjective: Chief Complaint  Patient presents with  . cough    thinks it may be a virus. has a burning throat and coughing up yellow flim. has lowered immune system due to transplant and chemo drugs   Here today for illness, accompanied by his wife.   He has hx/o significant for immunocompromised state, had bone marrow transplant early this year, and also in research drug currently.   Sees oncology and research trial group at [facility].  He notes about 3- 4 weeks ago at [facility] was seen in clinic and had early symptoms of respiratory symptoms then, had swabs taken showing adenovirus+ at the time.   He has continued to have cough, chest congestion, several cough at times, productive sputum, and fatigue for the last 3 weeks now, not seeing much improved.  Denies fever, no ear pain, no sore throat although he has drainage in the throat.  No NVD, no loose stool, no sick contacts.   Used up all the Tussionex he had but this did help.   No other aggravating or relieving factors.  No other c/o.  Past Medical History  Diagnosis Date  . CAD in native artery     s/p CABG; -- Cards: Dr. D. Harding, [facility]; [facility] 01/2013 EF 57%, No ischemia or Infarction.  . Mitral valve anterior leaflet prolapse Oct 2014    Echo - Severe anterior prolapse  . Moderate mitral regurgitation by prior echocardiogram October 2014    Echo 02/2013: Severe holosystolic Ant MV Leaflet prolapse, Moderate regurgitation.  Mildly dilated LV with normal systolic function (EF 55-60%), moderate LA dilation, upper normal PA pressures ~ 34 mmHg.  . Aortic valve insufficiency, senile calcific October 14    Moderate regurgitation, eccentric towards Ant MV Leaflet  . Essential hypertension   . Hyperlipidemia LDL goal <70   . H/O cardiovascular stress test 02/22/10    normal perfusion, no ischemia or infarct; treadmill and Myoview perfusion study; Dr. Solomon  . Pneumococcal vaccine refused 08/15/2012  . Monoclonal gammopathy 09/01/2012     Increased total protein, increased beta globulin peak with decreased gammaglobulin peak on SPEP 08/18/12  IFE not done "possible faint abnormal band"  . Bence Jones proteinuria 09/01/2012    Borderline increase protein 190 mg on 24 hr urine IFE with free kappa light chains (lab normal 50-100 mg)  08/18/12  . Normochromic normocytic anemia 09/01/2012    Hb 12.7, MCV 95  08/18/12 was 13.7 1 year ago; concomitant leukopenia  & increased lymphs  . Leukopenia 09/01/2012    WBC 3,700 42 poly, 48 lymphs, 9 monos 08/18/12  Was 3,700 1 year ago  . Multiple myeloma, without mention of having achieved remission 10/14/2012    Stage I Durie-Salmon; good prognosis international scoring system: see 10/13/12 progress note --> ? in Remission as of 06/19/2013; ? Failed Auto BMT; Continues to be on Chemo  with planned Donor BMT  . Erectile dysfunction   . Anxiety     ROS as in subjective   Objective: BP 94/60 mmHg  Pulse 80  Temp(Src) 98.3 F (36.8 C)  Wt 165 lb (74.844 kg)  Gen: wd, wn nad, lots of coughing today Skin: warm, dry Heent: conjunctiva normal, nares patent, TMs pearly, sinus non tender Neck: supple, nontender, no mass, no thyromegaly, no lymphadenopathy Lungs: somewhat bronchial sounds, but no wheezes, no rhonchi, no rales Heart: 3/6 holosystolic blowing murmur heard best in left upper sternal border, othewrise normal S1, s2 Ext: no edema         Assessment  Encounter Diagnoses  Name Primary?  . Cough Yes  . Respiratory tract infection   . Immunocompromised state (HCC)       Plan: Discussed case with supervising physician Dr. Lalonde.  Will send for CBC and chest xray.    After getting results back later in the day, discussed case again with Dr. Lalonde, ended up calling his oncologist at [facility], Dr. Cristina Gasparetto who then relayed my info to the research team.   They called back and we discussed his case.   We will begin him on Augmentin, refilled his Tussionex, and gave albuterol  inhaler to help with cough and SOB.   They will contact him for f/u, and CBC and CXR results were faxed to Matt, nurse with the research trial.   He is to call back in a few days to give us update on symptoms.   I called and spoke to Mr. Gavilanes by phone this afternoon.   

## 2015-03-15 LAB — PATHOLOGIST SMEAR REVIEW

## 2015-03-15 NOTE — Telephone Encounter (Signed)
Duplicate phone call

## 2015-06-16 ENCOUNTER — Encounter: Payer: Self-pay | Admitting: Medical

## 2015-08-19 ENCOUNTER — Ambulatory Visit (INDEPENDENT_AMBULATORY_CARE_PROVIDER_SITE_OTHER): Payer: BLUE CROSS/BLUE SHIELD | Admitting: Family Medicine

## 2015-08-19 ENCOUNTER — Encounter: Payer: Self-pay | Admitting: Family Medicine

## 2015-08-19 VITALS — BP 122/62 | HR 64 | Temp 98.0°F

## 2015-08-19 DIAGNOSIS — M79671 Pain in right foot: Secondary | ICD-10-CM | POA: Diagnosis not present

## 2015-08-19 NOTE — Patient Instructions (Signed)
Achilles Tendinitis Achilles tendinitis is inflammation of the tough, cord-like band that attaches the lower muscles of your leg to your heel (Achilles tendon). It is usually caused by overusing the tendon and joint involved.  CAUSES Achilles tendinitis can happen because of:  A sudden increase in exercise or activity (such as running).  Doing the same exercises or activities (such as jumping) over and over.  Not warming up calf muscles before exercising.  Exercising in shoes that are worn out or not made for exercise.  Having arthritis or a bone growth on the back of the heel bone. This can rub against the tendon and hurt the tendon. SIGNS AND SYMPTOMS The most common symptoms are:  Pain in the back of the leg, just above the heel. The pain usually gets worse with exercise and better with rest.  Stiffness or soreness in the back of the leg, especially in the morning.  Swelling of the skin over the Achilles tendon.  Trouble standing on tiptoe. Sometimes, an Achilles tendon tears (ruptures). Symptoms of an Achilles tendon rupture can include:  Sudden, severe pain in the back of the leg.  Trouble putting weight on the foot or walking normally. DIAGNOSIS Achilles tendinitis will be diagnosed based on symptoms and a physical examination. An X-ray may be done to check if another condition is causing your symptoms. An MRI may be ordered if your health care provider suspects you may have completely torn your tendon, which is called an Achilles tendon rupture.  TREATMENT  Achilles tendinitis usually gets better over time. It can take weeks to months to heal completely. Treatment focuses on treating the symptoms and helping the injury heal. HOME CARE INSTRUCTIONS   Rest your Achilles tendon and avoid activities that cause pain.  Apply ice to the injured area:  Put ice in a plastic bag.  Place a towel between your skin and the bag.  Leave the ice on for 20 minutes, 2-3 times a  day  Try to avoid using the tendon (other than gentle range of motion) while the tendon is painful. Do not resume use until instructed by your health care provider. Then begin use gradually. Do not increase use to the point of pain. If pain does develop, decrease use and continue the above measures. Gradually increase activities that do not cause discomfort until you achieve normal use.  Do exercises to make your calf muscles stronger and more flexible. Your health care provider or physical therapist can recommend exercises for you to do.  Wrap your ankle with an elastic bandage or other wrap. This can help keep your tendon from moving too much. Your health care provider will show you how to wrap your ankle correctly.  Only take over-the-counter or prescription medicines for pain, discomfort, or fever as directed by your health care provider. SEEK MEDICAL CARE IF:   Your pain and swelling increase or pain is uncontrolled with medicines.  You develop new, unexplained symptoms or your symptoms get worse.  You are unable to move your toes or foot.  You develop warmth and swelling in your foot.  You have an unexplained temperature. MAKE SURE YOU:   Understand these instructions.  Will watch your condition.  Will get help right away if you are not doing well or get worse.   This information is not intended to replace advice given to you by your health care provider. Make sure you discuss any questions you have with your health care provider.   Document Released:   02/21/2005 Document Revised: 06/04/2014 Document Reviewed: 12/24/2012 Elsevier Interactive Patient Education 2016 [employer]  

## 2015-08-19 NOTE — Progress Notes (Signed)
   Subjective:    Patient ID: [name], male    DOB: Sep 07, 1960, 55 y.o.   MRN: [id number]  HPI  Chief Complaint  Patient presents with  . right heel pain    right heel pain since tuesday   He is here with complaints of right heel pain for the past 3-4 days. States he was using his inversion table and he had a sneaker on that heel that rubbed the area and caused pain. Denies feeling pop or sudden snap. He states he can ambulate with a limp. Denies pain at rest. Pain worsens with weightbearing and walking. He has been using ice to the area.  Denies fever, chills, nausea, vomiting, diarrhea.     Review of Systems Pertinent positives and negatives in the history of present illness.     Objective:   Physical Exam BP 122/62 mmHg  Pulse 64  Temp(Src) 98 F (36.7 C) (Oral)  Normal color, sensation and pulse to right foot. Tenderness to right posterior heel without step off, plantar flexion present but weak due to pain, no swelling, warmth or calf tenderness, thompson test negative. No erythema, edema or tenderness to plantar aspect of foot or heel.       Assessment & Plan:  Heel pain, right  Suspect possible Achilles tendinitis vs bursitis. No sign of tendon rupture or inflammation. Discussed taking Tylenol twice daily since he is on Eliquis and NSAIDs are contraindicated. Recommend using ice or heat to the area. Wife requested prescription for crutches for the patient, prescription was provided. Discussed that if he is not noticing improvement that he should return for possible imaging or referral.

## 2015-08-30 DIAGNOSIS — D89813 Graft-versus-host disease, unspecified: Secondary | ICD-10-CM | POA: Diagnosis not present

## 2015-08-30 DIAGNOSIS — L989 Disorder of the skin and subcutaneous tissue, unspecified: Secondary | ICD-10-CM | POA: Diagnosis not present

## 2015-08-30 DIAGNOSIS — T865 Complications of stem cell transplant: Secondary | ICD-10-CM | POA: Diagnosis not present

## 2015-08-30 DIAGNOSIS — C9000 Multiple myeloma not having achieved remission: Secondary | ICD-10-CM | POA: Diagnosis not present

## 2015-08-30 DIAGNOSIS — Y834 Other reconstructive surgery as the cause of abnormal reaction of the patient, or of later complication, without mention of misadventure at the time of the procedure: Secondary | ICD-10-CM | POA: Diagnosis not present

## 2015-09-01 ENCOUNTER — Ambulatory Visit: Payer: BLUE CROSS/BLUE SHIELD | Admitting: Internal Medicine

## 2015-09-01 DIAGNOSIS — D89813 Graft-versus-host disease, unspecified: Secondary | ICD-10-CM | POA: Diagnosis not present

## 2015-09-01 DIAGNOSIS — C9000 Multiple myeloma not having achieved remission: Secondary | ICD-10-CM | POA: Diagnosis not present

## 2015-09-01 DIAGNOSIS — L989 Disorder of the skin and subcutaneous tissue, unspecified: Secondary | ICD-10-CM | POA: Diagnosis not present

## 2015-09-01 DIAGNOSIS — Y830 Surgical operation with transplant of whole organ as the cause of abnormal reaction of the patient, or of later complication, without mention of misadventure at the time of the procedure: Secondary | ICD-10-CM | POA: Diagnosis not present

## 2015-09-01 DIAGNOSIS — T8609 Other complications of bone marrow transplant: Secondary | ICD-10-CM | POA: Diagnosis not present

## 2015-09-02 DIAGNOSIS — D89813 Graft-versus-host disease, unspecified: Secondary | ICD-10-CM | POA: Diagnosis not present

## 2015-09-02 DIAGNOSIS — L989 Disorder of the skin and subcutaneous tissue, unspecified: Secondary | ICD-10-CM | POA: Diagnosis not present

## 2015-09-06 DIAGNOSIS — Y830 Surgical operation with transplant of whole organ as the cause of abnormal reaction of the patient, or of later complication, without mention of misadventure at the time of the procedure: Secondary | ICD-10-CM | POA: Diagnosis not present

## 2015-09-06 DIAGNOSIS — L989 Disorder of the skin and subcutaneous tissue, unspecified: Secondary | ICD-10-CM | POA: Diagnosis not present

## 2015-09-06 DIAGNOSIS — C9000 Multiple myeloma not having achieved remission: Secondary | ICD-10-CM | POA: Diagnosis not present

## 2015-09-06 DIAGNOSIS — T8609 Other complications of bone marrow transplant: Secondary | ICD-10-CM | POA: Diagnosis not present

## 2015-09-06 DIAGNOSIS — D89813 Graft-versus-host disease, unspecified: Secondary | ICD-10-CM | POA: Diagnosis not present

## 2015-09-06 DIAGNOSIS — Z5181 Encounter for therapeutic drug level monitoring: Secondary | ICD-10-CM | POA: Diagnosis not present

## 2015-09-06 DIAGNOSIS — Z79899 Other long term (current) drug therapy: Secondary | ICD-10-CM | POA: Diagnosis not present

## 2015-09-08 DIAGNOSIS — L989 Disorder of the skin and subcutaneous tissue, unspecified: Secondary | ICD-10-CM | POA: Diagnosis not present

## 2015-09-08 DIAGNOSIS — D89813 Graft-versus-host disease, unspecified: Secondary | ICD-10-CM | POA: Diagnosis not present

## 2015-09-08 DIAGNOSIS — Y830 Surgical operation with transplant of whole organ as the cause of abnormal reaction of the patient, or of later complication, without mention of misadventure at the time of the procedure: Secondary | ICD-10-CM | POA: Diagnosis not present

## 2015-09-08 DIAGNOSIS — T8609 Other complications of bone marrow transplant: Secondary | ICD-10-CM | POA: Diagnosis not present

## 2015-09-14 DIAGNOSIS — T865 Complications of stem cell transplant: Secondary | ICD-10-CM | POA: Diagnosis not present

## 2015-09-14 DIAGNOSIS — C9000 Multiple myeloma not having achieved remission: Secondary | ICD-10-CM | POA: Diagnosis not present

## 2015-09-14 DIAGNOSIS — L989 Disorder of the skin and subcutaneous tissue, unspecified: Secondary | ICD-10-CM | POA: Diagnosis not present

## 2015-09-14 DIAGNOSIS — Y830 Surgical operation with transplant of whole organ as the cause of abnormal reaction of the patient, or of later complication, without mention of misadventure at the time of the procedure: Secondary | ICD-10-CM | POA: Diagnosis not present

## 2015-09-14 DIAGNOSIS — D89813 Graft-versus-host disease, unspecified: Secondary | ICD-10-CM | POA: Diagnosis not present

## 2015-09-15 DIAGNOSIS — L989 Disorder of the skin and subcutaneous tissue, unspecified: Secondary | ICD-10-CM | POA: Diagnosis not present

## 2015-09-15 DIAGNOSIS — Z5181 Encounter for therapeutic drug level monitoring: Secondary | ICD-10-CM | POA: Diagnosis not present

## 2015-09-15 DIAGNOSIS — T8609 Other complications of bone marrow transplant: Secondary | ICD-10-CM | POA: Diagnosis not present

## 2015-09-15 DIAGNOSIS — C9000 Multiple myeloma not having achieved remission: Secondary | ICD-10-CM | POA: Diagnosis not present

## 2015-09-15 DIAGNOSIS — D89813 Graft-versus-host disease, unspecified: Secondary | ICD-10-CM | POA: Diagnosis not present

## 2015-09-20 DIAGNOSIS — D89813 Graft-versus-host disease, unspecified: Secondary | ICD-10-CM | POA: Diagnosis not present

## 2015-09-20 DIAGNOSIS — Z9481 Bone marrow transplant status: Secondary | ICD-10-CM | POA: Diagnosis not present

## 2015-09-20 DIAGNOSIS — L989 Disorder of the skin and subcutaneous tissue, unspecified: Secondary | ICD-10-CM | POA: Diagnosis not present

## 2015-09-20 DIAGNOSIS — Z5181 Encounter for therapeutic drug level monitoring: Secondary | ICD-10-CM | POA: Diagnosis not present

## 2015-09-22 DIAGNOSIS — T865 Complications of stem cell transplant: Secondary | ICD-10-CM | POA: Diagnosis not present

## 2015-09-22 DIAGNOSIS — C9001 Multiple myeloma in remission: Secondary | ICD-10-CM | POA: Diagnosis not present

## 2015-09-22 DIAGNOSIS — L989 Disorder of the skin and subcutaneous tissue, unspecified: Secondary | ICD-10-CM | POA: Diagnosis not present

## 2015-09-22 DIAGNOSIS — Z48298 Encounter for aftercare following other organ transplant: Secondary | ICD-10-CM | POA: Diagnosis not present

## 2015-09-22 DIAGNOSIS — C9000 Multiple myeloma not having achieved remission: Secondary | ICD-10-CM | POA: Diagnosis not present

## 2015-09-22 DIAGNOSIS — D89813 Graft-versus-host disease, unspecified: Secondary | ICD-10-CM | POA: Diagnosis not present

## 2015-09-22 DIAGNOSIS — D89811 Chronic graft-versus-host disease: Secondary | ICD-10-CM | POA: Diagnosis not present

## 2015-09-28 DIAGNOSIS — L989 Disorder of the skin and subcutaneous tissue, unspecified: Secondary | ICD-10-CM | POA: Diagnosis not present

## 2015-09-28 DIAGNOSIS — Z9481 Bone marrow transplant status: Secondary | ICD-10-CM | POA: Diagnosis not present

## 2015-09-28 DIAGNOSIS — Z5181 Encounter for therapeutic drug level monitoring: Secondary | ICD-10-CM | POA: Diagnosis not present

## 2015-09-28 DIAGNOSIS — D89813 Graft-versus-host disease, unspecified: Secondary | ICD-10-CM | POA: Diagnosis not present

## 2015-09-28 DIAGNOSIS — C9000 Multiple myeloma not having achieved remission: Secondary | ICD-10-CM | POA: Diagnosis not present

## 2015-09-29 DIAGNOSIS — L989 Disorder of the skin and subcutaneous tissue, unspecified: Secondary | ICD-10-CM | POA: Diagnosis not present

## 2015-09-29 DIAGNOSIS — D89811 Chronic graft-versus-host disease: Secondary | ICD-10-CM | POA: Diagnosis not present

## 2015-09-29 DIAGNOSIS — D89813 Graft-versus-host disease, unspecified: Secondary | ICD-10-CM | POA: Diagnosis not present

## 2015-09-29 DIAGNOSIS — C9000 Multiple myeloma not having achieved remission: Secondary | ICD-10-CM | POA: Diagnosis not present

## 2015-09-29 DIAGNOSIS — T865 Complications of stem cell transplant: Secondary | ICD-10-CM | POA: Diagnosis not present

## 2015-09-29 DIAGNOSIS — L988 Other specified disorders of the skin and subcutaneous tissue: Secondary | ICD-10-CM | POA: Diagnosis not present

## 2015-10-05 DIAGNOSIS — D89813 Graft-versus-host disease, unspecified: Secondary | ICD-10-CM | POA: Diagnosis not present

## 2015-10-05 DIAGNOSIS — C9000 Multiple myeloma not having achieved remission: Secondary | ICD-10-CM | POA: Diagnosis not present

## 2015-10-05 DIAGNOSIS — Y834 Other reconstructive surgery as the cause of abnormal reaction of the patient, or of later complication, without mention of misadventure at the time of the procedure: Secondary | ICD-10-CM | POA: Diagnosis not present

## 2015-10-05 DIAGNOSIS — L989 Disorder of the skin and subcutaneous tissue, unspecified: Secondary | ICD-10-CM | POA: Diagnosis not present

## 2015-10-05 DIAGNOSIS — T865 Complications of stem cell transplant: Secondary | ICD-10-CM | POA: Diagnosis not present

## 2015-10-06 DIAGNOSIS — D89813 Graft-versus-host disease, unspecified: Secondary | ICD-10-CM | POA: Diagnosis not present

## 2015-10-06 DIAGNOSIS — T865 Complications of stem cell transplant: Secondary | ICD-10-CM | POA: Diagnosis not present

## 2015-10-06 DIAGNOSIS — Y834 Other reconstructive surgery as the cause of abnormal reaction of the patient, or of later complication, without mention of misadventure at the time of the procedure: Secondary | ICD-10-CM | POA: Diagnosis not present

## 2015-10-06 DIAGNOSIS — H16223 Keratoconjunctivitis sicca, not specified as Sjogren's, bilateral: Secondary | ICD-10-CM | POA: Diagnosis not present

## 2015-10-06 DIAGNOSIS — T495X5A Adverse effect of ophthalmological drugs and preparations, initial encounter: Secondary | ICD-10-CM | POA: Diagnosis not present

## 2015-10-06 DIAGNOSIS — Z5181 Encounter for therapeutic drug level monitoring: Secondary | ICD-10-CM | POA: Diagnosis not present

## 2015-10-06 DIAGNOSIS — D89811 Chronic graft-versus-host disease: Secondary | ICD-10-CM | POA: Diagnosis not present

## 2015-10-06 DIAGNOSIS — Z79899 Other long term (current) drug therapy: Secondary | ICD-10-CM | POA: Diagnosis not present

## 2015-10-06 DIAGNOSIS — L989 Disorder of the skin and subcutaneous tissue, unspecified: Secondary | ICD-10-CM | POA: Diagnosis not present

## 2015-10-18 DIAGNOSIS — T865 Complications of stem cell transplant: Secondary | ICD-10-CM | POA: Diagnosis not present

## 2015-10-18 DIAGNOSIS — D89813 Graft-versus-host disease, unspecified: Secondary | ICD-10-CM | POA: Diagnosis not present

## 2015-10-18 DIAGNOSIS — Z7901 Long term (current) use of anticoagulants: Secondary | ICD-10-CM | POA: Diagnosis not present

## 2015-10-18 DIAGNOSIS — L989 Disorder of the skin and subcutaneous tissue, unspecified: Secondary | ICD-10-CM | POA: Diagnosis not present

## 2015-10-18 DIAGNOSIS — C9000 Multiple myeloma not having achieved remission: Secondary | ICD-10-CM | POA: Diagnosis not present

## 2015-10-20 DIAGNOSIS — T865 Complications of stem cell transplant: Secondary | ICD-10-CM | POA: Diagnosis not present

## 2015-10-20 DIAGNOSIS — D89813 Graft-versus-host disease, unspecified: Secondary | ICD-10-CM | POA: Diagnosis not present

## 2015-10-20 DIAGNOSIS — L989 Disorder of the skin and subcutaneous tissue, unspecified: Secondary | ICD-10-CM | POA: Diagnosis not present

## 2015-10-20 DIAGNOSIS — C9001 Multiple myeloma in remission: Secondary | ICD-10-CM | POA: Diagnosis not present

## 2015-10-20 DIAGNOSIS — C9000 Multiple myeloma not having achieved remission: Secondary | ICD-10-CM | POA: Diagnosis not present

## 2015-10-20 DIAGNOSIS — D89811 Chronic graft-versus-host disease: Secondary | ICD-10-CM | POA: Diagnosis not present

## 2015-11-03 DIAGNOSIS — I059 Rheumatic mitral valve disease, unspecified: Secondary | ICD-10-CM | POA: Diagnosis not present

## 2015-11-03 DIAGNOSIS — D89813 Graft-versus-host disease, unspecified: Secondary | ICD-10-CM | POA: Diagnosis not present

## 2015-11-03 DIAGNOSIS — R0609 Other forms of dyspnea: Secondary | ICD-10-CM | POA: Diagnosis not present

## 2015-11-03 DIAGNOSIS — L989 Disorder of the skin and subcutaneous tissue, unspecified: Secondary | ICD-10-CM | POA: Diagnosis not present

## 2015-11-03 DIAGNOSIS — Z9481 Bone marrow transplant status: Secondary | ICD-10-CM | POA: Diagnosis not present

## 2015-11-03 DIAGNOSIS — I359 Nonrheumatic aortic valve disorder, unspecified: Secondary | ICD-10-CM | POA: Diagnosis not present

## 2015-11-03 DIAGNOSIS — C9000 Multiple myeloma not having achieved remission: Secondary | ICD-10-CM | POA: Diagnosis not present

## 2015-11-03 DIAGNOSIS — Z5181 Encounter for therapeutic drug level monitoring: Secondary | ICD-10-CM | POA: Diagnosis not present

## 2015-11-04 DIAGNOSIS — D89813 Graft-versus-host disease, unspecified: Secondary | ICD-10-CM | POA: Diagnosis not present

## 2015-11-04 DIAGNOSIS — L989 Disorder of the skin and subcutaneous tissue, unspecified: Secondary | ICD-10-CM | POA: Diagnosis not present

## 2015-11-04 DIAGNOSIS — Z9481 Bone marrow transplant status: Secondary | ICD-10-CM | POA: Diagnosis not present

## 2015-11-17 DIAGNOSIS — R239 Unspecified skin changes: Secondary | ICD-10-CM | POA: Diagnosis not present

## 2015-11-17 DIAGNOSIS — Z5181 Encounter for therapeutic drug level monitoring: Secondary | ICD-10-CM | POA: Diagnosis not present

## 2015-11-17 DIAGNOSIS — D89813 Graft-versus-host disease, unspecified: Secondary | ICD-10-CM | POA: Diagnosis not present

## 2015-11-17 DIAGNOSIS — C9000 Multiple myeloma not having achieved remission: Secondary | ICD-10-CM | POA: Diagnosis not present

## 2015-11-17 DIAGNOSIS — T865 Complications of stem cell transplant: Secondary | ICD-10-CM | POA: Diagnosis not present

## 2015-11-17 DIAGNOSIS — Z79899 Other long term (current) drug therapy: Secondary | ICD-10-CM | POA: Diagnosis not present

## 2015-11-17 DIAGNOSIS — L989 Disorder of the skin and subcutaneous tissue, unspecified: Secondary | ICD-10-CM | POA: Diagnosis not present

## 2015-11-17 DIAGNOSIS — Y834 Other reconstructive surgery as the cause of abnormal reaction of the patient, or of later complication, without mention of misadventure at the time of the procedure: Secondary | ICD-10-CM | POA: Diagnosis not present

## 2015-11-18 DIAGNOSIS — Y834 Other reconstructive surgery as the cause of abnormal reaction of the patient, or of later complication, without mention of misadventure at the time of the procedure: Secondary | ICD-10-CM | POA: Diagnosis not present

## 2015-11-18 DIAGNOSIS — T8609 Other complications of bone marrow transplant: Secondary | ICD-10-CM | POA: Diagnosis not present

## 2015-11-18 DIAGNOSIS — D89813 Graft-versus-host disease, unspecified: Secondary | ICD-10-CM | POA: Diagnosis not present

## 2015-11-18 DIAGNOSIS — L989 Disorder of the skin and subcutaneous tissue, unspecified: Secondary | ICD-10-CM | POA: Diagnosis not present

## 2015-11-24 DIAGNOSIS — Z8579 Personal history of other malignant neoplasms of lymphoid, hematopoietic and related tissues: Secondary | ICD-10-CM | POA: Diagnosis not present

## 2015-12-01 DIAGNOSIS — Z5181 Encounter for therapeutic drug level monitoring: Secondary | ICD-10-CM | POA: Diagnosis not present

## 2015-12-01 DIAGNOSIS — Z9481 Bone marrow transplant status: Secondary | ICD-10-CM | POA: Diagnosis not present

## 2015-12-01 DIAGNOSIS — D89813 Graft-versus-host disease, unspecified: Secondary | ICD-10-CM | POA: Diagnosis not present

## 2015-12-01 DIAGNOSIS — L989 Disorder of the skin and subcutaneous tissue, unspecified: Secondary | ICD-10-CM | POA: Diagnosis not present

## 2015-12-02 DIAGNOSIS — Z79899 Other long term (current) drug therapy: Secondary | ICD-10-CM | POA: Diagnosis not present

## 2015-12-02 DIAGNOSIS — Z7901 Long term (current) use of anticoagulants: Secondary | ICD-10-CM | POA: Diagnosis not present

## 2015-12-02 DIAGNOSIS — D89813 Graft-versus-host disease, unspecified: Secondary | ICD-10-CM | POA: Diagnosis not present

## 2015-12-02 DIAGNOSIS — C9000 Multiple myeloma not having achieved remission: Secondary | ICD-10-CM | POA: Diagnosis not present

## 2015-12-02 DIAGNOSIS — T865 Complications of stem cell transplant: Secondary | ICD-10-CM | POA: Diagnosis not present

## 2015-12-02 DIAGNOSIS — Z7982 Long term (current) use of aspirin: Secondary | ICD-10-CM | POA: Diagnosis not present

## 2015-12-02 DIAGNOSIS — Y834 Other reconstructive surgery as the cause of abnormal reaction of the patient, or of later complication, without mention of misadventure at the time of the procedure: Secondary | ICD-10-CM | POA: Diagnosis not present

## 2015-12-02 DIAGNOSIS — L989 Disorder of the skin and subcutaneous tissue, unspecified: Secondary | ICD-10-CM | POA: Diagnosis not present

## 2015-12-09 DIAGNOSIS — C9000 Multiple myeloma not having achieved remission: Secondary | ICD-10-CM | POA: Diagnosis not present

## 2015-12-15 DIAGNOSIS — Z951 Presence of aortocoronary bypass graft: Secondary | ICD-10-CM | POA: Diagnosis not present

## 2015-12-15 DIAGNOSIS — D89813 Graft-versus-host disease, unspecified: Secondary | ICD-10-CM | POA: Diagnosis not present

## 2015-12-15 DIAGNOSIS — L989 Disorder of the skin and subcutaneous tissue, unspecified: Secondary | ICD-10-CM | POA: Diagnosis not present

## 2015-12-15 DIAGNOSIS — M5126 Other intervertebral disc displacement, lumbar region: Secondary | ICD-10-CM | POA: Diagnosis not present

## 2015-12-15 DIAGNOSIS — M5136 Other intervertebral disc degeneration, lumbar region: Secondary | ICD-10-CM | POA: Diagnosis not present

## 2015-12-15 DIAGNOSIS — M899 Disorder of bone, unspecified: Secondary | ICD-10-CM | POA: Diagnosis not present

## 2015-12-15 DIAGNOSIS — R911 Solitary pulmonary nodule: Secondary | ICD-10-CM | POA: Diagnosis not present

## 2015-12-15 DIAGNOSIS — C9000 Multiple myeloma not having achieved remission: Secondary | ICD-10-CM | POA: Diagnosis not present

## 2015-12-15 DIAGNOSIS — Z9481 Bone marrow transplant status: Secondary | ICD-10-CM | POA: Diagnosis not present

## 2015-12-15 DIAGNOSIS — Z79899 Other long term (current) drug therapy: Secondary | ICD-10-CM | POA: Diagnosis not present

## 2015-12-15 DIAGNOSIS — T8609 Other complications of bone marrow transplant: Secondary | ICD-10-CM | POA: Diagnosis not present

## 2015-12-15 DIAGNOSIS — Y834 Other reconstructive surgery as the cause of abnormal reaction of the patient, or of later complication, without mention of misadventure at the time of the procedure: Secondary | ICD-10-CM | POA: Diagnosis not present

## 2015-12-15 DIAGNOSIS — Z5181 Encounter for therapeutic drug level monitoring: Secondary | ICD-10-CM | POA: Diagnosis not present

## 2015-12-15 DIAGNOSIS — Z006 Encounter for examination for normal comparison and control in clinical research program: Secondary | ICD-10-CM | POA: Diagnosis not present

## 2015-12-15 DIAGNOSIS — I700 Atherosclerosis of aorta: Secondary | ICD-10-CM | POA: Diagnosis not present

## 2015-12-15 DIAGNOSIS — D89811 Chronic graft-versus-host disease: Secondary | ICD-10-CM | POA: Diagnosis not present

## 2015-12-16 DIAGNOSIS — K769 Liver disease, unspecified: Secondary | ICD-10-CM | POA: Diagnosis not present

## 2015-12-16 DIAGNOSIS — H579 Unspecified disorder of eye and adnexa: Secondary | ICD-10-CM | POA: Diagnosis not present

## 2015-12-16 DIAGNOSIS — D89811 Chronic graft-versus-host disease: Secondary | ICD-10-CM | POA: Diagnosis not present

## 2015-12-16 DIAGNOSIS — C9000 Multiple myeloma not having achieved remission: Secondary | ICD-10-CM | POA: Diagnosis not present

## 2015-12-16 DIAGNOSIS — T865 Complications of stem cell transplant: Secondary | ICD-10-CM | POA: Diagnosis not present

## 2015-12-16 DIAGNOSIS — Z006 Encounter for examination for normal comparison and control in clinical research program: Secondary | ICD-10-CM | POA: Diagnosis not present

## 2015-12-16 DIAGNOSIS — L989 Disorder of the skin and subcutaneous tissue, unspecified: Secondary | ICD-10-CM | POA: Diagnosis not present

## 2015-12-16 DIAGNOSIS — Y834 Other reconstructive surgery as the cause of abnormal reaction of the patient, or of later complication, without mention of misadventure at the time of the procedure: Secondary | ICD-10-CM | POA: Diagnosis not present

## 2015-12-16 DIAGNOSIS — D89813 Graft-versus-host disease, unspecified: Secondary | ICD-10-CM | POA: Diagnosis not present

## 2015-12-16 DIAGNOSIS — Z79899 Other long term (current) drug therapy: Secondary | ICD-10-CM | POA: Diagnosis not present

## 2015-12-17 DIAGNOSIS — Z4801 Encounter for change or removal of surgical wound dressing: Secondary | ICD-10-CM | POA: Diagnosis not present

## 2015-12-19 DIAGNOSIS — Z9481 Bone marrow transplant status: Secondary | ICD-10-CM | POA: Diagnosis not present

## 2015-12-19 DIAGNOSIS — C9000 Multiple myeloma not having achieved remission: Secondary | ICD-10-CM | POA: Diagnosis not present

## 2015-12-23 DIAGNOSIS — T865 Complications of stem cell transplant: Secondary | ICD-10-CM | POA: Diagnosis not present

## 2015-12-23 DIAGNOSIS — K3189 Other diseases of stomach and duodenum: Secondary | ICD-10-CM | POA: Diagnosis not present

## 2015-12-23 DIAGNOSIS — C9000 Multiple myeloma not having achieved remission: Secondary | ICD-10-CM | POA: Diagnosis not present

## 2015-12-23 DIAGNOSIS — K2980 Duodenitis without bleeding: Secondary | ICD-10-CM | POA: Diagnosis not present

## 2015-12-23 DIAGNOSIS — R1310 Dysphagia, unspecified: Secondary | ICD-10-CM | POA: Diagnosis not present

## 2015-12-23 DIAGNOSIS — K209 Esophagitis, unspecified: Secondary | ICD-10-CM | POA: Diagnosis not present

## 2015-12-23 DIAGNOSIS — D89813 Graft-versus-host disease, unspecified: Secondary | ICD-10-CM | POA: Diagnosis not present

## 2015-12-23 DIAGNOSIS — Z006 Encounter for examination for normal comparison and control in clinical research program: Secondary | ICD-10-CM | POA: Diagnosis not present

## 2015-12-29 DIAGNOSIS — L989 Disorder of the skin and subcutaneous tissue, unspecified: Secondary | ICD-10-CM | POA: Diagnosis not present

## 2015-12-29 DIAGNOSIS — C9000 Multiple myeloma not having achieved remission: Secondary | ICD-10-CM | POA: Diagnosis not present

## 2015-12-29 DIAGNOSIS — T865 Complications of stem cell transplant: Secondary | ICD-10-CM | POA: Diagnosis not present

## 2015-12-29 DIAGNOSIS — Y834 Other reconstructive surgery as the cause of abnormal reaction of the patient, or of later complication, without mention of misadventure at the time of the procedure: Secondary | ICD-10-CM | POA: Diagnosis not present

## 2015-12-29 DIAGNOSIS — D89813 Graft-versus-host disease, unspecified: Secondary | ICD-10-CM | POA: Diagnosis not present

## 2016-01-03 ENCOUNTER — Telehealth: Payer: Self-pay | Admitting: Family Medicine

## 2016-01-03 MED ORDER — AZELASTINE HCL 0.1 % NA SOLN: 2.0000 | Freq: Two times a day (BID) | NASAL | 12 refills | Status: DC

## 2016-01-03 NOTE — Telephone Encounter (Signed)
Pt needs refill Azelastine to [facility] [address]

## 2016-01-05 DIAGNOSIS — L989 Disorder of the skin and subcutaneous tissue, unspecified: Secondary | ICD-10-CM | POA: Diagnosis not present

## 2016-01-05 DIAGNOSIS — D89813 Graft-versus-host disease, unspecified: Secondary | ICD-10-CM | POA: Diagnosis not present

## 2016-01-05 DIAGNOSIS — Z79899 Other long term (current) drug therapy: Secondary | ICD-10-CM | POA: Diagnosis not present

## 2016-01-05 DIAGNOSIS — Y834 Other reconstructive surgery as the cause of abnormal reaction of the patient, or of later complication, without mention of misadventure at the time of the procedure: Secondary | ICD-10-CM | POA: Diagnosis not present

## 2016-01-05 DIAGNOSIS — C9000 Multiple myeloma not having achieved remission: Secondary | ICD-10-CM | POA: Diagnosis not present

## 2016-01-05 DIAGNOSIS — T8609 Other complications of bone marrow transplant: Secondary | ICD-10-CM | POA: Diagnosis not present

## 2016-01-06 ENCOUNTER — Encounter: Payer: Self-pay | Admitting: Medical

## 2016-01-12 DIAGNOSIS — D89813 Graft-versus-host disease, unspecified: Secondary | ICD-10-CM | POA: Diagnosis not present

## 2016-01-12 DIAGNOSIS — L989 Disorder of the skin and subcutaneous tissue, unspecified: Secondary | ICD-10-CM | POA: Diagnosis not present

## 2016-01-12 DIAGNOSIS — D89811 Chronic graft-versus-host disease: Secondary | ICD-10-CM | POA: Diagnosis not present

## 2016-01-12 DIAGNOSIS — C9000 Multiple myeloma not having achieved remission: Secondary | ICD-10-CM | POA: Diagnosis not present

## 2016-01-12 DIAGNOSIS — Z9481 Bone marrow transplant status: Secondary | ICD-10-CM | POA: Diagnosis not present

## 2016-01-19 DIAGNOSIS — C9000 Multiple myeloma not having achieved remission: Secondary | ICD-10-CM | POA: Diagnosis not present

## 2016-01-19 DIAGNOSIS — D89813 Graft-versus-host disease, unspecified: Secondary | ICD-10-CM | POA: Diagnosis not present

## 2016-01-19 DIAGNOSIS — Z7982 Long term (current) use of aspirin: Secondary | ICD-10-CM | POA: Diagnosis not present

## 2016-01-19 DIAGNOSIS — T865 Complications of stem cell transplant: Secondary | ICD-10-CM | POA: Diagnosis not present

## 2016-01-19 DIAGNOSIS — L989 Disorder of the skin and subcutaneous tissue, unspecified: Secondary | ICD-10-CM | POA: Diagnosis not present

## 2016-01-26 DIAGNOSIS — D89813 Graft-versus-host disease, unspecified: Secondary | ICD-10-CM | POA: Diagnosis not present

## 2016-01-26 DIAGNOSIS — L989 Disorder of the skin and subcutaneous tissue, unspecified: Secondary | ICD-10-CM | POA: Diagnosis not present

## 2016-01-26 DIAGNOSIS — Z5181 Encounter for therapeutic drug level monitoring: Secondary | ICD-10-CM | POA: Diagnosis not present

## 2016-01-26 DIAGNOSIS — Z9484 Stem cells transplant status: Secondary | ICD-10-CM | POA: Diagnosis not present

## 2016-01-26 DIAGNOSIS — Z9481 Bone marrow transplant status: Secondary | ICD-10-CM | POA: Diagnosis not present

## 2016-01-26 DIAGNOSIS — D89811 Chronic graft-versus-host disease: Secondary | ICD-10-CM | POA: Diagnosis not present

## 2016-01-26 DIAGNOSIS — C9000 Multiple myeloma not having achieved remission: Secondary | ICD-10-CM | POA: Diagnosis not present

## 2016-02-02 DIAGNOSIS — T8609 Other complications of bone marrow transplant: Secondary | ICD-10-CM | POA: Diagnosis not present

## 2016-02-02 DIAGNOSIS — D89813 Graft-versus-host disease, unspecified: Secondary | ICD-10-CM | POA: Diagnosis not present

## 2016-02-02 DIAGNOSIS — C9000 Multiple myeloma not having achieved remission: Secondary | ICD-10-CM | POA: Diagnosis not present

## 2016-02-02 DIAGNOSIS — Z5181 Encounter for therapeutic drug level monitoring: Secondary | ICD-10-CM | POA: Diagnosis not present

## 2016-02-02 DIAGNOSIS — Z79899 Other long term (current) drug therapy: Secondary | ICD-10-CM | POA: Diagnosis not present

## 2016-02-02 DIAGNOSIS — L989 Disorder of the skin and subcutaneous tissue, unspecified: Secondary | ICD-10-CM | POA: Diagnosis not present

## 2016-02-02 DIAGNOSIS — Y834 Other reconstructive surgery as the cause of abnormal reaction of the patient, or of later complication, without mention of misadventure at the time of the procedure: Secondary | ICD-10-CM | POA: Diagnosis not present

## 2016-02-09 DIAGNOSIS — Z9481 Bone marrow transplant status: Secondary | ICD-10-CM | POA: Diagnosis not present

## 2016-02-09 DIAGNOSIS — C9000 Multiple myeloma not having achieved remission: Secondary | ICD-10-CM | POA: Diagnosis not present

## 2016-02-09 DIAGNOSIS — D89813 Graft-versus-host disease, unspecified: Secondary | ICD-10-CM | POA: Diagnosis not present

## 2016-02-09 DIAGNOSIS — L989 Disorder of the skin and subcutaneous tissue, unspecified: Secondary | ICD-10-CM | POA: Diagnosis not present

## 2016-02-16 DIAGNOSIS — D649 Anemia, unspecified: Secondary | ICD-10-CM | POA: Diagnosis not present

## 2016-02-16 DIAGNOSIS — Z9481 Bone marrow transplant status: Secondary | ICD-10-CM | POA: Diagnosis not present

## 2016-02-16 DIAGNOSIS — Z79899 Other long term (current) drug therapy: Secondary | ICD-10-CM | POA: Diagnosis not present

## 2016-02-16 DIAGNOSIS — C9000 Multiple myeloma not having achieved remission: Secondary | ICD-10-CM | POA: Diagnosis not present

## 2016-02-16 DIAGNOSIS — Y834 Other reconstructive surgery as the cause of abnormal reaction of the patient, or of later complication, without mention of misadventure at the time of the procedure: Secondary | ICD-10-CM | POA: Diagnosis not present

## 2016-02-16 DIAGNOSIS — I2510 Atherosclerotic heart disease of native coronary artery without angina pectoris: Secondary | ICD-10-CM | POA: Diagnosis not present

## 2016-02-16 DIAGNOSIS — D89811 Chronic graft-versus-host disease: Secondary | ICD-10-CM | POA: Diagnosis not present

## 2016-02-16 DIAGNOSIS — L989 Disorder of the skin and subcutaneous tissue, unspecified: Secondary | ICD-10-CM | POA: Diagnosis not present

## 2016-02-16 DIAGNOSIS — D89813 Graft-versus-host disease, unspecified: Secondary | ICD-10-CM | POA: Diagnosis not present

## 2016-02-16 DIAGNOSIS — D696 Thrombocytopenia, unspecified: Secondary | ICD-10-CM | POA: Diagnosis not present

## 2016-02-16 DIAGNOSIS — C9001 Multiple myeloma in remission: Secondary | ICD-10-CM | POA: Diagnosis not present

## 2016-02-16 DIAGNOSIS — Z5181 Encounter for therapeutic drug level monitoring: Secondary | ICD-10-CM | POA: Diagnosis not present

## 2016-02-23 DIAGNOSIS — T865 Complications of stem cell transplant: Secondary | ICD-10-CM | POA: Diagnosis not present

## 2016-02-23 DIAGNOSIS — D89811 Chronic graft-versus-host disease: Secondary | ICD-10-CM | POA: Diagnosis not present

## 2016-02-23 DIAGNOSIS — L989 Disorder of the skin and subcutaneous tissue, unspecified: Secondary | ICD-10-CM | POA: Diagnosis not present

## 2016-02-23 DIAGNOSIS — C9000 Multiple myeloma not having achieved remission: Secondary | ICD-10-CM | POA: Diagnosis not present

## 2016-02-23 DIAGNOSIS — D89813 Graft-versus-host disease, unspecified: Secondary | ICD-10-CM | POA: Diagnosis not present

## 2016-02-23 DIAGNOSIS — Z9481 Bone marrow transplant status: Secondary | ICD-10-CM | POA: Diagnosis not present

## 2016-02-23 DIAGNOSIS — Z5181 Encounter for therapeutic drug level monitoring: Secondary | ICD-10-CM | POA: Diagnosis not present

## 2016-02-23 DIAGNOSIS — L988 Other specified disorders of the skin and subcutaneous tissue: Secondary | ICD-10-CM | POA: Diagnosis not present

## 2016-03-01 DIAGNOSIS — Z9481 Bone marrow transplant status: Secondary | ICD-10-CM | POA: Diagnosis not present

## 2016-03-01 DIAGNOSIS — Z5181 Encounter for therapeutic drug level monitoring: Secondary | ICD-10-CM | POA: Diagnosis not present

## 2016-03-01 DIAGNOSIS — D89813 Graft-versus-host disease, unspecified: Secondary | ICD-10-CM | POA: Diagnosis not present

## 2016-03-01 DIAGNOSIS — Y834 Other reconstructive surgery as the cause of abnormal reaction of the patient, or of later complication, without mention of misadventure at the time of the procedure: Secondary | ICD-10-CM | POA: Diagnosis not present

## 2016-03-01 DIAGNOSIS — D89811 Chronic graft-versus-host disease: Secondary | ICD-10-CM | POA: Diagnosis not present

## 2016-03-01 DIAGNOSIS — T8609 Other complications of bone marrow transplant: Secondary | ICD-10-CM | POA: Diagnosis not present

## 2016-03-01 DIAGNOSIS — C9000 Multiple myeloma not having achieved remission: Secondary | ICD-10-CM | POA: Diagnosis not present

## 2016-03-01 DIAGNOSIS — Z79899 Other long term (current) drug therapy: Secondary | ICD-10-CM | POA: Diagnosis not present

## 2016-03-08 DIAGNOSIS — D89811 Chronic graft-versus-host disease: Secondary | ICD-10-CM | POA: Diagnosis not present

## 2016-03-15 DIAGNOSIS — L989 Disorder of the skin and subcutaneous tissue, unspecified: Secondary | ICD-10-CM | POA: Diagnosis not present

## 2016-03-15 DIAGNOSIS — Z5181 Encounter for therapeutic drug level monitoring: Secondary | ICD-10-CM | POA: Diagnosis not present

## 2016-03-15 DIAGNOSIS — Z9481 Bone marrow transplant status: Secondary | ICD-10-CM | POA: Diagnosis not present

## 2016-03-15 DIAGNOSIS — D89813 Graft-versus-host disease, unspecified: Secondary | ICD-10-CM | POA: Diagnosis not present

## 2016-03-15 DIAGNOSIS — Z79899 Other long term (current) drug therapy: Secondary | ICD-10-CM | POA: Diagnosis not present

## 2016-03-29 DIAGNOSIS — C9000 Multiple myeloma not having achieved remission: Secondary | ICD-10-CM | POA: Diagnosis not present

## 2016-03-29 DIAGNOSIS — L989 Disorder of the skin and subcutaneous tissue, unspecified: Secondary | ICD-10-CM | POA: Diagnosis not present

## 2016-03-29 DIAGNOSIS — Z79899 Other long term (current) drug therapy: Secondary | ICD-10-CM | POA: Diagnosis not present

## 2016-03-29 DIAGNOSIS — D89813 Graft-versus-host disease, unspecified: Secondary | ICD-10-CM | POA: Diagnosis not present

## 2016-03-29 DIAGNOSIS — T865 Complications of stem cell transplant: Secondary | ICD-10-CM | POA: Diagnosis not present

## 2016-03-29 DIAGNOSIS — Z9481 Bone marrow transplant status: Secondary | ICD-10-CM | POA: Diagnosis not present

## 2016-03-29 DIAGNOSIS — Y834 Other reconstructive surgery as the cause of abnormal reaction of the patient, or of later complication, without mention of misadventure at the time of the procedure: Secondary | ICD-10-CM | POA: Diagnosis not present

## 2016-03-30 DIAGNOSIS — Y834 Other reconstructive surgery as the cause of abnormal reaction of the patient, or of later complication, without mention of misadventure at the time of the procedure: Secondary | ICD-10-CM | POA: Diagnosis not present

## 2016-03-30 DIAGNOSIS — C9000 Multiple myeloma not having achieved remission: Secondary | ICD-10-CM | POA: Diagnosis not present

## 2016-03-30 DIAGNOSIS — D89813 Graft-versus-host disease, unspecified: Secondary | ICD-10-CM | POA: Diagnosis not present

## 2016-03-30 DIAGNOSIS — T865 Complications of stem cell transplant: Secondary | ICD-10-CM | POA: Diagnosis not present

## 2016-04-10 DIAGNOSIS — Y834 Other reconstructive surgery as the cause of abnormal reaction of the patient, or of later complication, without mention of misadventure at the time of the procedure: Secondary | ICD-10-CM | POA: Diagnosis not present

## 2016-04-10 DIAGNOSIS — T865 Complications of stem cell transplant: Secondary | ICD-10-CM | POA: Diagnosis not present

## 2016-04-10 DIAGNOSIS — L989 Disorder of the skin and subcutaneous tissue, unspecified: Secondary | ICD-10-CM | POA: Diagnosis not present

## 2016-04-10 DIAGNOSIS — C9000 Multiple myeloma not having achieved remission: Secondary | ICD-10-CM | POA: Diagnosis not present

## 2016-04-10 DIAGNOSIS — D89813 Graft-versus-host disease, unspecified: Secondary | ICD-10-CM | POA: Diagnosis not present

## 2016-04-12 DIAGNOSIS — D509 Iron deficiency anemia, unspecified: Secondary | ICD-10-CM | POA: Diagnosis not present

## 2016-04-12 DIAGNOSIS — C9000 Multiple myeloma not having achieved remission: Secondary | ICD-10-CM | POA: Diagnosis not present

## 2016-04-12 DIAGNOSIS — T8601 Bone marrow transplant rejection: Secondary | ICD-10-CM | POA: Diagnosis not present

## 2016-04-12 DIAGNOSIS — Y834 Other reconstructive surgery as the cause of abnormal reaction of the patient, or of later complication, without mention of misadventure at the time of the procedure: Secondary | ICD-10-CM | POA: Diagnosis not present

## 2016-04-12 DIAGNOSIS — Z8579 Personal history of other malignant neoplasms of lymphoid, hematopoietic and related tissues: Secondary | ICD-10-CM | POA: Diagnosis not present

## 2016-04-12 DIAGNOSIS — D89811 Chronic graft-versus-host disease: Secondary | ICD-10-CM | POA: Diagnosis not present

## 2016-04-12 DIAGNOSIS — Z9481 Bone marrow transplant status: Secondary | ICD-10-CM | POA: Diagnosis not present

## 2016-04-12 DIAGNOSIS — D89813 Graft-versus-host disease, unspecified: Secondary | ICD-10-CM | POA: Diagnosis not present

## 2016-04-18 DIAGNOSIS — D89813 Graft-versus-host disease, unspecified: Secondary | ICD-10-CM | POA: Diagnosis not present

## 2016-04-18 DIAGNOSIS — C9000 Multiple myeloma not having achieved remission: Secondary | ICD-10-CM | POA: Diagnosis not present

## 2016-04-18 DIAGNOSIS — L989 Disorder of the skin and subcutaneous tissue, unspecified: Secondary | ICD-10-CM | POA: Diagnosis not present

## 2016-04-18 DIAGNOSIS — T865 Complications of stem cell transplant: Secondary | ICD-10-CM | POA: Diagnosis not present

## 2016-04-18 DIAGNOSIS — D89811 Chronic graft-versus-host disease: Secondary | ICD-10-CM | POA: Diagnosis not present

## 2016-04-18 DIAGNOSIS — Z9481 Bone marrow transplant status: Secondary | ICD-10-CM | POA: Diagnosis not present

## 2016-04-26 DIAGNOSIS — T8609 Other complications of bone marrow transplant: Secondary | ICD-10-CM | POA: Diagnosis not present

## 2016-04-26 DIAGNOSIS — C9000 Multiple myeloma not having achieved remission: Secondary | ICD-10-CM | POA: Diagnosis not present

## 2016-04-26 DIAGNOSIS — D89813 Graft-versus-host disease, unspecified: Secondary | ICD-10-CM | POA: Diagnosis not present

## 2016-04-26 DIAGNOSIS — L989 Disorder of the skin and subcutaneous tissue, unspecified: Secondary | ICD-10-CM | POA: Diagnosis not present

## 2016-04-27 DIAGNOSIS — D89813 Graft-versus-host disease, unspecified: Secondary | ICD-10-CM | POA: Diagnosis not present

## 2016-04-27 DIAGNOSIS — C9000 Multiple myeloma not having achieved remission: Secondary | ICD-10-CM | POA: Diagnosis not present

## 2016-04-27 DIAGNOSIS — T865 Complications of stem cell transplant: Secondary | ICD-10-CM | POA: Diagnosis not present

## 2016-04-27 DIAGNOSIS — L989 Disorder of the skin and subcutaneous tissue, unspecified: Secondary | ICD-10-CM | POA: Diagnosis not present

## 2016-04-27 DIAGNOSIS — Z79899 Other long term (current) drug therapy: Secondary | ICD-10-CM | POA: Diagnosis not present

## 2016-05-09 DIAGNOSIS — C9000 Multiple myeloma not having achieved remission: Secondary | ICD-10-CM | POA: Diagnosis not present

## 2016-05-09 DIAGNOSIS — Y834 Other reconstructive surgery as the cause of abnormal reaction of the patient, or of later complication, without mention of misadventure at the time of the procedure: Secondary | ICD-10-CM | POA: Diagnosis not present

## 2016-05-09 DIAGNOSIS — Z5181 Encounter for therapeutic drug level monitoring: Secondary | ICD-10-CM | POA: Diagnosis not present

## 2016-05-09 DIAGNOSIS — D89813 Graft-versus-host disease, unspecified: Secondary | ICD-10-CM | POA: Diagnosis not present

## 2016-05-09 DIAGNOSIS — T865 Complications of stem cell transplant: Secondary | ICD-10-CM | POA: Diagnosis not present

## 2016-05-09 DIAGNOSIS — L989 Disorder of the skin and subcutaneous tissue, unspecified: Secondary | ICD-10-CM | POA: Diagnosis not present

## 2016-05-10 DIAGNOSIS — D89813 Graft-versus-host disease, unspecified: Secondary | ICD-10-CM | POA: Diagnosis not present

## 2016-05-10 DIAGNOSIS — Z951 Presence of aortocoronary bypass graft: Secondary | ICD-10-CM | POA: Diagnosis not present

## 2016-05-10 DIAGNOSIS — Z5181 Encounter for therapeutic drug level monitoring: Secondary | ICD-10-CM | POA: Diagnosis not present

## 2016-05-10 DIAGNOSIS — I359 Nonrheumatic aortic valve disorder, unspecified: Secondary | ICD-10-CM | POA: Diagnosis not present

## 2016-05-10 DIAGNOSIS — I083 Combined rheumatic disorders of mitral, aortic and tricuspid valves: Secondary | ICD-10-CM | POA: Diagnosis not present

## 2016-05-10 DIAGNOSIS — C9000 Multiple myeloma not having achieved remission: Secondary | ICD-10-CM | POA: Diagnosis not present

## 2016-05-10 DIAGNOSIS — T865 Complications of stem cell transplant: Secondary | ICD-10-CM | POA: Diagnosis not present

## 2016-05-10 DIAGNOSIS — I10 Essential (primary) hypertension: Secondary | ICD-10-CM | POA: Diagnosis not present

## 2016-05-10 DIAGNOSIS — I059 Rheumatic mitral valve disease, unspecified: Secondary | ICD-10-CM | POA: Diagnosis not present

## 2016-05-10 DIAGNOSIS — Z7982 Long term (current) use of aspirin: Secondary | ICD-10-CM | POA: Diagnosis not present

## 2016-05-10 DIAGNOSIS — I2510 Atherosclerotic heart disease of native coronary artery without angina pectoris: Secondary | ICD-10-CM | POA: Diagnosis not present

## 2016-05-10 DIAGNOSIS — R0609 Other forms of dyspnea: Secondary | ICD-10-CM | POA: Diagnosis not present

## 2016-05-10 DIAGNOSIS — Y834 Other reconstructive surgery as the cause of abnormal reaction of the patient, or of later complication, without mention of misadventure at the time of the procedure: Secondary | ICD-10-CM | POA: Diagnosis not present

## 2016-05-23 DIAGNOSIS — T865 Complications of stem cell transplant: Secondary | ICD-10-CM | POA: Diagnosis not present

## 2016-05-23 DIAGNOSIS — D89811 Chronic graft-versus-host disease: Secondary | ICD-10-CM | POA: Diagnosis not present

## 2016-05-23 DIAGNOSIS — Z5181 Encounter for therapeutic drug level monitoring: Secondary | ICD-10-CM | POA: Diagnosis not present

## 2016-05-23 DIAGNOSIS — C9000 Multiple myeloma not having achieved remission: Secondary | ICD-10-CM | POA: Diagnosis not present

## 2016-05-23 DIAGNOSIS — Z9481 Bone marrow transplant status: Secondary | ICD-10-CM | POA: Diagnosis not present

## 2016-05-24 DIAGNOSIS — L989 Disorder of the skin and subcutaneous tissue, unspecified: Secondary | ICD-10-CM | POA: Diagnosis not present

## 2016-05-24 DIAGNOSIS — C9000 Multiple myeloma not having achieved remission: Secondary | ICD-10-CM | POA: Diagnosis not present

## 2016-05-24 DIAGNOSIS — Z79899 Other long term (current) drug therapy: Secondary | ICD-10-CM | POA: Diagnosis not present

## 2016-05-24 DIAGNOSIS — T8609 Other complications of bone marrow transplant: Secondary | ICD-10-CM | POA: Diagnosis not present

## 2016-05-24 DIAGNOSIS — D89813 Graft-versus-host disease, unspecified: Secondary | ICD-10-CM | POA: Diagnosis not present

## 2016-06-07 DIAGNOSIS — Y830 Surgical operation with transplant of whole organ as the cause of abnormal reaction of the patient, or of later complication, without mention of misadventure at the time of the procedure: Secondary | ICD-10-CM | POA: Diagnosis not present

## 2016-06-07 DIAGNOSIS — C9000 Multiple myeloma not having achieved remission: Secondary | ICD-10-CM | POA: Diagnosis not present

## 2016-06-07 DIAGNOSIS — Z5181 Encounter for therapeutic drug level monitoring: Secondary | ICD-10-CM | POA: Diagnosis not present

## 2016-06-07 DIAGNOSIS — D89813 Graft-versus-host disease, unspecified: Secondary | ICD-10-CM | POA: Diagnosis not present

## 2016-06-07 DIAGNOSIS — T865 Complications of stem cell transplant: Secondary | ICD-10-CM | POA: Diagnosis not present

## 2016-06-08 DIAGNOSIS — Z9484 Stem cells transplant status: Secondary | ICD-10-CM | POA: Diagnosis not present

## 2016-06-08 DIAGNOSIS — D89813 Graft-versus-host disease, unspecified: Secondary | ICD-10-CM | POA: Diagnosis not present

## 2016-06-08 DIAGNOSIS — Y830 Surgical operation with transplant of whole organ as the cause of abnormal reaction of the patient, or of later complication, without mention of misadventure at the time of the procedure: Secondary | ICD-10-CM | POA: Diagnosis not present

## 2016-06-08 DIAGNOSIS — D89811 Chronic graft-versus-host disease: Secondary | ICD-10-CM | POA: Diagnosis not present

## 2016-06-08 DIAGNOSIS — L989 Disorder of the skin and subcutaneous tissue, unspecified: Secondary | ICD-10-CM | POA: Diagnosis not present

## 2016-06-08 DIAGNOSIS — T8609 Other complications of bone marrow transplant: Secondary | ICD-10-CM | POA: Diagnosis not present

## 2016-06-08 DIAGNOSIS — C9000 Multiple myeloma not having achieved remission: Secondary | ICD-10-CM | POA: Diagnosis not present

## 2016-06-08 DIAGNOSIS — L988 Other specified disorders of the skin and subcutaneous tissue: Secondary | ICD-10-CM | POA: Diagnosis not present

## 2016-06-20 DIAGNOSIS — D89813 Graft-versus-host disease, unspecified: Secondary | ICD-10-CM | POA: Diagnosis not present

## 2016-06-20 DIAGNOSIS — C9000 Multiple myeloma not having achieved remission: Secondary | ICD-10-CM | POA: Diagnosis not present

## 2016-06-20 DIAGNOSIS — T86821 Skin graft (allograft) (autograft) failure: Secondary | ICD-10-CM | POA: Diagnosis not present

## 2016-06-20 DIAGNOSIS — L989 Disorder of the skin and subcutaneous tissue, unspecified: Secondary | ICD-10-CM | POA: Diagnosis not present

## 2016-06-21 DIAGNOSIS — T865 Complications of stem cell transplant: Secondary | ICD-10-CM | POA: Diagnosis not present

## 2016-06-21 DIAGNOSIS — D89813 Graft-versus-host disease, unspecified: Secondary | ICD-10-CM | POA: Diagnosis not present

## 2016-06-21 DIAGNOSIS — Y834 Other reconstructive surgery as the cause of abnormal reaction of the patient, or of later complication, without mention of misadventure at the time of the procedure: Secondary | ICD-10-CM | POA: Diagnosis not present

## 2016-06-21 DIAGNOSIS — Z5181 Encounter for therapeutic drug level monitoring: Secondary | ICD-10-CM | POA: Diagnosis not present

## 2016-06-25 ENCOUNTER — Encounter: Payer: BLUE CROSS/BLUE SHIELD | Admitting: Family Medicine

## 2016-07-04 DIAGNOSIS — Z951 Presence of aortocoronary bypass graft: Secondary | ICD-10-CM | POA: Diagnosis not present

## 2016-07-04 DIAGNOSIS — R739 Hyperglycemia, unspecified: Secondary | ICD-10-CM | POA: Diagnosis not present

## 2016-07-04 DIAGNOSIS — M25511 Pain in right shoulder: Secondary | ICD-10-CM | POA: Diagnosis not present

## 2016-07-04 DIAGNOSIS — L989 Disorder of the skin and subcutaneous tissue, unspecified: Secondary | ICD-10-CM | POA: Diagnosis not present

## 2016-07-04 DIAGNOSIS — Y830 Surgical operation with transplant of whole organ as the cause of abnormal reaction of the patient, or of later complication, without mention of misadventure at the time of the procedure: Secondary | ICD-10-CM | POA: Diagnosis not present

## 2016-07-04 DIAGNOSIS — T865 Complications of stem cell transplant: Secondary | ICD-10-CM | POA: Diagnosis not present

## 2016-07-04 DIAGNOSIS — D89811 Chronic graft-versus-host disease: Secondary | ICD-10-CM | POA: Diagnosis not present

## 2016-07-04 DIAGNOSIS — D89813 Graft-versus-host disease, unspecified: Secondary | ICD-10-CM | POA: Diagnosis not present

## 2016-07-04 DIAGNOSIS — D509 Iron deficiency anemia, unspecified: Secondary | ICD-10-CM | POA: Diagnosis not present

## 2016-07-04 DIAGNOSIS — M109 Gout, unspecified: Secondary | ICD-10-CM | POA: Diagnosis not present

## 2016-07-04 DIAGNOSIS — Z7982 Long term (current) use of aspirin: Secondary | ICD-10-CM | POA: Diagnosis not present

## 2016-07-04 DIAGNOSIS — Z9221 Personal history of antineoplastic chemotherapy: Secondary | ICD-10-CM | POA: Diagnosis not present

## 2016-07-04 DIAGNOSIS — G629 Polyneuropathy, unspecified: Secondary | ICD-10-CM | POA: Diagnosis not present

## 2016-07-04 DIAGNOSIS — T380X5A Adverse effect of glucocorticoids and synthetic analogues, initial encounter: Secondary | ICD-10-CM | POA: Diagnosis not present

## 2016-07-04 DIAGNOSIS — Z9481 Bone marrow transplant status: Secondary | ICD-10-CM | POA: Diagnosis not present

## 2016-07-04 DIAGNOSIS — C9000 Multiple myeloma not having achieved remission: Secondary | ICD-10-CM | POA: Diagnosis not present

## 2016-07-04 DIAGNOSIS — I2510 Atherosclerotic heart disease of native coronary artery without angina pectoris: Secondary | ICD-10-CM | POA: Diagnosis not present

## 2016-07-04 DIAGNOSIS — Z7901 Long term (current) use of anticoagulants: Secondary | ICD-10-CM | POA: Diagnosis not present

## 2016-07-04 DIAGNOSIS — Z006 Encounter for examination for normal comparison and control in clinical research program: Secondary | ICD-10-CM | POA: Diagnosis not present

## 2016-07-05 DIAGNOSIS — L989 Disorder of the skin and subcutaneous tissue, unspecified: Secondary | ICD-10-CM | POA: Diagnosis not present

## 2016-07-05 DIAGNOSIS — T865 Complications of stem cell transplant: Secondary | ICD-10-CM | POA: Diagnosis not present

## 2016-07-05 DIAGNOSIS — Y834 Other reconstructive surgery as the cause of abnormal reaction of the patient, or of later complication, without mention of misadventure at the time of the procedure: Secondary | ICD-10-CM | POA: Diagnosis not present

## 2016-07-05 DIAGNOSIS — Z5181 Encounter for therapeutic drug level monitoring: Secondary | ICD-10-CM | POA: Diagnosis not present

## 2016-07-05 DIAGNOSIS — D89813 Graft-versus-host disease, unspecified: Secondary | ICD-10-CM | POA: Diagnosis not present

## 2016-07-19 DIAGNOSIS — Z79899 Other long term (current) drug therapy: Secondary | ICD-10-CM | POA: Diagnosis not present

## 2016-07-19 DIAGNOSIS — L989 Disorder of the skin and subcutaneous tissue, unspecified: Secondary | ICD-10-CM | POA: Diagnosis not present

## 2016-07-19 DIAGNOSIS — Y830 Surgical operation with transplant of whole organ as the cause of abnormal reaction of the patient, or of later complication, without mention of misadventure at the time of the procedure: Secondary | ICD-10-CM | POA: Diagnosis not present

## 2016-07-19 DIAGNOSIS — D89813 Graft-versus-host disease, unspecified: Secondary | ICD-10-CM | POA: Diagnosis not present

## 2016-07-19 DIAGNOSIS — Z5181 Encounter for therapeutic drug level monitoring: Secondary | ICD-10-CM | POA: Diagnosis not present

## 2016-07-19 DIAGNOSIS — C9000 Multiple myeloma not having achieved remission: Secondary | ICD-10-CM | POA: Diagnosis not present

## 2016-07-19 DIAGNOSIS — T865 Complications of stem cell transplant: Secondary | ICD-10-CM | POA: Diagnosis not present

## 2016-07-20 DIAGNOSIS — Z79899 Other long term (current) drug therapy: Secondary | ICD-10-CM | POA: Diagnosis not present

## 2016-07-20 DIAGNOSIS — Z5181 Encounter for therapeutic drug level monitoring: Secondary | ICD-10-CM | POA: Diagnosis not present

## 2016-07-20 DIAGNOSIS — C9000 Multiple myeloma not having achieved remission: Secondary | ICD-10-CM | POA: Diagnosis not present

## 2016-07-20 DIAGNOSIS — Z9481 Bone marrow transplant status: Secondary | ICD-10-CM | POA: Diagnosis not present

## 2016-07-20 DIAGNOSIS — C9001 Multiple myeloma in remission: Secondary | ICD-10-CM | POA: Diagnosis not present

## 2016-07-20 DIAGNOSIS — Z9484 Stem cells transplant status: Secondary | ICD-10-CM | POA: Diagnosis not present

## 2016-08-09 DIAGNOSIS — Y834 Other reconstructive surgery as the cause of abnormal reaction of the patient, or of later complication, without mention of misadventure at the time of the procedure: Secondary | ICD-10-CM | POA: Diagnosis not present

## 2016-08-09 DIAGNOSIS — T865 Complications of stem cell transplant: Secondary | ICD-10-CM | POA: Diagnosis not present

## 2016-08-09 DIAGNOSIS — D89813 Graft-versus-host disease, unspecified: Secondary | ICD-10-CM | POA: Diagnosis not present

## 2016-08-09 DIAGNOSIS — L989 Disorder of the skin and subcutaneous tissue, unspecified: Secondary | ICD-10-CM | POA: Diagnosis not present

## 2016-08-10 DIAGNOSIS — L989 Disorder of the skin and subcutaneous tissue, unspecified: Secondary | ICD-10-CM | POA: Diagnosis not present

## 2016-08-10 DIAGNOSIS — T8609 Other complications of bone marrow transplant: Secondary | ICD-10-CM | POA: Diagnosis not present

## 2016-08-10 DIAGNOSIS — D89813 Graft-versus-host disease, unspecified: Secondary | ICD-10-CM | POA: Diagnosis not present

## 2016-08-10 DIAGNOSIS — Y830 Surgical operation with transplant of whole organ as the cause of abnormal reaction of the patient, or of later complication, without mention of misadventure at the time of the procedure: Secondary | ICD-10-CM | POA: Diagnosis not present

## 2016-09-06 DIAGNOSIS — L989 Disorder of the skin and subcutaneous tissue, unspecified: Secondary | ICD-10-CM | POA: Diagnosis not present

## 2016-09-06 DIAGNOSIS — Y834 Other reconstructive surgery as the cause of abnormal reaction of the patient, or of later complication, without mention of misadventure at the time of the procedure: Secondary | ICD-10-CM | POA: Diagnosis not present

## 2016-09-06 DIAGNOSIS — D89813 Graft-versus-host disease, unspecified: Secondary | ICD-10-CM | POA: Diagnosis not present

## 2016-09-06 DIAGNOSIS — T865 Complications of stem cell transplant: Secondary | ICD-10-CM | POA: Diagnosis not present

## 2016-09-07 DIAGNOSIS — D89813 Graft-versus-host disease, unspecified: Secondary | ICD-10-CM | POA: Diagnosis not present

## 2016-09-07 DIAGNOSIS — L989 Disorder of the skin and subcutaneous tissue, unspecified: Secondary | ICD-10-CM | POA: Diagnosis not present

## 2016-09-07 DIAGNOSIS — T865 Complications of stem cell transplant: Secondary | ICD-10-CM | POA: Diagnosis not present

## 2016-09-07 DIAGNOSIS — Y834 Other reconstructive surgery as the cause of abnormal reaction of the patient, or of later complication, without mention of misadventure at the time of the procedure: Secondary | ICD-10-CM | POA: Diagnosis not present

## 2016-10-03 DIAGNOSIS — H01022 Squamous blepharitis right lower eyelid: Secondary | ICD-10-CM | POA: Diagnosis not present

## 2016-10-03 DIAGNOSIS — D89813 Graft-versus-host disease, unspecified: Secondary | ICD-10-CM | POA: Diagnosis not present

## 2016-10-03 DIAGNOSIS — H01021 Squamous blepharitis right upper eyelid: Secondary | ICD-10-CM | POA: Diagnosis not present

## 2016-10-03 DIAGNOSIS — H16223 Keratoconjunctivitis sicca, not specified as Sjogren's, bilateral: Secondary | ICD-10-CM | POA: Diagnosis not present

## 2016-10-04 DIAGNOSIS — Z5181 Encounter for therapeutic drug level monitoring: Secondary | ICD-10-CM | POA: Diagnosis not present

## 2016-10-04 DIAGNOSIS — Y834 Other reconstructive surgery as the cause of abnormal reaction of the patient, or of later complication, without mention of misadventure at the time of the procedure: Secondary | ICD-10-CM | POA: Diagnosis not present

## 2016-10-04 DIAGNOSIS — T865 Complications of stem cell transplant: Secondary | ICD-10-CM | POA: Diagnosis not present

## 2016-10-04 DIAGNOSIS — D89813 Graft-versus-host disease, unspecified: Secondary | ICD-10-CM | POA: Diagnosis not present

## 2016-10-04 DIAGNOSIS — L989 Disorder of the skin and subcutaneous tissue, unspecified: Secondary | ICD-10-CM | POA: Diagnosis not present

## 2016-10-05 DIAGNOSIS — D89813 Graft-versus-host disease, unspecified: Secondary | ICD-10-CM | POA: Diagnosis not present

## 2016-10-05 DIAGNOSIS — C9000 Multiple myeloma not having achieved remission: Secondary | ICD-10-CM | POA: Diagnosis not present

## 2016-10-05 DIAGNOSIS — L989 Disorder of the skin and subcutaneous tissue, unspecified: Secondary | ICD-10-CM | POA: Diagnosis not present

## 2016-10-05 DIAGNOSIS — D89811 Chronic graft-versus-host disease: Secondary | ICD-10-CM | POA: Diagnosis not present

## 2016-10-05 DIAGNOSIS — T865 Complications of stem cell transplant: Secondary | ICD-10-CM | POA: Diagnosis not present

## 2016-10-05 DIAGNOSIS — Y830 Surgical operation with transplant of whole organ as the cause of abnormal reaction of the patient, or of later complication, without mention of misadventure at the time of the procedure: Secondary | ICD-10-CM | POA: Diagnosis not present

## 2016-11-01 DIAGNOSIS — I059 Rheumatic mitral valve disease, unspecified: Secondary | ICD-10-CM | POA: Diagnosis not present

## 2016-11-01 DIAGNOSIS — C9000 Multiple myeloma not having achieved remission: Secondary | ICD-10-CM | POA: Diagnosis not present

## 2016-11-01 DIAGNOSIS — I359 Nonrheumatic aortic valve disorder, unspecified: Secondary | ICD-10-CM | POA: Diagnosis not present

## 2016-11-01 DIAGNOSIS — I2510 Atherosclerotic heart disease of native coronary artery without angina pectoris: Secondary | ICD-10-CM | POA: Diagnosis not present

## 2016-11-01 DIAGNOSIS — L989 Disorder of the skin and subcutaneous tissue, unspecified: Secondary | ICD-10-CM | POA: Diagnosis not present

## 2016-11-01 DIAGNOSIS — Z9481 Bone marrow transplant status: Secondary | ICD-10-CM | POA: Diagnosis not present

## 2016-11-01 DIAGNOSIS — Z5181 Encounter for therapeutic drug level monitoring: Secondary | ICD-10-CM | POA: Diagnosis not present

## 2016-11-01 DIAGNOSIS — Z8639 Personal history of other endocrine, nutritional and metabolic disease: Secondary | ICD-10-CM | POA: Diagnosis not present

## 2016-11-01 DIAGNOSIS — D89813 Graft-versus-host disease, unspecified: Secondary | ICD-10-CM | POA: Diagnosis not present

## 2016-11-02 DIAGNOSIS — T8609 Other complications of bone marrow transplant: Secondary | ICD-10-CM | POA: Diagnosis not present

## 2016-11-02 DIAGNOSIS — Z5181 Encounter for therapeutic drug level monitoring: Secondary | ICD-10-CM | POA: Diagnosis not present

## 2016-11-02 DIAGNOSIS — D89813 Graft-versus-host disease, unspecified: Secondary | ICD-10-CM | POA: Diagnosis not present

## 2016-11-02 DIAGNOSIS — Z79899 Other long term (current) drug therapy: Secondary | ICD-10-CM | POA: Diagnosis not present

## 2016-11-02 DIAGNOSIS — Y834 Other reconstructive surgery as the cause of abnormal reaction of the patient, or of later complication, without mention of misadventure at the time of the procedure: Secondary | ICD-10-CM | POA: Diagnosis not present

## 2016-11-02 DIAGNOSIS — L989 Disorder of the skin and subcutaneous tissue, unspecified: Secondary | ICD-10-CM | POA: Diagnosis not present

## 2016-11-02 DIAGNOSIS — C9000 Multiple myeloma not having achieved remission: Secondary | ICD-10-CM | POA: Diagnosis not present

## 2016-11-14 DIAGNOSIS — H40053 Ocular hypertension, bilateral: Secondary | ICD-10-CM | POA: Diagnosis not present

## 2016-11-14 DIAGNOSIS — D89813 Graft-versus-host disease, unspecified: Secondary | ICD-10-CM | POA: Diagnosis not present

## 2016-11-14 DIAGNOSIS — H2513 Age-related nuclear cataract, bilateral: Secondary | ICD-10-CM | POA: Diagnosis not present

## 2016-11-14 DIAGNOSIS — H16223 Keratoconjunctivitis sicca, not specified as Sjogren's, bilateral: Secondary | ICD-10-CM | POA: Diagnosis not present

## 2016-11-19 DIAGNOSIS — H01021 Squamous blepharitis right upper eyelid: Secondary | ICD-10-CM | POA: Diagnosis not present

## 2016-11-19 DIAGNOSIS — H4063X0 Glaucoma secondary to drugs, bilateral, stage unspecified: Secondary | ICD-10-CM | POA: Diagnosis not present

## 2016-11-19 DIAGNOSIS — H04123 Dry eye syndrome of bilateral lacrimal glands: Secondary | ICD-10-CM | POA: Diagnosis not present

## 2016-11-19 DIAGNOSIS — D89813 Graft-versus-host disease, unspecified: Secondary | ICD-10-CM | POA: Diagnosis not present

## 2016-11-29 ENCOUNTER — Encounter: Payer: Self-pay | Admitting: Family Medicine

## 2016-11-29 ENCOUNTER — Ambulatory Visit (INDEPENDENT_AMBULATORY_CARE_PROVIDER_SITE_OTHER): Payer: BLUE CROSS/BLUE SHIELD | Admitting: Family Medicine

## 2016-11-29 ENCOUNTER — Telehealth: Payer: Self-pay | Admitting: Family Medicine

## 2016-11-29 VITALS — BP 100/70 | HR 66 | Wt 157.0 lb

## 2016-11-29 DIAGNOSIS — L988 Other specified disorders of the skin and subcutaneous tissue: Secondary | ICD-10-CM | POA: Insufficient documentation

## 2016-11-29 DIAGNOSIS — C9001 Multiple myeloma in remission: Secondary | ICD-10-CM

## 2016-11-29 DIAGNOSIS — D89813 Graft-versus-host disease, unspecified: Secondary | ICD-10-CM

## 2016-11-29 DIAGNOSIS — L989 Disorder of the skin and subcutaneous tissue, unspecified: Secondary | ICD-10-CM

## 2016-11-29 DIAGNOSIS — N529 Male erectile dysfunction, unspecified: Secondary | ICD-10-CM

## 2016-11-29 DIAGNOSIS — L309 Dermatitis, unspecified: Secondary | ICD-10-CM

## 2016-11-29 MED ORDER — TRIAMCINOLONE ACETONIDE 0.1 % EX CREA: 1.0000 "application " | TOPICAL_CREAM | Freq: Two times a day (BID) | CUTANEOUS | 0 refills | Status: DC

## 2016-11-29 MED ORDER — SILDENAFIL CITRATE 20 MG PO TABS: ORAL_TABLET | ORAL | 5 refills | Status: DC

## 2016-11-29 MED ORDER — SILDENAFIL CITRATE 20 MG PO TABS: 20.0000 mg | ORAL_TABLET | Freq: Three times a day (TID) | ORAL | 5 refills | Status: DC

## 2016-11-29 NOTE — Telephone Encounter (Signed)
Pt said [name] is the cheaper pharmacy. Wants meds sent to [facility] and will need to get #100 of all meds in order to get a good rate.

## 2016-11-29 NOTE — Progress Notes (Signed)
   Subjective:    Patient ID: [name], male    DOB: [birth year], 56 y.o.   MRN: [id number]  HPI  He is here for evaluation of a rash present mainly on the left side of his face. 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 This was evaluated at [facility] and he states they said the rash was not related to it. He does have a previous history of this which apparently did respond to steroid creams. His causing some slight itching mainly on the left side of his face. He would also like a refill on his sildenafil. He states that it is working well and is having no difficulty with that.   Review of Systems     Objective:   Physical Exam Alert and in no distress. Exam of the face on the left does show slight erythema but it is not warm or tender. No vesicles or other lesions noted.       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 He'll keep me informed as to health working. Discussed the use of sildenafil with him. Apparently he is close to finishing up the graft-versus-host treatment at [facility] and they did recommend he follow-up here for general medical care. He has been seeing a cardiologist at [facility] and apparently the cardiac status is stable. I have been getting regular follow-up notes from [facility] through [facility]. The last several notes were reviewed.

## 2016-12-06 DIAGNOSIS — D89811 Chronic graft-versus-host disease: Secondary | ICD-10-CM | POA: Diagnosis not present

## 2016-12-06 DIAGNOSIS — Z5181 Encounter for therapeutic drug level monitoring: Secondary | ICD-10-CM | POA: Diagnosis not present

## 2016-12-06 DIAGNOSIS — L989 Disorder of the skin and subcutaneous tissue, unspecified: Secondary | ICD-10-CM | POA: Diagnosis not present

## 2016-12-06 DIAGNOSIS — T865 Complications of stem cell transplant: Secondary | ICD-10-CM | POA: Diagnosis not present

## 2016-12-06 DIAGNOSIS — D89813 Graft-versus-host disease, unspecified: Secondary | ICD-10-CM | POA: Diagnosis not present

## 2016-12-06 DIAGNOSIS — Z9481 Bone marrow transplant status: Secondary | ICD-10-CM | POA: Diagnosis not present

## 2016-12-06 DIAGNOSIS — Y834 Other reconstructive surgery as the cause of abnormal reaction of the patient, or of later complication, without mention of misadventure at the time of the procedure: Secondary | ICD-10-CM | POA: Diagnosis not present

## 2016-12-06 DIAGNOSIS — C9000 Multiple myeloma not having achieved remission: Secondary | ICD-10-CM | POA: Diagnosis not present

## 2016-12-07 DIAGNOSIS — T8609 Other complications of bone marrow transplant: Secondary | ICD-10-CM | POA: Diagnosis not present

## 2016-12-07 DIAGNOSIS — Z9481 Bone marrow transplant status: Secondary | ICD-10-CM | POA: Diagnosis not present

## 2016-12-07 DIAGNOSIS — D89813 Graft-versus-host disease, unspecified: Secondary | ICD-10-CM | POA: Diagnosis not present

## 2016-12-07 DIAGNOSIS — Y848 Other medical procedures as the cause of abnormal reaction of the patient, or of later complication, without mention of misadventure at the time of the procedure: Secondary | ICD-10-CM | POA: Diagnosis not present

## 2016-12-07 DIAGNOSIS — D89811 Chronic graft-versus-host disease: Secondary | ICD-10-CM | POA: Diagnosis not present

## 2016-12-07 DIAGNOSIS — C9000 Multiple myeloma not having achieved remission: Secondary | ICD-10-CM | POA: Diagnosis not present

## 2016-12-27 DIAGNOSIS — Y849 Medical procedure, unspecified as the cause of abnormal reaction of the patient, or of later complication, without mention of misadventure at the time of the procedure: Secondary | ICD-10-CM | POA: Diagnosis not present

## 2016-12-27 DIAGNOSIS — D89811 Chronic graft-versus-host disease: Secondary | ICD-10-CM | POA: Diagnosis not present

## 2016-12-27 DIAGNOSIS — C84A9 Cutaneous T-cell lymphoma, unspecified, extranodal and solid organ sites: Secondary | ICD-10-CM | POA: Diagnosis not present

## 2016-12-27 DIAGNOSIS — L989 Disorder of the skin and subcutaneous tissue, unspecified: Secondary | ICD-10-CM | POA: Diagnosis not present

## 2016-12-27 DIAGNOSIS — C9000 Multiple myeloma not having achieved remission: Secondary | ICD-10-CM | POA: Diagnosis not present

## 2016-12-27 DIAGNOSIS — T8609 Other complications of bone marrow transplant: Secondary | ICD-10-CM | POA: Diagnosis not present

## 2016-12-27 DIAGNOSIS — D89813 Graft-versus-host disease, unspecified: Secondary | ICD-10-CM | POA: Diagnosis not present

## 2016-12-27 DIAGNOSIS — H04123 Dry eye syndrome of bilateral lacrimal glands: Secondary | ICD-10-CM | POA: Diagnosis not present

## 2016-12-28 DIAGNOSIS — C9000 Multiple myeloma not having achieved remission: Secondary | ICD-10-CM | POA: Diagnosis not present

## 2016-12-28 DIAGNOSIS — Z9481 Bone marrow transplant status: Secondary | ICD-10-CM | POA: Diagnosis not present

## 2016-12-28 DIAGNOSIS — D89813 Graft-versus-host disease, unspecified: Secondary | ICD-10-CM | POA: Diagnosis not present

## 2016-12-28 DIAGNOSIS — L989 Disorder of the skin and subcutaneous tissue, unspecified: Secondary | ICD-10-CM | POA: Diagnosis not present

## 2017-01-24 DIAGNOSIS — I2510 Atherosclerotic heart disease of native coronary artery without angina pectoris: Secondary | ICD-10-CM | POA: Diagnosis not present

## 2017-01-24 DIAGNOSIS — T380X5A Adverse effect of glucocorticoids and synthetic analogues, initial encounter: Secondary | ICD-10-CM | POA: Diagnosis not present

## 2017-01-24 DIAGNOSIS — C9000 Multiple myeloma not having achieved remission: Secondary | ICD-10-CM | POA: Diagnosis not present

## 2017-01-24 DIAGNOSIS — L989 Disorder of the skin and subcutaneous tissue, unspecified: Secondary | ICD-10-CM | POA: Diagnosis not present

## 2017-01-24 DIAGNOSIS — D89813 Graft-versus-host disease, unspecified: Secondary | ICD-10-CM | POA: Diagnosis not present

## 2017-01-24 DIAGNOSIS — T8609 Other complications of bone marrow transplant: Secondary | ICD-10-CM | POA: Diagnosis not present

## 2017-01-24 DIAGNOSIS — D89811 Chronic graft-versus-host disease: Secondary | ICD-10-CM | POA: Diagnosis not present

## 2017-01-24 DIAGNOSIS — T451X5A Adverse effect of antineoplastic and immunosuppressive drugs, initial encounter: Secondary | ICD-10-CM | POA: Diagnosis not present

## 2017-01-24 DIAGNOSIS — Y832 Surgical operation with anastomosis, bypass or graft as the cause of abnormal reaction of the patient, or of later complication, without mention of misadventure at the time of the procedure: Secondary | ICD-10-CM | POA: Diagnosis not present

## 2017-01-24 DIAGNOSIS — D509 Iron deficiency anemia, unspecified: Secondary | ICD-10-CM | POA: Diagnosis not present

## 2017-01-24 DIAGNOSIS — Z951 Presence of aortocoronary bypass graft: Secondary | ICD-10-CM | POA: Diagnosis not present

## 2017-01-24 DIAGNOSIS — G620 Drug-induced polyneuropathy: Secondary | ICD-10-CM | POA: Diagnosis not present

## 2017-01-24 DIAGNOSIS — M109 Gout, unspecified: Secondary | ICD-10-CM | POA: Diagnosis not present

## 2017-01-24 DIAGNOSIS — Z9481 Bone marrow transplant status: Secondary | ICD-10-CM | POA: Diagnosis not present

## 2017-01-24 DIAGNOSIS — R739 Hyperglycemia, unspecified: Secondary | ICD-10-CM | POA: Diagnosis not present

## 2017-02-06 DIAGNOSIS — H01022 Squamous blepharitis right lower eyelid: Secondary | ICD-10-CM | POA: Diagnosis not present

## 2017-02-06 DIAGNOSIS — H01021 Squamous blepharitis right upper eyelid: Secondary | ICD-10-CM | POA: Diagnosis not present

## 2017-02-06 DIAGNOSIS — H01024 Squamous blepharitis left upper eyelid: Secondary | ICD-10-CM | POA: Diagnosis not present

## 2017-02-06 DIAGNOSIS — H16223 Keratoconjunctivitis sicca, not specified as Sjogren's, bilateral: Secondary | ICD-10-CM | POA: Diagnosis not present

## 2017-02-14 DIAGNOSIS — C9000 Multiple myeloma not having achieved remission: Secondary | ICD-10-CM | POA: Diagnosis not present

## 2017-02-14 DIAGNOSIS — L989 Disorder of the skin and subcutaneous tissue, unspecified: Secondary | ICD-10-CM | POA: Diagnosis not present

## 2017-02-14 DIAGNOSIS — Z9481 Bone marrow transplant status: Secondary | ICD-10-CM | POA: Diagnosis not present

## 2017-02-14 DIAGNOSIS — D89813 Graft-versus-host disease, unspecified: Secondary | ICD-10-CM | POA: Diagnosis not present

## 2017-02-14 DIAGNOSIS — Z5181 Encounter for therapeutic drug level monitoring: Secondary | ICD-10-CM | POA: Diagnosis not present

## 2017-03-07 DIAGNOSIS — Z79899 Other long term (current) drug therapy: Secondary | ICD-10-CM | POA: Diagnosis not present

## 2017-03-07 DIAGNOSIS — C9000 Multiple myeloma not having achieved remission: Secondary | ICD-10-CM | POA: Diagnosis not present

## 2017-03-07 DIAGNOSIS — T8609 Other complications of bone marrow transplant: Secondary | ICD-10-CM | POA: Diagnosis not present

## 2017-03-07 DIAGNOSIS — D89811 Chronic graft-versus-host disease: Secondary | ICD-10-CM | POA: Diagnosis not present

## 2017-03-07 DIAGNOSIS — D89813 Graft-versus-host disease, unspecified: Secondary | ICD-10-CM | POA: Diagnosis not present

## 2017-03-07 DIAGNOSIS — L989 Disorder of the skin and subcutaneous tissue, unspecified: Secondary | ICD-10-CM | POA: Diagnosis not present

## 2017-03-07 DIAGNOSIS — Z5181 Encounter for therapeutic drug level monitoring: Secondary | ICD-10-CM | POA: Diagnosis not present

## 2017-04-09 DIAGNOSIS — D89813 Graft-versus-host disease, unspecified: Secondary | ICD-10-CM | POA: Diagnosis not present

## 2017-04-09 DIAGNOSIS — Z452 Encounter for adjustment and management of vascular access device: Secondary | ICD-10-CM | POA: Diagnosis not present

## 2017-04-09 DIAGNOSIS — L989 Disorder of the skin and subcutaneous tissue, unspecified: Secondary | ICD-10-CM | POA: Diagnosis not present

## 2017-05-06 DIAGNOSIS — Z9481 Bone marrow transplant status: Secondary | ICD-10-CM | POA: Diagnosis not present

## 2017-05-06 DIAGNOSIS — C9000 Multiple myeloma not having achieved remission: Secondary | ICD-10-CM | POA: Diagnosis not present

## 2017-05-06 DIAGNOSIS — B258 Other cytomegaloviral diseases: Secondary | ICD-10-CM | POA: Diagnosis not present

## 2017-05-09 DIAGNOSIS — Z9481 Bone marrow transplant status: Secondary | ICD-10-CM | POA: Diagnosis not present

## 2017-05-09 DIAGNOSIS — C9001 Multiple myeloma in remission: Secondary | ICD-10-CM | POA: Diagnosis not present

## 2017-05-13 ENCOUNTER — Other Ambulatory Visit (INDEPENDENT_AMBULATORY_CARE_PROVIDER_SITE_OTHER): Payer: BLUE CROSS/BLUE SHIELD

## 2017-05-13 DIAGNOSIS — Z23 Encounter for immunization: Secondary | ICD-10-CM | POA: Diagnosis not present

## 2017-06-19 DIAGNOSIS — H40053 Ocular hypertension, bilateral: Secondary | ICD-10-CM | POA: Diagnosis not present

## 2017-06-19 DIAGNOSIS — H1131 Conjunctival hemorrhage, right eye: Secondary | ICD-10-CM | POA: Diagnosis not present

## 2017-07-01 DIAGNOSIS — H01025 Squamous blepharitis left lower eyelid: Secondary | ICD-10-CM | POA: Diagnosis not present

## 2017-07-01 DIAGNOSIS — H01021 Squamous blepharitis right upper eyelid: Secondary | ICD-10-CM | POA: Diagnosis not present

## 2017-07-01 DIAGNOSIS — H01024 Squamous blepharitis left upper eyelid: Secondary | ICD-10-CM | POA: Diagnosis not present

## 2017-07-01 DIAGNOSIS — H01022 Squamous blepharitis right lower eyelid: Secondary | ICD-10-CM | POA: Diagnosis not present

## 2017-07-03 DIAGNOSIS — Z79899 Other long term (current) drug therapy: Secondary | ICD-10-CM | POA: Diagnosis not present

## 2017-07-03 DIAGNOSIS — C9000 Multiple myeloma not having achieved remission: Secondary | ICD-10-CM | POA: Diagnosis not present

## 2017-07-03 DIAGNOSIS — Z7982 Long term (current) use of aspirin: Secondary | ICD-10-CM | POA: Diagnosis not present

## 2017-07-03 DIAGNOSIS — C9001 Multiple myeloma in remission: Secondary | ICD-10-CM | POA: Diagnosis not present

## 2017-07-03 DIAGNOSIS — T865 Complications of stem cell transplant: Secondary | ICD-10-CM | POA: Diagnosis not present

## 2017-07-03 DIAGNOSIS — Z23 Encounter for immunization: Secondary | ICD-10-CM | POA: Diagnosis not present

## 2017-07-03 DIAGNOSIS — R739 Hyperglycemia, unspecified: Secondary | ICD-10-CM | POA: Diagnosis not present

## 2017-07-03 DIAGNOSIS — D89811 Chronic graft-versus-host disease: Secondary | ICD-10-CM | POA: Diagnosis not present

## 2017-07-03 DIAGNOSIS — T380X5A Adverse effect of glucocorticoids and synthetic analogues, initial encounter: Secondary | ICD-10-CM | POA: Diagnosis not present

## 2017-07-03 DIAGNOSIS — M109 Gout, unspecified: Secondary | ICD-10-CM | POA: Diagnosis not present

## 2017-07-03 DIAGNOSIS — R21 Rash and other nonspecific skin eruption: Secondary | ICD-10-CM | POA: Diagnosis not present

## 2017-07-03 DIAGNOSIS — G629 Polyneuropathy, unspecified: Secondary | ICD-10-CM | POA: Diagnosis not present

## 2017-07-03 DIAGNOSIS — Z951 Presence of aortocoronary bypass graft: Secondary | ICD-10-CM | POA: Diagnosis not present

## 2017-07-03 DIAGNOSIS — K1370 Unspecified lesions of oral mucosa: Secondary | ICD-10-CM | POA: Diagnosis not present

## 2017-07-03 DIAGNOSIS — Y834 Other reconstructive surgery as the cause of abnormal reaction of the patient, or of later complication, without mention of misadventure at the time of the procedure: Secondary | ICD-10-CM | POA: Diagnosis not present

## 2017-07-03 DIAGNOSIS — Z9481 Bone marrow transplant status: Secondary | ICD-10-CM | POA: Diagnosis not present

## 2017-07-03 DIAGNOSIS — D509 Iron deficiency anemia, unspecified: Secondary | ICD-10-CM | POA: Diagnosis not present

## 2017-07-04 DIAGNOSIS — Z9484 Stem cells transplant status: Secondary | ICD-10-CM | POA: Diagnosis not present

## 2017-07-04 DIAGNOSIS — I359 Nonrheumatic aortic valve disorder, unspecified: Secondary | ICD-10-CM | POA: Diagnosis not present

## 2017-07-04 DIAGNOSIS — I059 Rheumatic mitral valve disease, unspecified: Secondary | ICD-10-CM | POA: Diagnosis not present

## 2017-07-04 DIAGNOSIS — I2510 Atherosclerotic heart disease of native coronary artery without angina pectoris: Secondary | ICD-10-CM | POA: Diagnosis not present

## 2017-07-04 DIAGNOSIS — C9000 Multiple myeloma not having achieved remission: Secondary | ICD-10-CM | POA: Diagnosis not present

## 2017-07-04 DIAGNOSIS — Z951 Presence of aortocoronary bypass graft: Secondary | ICD-10-CM | POA: Diagnosis not present

## 2017-07-04 DIAGNOSIS — I071 Rheumatic tricuspid insufficiency: Secondary | ICD-10-CM | POA: Diagnosis not present

## 2017-07-04 DIAGNOSIS — I351 Nonrheumatic aortic (valve) insufficiency: Secondary | ICD-10-CM | POA: Diagnosis not present

## 2017-07-04 DIAGNOSIS — I340 Nonrheumatic mitral (valve) insufficiency: Secondary | ICD-10-CM | POA: Diagnosis not present

## 2017-07-29 DIAGNOSIS — C9001 Multiple myeloma in remission: Secondary | ICD-10-CM | POA: Diagnosis not present

## 2017-07-29 DIAGNOSIS — Z9481 Bone marrow transplant status: Secondary | ICD-10-CM | POA: Diagnosis not present

## 2017-08-28 DIAGNOSIS — H16223 Keratoconjunctivitis sicca, not specified as Sjogren's, bilateral: Secondary | ICD-10-CM | POA: Diagnosis not present

## 2017-08-28 DIAGNOSIS — H4063X1 Glaucoma secondary to drugs, bilateral, mild stage: Secondary | ICD-10-CM | POA: Diagnosis not present

## 2017-08-28 DIAGNOSIS — H40053 Ocular hypertension, bilateral: Secondary | ICD-10-CM | POA: Diagnosis not present

## 2017-08-28 DIAGNOSIS — H01021 Squamous blepharitis right upper eyelid: Secondary | ICD-10-CM | POA: Diagnosis not present

## 2017-09-04 DIAGNOSIS — Z9481 Bone marrow transplant status: Secondary | ICD-10-CM | POA: Diagnosis not present

## 2017-09-04 DIAGNOSIS — R1319 Other dysphagia: Secondary | ICD-10-CM | POA: Diagnosis not present

## 2017-09-04 DIAGNOSIS — C9000 Multiple myeloma not having achieved remission: Secondary | ICD-10-CM | POA: Diagnosis not present

## 2017-09-04 DIAGNOSIS — C9001 Multiple myeloma in remission: Secondary | ICD-10-CM | POA: Diagnosis not present

## 2017-09-11 DIAGNOSIS — H01022 Squamous blepharitis right lower eyelid: Secondary | ICD-10-CM | POA: Diagnosis not present

## 2017-09-11 DIAGNOSIS — H40053 Ocular hypertension, bilateral: Secondary | ICD-10-CM | POA: Diagnosis not present

## 2017-09-11 DIAGNOSIS — H01021 Squamous blepharitis right upper eyelid: Secondary | ICD-10-CM | POA: Diagnosis not present

## 2017-09-11 DIAGNOSIS — H16223 Keratoconjunctivitis sicca, not specified as Sjogren's, bilateral: Secondary | ICD-10-CM | POA: Diagnosis not present

## 2017-09-25 DIAGNOSIS — H01021 Squamous blepharitis right upper eyelid: Secondary | ICD-10-CM | POA: Diagnosis not present

## 2017-09-25 DIAGNOSIS — H16223 Keratoconjunctivitis sicca, not specified as Sjogren's, bilateral: Secondary | ICD-10-CM | POA: Diagnosis not present

## 2017-09-25 DIAGNOSIS — H40053 Ocular hypertension, bilateral: Secondary | ICD-10-CM | POA: Diagnosis not present

## 2017-09-25 DIAGNOSIS — H01022 Squamous blepharitis right lower eyelid: Secondary | ICD-10-CM | POA: Diagnosis not present

## 2017-10-01 DIAGNOSIS — R634 Abnormal weight loss: Secondary | ICD-10-CM | POA: Diagnosis not present

## 2017-10-01 DIAGNOSIS — R1310 Dysphagia, unspecified: Secondary | ICD-10-CM | POA: Diagnosis not present

## 2017-10-01 DIAGNOSIS — K219 Gastro-esophageal reflux disease without esophagitis: Secondary | ICD-10-CM | POA: Diagnosis not present

## 2017-10-01 DIAGNOSIS — K5904 Chronic idiopathic constipation: Secondary | ICD-10-CM | POA: Diagnosis not present

## 2017-10-02 DIAGNOSIS — Z23 Encounter for immunization: Secondary | ICD-10-CM | POA: Diagnosis not present

## 2017-10-02 DIAGNOSIS — Z9481 Bone marrow transplant status: Secondary | ICD-10-CM | POA: Diagnosis not present

## 2017-10-02 DIAGNOSIS — C9001 Multiple myeloma in remission: Secondary | ICD-10-CM | POA: Diagnosis not present

## 2017-10-29 DIAGNOSIS — K5904 Chronic idiopathic constipation: Secondary | ICD-10-CM | POA: Diagnosis not present

## 2017-10-29 DIAGNOSIS — R1310 Dysphagia, unspecified: Secondary | ICD-10-CM | POA: Diagnosis not present

## 2017-10-29 DIAGNOSIS — Z1211 Encounter for screening for malignant neoplasm of colon: Secondary | ICD-10-CM | POA: Diagnosis not present

## 2017-10-29 DIAGNOSIS — K219 Gastro-esophageal reflux disease without esophagitis: Secondary | ICD-10-CM | POA: Diagnosis not present

## 2017-10-30 DIAGNOSIS — Z9481 Bone marrow transplant status: Secondary | ICD-10-CM | POA: Diagnosis not present

## 2017-10-30 DIAGNOSIS — C9001 Multiple myeloma in remission: Secondary | ICD-10-CM | POA: Diagnosis not present

## 2017-10-30 DIAGNOSIS — D89811 Chronic graft-versus-host disease: Secondary | ICD-10-CM | POA: Diagnosis not present

## 2017-11-04 DIAGNOSIS — R1310 Dysphagia, unspecified: Secondary | ICD-10-CM | POA: Diagnosis not present

## 2017-11-04 DIAGNOSIS — K219 Gastro-esophageal reflux disease without esophagitis: Secondary | ICD-10-CM | POA: Diagnosis not present

## 2017-11-04 DIAGNOSIS — K449 Diaphragmatic hernia without obstruction or gangrene: Secondary | ICD-10-CM | POA: Diagnosis not present

## 2017-11-04 DIAGNOSIS — Z1211 Encounter for screening for malignant neoplasm of colon: Secondary | ICD-10-CM | POA: Diagnosis not present

## 2017-11-04 LAB — HM COLONOSCOPY

## 2017-11-06 DIAGNOSIS — H40053 Ocular hypertension, bilateral: Secondary | ICD-10-CM | POA: Diagnosis not present

## 2017-11-06 DIAGNOSIS — H16223 Keratoconjunctivitis sicca, not specified as Sjogren's, bilateral: Secondary | ICD-10-CM | POA: Diagnosis not present

## 2017-11-06 DIAGNOSIS — H01022 Squamous blepharitis right lower eyelid: Secondary | ICD-10-CM | POA: Diagnosis not present

## 2017-11-19 DIAGNOSIS — H16223 Keratoconjunctivitis sicca, not specified as Sjogren's, bilateral: Secondary | ICD-10-CM | POA: Diagnosis not present

## 2017-11-19 DIAGNOSIS — H01022 Squamous blepharitis right lower eyelid: Secondary | ICD-10-CM | POA: Diagnosis not present

## 2017-11-19 DIAGNOSIS — H40053 Ocular hypertension, bilateral: Secondary | ICD-10-CM | POA: Diagnosis not present

## 2017-11-19 DIAGNOSIS — H01021 Squamous blepharitis right upper eyelid: Secondary | ICD-10-CM | POA: Diagnosis not present

## 2017-11-25 DIAGNOSIS — H01022 Squamous blepharitis right lower eyelid: Secondary | ICD-10-CM | POA: Diagnosis not present

## 2017-11-25 DIAGNOSIS — H16223 Keratoconjunctivitis sicca, not specified as Sjogren's, bilateral: Secondary | ICD-10-CM | POA: Diagnosis not present

## 2017-11-25 DIAGNOSIS — H01024 Squamous blepharitis left upper eyelid: Secondary | ICD-10-CM | POA: Diagnosis not present

## 2017-11-25 DIAGNOSIS — H01021 Squamous blepharitis right upper eyelid: Secondary | ICD-10-CM | POA: Diagnosis not present

## 2017-12-03 DIAGNOSIS — K5904 Chronic idiopathic constipation: Secondary | ICD-10-CM | POA: Diagnosis not present

## 2017-12-03 DIAGNOSIS — K219 Gastro-esophageal reflux disease without esophagitis: Secondary | ICD-10-CM | POA: Diagnosis not present

## 2017-12-03 DIAGNOSIS — R1310 Dysphagia, unspecified: Secondary | ICD-10-CM | POA: Diagnosis not present

## 2017-12-06 DIAGNOSIS — Z9481 Bone marrow transplant status: Secondary | ICD-10-CM | POA: Diagnosis not present

## 2017-12-06 DIAGNOSIS — C9001 Multiple myeloma in remission: Secondary | ICD-10-CM | POA: Diagnosis not present

## 2017-12-23 DIAGNOSIS — H16223 Keratoconjunctivitis sicca, not specified as Sjogren's, bilateral: Secondary | ICD-10-CM | POA: Diagnosis not present

## 2017-12-23 DIAGNOSIS — H01024 Squamous blepharitis left upper eyelid: Secondary | ICD-10-CM | POA: Diagnosis not present

## 2017-12-23 DIAGNOSIS — H01021 Squamous blepharitis right upper eyelid: Secondary | ICD-10-CM | POA: Diagnosis not present

## 2018-01-02 DIAGNOSIS — D89811 Chronic graft-versus-host disease: Secondary | ICD-10-CM | POA: Diagnosis not present

## 2018-01-02 DIAGNOSIS — C9000 Multiple myeloma not having achieved remission: Secondary | ICD-10-CM | POA: Diagnosis not present

## 2018-01-02 DIAGNOSIS — Z9481 Bone marrow transplant status: Secondary | ICD-10-CM | POA: Diagnosis not present

## 2018-01-02 DIAGNOSIS — I2510 Atherosclerotic heart disease of native coronary artery without angina pectoris: Secondary | ICD-10-CM | POA: Diagnosis not present

## 2018-01-02 DIAGNOSIS — C9001 Multiple myeloma in remission: Secondary | ICD-10-CM | POA: Diagnosis not present

## 2018-01-14 ENCOUNTER — Encounter: Payer: BLUE CROSS/BLUE SHIELD | Admitting: Family Medicine

## 2018-01-16 DIAGNOSIS — Z9481 Bone marrow transplant status: Secondary | ICD-10-CM | POA: Diagnosis not present

## 2018-01-16 DIAGNOSIS — D89811 Chronic graft-versus-host disease: Secondary | ICD-10-CM | POA: Diagnosis not present

## 2018-01-16 DIAGNOSIS — C9001 Multiple myeloma in remission: Secondary | ICD-10-CM | POA: Diagnosis not present

## 2018-01-16 DIAGNOSIS — C9000 Multiple myeloma not having achieved remission: Secondary | ICD-10-CM | POA: Diagnosis not present

## 2018-01-30 DIAGNOSIS — Z9481 Bone marrow transplant status: Secondary | ICD-10-CM | POA: Diagnosis not present

## 2018-01-30 DIAGNOSIS — C9000 Multiple myeloma not having achieved remission: Secondary | ICD-10-CM | POA: Diagnosis not present

## 2018-01-30 DIAGNOSIS — C9001 Multiple myeloma in remission: Secondary | ICD-10-CM | POA: Diagnosis not present

## 2018-01-30 DIAGNOSIS — D89811 Chronic graft-versus-host disease: Secondary | ICD-10-CM | POA: Diagnosis not present

## 2018-02-03 ENCOUNTER — Encounter: Payer: Self-pay | Admitting: Family Medicine

## 2018-02-10 DIAGNOSIS — C9001 Multiple myeloma in remission: Secondary | ICD-10-CM | POA: Diagnosis not present

## 2018-02-10 DIAGNOSIS — Z9481 Bone marrow transplant status: Secondary | ICD-10-CM | POA: Diagnosis not present

## 2018-02-10 DIAGNOSIS — D89811 Chronic graft-versus-host disease: Secondary | ICD-10-CM | POA: Diagnosis not present

## 2018-02-27 DIAGNOSIS — Z9481 Bone marrow transplant status: Secondary | ICD-10-CM | POA: Diagnosis not present

## 2018-02-27 DIAGNOSIS — T380X5A Adverse effect of glucocorticoids and synthetic analogues, initial encounter: Secondary | ICD-10-CM | POA: Diagnosis not present

## 2018-02-27 DIAGNOSIS — C9000 Multiple myeloma not having achieved remission: Secondary | ICD-10-CM | POA: Diagnosis not present

## 2018-02-27 DIAGNOSIS — R0602 Shortness of breath: Secondary | ICD-10-CM | POA: Diagnosis not present

## 2018-02-27 DIAGNOSIS — R739 Hyperglycemia, unspecified: Secondary | ICD-10-CM | POA: Diagnosis not present

## 2018-02-27 DIAGNOSIS — D509 Iron deficiency anemia, unspecified: Secondary | ICD-10-CM | POA: Diagnosis not present

## 2018-02-27 DIAGNOSIS — I2510 Atherosclerotic heart disease of native coronary artery without angina pectoris: Secondary | ICD-10-CM | POA: Diagnosis not present

## 2018-02-27 DIAGNOSIS — R252 Cramp and spasm: Secondary | ICD-10-CM | POA: Diagnosis not present

## 2018-02-27 DIAGNOSIS — T865 Complications of stem cell transplant: Secondary | ICD-10-CM | POA: Diagnosis not present

## 2018-02-27 DIAGNOSIS — H04123 Dry eye syndrome of bilateral lacrimal glands: Secondary | ICD-10-CM | POA: Diagnosis not present

## 2018-02-27 DIAGNOSIS — D89811 Chronic graft-versus-host disease: Secondary | ICD-10-CM | POA: Diagnosis not present

## 2018-02-27 DIAGNOSIS — R05 Cough: Secondary | ICD-10-CM | POA: Diagnosis not present

## 2018-02-27 DIAGNOSIS — K1370 Unspecified lesions of oral mucosa: Secondary | ICD-10-CM | POA: Diagnosis not present

## 2018-02-27 DIAGNOSIS — G629 Polyneuropathy, unspecified: Secondary | ICD-10-CM | POA: Diagnosis not present

## 2018-02-27 DIAGNOSIS — R1310 Dysphagia, unspecified: Secondary | ICD-10-CM | POA: Diagnosis not present

## 2018-02-27 DIAGNOSIS — Z86718 Personal history of other venous thrombosis and embolism: Secondary | ICD-10-CM | POA: Diagnosis not present

## 2018-02-27 DIAGNOSIS — Z951 Presence of aortocoronary bypass graft: Secondary | ICD-10-CM | POA: Diagnosis not present

## 2018-03-17 DIAGNOSIS — H01024 Squamous blepharitis left upper eyelid: Secondary | ICD-10-CM | POA: Diagnosis not present

## 2018-03-17 DIAGNOSIS — H16223 Keratoconjunctivitis sicca, not specified as Sjogren's, bilateral: Secondary | ICD-10-CM | POA: Diagnosis not present

## 2018-03-17 DIAGNOSIS — H01021 Squamous blepharitis right upper eyelid: Secondary | ICD-10-CM | POA: Diagnosis not present

## 2018-03-17 DIAGNOSIS — H01022 Squamous blepharitis right lower eyelid: Secondary | ICD-10-CM | POA: Diagnosis not present

## 2018-03-19 DIAGNOSIS — R1310 Dysphagia, unspecified: Secondary | ICD-10-CM | POA: Diagnosis not present

## 2018-03-19 DIAGNOSIS — Z8579 Personal history of other malignant neoplasms of lymphoid, hematopoietic and related tissues: Secondary | ICD-10-CM | POA: Diagnosis not present

## 2018-03-19 DIAGNOSIS — Z951 Presence of aortocoronary bypass graft: Secondary | ICD-10-CM | POA: Diagnosis not present

## 2018-03-19 DIAGNOSIS — T380X5A Adverse effect of glucocorticoids and synthetic analogues, initial encounter: Secondary | ICD-10-CM | POA: Diagnosis not present

## 2018-03-19 DIAGNOSIS — G629 Polyneuropathy, unspecified: Secondary | ICD-10-CM | POA: Diagnosis not present

## 2018-03-19 DIAGNOSIS — T865 Complications of stem cell transplant: Secondary | ICD-10-CM | POA: Diagnosis not present

## 2018-03-19 DIAGNOSIS — M109 Gout, unspecified: Secondary | ICD-10-CM | POA: Diagnosis not present

## 2018-03-19 DIAGNOSIS — I2510 Atherosclerotic heart disease of native coronary artery without angina pectoris: Secondary | ICD-10-CM | POA: Diagnosis not present

## 2018-03-19 DIAGNOSIS — Y830 Surgical operation with transplant of whole organ as the cause of abnormal reaction of the patient, or of later complication, without mention of misadventure at the time of the procedure: Secondary | ICD-10-CM | POA: Diagnosis not present

## 2018-03-19 DIAGNOSIS — Z08 Encounter for follow-up examination after completed treatment for malignant neoplasm: Secondary | ICD-10-CM | POA: Diagnosis not present

## 2018-03-19 DIAGNOSIS — D509 Iron deficiency anemia, unspecified: Secondary | ICD-10-CM | POA: Diagnosis not present

## 2018-03-19 DIAGNOSIS — D89811 Chronic graft-versus-host disease: Secondary | ICD-10-CM | POA: Diagnosis not present

## 2018-03-19 DIAGNOSIS — R739 Hyperglycemia, unspecified: Secondary | ICD-10-CM | POA: Diagnosis not present

## 2018-03-19 DIAGNOSIS — C9000 Multiple myeloma not having achieved remission: Secondary | ICD-10-CM | POA: Diagnosis not present

## 2018-03-19 DIAGNOSIS — Z9481 Bone marrow transplant status: Secondary | ICD-10-CM | POA: Diagnosis not present

## 2018-03-26 DIAGNOSIS — C9001 Multiple myeloma in remission: Secondary | ICD-10-CM | POA: Diagnosis not present

## 2018-03-26 DIAGNOSIS — D89811 Chronic graft-versus-host disease: Secondary | ICD-10-CM | POA: Diagnosis not present

## 2018-03-26 DIAGNOSIS — C9000 Multiple myeloma not having achieved remission: Secondary | ICD-10-CM | POA: Diagnosis not present

## 2018-03-26 DIAGNOSIS — Z9481 Bone marrow transplant status: Secondary | ICD-10-CM | POA: Diagnosis not present

## 2018-04-02 DIAGNOSIS — D89811 Chronic graft-versus-host disease: Secondary | ICD-10-CM | POA: Diagnosis not present

## 2018-04-02 DIAGNOSIS — C9000 Multiple myeloma not having achieved remission: Secondary | ICD-10-CM | POA: Diagnosis not present

## 2018-04-02 DIAGNOSIS — Z9481 Bone marrow transplant status: Secondary | ICD-10-CM | POA: Diagnosis not present

## 2018-04-02 DIAGNOSIS — R0602 Shortness of breath: Secondary | ICD-10-CM | POA: Diagnosis not present

## 2018-04-16 DIAGNOSIS — Z9481 Bone marrow transplant status: Secondary | ICD-10-CM | POA: Diagnosis not present

## 2018-04-16 DIAGNOSIS — C9001 Multiple myeloma in remission: Secondary | ICD-10-CM | POA: Diagnosis not present

## 2018-04-16 DIAGNOSIS — D89811 Chronic graft-versus-host disease: Secondary | ICD-10-CM | POA: Diagnosis not present

## 2018-04-16 DIAGNOSIS — Z006 Encounter for examination for normal comparison and control in clinical research program: Secondary | ICD-10-CM | POA: Diagnosis not present

## 2018-04-29 ENCOUNTER — Ambulatory Visit: Payer: BLUE CROSS/BLUE SHIELD | Admitting: Rehabilitation

## 2018-04-29 ENCOUNTER — Ambulatory Visit: Payer: BLUE CROSS/BLUE SHIELD | Admitting: Physical Therapy

## 2018-04-30 DIAGNOSIS — Y834 Other reconstructive surgery as the cause of abnormal reaction of the patient, or of later complication, without mention of misadventure at the time of the procedure: Secondary | ICD-10-CM | POA: Diagnosis not present

## 2018-04-30 DIAGNOSIS — L988 Other specified disorders of the skin and subcutaneous tissue: Secondary | ICD-10-CM | POA: Diagnosis not present

## 2018-04-30 DIAGNOSIS — Z006 Encounter for examination for normal comparison and control in clinical research program: Secondary | ICD-10-CM | POA: Diagnosis not present

## 2018-04-30 DIAGNOSIS — T865 Complications of stem cell transplant: Secondary | ICD-10-CM | POA: Diagnosis not present

## 2018-04-30 DIAGNOSIS — C9001 Multiple myeloma in remission: Secondary | ICD-10-CM | POA: Diagnosis not present

## 2018-04-30 DIAGNOSIS — Z9481 Bone marrow transplant status: Secondary | ICD-10-CM | POA: Diagnosis not present

## 2018-04-30 DIAGNOSIS — D89811 Chronic graft-versus-host disease: Secondary | ICD-10-CM | POA: Diagnosis not present

## 2018-04-30 DIAGNOSIS — D89813 Graft-versus-host disease, unspecified: Secondary | ICD-10-CM | POA: Diagnosis not present

## 2018-05-05 ENCOUNTER — Encounter: Payer: Self-pay | Admitting: Physical Therapy

## 2018-05-05 ENCOUNTER — Other Ambulatory Visit: Payer: Self-pay

## 2018-05-05 ENCOUNTER — Ambulatory Visit: Payer: BLUE CROSS/BLUE SHIELD | Attending: Internal Medicine | Admitting: Physical Therapy

## 2018-05-05 DIAGNOSIS — M6281 Muscle weakness (generalized): Secondary | ICD-10-CM

## 2018-05-05 DIAGNOSIS — R296 Repeated falls: Secondary | ICD-10-CM | POA: Diagnosis not present

## 2018-05-05 DIAGNOSIS — R262 Difficulty in walking, not elsewhere classified: Secondary | ICD-10-CM | POA: Insufficient documentation

## 2018-05-05 NOTE — Therapy (Addendum)
[place], [place], [address] Phone: [phone]   Fax:  [phone]  Physical Therapy Evaluation  Patient Details  Name: [name] MRN: [id number] Date of Birth: [birth year] Referring Provider (PT): Valli Glance   Encounter Date: 05/05/2018  PT End of Session - 05/05/18 1235    Visit Number  1    Number of Visits  9    Date for PT Re-Evaluation  06/02/18    PT Start Time  1022    PT Stop Time  1103    PT Time Calculation (min)  41 min    Activity Tolerance  Patient tolerated treatment well    Behavior During Therapy  [facility] for tasks assessed/performed       Past Medical History:  Diagnosis Date  . Anxiety   . Aortic valve insufficiency, senile calcific October 14   Moderate regurgitation, eccentric towards Ant MV Leaflet  . Bence Jones proteinuria 09/01/2012   Borderline increase protein 190 mg on 24 hr urine IFE with free kappa light chains (lab normal 50-100 mg)  08/18/12  . CAD in native artery    s/p CABG; -- Cards: Dr. Roni Bread, [facility]; [facility] 01/2013 EF 57%, No ischemia or Infarction.  . Erectile dysfunction   . Essential hypertension   . H/O cardiovascular stress test 02/22/10   normal perfusion, no ischemia or infarct; treadmill and Myoview perfusion study; Dr. Elisabeth Cara  . Hyperlipidemia LDL goal <70   . Leukopenia 09/01/2012   WBC 3,700 42 poly, 48 lymphs, 9 monos 08/18/12  Was 3,700 1 year ago  . 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 Moderate mitral regurgitation by prior echocardiogram October 2014   Echo 02/2013: Severe holosystolic Ant MV Leaflet prolapse, Moderate regurgitation.  Mildly dilated LV with normal systolic function (EF 80-32%), moderate LA dilation, upper normal PA pressures ~ 34 mmHg.  . Monoclonal gammopathy 09/01/2012   Increased total protein, increased beta globulin peak with decreased gammaglobulin peak on SPEP  08/18/12  IFE not done "possible faint abnormal band"  . Multiple myeloma, without mention of having achieved remission 10/14/2012   Stage I [place]; good prognosis international scoring system: see 10/13/12 progress note --> ? in Remission as of 06/19/2013; ? Failed Auto BMT; Continues to be on Chemo  with planned Donor BMT  . Normochromic normocytic anemia 09/01/2012   Hb 12.7, MCV 95  08/18/12 was 13.7 1 year ago; concomitant leukopenia  & increased lymphs  . Pneumococcal vaccine refused 08/15/2012    Past Surgical History:  Procedure Laterality Date  . COLONOSCOPY     ?  . CORONARY ARTERY BYPASS GRAFT  10/2003   LIMA to LAD, RIMA to OM1, sequential to OM2; Dr. Elisabeth Cara    There were no vitals filed for this visit.   Subjective Assessment - 05/05/18 1027    Subjective  Has been inactive for several years now. 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 Myeloma is in remission. GVHD is active. I hurt my foot and it hurts under the bottom and my ankle is swollen. My neuropathy is so bad i wouldnt know if I sprained it.     Pertinent History  multiple myeloma 09/2012, underwent transplant from self that failed then underwent transplant from donor, completed chemotherapy 4 or 5 years ago, GVHD 1st bout affected eyes and skin, 2nd bout  started in mouth and muscles started 3 months ago, hx of blood clot RLE, CAD s/p CABG,     Patient Stated Goals  to get where I can get up off the ground when I bend down too far    Currently in Pain?  Yes    Pain Score  7     Pain Location  Leg   hands, arms   Pain Orientation  Right;Left    Pain Descriptors / Indicators  Numbness;Tingling;Throbbing    Pain Type  Neuropathic pain    Pain Onset  More than a month ago    Pain Frequency  Constant    Aggravating Factors   cold weather    Pain Relieving Factors  heat    Effect of Pain on Daily Activities  hard to complete daily activities          [facility] PT Assessment - 05/05/18 0001      Assessment   Medical Diagnosis  multiple myeloma, GVHD    Referring Provider (PT)  Valli Glance    Onset Date/Surgical Date  09/25/12    Hand Dominance  Left    Prior Therapy  none      Precautions   Precautions  Other (comment)    Precaution Comments  neuropathy, CAD   not able to lift over 40 lbs     Restrictions   Weight Bearing Restrictions  Yes    Other Position/Activity Restrictions  no lifting over 40 lbs      Balance Screen   Has the patient fallen in the past 6 months  Yes    How many times?  6    Has the patient had a decrease in activity level because of a fear of falling?   No    Is the patient reluctant to leave their home because of a fear of falling?   No      Home Film/video editor residence    Living Arrangements  Spouse/significant other;Children    Available Help at Discharge  Family    Type of [place] to enter    Entrance Stairs-Number of Steps  1    Entrance Stairs-Rails  None    Home Layout  Two level      Prior Function   Level of Independence  Independent    Vocation  Full time employment    [employer], does estimating, paperwork, billing, mostly computer work    Leisure  not exercising right now      Charity fundraiser Status  Within Functional Limits for tasks assessed      Observation/Other Assessments   Observations  left ankle swollen and slightly bruised, pt reports this happened at work and he has not been to the doctor yet      ROM / Strength   AROM / PROM / Strength  Strength      Strength   Overall Strength  Deficits    Strength Assessment Site  Shoulder;Elbow;Hip;Knee;Ankle    Right Shoulder Flexion  3+/5    Right Shoulder ABduction  4/5    Left Shoulder Flexion  3/5    Left Shoulder ABduction  4/5    Right Elbow Flexion  5/5    Right Elbow Extension  3+/5    Left Elbow Flexion   5/5    Left Elbow Extension  3/5    Right Hip Flexion  3/5    Left Hip Flexion  3/5    Right Knee Flexion  3/5    Right Knee Extension  4/5    Left Knee Flexion  3/5    Left Knee Extension  4/5    Right Ankle Dorsiflexion  4+/5    Left Ankle Dorsiflexion  4+/5      Ambulation/Gait   Ambulation/Gait  Yes    Ambulation/Gait Assistance  6: Modified independent (Device/Increase time)    Ambulation Distance (Feet)  25 Feet    Gait Pattern  Decreased trunk rotation;Poor foot clearance - left;Poor foot clearance - right      Standardized Balance Assessment   Standardized Balance Assessment  Timed Up and Go Test;Five Times Sit to Stand    Five times sit to stand comments   pt unable to stand from chair without using arms      Timed Up and Go Test   TUG  Normal TUG    Normal TUG (seconds)  16                Objective measurements completed on examination: See above findings.              PT Education - 05/05/18 1234    Education Details  importance of walking program    Person(s) Educated  Patient;Spouse    Methods  Explanation    Comprehension  Verbalized understanding          PT Long Term Goals - 05/05/18 1246      PT LONG TERM GOAL #1   Title  Pt will demonstrate 4/5 shoulder flexion strength to allow pt to return to prior level of function    Baseline  R 3+/5, L 3/5    Time  4    Period  Weeks    Status  New    Target Date  06/02/18      PT LONG TERM GOAL #2   Title  Pt will demonstrate 4/5 bilateral hip flexion to decrease fall risks    Baseline  R 3+/5, L 3/5    Time  4    Period  Weeks    Status  New    Target Date  06/02/18      PT LONG TERM GOAL #3   Title  Pt will report 0 falls for 3 weeks in a row     Time  4    Period  Weeks    Status  New    Target Date  06/02/18      PT LONG TERM GOAL #4   Title  Pt will be able to sit to stand from chair at least 7 times in 30 seconds without use of UEs to decrease fall risk    Baseline  0      Time  4    Period  Weeks    Status  New    Target Date  06/02/17      PT LONG TERM GOAL #5   Title  Pt will be independent in a home exercise program for continued strengthening and stretching    Time  4    Period  Weeks    Status  New    Target Date  06/02/17             Plan - 05/05/18 1235    Clinical Impression Statement  Pt presents to PT with muscle weakness and difficulty walking following treatments for multiple myeloma which began in  2014. He received a transplant using his own cells but this failed then had to use a donor. He is currently in remission but developed graft vs host disease which has been affecting his muscles. He also has chronic dry eye due to the disease. He has weakness throughout bilateral UE and LEs. He took 16 sec to complete the TUG which places him at an increased fall risk and he is unable to stand from a chair without using his UEs. Pt's wife reports he has been sedentary for several years since starting treatments. He is starting a new drug for graft vs host disease. He would benefit from skilled PT services to increase bilateral UE and LE strength, improve balance, decrease difficulty with walking and decrease fatigue.    History and Personal Factors relevant to plan of care:  graft vs host disease, has undergone numerous treatments, severe peripheral neuropathy    Clinical Presentation  Evolving    Clinical Presentation due to:  starting new drug for graft vs host disease    Clinical Decision Making  Moderate    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    PT Frequency  2x / week    PT Duration  4 weeks    PT Treatment/Interventions  ADLs/Self Care Home Management;Therapeutic exercise;Therapeutic activities;Patient/family education;Manual techniques;Passive range of motion;Gait training;Stair training    PT Next Visit Plan  measure shoulder and ankle ROM and add goals as needed, begin supine  and seated UE and LE exercises, bike, balance exercises    PT Home Exercise Plan  walking program    Consulted and Agree with Plan of Care  Patient       Patient will benefit from skilled therapeutic intervention in order to improve the following deficits and impairments:  Pain, Impaired sensation, Decreased range of motion, Decreased strength, Decreased activity tolerance, Decreased endurance, Decreased balance, Difficulty walking  Visit Diagnosis: Muscle weakness (generalized) - Plan: PT plan of care cert/re-cert  Difficulty in walking, not elsewhere classified - Plan: PT plan of care cert/re-cert  Repeated falls - Plan: PT plan of care cert/re-cert     Problem List Patient Active Problem List   Diagnosis Date Noted  . 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 Dyslipidemia, goal LDL below 70 04/03/2014  . Aortic valve insufficiency, senile calcific   . Multiple myeloma in remission ([place]) 06/19/2013  . Moderate mitral regurgitation by prior echocardiogram 02/25/2013  . Mitral valve anterior leaflet prolapse 02/25/2013  . Hx of valvular heart disease - MVP with midl-mod MR, Mild AI with Aortic Sclerosis 02/16/2013  . CAD in native artery -- s/p CABG 02/16/2013  . Monoclonal gammopathy 09/01/2012  . Normochromic normocytic anemia 09/01/2012  . Leukopenia 09/01/2012  . Hx of CABG 08/02/2011  . Hyperlipidemia LDL goal <70 08/02/2011  . Allergic rhinitis, mild 08/02/2011  . Arthralgia of hand 10/11/2010  . Back pain 10/11/2010  . Fatigue 10/11/2010  . ED (erectile dysfunction) 10/11/2010    Allyson Sabal [facility] 05/05/2018, 4:26 PM  [place], [place], [address] Phone: [phone]   Fax:  [phone]  Name: [name] MRN: [id number] Date of Birth: [birth year]  Manus Gunning, PT 05/05/18 4:26 PM

## 2018-05-08 ENCOUNTER — Ambulatory Visit: Payer: BLUE CROSS/BLUE SHIELD | Admitting: Physical Therapy

## 2018-05-13 ENCOUNTER — Ambulatory Visit: Payer: BLUE CROSS/BLUE SHIELD | Admitting: Physical Therapy

## 2018-05-13 ENCOUNTER — Encounter: Payer: Self-pay | Admitting: Physical Therapy

## 2018-05-13 ENCOUNTER — Other Ambulatory Visit: Payer: Self-pay

## 2018-05-13 DIAGNOSIS — M6281 Muscle weakness (generalized): Secondary | ICD-10-CM

## 2018-05-13 DIAGNOSIS — R296 Repeated falls: Secondary | ICD-10-CM | POA: Diagnosis not present

## 2018-05-13 DIAGNOSIS — R262 Difficulty in walking, not elsewhere classified: Secondary | ICD-10-CM | POA: Diagnosis not present

## 2018-05-13 NOTE — Therapy (Signed)
[place], [place], [address] Phone: [phone]   Fax:  [phone]  Physical Therapy Treatment  Patient Details  Name: [name] MRN: [id number] Date of Birth: [birth year] Referring Provider (PT): Valli Glance   Encounter Date: 05/13/2018  PT End of Session - 05/13/18 1602    Visit Number  2    Number of Visits  9    Date for PT Re-Evaluation  06/02/18    PT Start Time  1525    PT Stop Time  1600    PT Time Calculation (min)  35 min    Activity Tolerance  Patient tolerated treatment well    Behavior During Therapy  [facility] for tasks assessed/performed       Past Medical History:  Diagnosis Date  . Anxiety   . Aortic valve insufficiency, senile calcific October 14   Moderate regurgitation, eccentric towards Ant MV Leaflet  . Bence Jones proteinuria 09/01/2012   Borderline increase protein 190 mg on 24 hr urine IFE with free kappa light chains (lab normal 50-100 mg)  08/18/12  . CAD in native artery    s/p CABG; -- Cards: Dr. Roni Bread, [facility]; [facility] 01/2013 EF 57%, No ischemia or Infarction.  . Erectile dysfunction   . Essential hypertension   . H/O cardiovascular stress test 02/22/10   normal perfusion, no ischemia or infarct; treadmill and Myoview perfusion study; Dr. Elisabeth Cara  . Hyperlipidemia LDL goal <70   . Leukopenia 09/01/2012   WBC 3,700 42 poly, 48 lymphs, 9 monos 08/18/12  Was 3,700 1 year ago  . 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 Moderate mitral regurgitation by prior echocardiogram October 2014   Echo 02/2013: Severe holosystolic Ant MV Leaflet prolapse, Moderate regurgitation.  Mildly dilated LV with normal systolic function (EF 25-36%), moderate LA dilation, upper normal PA pressures ~ 34 mmHg.  . Monoclonal gammopathy 09/01/2012   Increased total protein, increased beta globulin peak with decreased gammaglobulin peak on SPEP  08/18/12  IFE not done "possible faint abnormal band"  . Multiple myeloma, without mention of having achieved remission 10/14/2012   Stage I Durie-Salmon; good prognosis international scoring system: see 10/13/12 progress note --> ? in Remission as of 06/19/2013; ? Failed Auto BMT; Continues to be on Chemo  with planned Donor BMT  . Normochromic normocytic anemia 09/01/2012   Hb 12.7, MCV 95  08/18/12 was 13.7 1 year ago; concomitant leukopenia  & increased lymphs  . Pneumococcal vaccine refused 08/15/2012    Past Surgical History:  Procedure Laterality Date  . COLONOSCOPY     ?  . CORONARY ARTERY BYPASS GRAFT  10/2003   LIMA to LAD, RIMA to OM1, sequential to OM2; Dr. Elisabeth Cara    There were no vitals filed for this visit.  Subjective Assessment - 05/13/18 1526    Subjective  My foot is still swelling but it is getting better.     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  Yes  Pain Score  7     Pain Location  Leg   feet and hands    Pain Orientation  Right;Left    Pain Descriptors / Indicators  Tingling;Throbbing;Numbness    Pain Type  Neuropathic pain         [facility] PT Assessment - 05/13/18 0001      ROM / Strength   AROM / PROM / Strength  AROM      AROM   AROM Assessment Site  Shoulder    Right/Left Shoulder  Right;Left    Right Shoulder Flexion  125 Degrees    Right Shoulder ABduction  114 Degrees    Left Shoulder Flexion  140 Degrees    Left Shoulder ABduction  130 Degrees                   [facility] Adult PT Treatment/Exercise - 05/13/18 0001      Exercises   Exercises  Lumbar;Knee/Hip;Shoulder       Lumbar Exercises: Supine   Pelvic Tilt  10 reps    Pelvic Tilt Limitations  pt requires frequent verbal and tactile cues not to push outwards    Clam  10 reps;Other (comment)   with pelvic tilt with max verbal cues on how to maintaintilt   Bent Knee Raise  10 reps   2 sets of 10 - pt did not have difficulty    Bridge  10 reps;3 seconds      Knee/Hip Exercises: Aerobic   Stationary Bike  Level 1 x 2 min and 20 sec RPE 7-8/10      Knee/Hip Exercises: Standing   Other Standing Knee Exercises  flexion, abduction and extension all x 10 reps holding on to back of bike for support - pt returned therapist demo      Shoulder Exercises: Supine   Flexion  AAROM;Both;10 reps   hold for 10 to 15 sec, pt returned therapist demo   ABduction  AAROM;5 reps;Left   pt returned therapist demo                 PT Long Term Goals - 05/13/18 1716      PT LONG TERM GOAL #1   Title  Pt will demonstrate 4/5 shoulder flexion strength to allow pt to return to prior level of function    Baseline  R 3+/5, L 3/5    Time  4    Period  Weeks    Status  New      PT LONG TERM GOAL #2   Title  Pt will demonstrate 4/5 bilateral hip flexion to decrease fall risks    Baseline  R 3+/5, L 3/5    Time  4    Period  Weeks    Status  New      PT LONG TERM GOAL #3   Title  Pt will report 0 falls for 3 weeks in a row     Time  4    Period  Weeks    Status  New      PT LONG TERM GOAL #4   Title  Pt will be able to sit to stand from chair at least 7 times in 30 seconds without use of UEs to decrease fall risk    Baseline  0     Time  4    Period  Weeks    Status  New      PT LONG TERM GOAL #5   Title  Pt  will be independent in a home exercise program for continued strengthening and stretching    Time  4    Period  Weeks    Status  New      Additional Long Term Goals   Additional Long Term Goals  Yes      PT LONG TERM GOAL #6   Title  Pt will demonstrate 160 degrees of bilateral shoulder  flexion to allow him to reach overhead    Baseline  R 125, L 140    Time  4    Period  Weeks    Status  New    Target Date  06/02/17            Plan - 05/13/18 1717    Clinical Impression Statement  Began instructing pt today in supine and seated strengthening exercises. DId not use any resistance today and will see how fatigued pt was at next session. Will add resistance next session if pt did not have any soreness or fatigue. Pt quickly fatigued on stationary bike and was only able to complete 2 min and 20 sec on level 1. Issued a home exercise program for pt for shoulder ROM since he is very limited and added a goal for this. Encouraged pt to continue to walk on the treadmill at home.     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    PT Frequency  2x / week    PT Duration  4 weeks    PT Treatment/Interventions  ADLs/Self Care Home Management;Therapeutic exercise;Therapeutic activities;Patient/family education;Manual techniques;Passive range of motion;Gait training;Stair training    PT Next Visit Plan  continue supine and seated UE and LE exercises, bike, balance exercises    PT Home Exercise Plan  walking program, shoulder dowel exercises    Consulted and Agree with Plan of Care  Patient       Patient will benefit from skilled therapeutic intervention in order to improve the following deficits and impairments:  Pain, Impaired sensation, Decreased range of motion, Decreased strength, Decreased activity tolerance, Decreased endurance, Decreased balance, Difficulty walking  Visit Diagnosis: Muscle weakness (generalized)  Difficulty in walking, not elsewhere classified  Repeated falls     Problem List Patient Active Problem List   Diagnosis Date Noted  . 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 Dyslipidemia, goal LDL below 70 04/03/2014  . Aortic valve insufficiency, senile calcific   . Multiple myeloma in  remission ([place]) 06/19/2013  . Moderate mitral regurgitation by prior echocardiogram 02/25/2013  . Mitral valve anterior leaflet prolapse 02/25/2013  . Hx of valvular heart disease - MVP with midl-mod MR, Mild AI with Aortic Sclerosis 02/16/2013  . CAD in native artery -- s/p CABG 02/16/2013  . Monoclonal gammopathy 09/01/2012  . Normochromic normocytic anemia 09/01/2012  . Leukopenia 09/01/2012  . Hx of CABG 08/02/2011  . Hyperlipidemia LDL goal <70 08/02/2011  . Allergic rhinitis, mild 08/02/2011  . Arthralgia of hand 10/11/2010  . Back pain 10/11/2010  . Fatigue 10/11/2010  . ED (erectile dysfunction) 10/11/2010    Allyson Sabal [facility] 05/13/2018, 5:20 PM  [place], [place], [address] Phone: [phone]   Fax:  [phone]  Name: [name] MRN: [id number] Date of Birth: [birth year]  Manus Gunning, PT 05/13/18 5:20 PM

## 2018-05-13 NOTE — Patient Instructions (Signed)
Shoulder: Flexion (Supine)    With hands shoulder width apart, slowly lower dowel to floor behind head. Do not let elbows bend. Keep back flat. Hold _15-30___ seconds. Repeat _10___ times. Do __2__ sessions per day. CAUTION: Stretch slowly and gently.  Copyright  VHI. All rights reserved.  Shoulder: Abduction (Supine)    With right arm flat on floor, hold dowel in palm. Slowly move arm up to side of head by pushing with opposite arm. Do not let elbow bend. Hold 15-30____ seconds. Repeat __10__ times. Do _2___ sessions per day. Repeat on left side.  CAUTION: Stretch slowly and gently.  Copyright  VHI. All rights reserved.

## 2018-05-15 ENCOUNTER — Other Ambulatory Visit: Payer: Self-pay

## 2018-05-15 ENCOUNTER — Encounter: Payer: Self-pay | Admitting: Physical Therapy

## 2018-05-15 ENCOUNTER — Ambulatory Visit: Payer: BLUE CROSS/BLUE SHIELD | Admitting: Physical Therapy

## 2018-05-15 DIAGNOSIS — R262 Difficulty in walking, not elsewhere classified: Secondary | ICD-10-CM | POA: Diagnosis not present

## 2018-05-15 DIAGNOSIS — R296 Repeated falls: Secondary | ICD-10-CM

## 2018-05-15 DIAGNOSIS — M6281 Muscle weakness (generalized): Secondary | ICD-10-CM

## 2018-05-15 NOTE — Patient Instructions (Signed)
Access Code: [email or web address]  URL: [email or web address]  Date: 05/15/2018  Prepared by: Allyson Sabal [facility]   Exercises  Supine March - 10 reps - 1 sets - 1x daily - 7x weekly  Supine Transversus Abdominis Bracing with Double Leg Fallout - 10 reps - 1 sets - 1x daily - 7x weekly  Supine Transversus Abdominis Bracing with Pelvic Floor Contraction - 10 reps - 1 sets - 1x daily - 7x weekly  Supine Bridge - 10 reps - 1 sets - 3 sec hold - 1x daily - 7x weekly  Supine Active Straight Leg Raise - 10 reps - 1 sets - 1x daily - 7x weekly

## 2018-05-15 NOTE — Therapy (Signed)
[place], [place], [address] Phone: [phone]   Fax:  [phone]  Physical Therapy Treatment  Patient Details  Name: [name] MRN: [id number] Date of Birth: 1960/10/28 Referring Provider (PT): Valli Glance   Encounter Date: 05/15/2018  PT End of Session - 05/15/18 1100    Visit Number  3    Number of Visits  9    Date for PT Re-Evaluation  06/02/18    PT Start Time  4097    PT Stop Time  1057    PT Time Calculation (min)  42 min    Activity Tolerance  Patient tolerated treatment well    Behavior During Therapy  [facility] for tasks assessed/performed       Past Medical History:  Diagnosis Date  . Anxiety   . Aortic valve insufficiency, senile calcific October 14   Moderate regurgitation, eccentric towards Ant MV Leaflet  . Bence Jones proteinuria 09/01/2012   Borderline increase protein 190 mg on 24 hr urine IFE with free kappa light chains (lab normal 50-100 mg)  08/18/12  . CAD in native artery    s/p CABG; -- Cards: Dr. Roni Bread, [facility]; [facility] 01/2013 EF 57%, No ischemia or Infarction.  . Erectile dysfunction   . Essential hypertension   . H/O cardiovascular stress test 02/22/10   normal perfusion, no ischemia or infarct; treadmill and Myoview perfusion study; Dr. Elisabeth Cara  . Hyperlipidemia LDL goal <70   . Leukopenia 09/01/2012   WBC 3,700 42 poly, 48 lymphs, 9 monos 08/18/12  Was 3,700 1 year ago  . 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 Moderate mitral regurgitation by prior echocardiogram October 2014   Echo 02/2013: Severe holosystolic Ant MV Leaflet prolapse, Moderate regurgitation.  Mildly dilated LV with normal systolic function (EF 35-32%), moderate LA dilation, upper normal PA pressures ~ 34 mmHg.  . Monoclonal gammopathy 09/01/2012   Increased total protein, increased beta globulin peak with decreased gammaglobulin peak on SPEP  08/18/12  IFE not done "possible faint abnormal band"  . Multiple myeloma, without mention of having achieved remission 10/14/2012   Stage I Durie-Salmon; good prognosis international scoring system: see 10/13/12 progress note --> ? in Remission as of 06/19/2013; ? Failed Auto BMT; Continues to be on Chemo  with planned Donor BMT  . Normochromic normocytic anemia 09/01/2012   Hb 12.7, MCV 95  08/18/12 was 13.7 1 year ago; concomitant leukopenia  & increased lymphs  . Pneumococcal vaccine refused 08/15/2012    Past Surgical History:  Procedure Laterality Date  . COLONOSCOPY     ?  . CORONARY ARTERY BYPASS GRAFT  10/2003   LIMA to LAD, RIMA to OM1, sequential to OM2; Dr. Elisabeth Cara    There were no vitals filed for this visit.  Subjective Assessment - 05/15/18 1016    Subjective  The stretches are going well at home. I am feeling better since I started.     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  Yes    Pain Score  7     Pain Location  Leg    Pain Orientation  Right;Left         [facility] PT Assessment - 05/15/18 0001      AROM   Right Shoulder Flexion  130 Degrees    Right Shoulder ABduction  116 Degrees    Left Shoulder Flexion  135 Degrees    Left Shoulder ABduction  125 Degrees                   [facility] Adult PT Treatment/Exercise - 05/15/18 0001      Lumbar Exercises: Supine   Pelvic Tilt  10 reps    Pelvic Tilt Limitations  pt requires frequent verbal and tactile cues not to push outwards    Clam  10 reps;Other (comment)   with pelvic tilt with min verbal cues to maintain pelvictilt   Bent Knee Raise  10  reps   with 1 lb weights-2 sets of 10 with pelvic tilt   Bent Knee Raise Limitations  pt maintained pelvic tilt with bent knee raise 90% of time but had to reset pelvic tilt after each rep to maintain    Bridge  10 reps;3 seconds   cues to keep core engaged throughout   Straight Leg Raise  10 reps   with 1 lb weights while keeping core engaged     Knee/Hip Exercises: Aerobic   Stationary Bike  Level 1 x 2 min and 52 sec       Knee/Hip Exercises: Supine   Short Arc Quad Sets  Strengthening;Both;1 set   10 reps each with 1 lb ankle weights and 5 sec holds                 PT Long Term Goals - 05/13/18 1716      PT LONG TERM GOAL #1   Title  Pt will demonstrate 4/5 shoulder flexion strength to allow pt to return to prior level of function    Baseline  R 3+/5, L 3/5    Time  4    Period  Weeks    Status  New      PT LONG TERM GOAL #2   Title  Pt will demonstrate 4/5 bilateral hip flexion to decrease fall risks    Baseline  R 3+/5, L 3/5    Time  4    Period  Weeks    Status  New      PT LONG TERM GOAL #3   Title  Pt will report 0 falls for 3 weeks in a row     Time  4    Period  Weeks    Status  New      PT LONG TERM GOAL #4   Title  Pt will be able to sit to stand from chair at least 7 times in 30 seconds without use of UEs to decrease fall risk    Baseline  0     Time  4    Period  Weeks    Status  New      PT LONG TERM GOAL #5   Title  Pt will be independent in a home exercise program for continued strengthening and stretching    Time  4    Period  Weeks    Status  New      Additional Long Term Goals   Additional Long Term Goals  Yes  PT LONG TERM GOAL #6   Title  Pt will demonstrate 160 degrees of bilateral shoulder flexion to allow him to reach overhead    Baseline  R 125, L 140    Time  4    Period  Weeks    Status  New    Target Date  06/02/17            Plan - 05/15/18 1045    Clinical Impression Statement  Pt was able to  coordinate his pelvic tilt today and engage core muscles with LE exercises today. He did not require as much cueing to active core muscles instead of pushing his core outwards. Added resistance to LE supine exercises today and pt was able to complete an extra 30 seconds on the bike. Issued pt a home exercise program with LE strengthening exercises today.     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    PT Frequency  2x / week    PT Duration  4 weeks    PT Treatment/Interventions  ADLs/Self Care Home Management;Therapeutic exercise;Therapeutic activities;Patient/family education;Manual techniques;Passive range of motion;Gait training;Stair training    PT Next Visit Plan  continue supine and seated UE and LE exercises, bike, balance exercises    PT Home Exercise Plan  walking program, shoulder dowel exercises, Access Code: [email or web address]    Consulted and Agree with Plan of Care  Patient       Patient will benefit from skilled therapeutic intervention in order to improve the following deficits and impairments:  Pain, Impaired sensation, Decreased range of motion, Decreased strength, Decreased activity tolerance, Decreased endurance, Decreased balance, Difficulty walking  Visit Diagnosis: Muscle weakness (generalized)  Difficulty in walking, not elsewhere classified  Repeated falls     Problem List Patient Active Problem List   Diagnosis Date Noted  . 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 Dyslipidemia, goal LDL below 70 04/03/2014  . Aortic valve insufficiency, senile calcific   . Multiple myeloma in remission ([place]) 06/19/2013  . Moderate mitral regurgitation by prior echocardiogram 02/25/2013  . Mitral valve anterior leaflet prolapse 02/25/2013  . Hx of valvular heart disease - MVP with midl-mod MR, Mild AI with Aortic Sclerosis 02/16/2013  . CAD in native artery -- s/p CABG 02/16/2013  . Monoclonal gammopathy 09/01/2012   . Normochromic normocytic anemia 09/01/2012  . Leukopenia 09/01/2012  . Hx of CABG 08/02/2011  . Hyperlipidemia LDL goal <70 08/02/2011  . Allergic rhinitis, mild 08/02/2011  . Arthralgia of hand 10/11/2010  . Back pain 10/11/2010  . Fatigue 10/11/2010  . ED (erectile dysfunction) 10/11/2010    Allyson Sabal [facility] 05/15/2018, 11:01 AM  [place], [place], [address] Phone: [phone]   Fax:  [phone]  Name: [name] MRN: [id number] Date of Birth: 24-Jan-1961  Manus Gunning, PT 05/15/18 11:01 AM

## 2018-05-19 ENCOUNTER — Encounter: Payer: Self-pay | Admitting: Physical Therapy

## 2018-05-19 ENCOUNTER — Other Ambulatory Visit: Payer: Self-pay

## 2018-05-19 ENCOUNTER — Ambulatory Visit: Payer: BLUE CROSS/BLUE SHIELD | Admitting: Physical Therapy

## 2018-05-19 DIAGNOSIS — Z006 Encounter for examination for normal comparison and control in clinical research program: Secondary | ICD-10-CM | POA: Diagnosis not present

## 2018-05-19 DIAGNOSIS — G629 Polyneuropathy, unspecified: Secondary | ICD-10-CM | POA: Diagnosis not present

## 2018-05-19 DIAGNOSIS — Z79899 Other long term (current) drug therapy: Secondary | ICD-10-CM | POA: Diagnosis not present

## 2018-05-19 DIAGNOSIS — C9000 Multiple myeloma not having achieved remission: Secondary | ICD-10-CM | POA: Diagnosis not present

## 2018-05-19 DIAGNOSIS — H04123 Dry eye syndrome of bilateral lacrimal glands: Secondary | ICD-10-CM | POA: Diagnosis not present

## 2018-05-19 DIAGNOSIS — K1370 Unspecified lesions of oral mucosa: Secondary | ICD-10-CM | POA: Diagnosis not present

## 2018-05-19 DIAGNOSIS — M109 Gout, unspecified: Secondary | ICD-10-CM | POA: Diagnosis not present

## 2018-05-19 DIAGNOSIS — R262 Difficulty in walking, not elsewhere classified: Secondary | ICD-10-CM

## 2018-05-19 DIAGNOSIS — D509 Iron deficiency anemia, unspecified: Secondary | ICD-10-CM | POA: Diagnosis not present

## 2018-05-19 DIAGNOSIS — R296 Repeated falls: Secondary | ICD-10-CM | POA: Diagnosis not present

## 2018-05-19 DIAGNOSIS — M6281 Muscle weakness (generalized): Secondary | ICD-10-CM

## 2018-05-19 DIAGNOSIS — Z951 Presence of aortocoronary bypass graft: Secondary | ICD-10-CM | POA: Diagnosis not present

## 2018-05-19 DIAGNOSIS — C9001 Multiple myeloma in remission: Secondary | ICD-10-CM | POA: Diagnosis not present

## 2018-05-19 DIAGNOSIS — Z7952 Long term (current) use of systemic steroids: Secondary | ICD-10-CM | POA: Diagnosis not present

## 2018-05-19 DIAGNOSIS — T380X5A Adverse effect of glucocorticoids and synthetic analogues, initial encounter: Secondary | ICD-10-CM | POA: Diagnosis not present

## 2018-05-19 DIAGNOSIS — D89811 Chronic graft-versus-host disease: Secondary | ICD-10-CM | POA: Diagnosis not present

## 2018-05-19 DIAGNOSIS — Z5181 Encounter for therapeutic drug level monitoring: Secondary | ICD-10-CM | POA: Diagnosis not present

## 2018-05-19 DIAGNOSIS — I2510 Atherosclerotic heart disease of native coronary artery without angina pectoris: Secondary | ICD-10-CM | POA: Diagnosis not present

## 2018-05-19 DIAGNOSIS — R739 Hyperglycemia, unspecified: Secondary | ICD-10-CM | POA: Diagnosis not present

## 2018-05-19 DIAGNOSIS — T865 Complications of stem cell transplant: Secondary | ICD-10-CM | POA: Diagnosis not present

## 2018-05-19 NOTE — Therapy (Signed)
[place], [place], [address] Phone: [phone]   Fax:  [phone]  Physical Therapy Treatment  Patient Details  Name: [name] MRN: [id number] Date of Birth: May 31, 1960 Referring Provider (PT): Valli Glance   Encounter Date: 05/19/2018  PT End of Session - 05/19/18 1618    Visit Number  4    Number of Visits  9    Date for PT Re-Evaluation  06/02/18    PT Start Time  1444   pt arrived late   PT Stop Time  1518    PT Time Calculation (min)  34 min    Activity Tolerance  Patient tolerated treatment well    Behavior During Therapy  [facility] for tasks assessed/performed       Past Medical History:  Diagnosis Date  . Anxiety   . Aortic valve insufficiency, senile calcific October 14   Moderate regurgitation, eccentric towards Ant MV Leaflet  . Bence Jones proteinuria 09/01/2012   Borderline increase protein 190 mg on 24 hr urine IFE with free kappa light chains (lab normal 50-100 mg)  08/18/12  . CAD in native artery    s/p CABG; -- Cards: Dr. Roni Bread, [facility]; [facility] 01/2013 EF 57%, No ischemia or Infarction.  . Erectile dysfunction   . Essential hypertension   . H/O cardiovascular stress test 02/22/10   normal perfusion, no ischemia or infarct; treadmill and Myoview perfusion study; Dr. Elisabeth Cara  . Hyperlipidemia LDL goal <70   . Leukopenia 09/01/2012   WBC 3,700 42 poly, 48 lymphs, 9 monos 08/18/12  Was 3,700 1 year ago  . 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 Moderate mitral regurgitation by prior echocardiogram October 2014   Echo 02/2013: Severe holosystolic Ant MV Leaflet prolapse, Moderate regurgitation.  Mildly dilated LV with normal systolic function (EF 99-83%), moderate LA dilation, upper normal PA pressures ~ 34 mmHg.  . Monoclonal gammopathy 09/01/2012   Increased total protein, increased beta globulin peak with decreased  gammaglobulin peak on SPEP 08/18/12  IFE not done "possible faint abnormal band"  . Multiple myeloma, without mention of having achieved remission 10/14/2012   Stage I Durie-Salmon; good prognosis international scoring system: see 10/13/12 progress note --> ? in Remission as of 06/19/2013; ? Failed Auto BMT; Continues to be on Chemo  with planned Donor BMT  . Normochromic normocytic anemia 09/01/2012   Hb 12.7, MCV 95  08/18/12 was 13.7 1 year ago; concomitant leukopenia  & increased lymphs  . Pneumococcal vaccine refused 08/15/2012    Past Surgical History:  Procedure Laterality Date  . COLONOSCOPY     ?  . CORONARY ARTERY BYPASS GRAFT  10/2003   LIMA to LAD, RIMA to OM1, sequential to OM2; Dr. Elisabeth Cara    There were no vitals filed for this visit.  Subjective Assessment - 05/19/18 1444    Subjective  I was tired after last time. I was a little sore after last time but not bad.     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  Yes    Pain Score  7     Pain Location  Leg    Pain Orientation  Right;Left                       [facility] Adult PT Treatment/Exercise - 05/19/18 0001      Lumbar Exercises: Supine   Pelvic Tilt  10 reps    Pelvic Tilt Limitations  pt did not require any cueing to perform correctly today    Clam  10 reps;Other (comment)   with pelvic tilt, pt only lost pelvic tilt twice   Bent Knee Raise  10 reps   with 1 lb weights-2 sets of 10 with pelvic tilt   Bent Knee Raise Limitations  pt able to maintain pelvic tilt through all reps    Bridge  10 reps;3 seconds   cues to keep core engaged  throughout   Straight Leg Raise  10 reps   with 2 lb weights while keeping core engaged     Knee/Hip Exercises: Aerobic   Stationary Bike  Level 1 x 3 min 7 sec   RPE 7-8/10     Knee/Hip Exercises: Seated   Long Arc Quad  Strengthening;Both;2 sets   10 reps, with v/c for controlled descent   [employer] Weight  1 lbs.    Marching  Strengthening;Both;1 set   10 reps, with cues for controlled descent   Marching Weights  1 lbs.    Abduction/Adduction   Strengthening;Both;1 set   20 reps with blue theraband with v/c for slow eccentric                  PT Long Term Goals - 05/13/18 1716      PT LONG TERM GOAL #1   Title  Pt will demonstrate 4/5 shoulder flexion strength to allow pt to return to prior level of function    Baseline  R 3+/5, L 3/5    Time  4    Period  Weeks    Status  New      PT LONG TERM GOAL #2   Title  Pt will demonstrate 4/5 bilateral hip flexion to decrease fall risks    Baseline  R 3+/5, L 3/5    Time  4    Period  Weeks    Status  New      PT LONG TERM GOAL #3   Title  Pt will report 0 falls for 3 weeks in a row     Time  4    Period  Weeks    Status  New      PT LONG TERM GOAL #4   Title  Pt will be able to sit to stand from chair at least 7 times in 30 seconds without use of UEs to decrease fall risk    Baseline  0     Time  4    Period  Weeks    Status  New      PT LONG TERM GOAL #5   Title  Pt will be independent in a home exercise program for continued strengthening and stretching    Time  4    Period  Weeks    Status  New      Additional Long Term Goals   Additional Long Term Goals  Yes      PT LONG TERM GOAL #6   Title  Pt will demonstrate 160 degrees of bilateral shoulder flexion to allow him to reach overhead    Baseline  R 125, L 140    Time  4    Period  Weeks    Status  New    Target Date  06/02/17            Plan - 05/19/18 1618    Clinical Impression Statement  Added seated strengthening exercises  today and pt did well with this. They were fatiguing. He is now able to coordinate core contractions with LE exercises with much less cueing. He is able to successfully perform pelvic tilts correctly.     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    PT Frequency  2x / week    PT Duration  4 weeks    PT Treatment/Interventions  ADLs/Self Care Home Management;Therapeutic exercise;Therapeutic activities;Patient/family education;Manual techniques;Passive range of motion;Gait training;Stair training    PT Next Visit Plan  continue supine and seated UE and LE exercises, bike, balance exercises    PT Home Exercise Plan  walking program, shoulder dowel exercises, Access Code: [email or web address]    Consulted and Agree with Plan of Care  Patient       Patient will benefit from skilled therapeutic intervention in order to improve the following deficits and impairments:  Pain, Impaired sensation, Decreased range of motion, Decreased strength, Decreased activity tolerance, Decreased endurance, Decreased balance, Difficulty walking  Visit Diagnosis: Muscle weakness (generalized)  Difficulty in walking, not elsewhere classified  Repeated falls     Problem List Patient Active Problem List   Diagnosis Date Noted  . 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 Dyslipidemia, goal LDL below 70 04/03/2014  . Aortic valve insufficiency, senile calcific   . Multiple myeloma in remission ([place]) 06/19/2013  . Moderate mitral regurgitation by prior echocardiogram 02/25/2013  . Mitral valve anterior leaflet prolapse 02/25/2013  . Hx of valvular heart disease - MVP with midl-mod MR, Mild AI with Aortic Sclerosis 02/16/2013  . CAD in native artery -- s/p CABG 02/16/2013  . Monoclonal gammopathy 09/01/2012  . Normochromic normocytic anemia 09/01/2012  . Leukopenia 09/01/2012  . Hx of CABG 08/02/2011  . Hyperlipidemia LDL goal <70 08/02/2011  . Allergic  rhinitis, mild 08/02/2011  . Arthralgia of hand 10/11/2010  . Back pain 10/11/2010  . Fatigue 10/11/2010  . ED (erectile dysfunction) 10/11/2010    Allyson Sabal [facility] 05/19/2018, 4:20 PM  [place], [place], [address] Phone: [phone]   Fax:  [phone]  Name: [name] MRN: [id number] Date of Birth: [birth year]  Manus Gunning, PT 05/19/18 4:21 PM

## 2018-05-23 ENCOUNTER — Encounter

## 2018-05-27 ENCOUNTER — Ambulatory Visit: Payer: BLUE CROSS/BLUE SHIELD

## 2018-05-27 DIAGNOSIS — R296 Repeated falls: Secondary | ICD-10-CM

## 2018-05-27 DIAGNOSIS — M6281 Muscle weakness (generalized): Secondary | ICD-10-CM

## 2018-05-27 DIAGNOSIS — R262 Difficulty in walking, not elsewhere classified: Secondary | ICD-10-CM

## 2018-05-27 NOTE — Therapy (Signed)
[place], [place], [address] Phone: [phone]   Fax:  [phone]  Physical Therapy Treatment  Patient Details  Name: [name] MRN: [id number] Date of Birth: Jul 01, 1960 Referring Provider (PT): Valli Glance   Encounter Date: 05/27/2018  PT End of Session - 05/27/18 1027    Visit Number  5    Number of Visits  9    Date for PT Re-Evaluation  06/02/18    PT Start Time  6962    PT Stop Time  1103    PT Time Calculation (min)  48 min    Activity Tolerance  Patient tolerated treatment well    Behavior During Therapy  [facility] for tasks assessed/performed       Past Medical History:  Diagnosis Date  . Anxiety   . Aortic valve insufficiency, senile calcific October 14   Moderate regurgitation, eccentric towards Ant MV Leaflet  . Bence Jones proteinuria 09/01/2012   Borderline increase protein 190 mg on 24 hr urine IFE with free kappa light chains (lab normal 50-100 mg)  08/18/12  . CAD in native artery    s/p CABG; -- Cards: Dr. Roni Bread, [facility]; [facility] 01/2013 EF 57%, No ischemia or Infarction.  . Erectile dysfunction   . Essential hypertension   . H/O cardiovascular stress test 02/22/10   normal perfusion, no ischemia or infarct; treadmill and Myoview perfusion study; Dr. Elisabeth Cara  . Hyperlipidemia LDL goal <70   . Leukopenia 09/01/2012   WBC 3,700 42 poly, 48 lymphs, 9 monos 08/18/12  Was 3,700 1 year ago  . 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 Moderate mitral regurgitation by prior echocardiogram October 2014   Echo 02/2013: Severe holosystolic Ant MV Leaflet prolapse, Moderate regurgitation.  Mildly dilated LV with normal systolic function (EF 95-28%), moderate LA dilation, upper normal PA pressures ~ 34 mmHg.  . Monoclonal gammopathy 09/01/2012   Increased total protein, increased beta globulin peak with decreased gammaglobulin peak on SPEP  08/18/12  IFE not done "possible faint abnormal band"  . Multiple myeloma, without mention of having achieved remission 10/14/2012   Stage I Durie-Salmon; good prognosis international scoring system: see 10/13/12 progress note --> ? in Remission as of 06/19/2013; ? Failed Auto BMT; Continues to be on Chemo  with planned Donor BMT  . Normochromic normocytic anemia 09/01/2012   Hb 12.7, MCV 95  08/18/12 was 13.7 1 year ago; concomitant leukopenia  & increased lymphs  . Pneumococcal vaccine refused 08/15/2012    Past Surgical History:  Procedure Laterality Date  . COLONOSCOPY     ?  . CORONARY ARTERY BYPASS GRAFT  10/2003   LIMA to LAD, RIMA to OM1, sequential to OM2; Dr. Elisabeth Cara    There were no vitals filed for this visit.  Subjective Assessment - 05/27/18 1016    Subjective  I was tired after last visit but nothing out of the ordinary. I'm doing good with exercising every day at home with the treadmilll or the bike but it wears me out fast. I do feel like my walking has improved though by feeling steadier and a little more endurance.     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  Yes    Pain Score  7     Pain Location  Foot    Pain Orientation  Right;Left    Pain Descriptors / Indicators  Numbness;Throbbing;Tingling    Pain Type  Neuropathic pain    Pain Radiating Towards  also CIPN in hands but not as bad (4-5/10)    Pain Onset  More than a month ago    Pain Frequency  Constant    Aggravating Factors   cold weather    Pain Relieving Factors  heat                       [facility] Adult PT Treatment/Exercise -  05/27/18 0001      Neuro Re-ed    Neuro Re-ed Details   In // bars briefly at end of session: Heel-toe front and then back 2x each way, then crossover in front 1x each direction with pt returing therapist demo and he required min A of bars throughout, no LOB.       Knee/Hip Exercises: Aerobic   Stationary Bike  Level 1 x 3 min 7 sec   RPE 7-8/10 after   Nustep  Level 6, x 3 mins, then level 4 x3 mins (total 6 mins) and RPE 5-6/10 after and this was towards end of session after all seated exercises.       Knee/Hip Exercises: Seated   Long Arc Quad  Strengthening;Right;Left;2 sets;10 reps;Weights   VCs to hold briefly/slower pace   Long Arc Quad Weight  2 lbs.    Marching  Strengthening;Right;Left;2 sets;10 reps;Weights    Marching Weights  2 lbs.    Abduction/Adduction   Strengthening;Left;Right;2 sets;10 reps   blue theraband   Abd/Adduction Limitations  All above seated exercises done in circuit      Shoulder Exercises: Seated   Other Seated Exercises  Bil UE 3 way raises, 2 lbs x10 each with pt returning therapist demonstration of each.                   PT Long Term Goals - 05/13/18 1716      PT LONG TERM GOAL #1   Title  Pt will demonstrate 4/5 shoulder flexion strength to allow pt to return to prior level of function    Baseline  R 3+/5, L 3/5    Time  4    Period  Weeks    Status  New      PT LONG TERM GOAL #2   Title  Pt will demonstrate 4/5 bilateral hip flexion to decrease fall risks    Baseline  R 3+/5, L 3/5    Time  4    Period  Weeks    Status  New      PT LONG TERM GOAL #3   Title  Pt will report 0 falls for 3 weeks in a row     Time  4    Period  Weeks    Status  New      PT LONG TERM GOAL #4   Title  Pt will be able to sit to stand from chair at least 7 times in 30 seconds without use of UEs to decrease fall risk    Baseline  0  Time  4    Period  Weeks    Status  New      PT LONG TERM GOAL #5   Title  Pt will be independent in a  home exercise program for continued strengthening and stretching    Time  4    Period  Weeks    Status  New      Additional Long Term Goals   Additional Long Term Goals  Yes      PT LONG TERM GOAL #6   Title  Pt will demonstrate 160 degrees of bilateral shoulder flexion to allow him to reach overhead    Baseline  R 125, L 140    Time  4    Period  Weeks    Status  New    Target Date  06/02/17            Plan - 05/27/18 1028    Clinical Impression Statement  Reviewed seated exercises issued at last visit which pt tolerated well with increased weights though did require rest between each set due to LE fatigue. He reports not having noticed improvement yet when he does aerobic exercises at home but enurance and steadiness of gait is some better. Trial of NuStep to see if pt could tolerate increased time and he could and reported liking this much better than the bike. Also began brief neuro re ed in // bras of heel-toe and crossover in front walking which he tolerated very well. Pt felt good about progress at end of session today and reported fatigued seemed less than it's been in past.     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    PT Frequency  2x / week    PT Duration  4 weeks    PT Treatment/Interventions  ADLs/Self Care Home Management;Therapeutic exercise;Therapeutic activities;Patient/family education;Manual techniques;Passive range of motion;Gait training;Stair training    PT Next Visit Plan  continue supine and seated UE and LE exercises, NuStep, balance exercises in // bars    Consulted and Agree with Plan of Care  Patient       Patient will benefit from skilled therapeutic intervention in order to improve the following deficits and impairments:  Pain, Impaired sensation, Decreased range of motion, Decreased strength, Decreased activity tolerance, Decreased endurance, Decreased balance, Difficulty walking  Visit  Diagnosis: Muscle weakness (generalized)  Difficulty in walking, not elsewhere classified  Repeated falls     Problem List Patient Active Problem List   Diagnosis Date Noted  . 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 Dyslipidemia, goal LDL below 70 04/03/2014  . Aortic valve insufficiency, senile calcific   . Multiple myeloma in remission ([place]) 06/19/2013  . Moderate mitral regurgitation by prior echocardiogram 02/25/2013  . Mitral valve anterior leaflet prolapse 02/25/2013  . Hx of valvular heart disease - MVP with midl-mod MR, Mild AI with Aortic Sclerosis 02/16/2013  . CAD in native artery -- s/p CABG 02/16/2013  . Monoclonal gammopathy 09/01/2012  . Normochromic normocytic anemia 09/01/2012  . Leukopenia 09/01/2012  . Hx of CABG 08/02/2011  . Hyperlipidemia LDL goal <70 08/02/2011  . Allergic rhinitis, mild 08/02/2011  . Arthralgia of hand 10/11/2010  . Back pain 10/11/2010  . Fatigue 10/11/2010  . ED (erectile dysfunction) 10/11/2010    Otelia Limes, PTA 05/27/2018, 11:07 AM  [place], [place], [address] Phone: [phone]  Fax:  [phone]  Name: [name] MRN: [id number] Date of Birth: 1960/11/26

## 2018-05-29 ENCOUNTER — Encounter: Payer: Self-pay | Admitting: Physical Therapy

## 2018-05-29 ENCOUNTER — Other Ambulatory Visit: Payer: Self-pay

## 2018-05-29 ENCOUNTER — Ambulatory Visit: Payer: BLUE CROSS/BLUE SHIELD | Attending: Internal Medicine | Admitting: Physical Therapy

## 2018-05-29 ENCOUNTER — Encounter: Payer: Self-pay | Admitting: Family Medicine

## 2018-05-29 DIAGNOSIS — R296 Repeated falls: Secondary | ICD-10-CM | POA: Insufficient documentation

## 2018-05-29 DIAGNOSIS — R262 Difficulty in walking, not elsewhere classified: Secondary | ICD-10-CM

## 2018-05-29 DIAGNOSIS — M6281 Muscle weakness (generalized): Secondary | ICD-10-CM | POA: Insufficient documentation

## 2018-05-29 NOTE — Therapy (Signed)
[place], [place], [address] Phone: [phone]   Fax:  [phone]  Physical Therapy Treatment  Patient Details  Name: [name] MRN: [id number] Date of Birth: 1961/02/24 Referring Provider (PT): Valli Glance   Encounter Date: 05/29/2018  PT End of Session - 05/29/18 1002    Visit Number  6    Number of Visits  9    Date for PT Re-Evaluation  06/02/18    PT Start Time  0932    PT Stop Time  1002    PT Time Calculation (min)  30 min    Activity Tolerance  Patient limited by fatigue    Behavior During Therapy  [facility] for tasks assessed/performed       Past Medical History:  Diagnosis Date  . Anxiety   . Aortic valve insufficiency, senile calcific October 14   Moderate regurgitation, eccentric towards Ant MV Leaflet  . Bence Jones proteinuria 09/01/2012   Borderline increase protein 190 mg on 24 hr urine IFE with free kappa light chains (lab normal 50-100 mg)  08/18/12  . CAD in native artery    s/p CABG; -- Cards: Dr. Roni Bread, [facility]; [facility] 01/2013 EF 57%, No ischemia or Infarction.  . Erectile dysfunction   . Essential hypertension   . H/O cardiovascular stress test 02/22/10   normal perfusion, no ischemia or infarct; treadmill and Myoview perfusion study; Dr. Elisabeth Cara  . Hyperlipidemia LDL goal <70   . Leukopenia 09/01/2012   WBC 3,700 42 poly, 48 lymphs, 9 monos 08/18/12  Was 3,700 1 year ago  . 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 Moderate mitral regurgitation by prior echocardiogram October 2014   Echo 02/2013: Severe holosystolic Ant MV Leaflet prolapse, Moderate regurgitation.  Mildly dilated LV with normal systolic function (EF 57-90%), moderate LA dilation, upper normal PA pressures ~ 34 mmHg.  . Monoclonal gammopathy 09/01/2012   Increased total protein, increased beta globulin peak with decreased gammaglobulin peak on SPEP 08/18/12   IFE not done "possible faint abnormal band"  . Multiple myeloma, without mention of having achieved remission 10/14/2012   Stage I Durie-Salmon; good prognosis international scoring system: see 10/13/12 progress note --> ? in Remission as of 06/19/2013; ? Failed Auto BMT; Continues to be on Chemo  with planned Donor BMT  . Normochromic normocytic anemia 09/01/2012   Hb 12.7, MCV 95  08/18/12 was 13.7 1 year ago; concomitant leukopenia  & increased lymphs  . Pneumococcal vaccine refused 08/15/2012    Past Surgical History:  Procedure Laterality Date  . COLONOSCOPY     ?  . CORONARY ARTERY BYPASS GRAFT  10/2003   LIMA to LAD, RIMA to OM1, sequential to OM2; Dr. Elisabeth Cara    There were no vitals filed for this visit.  Subjective Assessment - 05/29/18 0932    Subjective  I felt good after last time. I was a little tired and sore.     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  Yes    Pain Score  7     Pain Location  Foot    Pain Orientation  Right;Left                       [facility] Adult PT Treatment/Exercise - 05/29/18 0001      Neuro Re-ed    Neuro Re-ed Details   In // bars briefly at end of session: Heel-toe front and then back 2x each way, then crossover in front 1x each direction, pt did not require any HHA with braiding but did require occasional CGA for heel/toe      Knee/Hip Exercises: Aerobic   Nustep  Level 6, x 6 mins, RPE 4-6/10      Knee/Hip Exercises: Seated   Long Arc Quad  Strengthening;Right;Left;2 sets;10 reps;Weights   VCs to hold briefly/slower pace   Long Arc Quad Weight  2 lbs.    Marching   Strengthening;Right;Left;2 sets;10 reps;Weights    Marching Weights  2 lbs.    Abduction/Adduction   Strengthening;Left;Right;2 sets;10 reps   blue theraband     Shoulder Exercises: Seated   Other Seated Exercises  Bil UE 3 way raises, 2 lbs x10 each with pt returning therapist demonstration of each.                   PT Long Term Goals - 05/13/18 1716      PT LONG TERM GOAL #1   Title  Pt will demonstrate 4/5 shoulder flexion strength to allow pt to return to prior level of function    Baseline  R 3+/5, L 3/5    Time  4    Period  Weeks    Status  New      PT LONG TERM GOAL #2   Title  Pt will demonstrate 4/5 bilateral hip flexion to decrease fall risks    Baseline  R 3+/5, L 3/5    Time  4    Period  Weeks    Status  New      PT LONG TERM GOAL #3   Title  Pt will report 0 falls for 3 weeks in a row     Time  4    Period  Weeks    Status  New      PT LONG TERM GOAL #4   Title  Pt will be able to sit to stand from chair at least 7 times in 30 seconds without use of UEs to decrease fall risk    Baseline  0     Time  4    Period  Weeks    Status  New      PT LONG TERM GOAL #5   Title  Pt will be independent in a home exercise program for continued strengthening and stretching    Time  4    Period  Weeks    Status  New      Additional Long Term Goals   Additional Long Term Goals  Yes      PT LONG TERM GOAL #6   Title  Pt will demonstrate 160 degrees of bilateral shoulder flexion to allow him to reach overhead    Baseline  R 125, L 140    Time  4    Period  Weeks    Status  New    Target Date  06/02/17            Plan - 05/29/18 1002  Clinical Impression Statement  Pt was able to tolerate increased time on the NuStep today at level 6 but was much more fatigued for the rest of the session. Stopped session early due to LE fatigue. Pt showed improvement with balance in parallel bars and required less assist today to maintain balance.     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    PT Frequency  2x / week    PT Duration  4 weeks    PT Treatment/Interventions  ADLs/Self Care Home Management;Therapeutic exercise;Therapeutic activities;Patient/family education;Manual techniques;Passive range of motion;Gait training;Stair training    PT Next Visit Plan  continue supine and seated UE and LE exercises, NuStep, balance exercises in // bars    PT Home Exercise Plan  walking program, shoulder dowel exercises, Access Code: [email or web address]    Consulted and Agree with Plan of Care  Patient       Patient will benefit from skilled therapeutic intervention in order to improve the following deficits and impairments:  Pain, Impaired sensation, Decreased range of motion, Decreased strength, Decreased activity tolerance, Decreased endurance, Decreased balance, Difficulty walking  Visit Diagnosis: Muscle weakness (generalized)  Difficulty in walking, not elsewhere classified  Repeated falls     Problem List Patient Active Problem List   Diagnosis Date Noted  . 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 Dyslipidemia, goal LDL below 70 04/03/2014  . Aortic valve insufficiency, senile calcific   . Multiple myeloma in remission ([place]) 06/19/2013  . Moderate mitral regurgitation by prior echocardiogram 02/25/2013  . Mitral valve anterior leaflet prolapse 02/25/2013  . Hx of valvular heart disease - MVP with midl-mod MR, Mild AI with Aortic Sclerosis 02/16/2013  . CAD in native artery -- s/p CABG 02/16/2013  . Monoclonal gammopathy 09/01/2012  . Normochromic normocytic anemia 09/01/2012  . Leukopenia 09/01/2012  . Hx of CABG 08/02/2011  . Hyperlipidemia LDL goal <70 08/02/2011  . Allergic rhinitis, mild 08/02/2011  . Arthralgia of hand 10/11/2010  . Back pain 10/11/2010  . Fatigue 10/11/2010  . ED (erectile dysfunction) 10/11/2010    Allyson Sabal [facility] 05/29/2018, 10:06  AM  [place], [place], [address] Phone: [phone]   Fax:  [phone]  Name: [name] MRN: [id number] Date of Birth: [birth year]  Manus Gunning, PT 05/29/18 10:06 AM

## 2018-05-30 ENCOUNTER — Encounter: Payer: Self-pay | Admitting: Family Medicine

## 2018-06-03 ENCOUNTER — Other Ambulatory Visit: Payer: Self-pay

## 2018-06-03 ENCOUNTER — Encounter: Payer: Self-pay | Admitting: Physical Therapy

## 2018-06-03 ENCOUNTER — Ambulatory Visit: Payer: BLUE CROSS/BLUE SHIELD | Admitting: Physical Therapy

## 2018-06-03 DIAGNOSIS — R296 Repeated falls: Secondary | ICD-10-CM

## 2018-06-03 DIAGNOSIS — R262 Difficulty in walking, not elsewhere classified: Secondary | ICD-10-CM | POA: Diagnosis not present

## 2018-06-03 DIAGNOSIS — M6281 Muscle weakness (generalized): Secondary | ICD-10-CM

## 2018-06-03 NOTE — Therapy (Addendum)
[place], [place], [address] Phone: [phone]   Fax:  [phone]  Physical Therapy Treatment  Patient Details  Name: [name] MRN: [id number] Date of Birth: [birth year] Referring Provider (PT): Valli Glance   Encounter Date: 06/03/2018  PT End of Session - 06/03/18 1100    Visit Number  7    Number of Visits  17    Date for PT Re-Evaluation  07/01/18    Authorization Type  pt/ot/chiro combined    Authorization - Visit Number  2    Authorization - Number of Visits  30    PT Start Time  6433    PT Stop Time  1057    PT Time Calculation (min)  39 min    Activity Tolerance  Patient tolerated treatment well    Behavior During Therapy  [facility] for tasks assessed/performed       Past Medical History:  Diagnosis Date  . Anxiety   . Aortic valve insufficiency, senile calcific October 14   Moderate regurgitation, eccentric towards Ant MV Leaflet  . Bence Jones proteinuria 09/01/2012   Borderline increase protein 190 mg on 24 hr urine IFE with free kappa light chains (lab normal 50-100 mg)  08/18/12  . CAD in native artery    s/p CABG; -- Cards: Dr. Roni Bread, [facility]; [facility] 01/2013 EF 57%, No ischemia or Infarction.  . Erectile dysfunction   . Essential hypertension   . H/O cardiovascular stress test 02/22/10   normal perfusion, no ischemia or infarct; treadmill and Myoview perfusion study; Dr. Elisabeth Cara  . Hyperlipidemia LDL goal <70   . Leukopenia 09/01/2012   WBC 3,700 42 poly, 48 lymphs, 9 monos 08/18/12  Was 3,700 1 year ago  . 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 Moderate mitral regurgitation by prior echocardiogram October 2014   Echo 02/2013: Severe holosystolic Ant MV Leaflet prolapse, Moderate regurgitation.  Mildly dilated LV with normal systolic function (EF 29-51%), moderate LA dilation, upper normal PA pressures ~ 34 mmHg.  . Monoclonal  gammopathy 09/01/2012   Increased total protein, increased beta globulin peak with decreased gammaglobulin peak on SPEP 08/18/12  IFE not done "possible faint abnormal band"  . Multiple myeloma, without mention of having achieved remission 10/14/2012   Stage I Durie-Salmon; good prognosis international scoring system: see 10/13/12 progress note --> ? in Remission as of 06/19/2013; ? Failed Auto BMT; Continues to be on Chemo  with planned Donor BMT  . Normochromic normocytic anemia 09/01/2012   Hb 12.7, MCV 95  08/18/12 was 13.7 1 year ago; concomitant leukopenia  & increased lymphs  . Pneumococcal vaccine refused 08/15/2012    Past Surgical History:  Procedure Laterality Date  . COLONOSCOPY     ?  . CORONARY ARTERY BYPASS GRAFT  10/2003   LIMA to LAD, RIMA to OM1, sequential to OM2; Dr. Elisabeth Cara    There were no vitals filed for this visit.  Subjective Assessment - 06/03/18 1020    Subjective  It is raining today so bad neuropathy.     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  Yes    Pain Score  8     Pain Location  Foot    Pain Orientation  Right;Left                       [facility] Adult PT Treatment/Exercise - 06/03/18 0001      Neuro Re-ed    Neuro Re-ed Details   down hall x 2 for braiding, pt able to maintain balance with only occasional assist from wall, then heel toe x 2 with pt loosing balane occasionally requiring support from wall and therapist      Knee/Hip Exercises: Aerobic   Stationary Bike  Level 1 x 4 min RPE- 7-8/10      Knee/Hip Exercises: Standing   Other Standing Knee Exercises  4 way hip  holding on to back of bike with yellow theraband, pt returned therapist demo x 10 reps each, v/c to keep knee straight      Knee/Hip Exercises: Seated   Long Arc Quad  Strengthening;Right;Left;2 sets;10 reps;Weights   VCs to hold briefly/slower pace   Long Arc Quad Weight  2 lbs.    Marching  Strengthening;Right;Left;2 sets;10 reps;Weights    Marching Limitations  v/c to prevent left hip ER    Marching Weights  2 lbs.    Abduction/Adduction   Strengthening;Left;Right;10 reps;1 set   blue theraband, 20 reps     Shoulder Exercises: Seated   Other Seated Exercises  Bil UE 3 way raises, 3 lbs x10 each with pt returning therapist demonstration of each.                   PT Long Term Goals - 06/03/18 1101      PT LONG TERM GOAL #1   Title  Pt will demonstrate 4/5 shoulder flexion strength to allow pt to return to prior level of function    Baseline  R 3+/5, L 3/5    Time  4    Period  Weeks    Status  On-going      PT LONG TERM GOAL #2   Title  Pt will demonstrate 4/5 bilateral hip flexion to decrease fall risks    Baseline  R 3+/5, L 3/5    Time  4    Period  Weeks    Status  On-going      PT LONG TERM GOAL #3   Title  Pt will report 0 falls for 3 weeks in a row     Time  4    Period  Weeks    Status  On-going      PT LONG TERM GOAL #4   Title  Pt will be able to sit to stand from chair at least 7 times in 30 seconds without use of UEs to decrease fall risk    Baseline  0     Time  4    Period  Weeks    Status  On-going      PT LONG TERM GOAL #5   Title  Pt will be independent in a home exercise program for continued strengthening and stretching    Time  4    Period  Weeks    Status  On-going      PT LONG TERM GOAL #6   Title  Pt will demonstrate 160 degrees of bilateral shoulder flexion to  allow him to reach overhead    Baseline  R 125, L 140    Time  4    Period  Weeks    Status  On-going            Plan - 06/03/18 1102    Clinical Impression  Statement  Pt is tolerating increased resistance today with 3 way shoulder raises. Added 4 way hip with yellow theraband and issued pt theraband to use at home for this. Worked on braiding and heel toe gait to improve balance outside of parallel bars today with pt finding this more challenging. Pt would benefit from additional skilled PT visits to continue to improve strength, ROM, decrease fall risk and improve mobility.     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    PT Frequency  2x / week    PT Duration  4 weeks    PT Treatment/Interventions  ADLs/Self Care Home Management;Therapeutic exercise;Therapeutic activities;Patient/family education;Manual techniques;Passive range of motion;Gait training;Stair training    PT Next Visit Plan  assess goals, continue supine and seated UE and LE exercises, NuStep, balance exercises in // bars    PT Home Exercise Plan  walking program, shoulder dowel exercises, Access Code: [email or web address]    Consulted and Agree with Plan of Care  Patient       Patient will benefit from skilled therapeutic intervention in order to improve the following deficits and impairments:  Pain, Impaired sensation, Decreased range of motion, Decreased strength, Decreased activity tolerance, Decreased endurance, Decreased balance, Difficulty walking  Visit Diagnosis: Muscle weakness (generalized)  Difficulty in walking, not elsewhere classified  Repeated falls     Problem List Patient Active Problem List   Diagnosis Date Noted  . 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 Dyslipidemia, goal LDL below 70 04/03/2014  . Aortic valve insufficiency, senile calcific   . Multiple myeloma in remission ([place]) 06/19/2013  . Moderate mitral regurgitation by prior echocardiogram 02/25/2013  . Mitral valve anterior leaflet prolapse 02/25/2013  . Hx of valvular heart disease - MVP with midl-mod MR, Mild AI with Aortic Sclerosis  02/16/2013  . CAD in native artery -- s/p CABG 02/16/2013  . Monoclonal gammopathy 09/01/2012  . Normochromic normocytic anemia 09/01/2012  . Leukopenia 09/01/2012  . Hx of CABG 08/02/2011  . Hyperlipidemia LDL goal <70 08/02/2011  . Allergic rhinitis, mild 08/02/2011  . Arthralgia of hand 10/11/2010  . Back pain 10/11/2010  . Fatigue 10/11/2010  . ED (erectile dysfunction) 10/11/2010    Allyson Sabal [facility] 06/03/2018, 11:05 AM  [place], [place], [address] Phone: [phone]   Fax:  [phone]  Name: MOHAMAD BRUSO MRN: [id number] Date of Birth: [birth year]  Manus Gunning, PT 06/03/18 11:05 AM  PHYSICAL THERAPY DISCHARGE SUMMARY  Visits from Start of Care: 7  Current functional level related to goals / functional outcomes: See above   Remaining deficits: See above   Education / Equipment: HEP  Plan: Patient agrees to discharge.  Patient goals were not met. Patient is being discharged due to not returning since the last visit.  ?????    [facility] [place], [place] 09/30/18 10:22 AM

## 2018-06-06 ENCOUNTER — Encounter: Payer: BLUE CROSS/BLUE SHIELD | Admitting: Physical Therapy

## 2018-06-06 DIAGNOSIS — T8609 Other complications of bone marrow transplant: Secondary | ICD-10-CM | POA: Diagnosis not present

## 2018-06-06 DIAGNOSIS — Z7901 Long term (current) use of anticoagulants: Secondary | ICD-10-CM | POA: Diagnosis not present

## 2018-06-06 DIAGNOSIS — I10 Essential (primary) hypertension: Secondary | ICD-10-CM | POA: Diagnosis not present

## 2018-06-06 DIAGNOSIS — R0602 Shortness of breath: Secondary | ICD-10-CM | POA: Diagnosis not present

## 2018-06-06 DIAGNOSIS — Z951 Presence of aortocoronary bypass graft: Secondary | ICD-10-CM | POA: Diagnosis not present

## 2018-06-06 DIAGNOSIS — T380X5A Adverse effect of glucocorticoids and synthetic analogues, initial encounter: Secondary | ICD-10-CM | POA: Diagnosis not present

## 2018-06-06 DIAGNOSIS — R739 Hyperglycemia, unspecified: Secondary | ICD-10-CM | POA: Diagnosis not present

## 2018-06-06 DIAGNOSIS — G629 Polyneuropathy, unspecified: Secondary | ICD-10-CM | POA: Diagnosis not present

## 2018-06-06 DIAGNOSIS — M109 Gout, unspecified: Secondary | ICD-10-CM | POA: Diagnosis not present

## 2018-06-06 DIAGNOSIS — D89811 Chronic graft-versus-host disease: Secondary | ICD-10-CM | POA: Diagnosis not present

## 2018-06-06 DIAGNOSIS — Z9481 Bone marrow transplant status: Secondary | ICD-10-CM | POA: Diagnosis not present

## 2018-06-06 DIAGNOSIS — I2510 Atherosclerotic heart disease of native coronary artery without angina pectoris: Secondary | ICD-10-CM | POA: Diagnosis not present

## 2018-06-06 DIAGNOSIS — C9000 Multiple myeloma not having achieved remission: Secondary | ICD-10-CM | POA: Diagnosis not present

## 2018-06-06 DIAGNOSIS — I2699 Other pulmonary embolism without acute cor pulmonale: Secondary | ICD-10-CM | POA: Diagnosis not present

## 2018-06-06 DIAGNOSIS — C9001 Multiple myeloma in remission: Secondary | ICD-10-CM | POA: Diagnosis not present

## 2018-06-06 DIAGNOSIS — D509 Iron deficiency anemia, unspecified: Secondary | ICD-10-CM | POA: Diagnosis not present

## 2018-06-06 DIAGNOSIS — Z8249 Family history of ischemic heart disease and other diseases of the circulatory system: Secondary | ICD-10-CM | POA: Diagnosis not present

## 2018-06-06 DIAGNOSIS — I341 Nonrheumatic mitral (valve) prolapse: Secondary | ICD-10-CM | POA: Diagnosis not present

## 2018-06-06 DIAGNOSIS — Z86718 Personal history of other venous thrombosis and embolism: Secondary | ICD-10-CM | POA: Diagnosis not present

## 2018-06-06 DIAGNOSIS — I2609 Other pulmonary embolism with acute cor pulmonale: Secondary | ICD-10-CM | POA: Diagnosis not present

## 2018-06-07 DIAGNOSIS — Z951 Presence of aortocoronary bypass graft: Secondary | ICD-10-CM | POA: Diagnosis not present

## 2018-06-07 DIAGNOSIS — I10 Essential (primary) hypertension: Secondary | ICD-10-CM | POA: Diagnosis not present

## 2018-06-07 DIAGNOSIS — Z86718 Personal history of other venous thrombosis and embolism: Secondary | ICD-10-CM | POA: Diagnosis not present

## 2018-06-07 DIAGNOSIS — D89811 Chronic graft-versus-host disease: Secondary | ICD-10-CM | POA: Diagnosis not present

## 2018-06-07 DIAGNOSIS — M109 Gout, unspecified: Secondary | ICD-10-CM | POA: Diagnosis not present

## 2018-06-07 DIAGNOSIS — R739 Hyperglycemia, unspecified: Secondary | ICD-10-CM | POA: Diagnosis not present

## 2018-06-07 DIAGNOSIS — C9001 Multiple myeloma in remission: Secondary | ICD-10-CM | POA: Diagnosis not present

## 2018-06-07 DIAGNOSIS — I2510 Atherosclerotic heart disease of native coronary artery without angina pectoris: Secondary | ICD-10-CM | POA: Diagnosis not present

## 2018-06-07 DIAGNOSIS — G629 Polyneuropathy, unspecified: Secondary | ICD-10-CM | POA: Diagnosis not present

## 2018-06-07 DIAGNOSIS — Z8249 Family history of ischemic heart disease and other diseases of the circulatory system: Secondary | ICD-10-CM | POA: Diagnosis not present

## 2018-06-07 DIAGNOSIS — T380X5A Adverse effect of glucocorticoids and synthetic analogues, initial encounter: Secondary | ICD-10-CM | POA: Diagnosis not present

## 2018-06-07 DIAGNOSIS — I2609 Other pulmonary embolism with acute cor pulmonale: Secondary | ICD-10-CM | POA: Diagnosis not present

## 2018-06-07 DIAGNOSIS — D509 Iron deficiency anemia, unspecified: Secondary | ICD-10-CM | POA: Diagnosis not present

## 2018-06-07 DIAGNOSIS — Z7901 Long term (current) use of anticoagulants: Secondary | ICD-10-CM | POA: Diagnosis not present

## 2018-06-07 DIAGNOSIS — I341 Nonrheumatic mitral (valve) prolapse: Secondary | ICD-10-CM | POA: Diagnosis not present

## 2018-06-07 DIAGNOSIS — I517 Cardiomegaly: Secondary | ICD-10-CM | POA: Diagnosis not present

## 2018-06-07 DIAGNOSIS — T8609 Other complications of bone marrow transplant: Secondary | ICD-10-CM | POA: Diagnosis not present

## 2018-06-07 DIAGNOSIS — I2699 Other pulmonary embolism without acute cor pulmonale: Secondary | ICD-10-CM | POA: Diagnosis not present

## 2018-06-07 DIAGNOSIS — Z9481 Bone marrow transplant status: Secondary | ICD-10-CM | POA: Diagnosis not present

## 2018-06-07 DIAGNOSIS — I82412 Acute embolism and thrombosis of left femoral vein: Secondary | ICD-10-CM | POA: Diagnosis not present

## 2018-06-07 DIAGNOSIS — R0602 Shortness of breath: Secondary | ICD-10-CM | POA: Diagnosis not present

## 2018-06-08 DIAGNOSIS — I2510 Atherosclerotic heart disease of native coronary artery without angina pectoris: Secondary | ICD-10-CM | POA: Diagnosis not present

## 2018-06-08 DIAGNOSIS — D509 Iron deficiency anemia, unspecified: Secondary | ICD-10-CM | POA: Diagnosis not present

## 2018-06-08 DIAGNOSIS — G629 Polyneuropathy, unspecified: Secondary | ICD-10-CM | POA: Diagnosis not present

## 2018-06-08 DIAGNOSIS — Z8249 Family history of ischemic heart disease and other diseases of the circulatory system: Secondary | ICD-10-CM | POA: Diagnosis not present

## 2018-06-08 DIAGNOSIS — D89811 Chronic graft-versus-host disease: Secondary | ICD-10-CM | POA: Diagnosis not present

## 2018-06-08 DIAGNOSIS — Z951 Presence of aortocoronary bypass graft: Secondary | ICD-10-CM | POA: Diagnosis not present

## 2018-06-08 DIAGNOSIS — C9001 Multiple myeloma in remission: Secondary | ICD-10-CM | POA: Diagnosis not present

## 2018-06-08 DIAGNOSIS — Z9481 Bone marrow transplant status: Secondary | ICD-10-CM | POA: Diagnosis not present

## 2018-06-08 DIAGNOSIS — M109 Gout, unspecified: Secondary | ICD-10-CM | POA: Diagnosis not present

## 2018-06-08 DIAGNOSIS — I10 Essential (primary) hypertension: Secondary | ICD-10-CM | POA: Diagnosis not present

## 2018-06-08 DIAGNOSIS — T8609 Other complications of bone marrow transplant: Secondary | ICD-10-CM | POA: Diagnosis not present

## 2018-06-08 DIAGNOSIS — I341 Nonrheumatic mitral (valve) prolapse: Secondary | ICD-10-CM | POA: Diagnosis not present

## 2018-06-08 DIAGNOSIS — Z7901 Long term (current) use of anticoagulants: Secondary | ICD-10-CM | POA: Diagnosis not present

## 2018-06-08 DIAGNOSIS — R0602 Shortness of breath: Secondary | ICD-10-CM | POA: Diagnosis not present

## 2018-06-08 DIAGNOSIS — I2609 Other pulmonary embolism with acute cor pulmonale: Secondary | ICD-10-CM | POA: Diagnosis not present

## 2018-06-08 DIAGNOSIS — Z86718 Personal history of other venous thrombosis and embolism: Secondary | ICD-10-CM | POA: Diagnosis not present

## 2018-06-08 DIAGNOSIS — T380X5A Adverse effect of glucocorticoids and synthetic analogues, initial encounter: Secondary | ICD-10-CM | POA: Diagnosis not present

## 2018-06-08 DIAGNOSIS — R739 Hyperglycemia, unspecified: Secondary | ICD-10-CM | POA: Diagnosis not present

## 2018-06-09 ENCOUNTER — Encounter: Payer: BLUE CROSS/BLUE SHIELD | Admitting: Physical Therapy

## 2018-06-11 DIAGNOSIS — C9001 Multiple myeloma in remission: Secondary | ICD-10-CM | POA: Diagnosis not present

## 2018-06-11 DIAGNOSIS — Z006 Encounter for examination for normal comparison and control in clinical research program: Secondary | ICD-10-CM | POA: Diagnosis not present

## 2018-06-11 DIAGNOSIS — T380X5A Adverse effect of glucocorticoids and synthetic analogues, initial encounter: Secondary | ICD-10-CM | POA: Diagnosis not present

## 2018-06-11 DIAGNOSIS — R1310 Dysphagia, unspecified: Secondary | ICD-10-CM | POA: Diagnosis not present

## 2018-06-11 DIAGNOSIS — K30 Functional dyspepsia: Secondary | ICD-10-CM | POA: Diagnosis not present

## 2018-06-11 DIAGNOSIS — T865 Complications of stem cell transplant: Secondary | ICD-10-CM | POA: Diagnosis not present

## 2018-06-11 DIAGNOSIS — K1370 Unspecified lesions of oral mucosa: Secondary | ICD-10-CM | POA: Diagnosis not present

## 2018-06-11 DIAGNOSIS — G629 Polyneuropathy, unspecified: Secondary | ICD-10-CM | POA: Diagnosis not present

## 2018-06-11 DIAGNOSIS — D509 Iron deficiency anemia, unspecified: Secondary | ICD-10-CM | POA: Diagnosis not present

## 2018-06-11 DIAGNOSIS — M109 Gout, unspecified: Secondary | ICD-10-CM | POA: Diagnosis not present

## 2018-06-11 DIAGNOSIS — I2510 Atherosclerotic heart disease of native coronary artery without angina pectoris: Secondary | ICD-10-CM | POA: Diagnosis not present

## 2018-06-11 DIAGNOSIS — R739 Hyperglycemia, unspecified: Secondary | ICD-10-CM | POA: Diagnosis not present

## 2018-06-11 DIAGNOSIS — D89811 Chronic graft-versus-host disease: Secondary | ICD-10-CM | POA: Diagnosis not present

## 2018-06-11 DIAGNOSIS — Z951 Presence of aortocoronary bypass graft: Secondary | ICD-10-CM | POA: Diagnosis not present

## 2018-06-11 DIAGNOSIS — H5789 Other specified disorders of eye and adnexa: Secondary | ICD-10-CM | POA: Diagnosis not present

## 2018-06-11 DIAGNOSIS — R0602 Shortness of breath: Secondary | ICD-10-CM | POA: Diagnosis not present

## 2018-06-11 DIAGNOSIS — C9000 Multiple myeloma not having achieved remission: Secondary | ICD-10-CM | POA: Diagnosis not present

## 2018-06-12 ENCOUNTER — Encounter: Payer: BLUE CROSS/BLUE SHIELD | Admitting: Physical Therapy

## 2018-07-17 DIAGNOSIS — G629 Polyneuropathy, unspecified: Secondary | ICD-10-CM | POA: Diagnosis not present

## 2018-07-17 DIAGNOSIS — Z9481 Bone marrow transplant status: Secondary | ICD-10-CM | POA: Diagnosis not present

## 2018-07-17 DIAGNOSIS — D89811 Chronic graft-versus-host disease: Secondary | ICD-10-CM | POA: Diagnosis not present

## 2018-07-17 DIAGNOSIS — Z006 Encounter for examination for normal comparison and control in clinical research program: Secondary | ICD-10-CM | POA: Diagnosis not present

## 2018-07-17 DIAGNOSIS — C9000 Multiple myeloma not having achieved remission: Secondary | ICD-10-CM | POA: Diagnosis not present

## 2018-07-17 DIAGNOSIS — C9001 Multiple myeloma in remission: Secondary | ICD-10-CM | POA: Diagnosis not present

## 2018-07-17 DIAGNOSIS — Z86711 Personal history of pulmonary embolism: Secondary | ICD-10-CM | POA: Diagnosis not present

## 2018-07-25 DIAGNOSIS — Z7901 Long term (current) use of anticoagulants: Secondary | ICD-10-CM | POA: Diagnosis not present

## 2018-08-11 ENCOUNTER — Emergency Department
Admission: EM | Admit: 2018-08-11 | Discharge: 2018-08-11 | Disposition: A | Discharge status: Home / Self Care | Payer: BLUE CROSS/BLUE SHIELD | Attending: Student in an Organized Health Care Education/Training Program | Admitting: Student in an Organized Health Care Education/Training Program

## 2018-08-11 ENCOUNTER — Other Ambulatory Visit: Payer: Self-pay

## 2018-08-11 ENCOUNTER — Encounter: Payer: Self-pay | Admitting: Emergency Medicine

## 2018-08-11 ENCOUNTER — Emergency Department: Payer: BLUE CROSS/BLUE SHIELD

## 2018-08-11 DIAGNOSIS — R519 Headache, unspecified: Secondary | ICD-10-CM

## 2018-08-11 DIAGNOSIS — R51 Headache: Secondary | ICD-10-CM | POA: Diagnosis not present

## 2018-08-11 DIAGNOSIS — Z7901 Long term (current) use of anticoagulants: Secondary | ICD-10-CM | POA: Insufficient documentation

## 2018-08-11 DIAGNOSIS — Z79899 Other long term (current) drug therapy: Secondary | ICD-10-CM | POA: Insufficient documentation

## 2018-08-11 DIAGNOSIS — Z7982 Long term (current) use of aspirin: Secondary | ICD-10-CM | POA: Diagnosis not present

## 2018-08-11 DIAGNOSIS — H40051 Ocular hypertension, right eye: Secondary | ICD-10-CM | POA: Insufficient documentation

## 2018-08-11 MED ORDER — PROCHLORPERAZINE MALEATE 10 MG PO TABS
10.0000 mg | ORAL_TABLET | Freq: Once | ORAL | Status: AC
  Administered 2018-08-11: 10 mg via ORAL
  Filled 2018-08-11 (×2): qty 1

## 2018-08-11 MED ORDER — ACETAMINOPHEN 500 MG PO TABS
1000.0000 mg | ORAL_TABLET | Freq: Once | ORAL | Status: AC
  Administered 2018-08-11: 1000 mg via ORAL
  Filled 2018-08-11 (×2): qty 2

## 2018-08-11 MED ORDER — BRIMONIDINE TARTRATE 0.15 % OP SOLN
1.0000 [drp] | Freq: Two times a day (BID) | OPHTHALMIC | Status: DC
  Filled 2018-08-11: qty 5

## 2018-08-11 MED ORDER — DIPHENHYDRAMINE HCL 25 MG PO CAPS
25.0000 mg | ORAL_CAPSULE | Freq: Once | ORAL | Status: DC
  Filled 2018-08-11: qty 1

## 2018-08-11 MED ORDER — TETRACAINE HCL 0.5 % OP SOLN
2.0000 [drp] | Freq: Once | OPHTHALMIC | Status: AC
  Administered 2018-08-11: 2 [drp] via OPHTHALMIC
  Filled 2018-08-11: qty 4

## 2018-08-11 MED ORDER — BRIMONIDINE TARTRATE 0.2 % OP SOLN
1.0000 [drp] | Freq: Two times a day (BID) | OPHTHALMIC | Status: DC
  Administered 2018-08-11: 1 [drp] via OPHTHALMIC
  Filled 2018-08-11: qty 5

## 2018-08-11 NOTE — ED Notes (Signed)
Patient transported to CT 

## 2018-08-11 NOTE — ED Notes (Signed)
Pt stated that he has been having a headache on and off for the past 1.5 weeks. Pt recently started taking lovenox and his PCP stated that he needed to come to the ED to get a head CT to check for a bleed. Respirations even/unlabored, skin warm/dry, A&OX4

## 2018-08-11 NOTE — ED Triage Notes (Signed)
Patient presents to the ED with severe headaches x 1.5 weeks.  Patient takes 80mg  of lovenox 2x day.  Patient states his PCP wanted him to come to the ED to make sure he didn't have a head bleed due to the headaches.  Patient is in no obvious distress at this time.

## 2018-08-11 NOTE — ED Provider Notes (Signed)
[facility] Emergency Department Provider Note    First MD Initiated Contact with Patient 08/11/18 2123     (approximate)  I have reviewed the triage vital signs and the nursing notes.   HISTORY  Chief Complaint Headache    HPI [name] is a 58 y.o. male has a past medical history as described below presents the ER for right-sided headache for the past several days.  He was directed to the ER by his PCP who was concerned about head bleed as he is on Lovenox for history of PE DVT.  Is he has extensive ophthalmologic history as well.  Has headaches associated when his ocular pressure increase.  States the pain is mild to moderate in severity.    Past Medical History:  Diagnosis Date  . Anxiety   . Aortic valve insufficiency, senile calcific October 14   Moderate regurgitation, eccentric towards Ant MV Leaflet  . Bence Jones proteinuria 09/01/2012   Borderline increase protein 190 mg on 24 hr urine IFE with free kappa light chains (lab normal 50-100 mg)  08/18/12  . CAD in native artery    s/p CABG; -- Cards: Dr. Roni Bread, [facility]; [facility] 01/2013 EF 57%, No ischemia or Infarction.  . Erectile dysfunction   . Essential hypertension   . H/O cardiovascular stress test 02/22/10   normal perfusion, no ischemia or infarct; treadmill and Myoview perfusion study; Dr. Elisabeth Cara  . Hyperlipidemia LDL goal <70   . Leukopenia 09/01/2012   WBC 3,700 42 poly, 48 lymphs, 9 monos 08/18/12  Was 3,700 1 year ago  . 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 Moderate mitral regurgitation by prior echocardiogram October 2014   Echo 02/2013: Severe holosystolic Ant MV Leaflet prolapse, Moderate regurgitation.  Mildly dilated LV with normal systolic function (EF 44-01%), moderate LA dilation, upper normal PA pressures ~ 34 mmHg.  . Monoclonal gammopathy 09/01/2012   Increased total protein, increased beta globulin peak with decreased  gammaglobulin peak on SPEP 08/18/12  IFE not done "possible faint abnormal band"  . Multiple myeloma, without mention of having achieved remission 10/14/2012   Stage I [place]; good prognosis international scoring system: see 10/13/12 progress note --> ? in Remission as of 06/19/2013; ? Failed Auto BMT; Continues to be on Chemo  with planned Donor BMT  . Normochromic normocytic anemia 09/01/2012   Hb 12.7, MCV 95  08/18/12 was 13.7 1 year ago; concomitant leukopenia  & increased lymphs  . Pneumococcal vaccine refused 08/15/2012   Family History  Problem Relation Age of Onset  . Heart disease Father   . Hypertension Father   . Heart failure Father   . Heart attack Brother        at 52  . Heart attack Brother        at 61   Past Surgical History:  Procedure Laterality Date  . COLONOSCOPY     ?  . CORONARY ARTERY BYPASS GRAFT  10/2003   LIMA to LAD, RIMA to OM1, sequential to OM2; Dr. Elisabeth Cara   Patient Active Problem List   Diagnosis Date Noted  . 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 Dyslipidemia, goal LDL below 70 04/03/2014  . Aortic valve insufficiency, senile calcific   . Multiple myeloma in remission ([place]) 06/19/2013  . Moderate mitral regurgitation by prior echocardiogram 02/25/2013  . Mitral valve anterior leaflet prolapse 02/25/2013  . Hx of valvular heart disease -  MVP with midl-mod MR, Mild AI with Aortic Sclerosis 02/16/2013  . CAD in native artery -- s/p CABG 02/16/2013  . Monoclonal gammopathy 09/01/2012  . Normochromic normocytic anemia 09/01/2012  . Leukopenia 09/01/2012  . Hx of CABG 08/02/2011  . Hyperlipidemia LDL goal <70 08/02/2011  . Allergic rhinitis, mild 08/02/2011  . Arthralgia of hand 10/11/2010  . Back pain 10/11/2010  . Fatigue 10/11/2010  . ED (erectile dysfunction) 10/11/2010      Prior to Admission medications   Medication Sig Start Date End Date Taking? Authorizing Provider  acyclovir (ZOVIRAX) 400 MG tablet TAKE ONE TABLET  BY MOUTH TWICE DAILY    Granfortuna, Alyson Locket, MD  albuterol (PROVENTIL HFA;VENTOLIN HFA) 108 (90 BASE) MCG/ACT inhaler Inhale 2 puffs into the lungs every 4 (four) hours as needed for wheezing or shortness of breath (or cough). Patient not taking: Reported on 05/05/2018 03/14/15   Tysinger, Camelia Eng, PA-C  allopurinol (ZYLOPRIM) 100 MG tablet Take 100 mg by mouth daily.    [provider]  amLODipine (NORVASC) 5 MG tablet Take 5 mg by mouth daily.    [provider]  Apixaban (ELIQUIS PO) Take 1 tablet by mouth 2 (two) times daily.    [provider]  aspirin 81 MG tablet Take 81 mg by mouth daily.      [provider]  azelastine (ASTELIN) 0.1 % nasal spray Place 2 sprays into both nostrils 2 (two) times daily. Use in each nostril as directed Patient not taking: Reported on 05/05/2018 01/03/16   Denita Lung, MD  cholecalciferol (VITAMIN D) 1000 units tablet Take 1,000 Units by mouth once a week.    [provider]  cycloSPORINE (RESTASIS) 0.05 % ophthalmic emulsion Place 1 drop into both eyes 2 (two) times daily.    [provider]  docusate sodium (COLACE) 100 MG capsule Take 200 mg by mouth 2 (two) times daily.    [provider]  dorzolamide-timolol (COSOPT) 22.3-6.8 MG/ML ophthalmic solution 1 drop 2 (two) times daily.    [provider]  DULoxetine (CYMBALTA) 30 MG capsule Take 30 mg by mouth daily.    [provider]  ezetimibe-simvastatin (VYTORIN) 10-20 MG tablet Take 1 tablet by mouth daily.    [provider]  lisinopril (PRINIVIL,ZESTRIL) 2.5 MG tablet Take 2.5 mg by mouth daily.    [provider]  Loratadine (CLARITIN PO) Take by mouth daily.    [provider]  mometasone (NASONEX) 50 MCG/ACT nasal spray Place 2 sprays into the nose as needed. 09/25/12   Denita Lung, MD  PENICILLIN V POTASSIUM PO Take 500 mg by mouth 2 (two) times daily.    [provider]   polyethylene glycol (MIRALAX / GLYCOLAX) packet Take 17 g by mouth daily.    [provider]  POSACONAZOLE PO Take 300 mLs by mouth daily.    [provider]  predniSONE (DELTASONE) 5 MG tablet Take 5 mg by mouth daily with breakfast.    [provider]  pregabalin (LYRICA) 100 MG capsule Take 100 mg by mouth 2 (two) times daily.    [provider]  sildenafil (REVATIO) 20 MG tablet Take 1 to 5 pills as needed Patient not taking: Reported on 05/05/2018 11/29/16   Denita Lung, MD  sulfamethoxazole-trimethoprim (BACTRIM,SEPTRA) 400-80 MG tablet Take 1 tablet by mouth daily.    [provider]  tacrolimus (PROGRAF) 5 MG capsule Take 5 mg by mouth 2 (two) times daily.  [provider]  Tafluprost, PF, (ZIOPTAN) 0.0015 % SOLN Apply to eye.    [provider]  tobramycin (TOBREX) 0.3 % ophthalmic solution Place 1 drop into both eyes 3 (three) times daily.    [provider]  triamcinolone cream (KENALOG) 0.1 % Apply 1 application topically 2 (two) times daily. 11/29/16   Denita Lung, MD    Allergies Toprol xl [metoprolol tartrate]    Social History Social History   Tobacco Use  . Smoking status: Never Smoker  . Smokeless tobacco: Never Used  Substance Use Topics  . Alcohol use: No  . Drug use: No    Review of Systems Patient denies headaches, rhinorrhea, blurry vision, numbness, shortness of breath, chest pain, edema, cough, abdominal pain, nausea, vomiting, diarrhea, dysuria, fevers, rashes or hallucinations unless otherwise stated above in HPI. ____________________________________________   PHYSICAL EXAM:  VITAL SIGNS: Vitals:   08/11/18 1843  BP: 136/83  Pulse: 73  Resp: 18  Temp: 98.3 F (36.8 C)  SpO2: 98%    Constitutional: Alert and oriented.  Eyes: Conjunctivae are dry bilaterally, no hyphema or hypopion  Head: Atraumatic. Nose: No congestion/rhinnorhea. Mouth/Throat: Mucous membranes  are moist.   Neck: No stridor. Painless ROM.  Cardiovascular: Normal rate, regular rhythm. Grossly normal heart sounds.  Good peripheral circulation. Respiratory: Normal respiratory effort.  No retractions. Lungs CTAB. Gastrointestinal: Soft and nontender. No distention. No abdominal bruits. No CVA tenderness. Genitourinary:  Musculoskeletal: No lower extremity tenderness nor edema.  No joint effusions. Neurologic:  Normal speech and language. No gross focal neurologic deficits are appreciated. No facial droop Skin:  Skin is warm, dry and intact. No rash noted. Psychiatric: Mood and affect are normal. Speech and behavior are normal.  ____________________________________________   LABS (all labs ordered are listed, but only abnormal results are displayed)  No results found for this or any previous visit (from the past 24 hour(s)). ____________________________________________ ____________________________________________  [id number]  I personally reviewed all radiographic images ordered to evaluate for the above acute complaints and reviewed radiology reports and findings.  These findings were personally discussed with the patient.  Please see medical record for radiology report.  ____________________________________________   PROCEDURES  Procedure(s) performed:  Procedures    Critical Care performed: no ____________________________________________   INITIAL IMPRESSION / ASSESSMENT AND PLAN / ED COURSE  Pertinent labs & imaging results that were available during my care of the patient were reviewed by me and considered in my medical decision making (see chart for details).   DDX: Increased IOP, bleed, migraine, tension  KONNAR BEN is a 58 y.o. who presents to the ED with symptoms as described above.  CT imaging will be ordered given his history.  Will perform tonometry of the eyes.  Will check visual acuity.  Clinical Course as of Aug 10 2224  Mon Aug 11, 2018  2149 Right  eye pressure is 40 on 6 several exams.  Left eye is 22 which is more of his baseline.  Will consult with failure   [PR]  2158 I discussed the case in consultation with Dr. Edison Pace of ophthalmology.  Agrees that IOP of 40 is too high.  Recommending initiate additional ophthalmologic treatment with brimonidine.  Discussed signs and symptoms for which the patient should return to the ER.   [PR]    Clinical Course User Index [PR] Merlyn Lot, MD     As part of my medical decision making, I reviewed the following data within the [place]  Nursing notes reviewed and incorporated, Labs reviewed, notes from prior ED visits and [place] Controlled Substance Database   ____________________________________________   FINAL CLINICAL IMPRESSION(S) / ED DIAGNOSES  Final diagnoses:  Bad headache  Elevated IOP, right      NEW MEDICATIONS STARTED DURING THIS VISIT:  New Prescriptions   No medications on file     Note:  This document was prepared using Dragon voice recognition software and may include unintentional dictation errors.    Merlyn Lot, MD 08/11/18 2226

## 2018-08-11 NOTE — Discharge Instructions (Signed)

## 2018-08-12 DIAGNOSIS — H401113 Primary open-angle glaucoma, right eye, severe stage: Secondary | ICD-10-CM | POA: Diagnosis not present

## 2018-08-12 DIAGNOSIS — H16223 Keratoconjunctivitis sicca, not specified as Sjogren's, bilateral: Secondary | ICD-10-CM | POA: Diagnosis not present

## 2018-08-12 DIAGNOSIS — H1045 Other chronic allergic conjunctivitis: Secondary | ICD-10-CM | POA: Diagnosis not present

## 2018-08-14 DIAGNOSIS — I2699 Other pulmonary embolism without acute cor pulmonale: Secondary | ICD-10-CM | POA: Diagnosis not present

## 2018-08-14 DIAGNOSIS — Z9481 Bone marrow transplant status: Secondary | ICD-10-CM | POA: Diagnosis not present

## 2018-08-14 DIAGNOSIS — R739 Hyperglycemia, unspecified: Secondary | ICD-10-CM | POA: Diagnosis not present

## 2018-08-14 DIAGNOSIS — D509 Iron deficiency anemia, unspecified: Secondary | ICD-10-CM | POA: Diagnosis not present

## 2018-08-14 DIAGNOSIS — M109 Gout, unspecified: Secondary | ICD-10-CM | POA: Diagnosis not present

## 2018-08-14 DIAGNOSIS — Z951 Presence of aortocoronary bypass graft: Secondary | ICD-10-CM | POA: Diagnosis not present

## 2018-08-14 DIAGNOSIS — I2510 Atherosclerotic heart disease of native coronary artery without angina pectoris: Secondary | ICD-10-CM | POA: Diagnosis not present

## 2018-08-14 DIAGNOSIS — Z79899 Other long term (current) drug therapy: Secondary | ICD-10-CM | POA: Diagnosis not present

## 2018-08-14 DIAGNOSIS — Z006 Encounter for examination for normal comparison and control in clinical research program: Secondary | ICD-10-CM | POA: Diagnosis not present

## 2018-08-14 DIAGNOSIS — T380X5A Adverse effect of glucocorticoids and synthetic analogues, initial encounter: Secondary | ICD-10-CM | POA: Diagnosis not present

## 2018-08-14 DIAGNOSIS — Z86711 Personal history of pulmonary embolism: Secondary | ICD-10-CM | POA: Diagnosis not present

## 2018-08-14 DIAGNOSIS — D89811 Chronic graft-versus-host disease: Secondary | ICD-10-CM | POA: Diagnosis not present

## 2018-08-14 DIAGNOSIS — I82403 Acute embolism and thrombosis of unspecified deep veins of lower extremity, bilateral: Secondary | ICD-10-CM | POA: Diagnosis not present

## 2018-08-14 DIAGNOSIS — G629 Polyneuropathy, unspecified: Secondary | ICD-10-CM | POA: Diagnosis not present

## 2018-08-14 DIAGNOSIS — C9000 Multiple myeloma not having achieved remission: Secondary | ICD-10-CM | POA: Diagnosis not present

## 2018-09-12 DIAGNOSIS — H0102A Squamous blepharitis right eye, upper and lower eyelids: Secondary | ICD-10-CM | POA: Diagnosis not present

## 2018-09-12 DIAGNOSIS — H0102B Squamous blepharitis left eye, upper and lower eyelids: Secondary | ICD-10-CM | POA: Diagnosis not present

## 2018-09-12 DIAGNOSIS — H401130 Primary open-angle glaucoma, bilateral, stage unspecified: Secondary | ICD-10-CM | POA: Diagnosis not present

## 2018-09-12 DIAGNOSIS — H1045 Other chronic allergic conjunctivitis: Secondary | ICD-10-CM | POA: Diagnosis not present

## 2018-09-17 DIAGNOSIS — Z9481 Bone marrow transplant status: Secondary | ICD-10-CM | POA: Diagnosis not present

## 2018-09-17 DIAGNOSIS — D89811 Chronic graft-versus-host disease: Secondary | ICD-10-CM | POA: Diagnosis not present

## 2018-09-17 DIAGNOSIS — Z006 Encounter for examination for normal comparison and control in clinical research program: Secondary | ICD-10-CM | POA: Diagnosis not present

## 2018-09-17 DIAGNOSIS — C9001 Multiple myeloma in remission: Secondary | ICD-10-CM | POA: Diagnosis not present

## 2018-10-09 DIAGNOSIS — I083 Combined rheumatic disorders of mitral, aortic and tricuspid valves: Secondary | ICD-10-CM | POA: Diagnosis not present

## 2018-10-09 DIAGNOSIS — Z006 Encounter for examination for normal comparison and control in clinical research program: Secondary | ICD-10-CM | POA: Diagnosis not present

## 2018-10-09 DIAGNOSIS — Y834 Other reconstructive surgery as the cause of abnormal reaction of the patient, or of later complication, without mention of misadventure at the time of the procedure: Secondary | ICD-10-CM | POA: Diagnosis not present

## 2018-10-09 DIAGNOSIS — Z951 Presence of aortocoronary bypass graft: Secondary | ICD-10-CM | POA: Diagnosis not present

## 2018-10-09 DIAGNOSIS — T865 Complications of stem cell transplant: Secondary | ICD-10-CM | POA: Diagnosis not present

## 2018-10-09 DIAGNOSIS — Z79899 Other long term (current) drug therapy: Secondary | ICD-10-CM | POA: Diagnosis not present

## 2018-10-09 DIAGNOSIS — I2510 Atherosclerotic heart disease of native coronary artery without angina pectoris: Secondary | ICD-10-CM | POA: Diagnosis not present

## 2018-10-09 DIAGNOSIS — C9000 Multiple myeloma not having achieved remission: Secondary | ICD-10-CM | POA: Diagnosis not present

## 2018-10-09 DIAGNOSIS — Z23 Encounter for immunization: Secondary | ICD-10-CM | POA: Diagnosis not present

## 2018-10-09 DIAGNOSIS — D89811 Chronic graft-versus-host disease: Secondary | ICD-10-CM | POA: Diagnosis not present

## 2018-10-09 DIAGNOSIS — I059 Rheumatic mitral valve disease, unspecified: Secondary | ICD-10-CM | POA: Diagnosis not present

## 2018-10-09 DIAGNOSIS — Z9481 Bone marrow transplant status: Secondary | ICD-10-CM | POA: Diagnosis not present

## 2018-10-17 DIAGNOSIS — D699 Hemorrhagic condition, unspecified: Secondary | ICD-10-CM | POA: Diagnosis not present

## 2018-10-22 DIAGNOSIS — H0102A Squamous blepharitis right eye, upper and lower eyelids: Secondary | ICD-10-CM | POA: Diagnosis not present

## 2018-10-22 DIAGNOSIS — H1045 Other chronic allergic conjunctivitis: Secondary | ICD-10-CM | POA: Diagnosis not present

## 2018-10-22 DIAGNOSIS — H0102B Squamous blepharitis left eye, upper and lower eyelids: Secondary | ICD-10-CM | POA: Diagnosis not present

## 2018-10-22 DIAGNOSIS — H401130 Primary open-angle glaucoma, bilateral, stage unspecified: Secondary | ICD-10-CM | POA: Diagnosis not present

## 2018-10-23 DIAGNOSIS — H0102A Squamous blepharitis right eye, upper and lower eyelids: Secondary | ICD-10-CM | POA: Diagnosis not present

## 2018-10-23 DIAGNOSIS — H0102B Squamous blepharitis left eye, upper and lower eyelids: Secondary | ICD-10-CM | POA: Diagnosis not present

## 2018-10-23 DIAGNOSIS — H16223 Keratoconjunctivitis sicca, not specified as Sjogren's, bilateral: Secondary | ICD-10-CM | POA: Diagnosis not present

## 2018-10-23 DIAGNOSIS — H1045 Other chronic allergic conjunctivitis: Secondary | ICD-10-CM | POA: Diagnosis not present

## 2018-11-18 DIAGNOSIS — C9001 Multiple myeloma in remission: Secondary | ICD-10-CM | POA: Diagnosis not present

## 2018-11-18 DIAGNOSIS — D699 Hemorrhagic condition, unspecified: Secondary | ICD-10-CM | POA: Diagnosis not present

## 2018-11-18 DIAGNOSIS — Z9481 Bone marrow transplant status: Secondary | ICD-10-CM | POA: Diagnosis not present

## 2018-11-24 DIAGNOSIS — D89811 Chronic graft-versus-host disease: Secondary | ICD-10-CM | POA: Diagnosis not present

## 2018-11-24 DIAGNOSIS — I82412 Acute embolism and thrombosis of left femoral vein: Secondary | ICD-10-CM | POA: Diagnosis not present

## 2018-11-24 DIAGNOSIS — I2690 Septic pulmonary embolism without acute cor pulmonale: Secondary | ICD-10-CM | POA: Diagnosis not present

## 2018-11-24 DIAGNOSIS — I82403 Acute embolism and thrombosis of unspecified deep veins of lower extremity, bilateral: Secondary | ICD-10-CM | POA: Diagnosis not present

## 2018-11-24 DIAGNOSIS — C9000 Multiple myeloma not having achieved remission: Secondary | ICD-10-CM | POA: Diagnosis not present

## 2018-11-24 DIAGNOSIS — D699 Hemorrhagic condition, unspecified: Secondary | ICD-10-CM | POA: Diagnosis not present

## 2019-01-07 DIAGNOSIS — C9000 Multiple myeloma not having achieved remission: Secondary | ICD-10-CM | POA: Diagnosis not present

## 2019-01-07 DIAGNOSIS — Z79899 Other long term (current) drug therapy: Secondary | ICD-10-CM | POA: Diagnosis not present

## 2019-01-07 DIAGNOSIS — I2699 Other pulmonary embolism without acute cor pulmonale: Secondary | ICD-10-CM | POA: Diagnosis not present

## 2019-01-07 DIAGNOSIS — H409 Unspecified glaucoma: Secondary | ICD-10-CM | POA: Diagnosis not present

## 2019-01-07 DIAGNOSIS — R1310 Dysphagia, unspecified: Secondary | ICD-10-CM | POA: Diagnosis not present

## 2019-01-07 DIAGNOSIS — I82403 Acute embolism and thrombosis of unspecified deep veins of lower extremity, bilateral: Secondary | ICD-10-CM | POA: Diagnosis not present

## 2019-01-07 DIAGNOSIS — G629 Polyneuropathy, unspecified: Secondary | ICD-10-CM | POA: Diagnosis not present

## 2019-01-07 DIAGNOSIS — Z006 Encounter for examination for normal comparison and control in clinical research program: Secondary | ICD-10-CM | POA: Diagnosis not present

## 2019-01-07 DIAGNOSIS — H40051 Ocular hypertension, right eye: Secondary | ICD-10-CM | POA: Diagnosis not present

## 2019-01-07 DIAGNOSIS — D89811 Chronic graft-versus-host disease: Secondary | ICD-10-CM | POA: Diagnosis not present

## 2019-01-07 DIAGNOSIS — Z9484 Stem cells transplant status: Secondary | ICD-10-CM | POA: Diagnosis not present

## 2019-01-07 DIAGNOSIS — Z7901 Long term (current) use of anticoagulants: Secondary | ICD-10-CM | POA: Diagnosis not present

## 2019-01-19 DIAGNOSIS — D699 Hemorrhagic condition, unspecified: Secondary | ICD-10-CM | POA: Diagnosis not present

## 2019-02-13 ENCOUNTER — Other Ambulatory Visit: Payer: Self-pay

## 2019-02-13 ENCOUNTER — Encounter: Payer: Self-pay | Admitting: Family Medicine

## 2019-02-13 ENCOUNTER — Ambulatory Visit: Payer: BC Managed Care – PPO | Admitting: Family Medicine

## 2019-02-13 VITALS — BP 120/78 | HR 69 | Temp 96.5°F | Wt 151.8 lb

## 2019-02-13 DIAGNOSIS — D89813 Graft-versus-host disease, unspecified: Secondary | ICD-10-CM | POA: Diagnosis not present

## 2019-02-13 DIAGNOSIS — R1900 Intra-abdominal and pelvic swelling, mass and lump, unspecified site: Secondary | ICD-10-CM | POA: Diagnosis not present

## 2019-02-13 DIAGNOSIS — C9001 Multiple myeloma in remission: Secondary | ICD-10-CM

## 2019-02-13 DIAGNOSIS — L988 Other specified disorders of the skin and subcutaneous tissue: Secondary | ICD-10-CM

## 2019-02-13 DIAGNOSIS — Z23 Encounter for immunization: Secondary | ICD-10-CM | POA: Diagnosis not present

## 2019-02-13 NOTE — Progress Notes (Signed)
   Subjective:    Patient ID: [name], male    DOB: 1961/02/05, 58 y.o.   MRN: [id number]  HPI He is here for evaluation of possible abdominal wall hernia.  He has had no pain, nausea, vomiting.  He continues to go to [facility].  He is now on a new medication to help with graft-versus-host disease and says it seems to be working.   Review of Systems     Objective:   Physical Exam Alert and in no distress.  Exam of the abdominal wall does show central abdominal distention in a vertical pattern but no true hernia.       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  Showed him a picture of abdominal wall anatomy explained that this is actually the linea alba that has stretched. Flu shot also given.

## 2019-02-17 DIAGNOSIS — C9000 Multiple myeloma not having achieved remission: Secondary | ICD-10-CM | POA: Diagnosis not present

## 2019-02-17 DIAGNOSIS — Z006 Encounter for examination for normal comparison and control in clinical research program: Secondary | ICD-10-CM | POA: Diagnosis not present

## 2019-02-18 DIAGNOSIS — Z9481 Bone marrow transplant status: Secondary | ICD-10-CM | POA: Diagnosis not present

## 2019-02-18 DIAGNOSIS — J90 Pleural effusion, not elsewhere classified: Secondary | ICD-10-CM | POA: Diagnosis not present

## 2019-02-18 DIAGNOSIS — C9000 Multiple myeloma not having achieved remission: Secondary | ICD-10-CM | POA: Diagnosis not present

## 2019-02-18 DIAGNOSIS — R0602 Shortness of breath: Secondary | ICD-10-CM | POA: Diagnosis not present

## 2019-02-18 DIAGNOSIS — I517 Cardiomegaly: Secondary | ICD-10-CM | POA: Diagnosis not present

## 2019-02-18 DIAGNOSIS — Z86711 Personal history of pulmonary embolism: Secondary | ICD-10-CM | POA: Diagnosis not present

## 2019-02-18 DIAGNOSIS — J811 Chronic pulmonary edema: Secondary | ICD-10-CM | POA: Diagnosis not present

## 2019-02-19 DIAGNOSIS — I2510 Atherosclerotic heart disease of native coronary artery without angina pectoris: Secondary | ICD-10-CM | POA: Diagnosis not present

## 2019-02-19 DIAGNOSIS — D899 Disorder involving the immune mechanism, unspecified: Secondary | ICD-10-CM | POA: Diagnosis not present

## 2019-02-19 DIAGNOSIS — I059 Rheumatic mitral valve disease, unspecified: Secondary | ICD-10-CM | POA: Diagnosis not present

## 2019-02-23 DIAGNOSIS — H0102A Squamous blepharitis right eye, upper and lower eyelids: Secondary | ICD-10-CM | POA: Diagnosis not present

## 2019-02-23 DIAGNOSIS — H1045 Other chronic allergic conjunctivitis: Secondary | ICD-10-CM | POA: Diagnosis not present

## 2019-02-23 DIAGNOSIS — H401110 Primary open-angle glaucoma, right eye, stage unspecified: Secondary | ICD-10-CM | POA: Diagnosis not present

## 2019-02-23 DIAGNOSIS — H0102B Squamous blepharitis left eye, upper and lower eyelids: Secondary | ICD-10-CM | POA: Diagnosis not present

## 2019-02-24 ENCOUNTER — Encounter: Payer: Self-pay | Admitting: Family Medicine

## 2019-03-25 DIAGNOSIS — H1045 Other chronic allergic conjunctivitis: Secondary | ICD-10-CM | POA: Diagnosis not present

## 2019-03-25 DIAGNOSIS — H0102B Squamous blepharitis left eye, upper and lower eyelids: Secondary | ICD-10-CM | POA: Diagnosis not present

## 2019-03-25 DIAGNOSIS — H401110 Primary open-angle glaucoma, right eye, stage unspecified: Secondary | ICD-10-CM | POA: Diagnosis not present

## 2019-03-25 DIAGNOSIS — H0102A Squamous blepharitis right eye, upper and lower eyelids: Secondary | ICD-10-CM | POA: Diagnosis not present

## 2019-04-15 DIAGNOSIS — Z9481 Bone marrow transplant status: Secondary | ICD-10-CM | POA: Diagnosis not present

## 2019-04-15 DIAGNOSIS — R05 Cough: Secondary | ICD-10-CM | POA: Diagnosis not present

## 2019-04-15 DIAGNOSIS — Z006 Encounter for examination for normal comparison and control in clinical research program: Secondary | ICD-10-CM | POA: Diagnosis not present

## 2019-04-15 DIAGNOSIS — Y834 Other reconstructive surgery as the cause of abnormal reaction of the patient, or of later complication, without mention of misadventure at the time of the procedure: Secondary | ICD-10-CM | POA: Diagnosis not present

## 2019-04-15 DIAGNOSIS — C9000 Multiple myeloma not having achieved remission: Secondary | ICD-10-CM | POA: Diagnosis not present

## 2019-04-15 DIAGNOSIS — T865 Complications of stem cell transplant: Secondary | ICD-10-CM | POA: Diagnosis not present

## 2019-04-15 DIAGNOSIS — D509 Iron deficiency anemia, unspecified: Secondary | ICD-10-CM | POA: Diagnosis not present

## 2019-04-15 DIAGNOSIS — R0602 Shortness of breath: Secondary | ICD-10-CM | POA: Diagnosis not present

## 2019-04-15 DIAGNOSIS — Z79899 Other long term (current) drug therapy: Secondary | ICD-10-CM | POA: Diagnosis not present

## 2019-04-15 DIAGNOSIS — D89811 Chronic graft-versus-host disease: Secondary | ICD-10-CM | POA: Diagnosis not present

## 2019-04-15 DIAGNOSIS — Z5181 Encounter for therapeutic drug level monitoring: Secondary | ICD-10-CM | POA: Diagnosis not present

## 2019-04-15 DIAGNOSIS — Z86718 Personal history of other venous thrombosis and embolism: Secondary | ICD-10-CM | POA: Diagnosis not present

## 2019-04-15 DIAGNOSIS — Z86711 Personal history of pulmonary embolism: Secondary | ICD-10-CM | POA: Diagnosis not present

## 2019-04-15 DIAGNOSIS — G629 Polyneuropathy, unspecified: Secondary | ICD-10-CM | POA: Diagnosis not present

## 2019-04-15 DIAGNOSIS — R21 Rash and other nonspecific skin eruption: Secondary | ICD-10-CM | POA: Diagnosis not present

## 2019-04-15 DIAGNOSIS — Z7901 Long term (current) use of anticoagulants: Secondary | ICD-10-CM | POA: Diagnosis not present

## 2019-04-20 DIAGNOSIS — R7989 Other specified abnormal findings of blood chemistry: Secondary | ICD-10-CM | POA: Diagnosis not present

## 2019-04-20 DIAGNOSIS — R748 Abnormal levels of other serum enzymes: Secondary | ICD-10-CM | POA: Diagnosis not present

## 2019-04-20 DIAGNOSIS — C9000 Multiple myeloma not having achieved remission: Secondary | ICD-10-CM | POA: Diagnosis not present

## 2019-04-20 DIAGNOSIS — D89811 Chronic graft-versus-host disease: Secondary | ICD-10-CM | POA: Diagnosis not present

## 2019-04-20 DIAGNOSIS — Z9481 Bone marrow transplant status: Secondary | ICD-10-CM | POA: Diagnosis not present

## 2019-04-21 DIAGNOSIS — I517 Cardiomegaly: Secondary | ICD-10-CM | POA: Diagnosis not present

## 2019-04-21 DIAGNOSIS — Z9481 Bone marrow transplant status: Secondary | ICD-10-CM | POA: Diagnosis not present

## 2019-04-21 DIAGNOSIS — R0689 Other abnormalities of breathing: Secondary | ICD-10-CM | POA: Diagnosis not present

## 2019-04-21 DIAGNOSIS — Z951 Presence of aortocoronary bypass graft: Secondary | ICD-10-CM | POA: Diagnosis not present

## 2019-04-21 DIAGNOSIS — Z23 Encounter for immunization: Secondary | ICD-10-CM | POA: Diagnosis not present

## 2019-04-21 DIAGNOSIS — Z9889 Other specified postprocedural states: Secondary | ICD-10-CM | POA: Diagnosis not present

## 2019-04-21 DIAGNOSIS — Z8579 Personal history of other malignant neoplasms of lymphoid, hematopoietic and related tissues: Secondary | ICD-10-CM | POA: Diagnosis not present

## 2019-04-21 DIAGNOSIS — R0600 Dyspnea, unspecified: Secondary | ICD-10-CM | POA: Diagnosis not present

## 2019-04-22 DIAGNOSIS — R0689 Other abnormalities of breathing: Secondary | ICD-10-CM | POA: Diagnosis not present

## 2019-04-22 DIAGNOSIS — R0600 Dyspnea, unspecified: Secondary | ICD-10-CM | POA: Diagnosis not present

## 2019-05-06 DIAGNOSIS — R918 Other nonspecific abnormal finding of lung field: Secondary | ICD-10-CM | POA: Diagnosis not present

## 2019-05-06 DIAGNOSIS — R911 Solitary pulmonary nodule: Secondary | ICD-10-CM | POA: Diagnosis not present

## 2019-05-06 DIAGNOSIS — Z951 Presence of aortocoronary bypass graft: Secondary | ICD-10-CM | POA: Diagnosis not present

## 2019-05-06 DIAGNOSIS — D89813 Graft-versus-host disease, unspecified: Secondary | ICD-10-CM | POA: Diagnosis not present

## 2019-05-06 DIAGNOSIS — R0600 Dyspnea, unspecified: Secondary | ICD-10-CM | POA: Diagnosis not present

## 2019-05-06 DIAGNOSIS — I517 Cardiomegaly: Secondary | ICD-10-CM | POA: Diagnosis not present

## 2019-05-06 DIAGNOSIS — J849 Interstitial pulmonary disease, unspecified: Secondary | ICD-10-CM | POA: Diagnosis not present

## 2019-05-13 DIAGNOSIS — Z9481 Bone marrow transplant status: Secondary | ICD-10-CM | POA: Diagnosis not present

## 2019-05-13 DIAGNOSIS — C9000 Multiple myeloma not having achieved remission: Secondary | ICD-10-CM | POA: Diagnosis not present

## 2019-06-25 DIAGNOSIS — I2510 Atherosclerotic heart disease of native coronary artery without angina pectoris: Secondary | ICD-10-CM | POA: Diagnosis not present

## 2019-06-25 DIAGNOSIS — D89811 Chronic graft-versus-host disease: Secondary | ICD-10-CM | POA: Diagnosis not present

## 2019-06-25 DIAGNOSIS — K117 Disturbances of salivary secretion: Secondary | ICD-10-CM | POA: Diagnosis not present

## 2019-06-25 DIAGNOSIS — K056 Periodontal disease, unspecified: Secondary | ICD-10-CM | POA: Diagnosis not present

## 2019-07-15 DIAGNOSIS — H04129 Dry eye syndrome of unspecified lacrimal gland: Secondary | ICD-10-CM | POA: Diagnosis not present

## 2019-07-15 DIAGNOSIS — C9000 Multiple myeloma not having achieved remission: Secondary | ICD-10-CM | POA: Diagnosis not present

## 2019-07-15 DIAGNOSIS — R0602 Shortness of breath: Secondary | ICD-10-CM | POA: Diagnosis not present

## 2019-07-15 DIAGNOSIS — K121 Other forms of stomatitis: Secondary | ICD-10-CM | POA: Diagnosis not present

## 2019-07-15 DIAGNOSIS — G629 Polyneuropathy, unspecified: Secondary | ICD-10-CM | POA: Diagnosis not present

## 2019-07-15 DIAGNOSIS — Z79899 Other long term (current) drug therapy: Secondary | ICD-10-CM | POA: Diagnosis not present

## 2019-07-15 DIAGNOSIS — Z86718 Personal history of other venous thrombosis and embolism: Secondary | ICD-10-CM | POA: Diagnosis not present

## 2019-07-15 DIAGNOSIS — Z951 Presence of aortocoronary bypass graft: Secondary | ICD-10-CM | POA: Diagnosis not present

## 2019-07-15 DIAGNOSIS — Z5181 Encounter for therapeutic drug level monitoring: Secondary | ICD-10-CM | POA: Diagnosis not present

## 2019-07-15 DIAGNOSIS — C9001 Multiple myeloma in remission: Secondary | ICD-10-CM | POA: Diagnosis not present

## 2019-07-15 DIAGNOSIS — T865 Complications of stem cell transplant: Secondary | ICD-10-CM | POA: Diagnosis not present

## 2019-07-15 DIAGNOSIS — Z86711 Personal history of pulmonary embolism: Secondary | ICD-10-CM | POA: Diagnosis not present

## 2019-07-15 DIAGNOSIS — L989 Disorder of the skin and subcutaneous tissue, unspecified: Secondary | ICD-10-CM | POA: Diagnosis not present

## 2019-07-15 DIAGNOSIS — I2510 Atherosclerotic heart disease of native coronary artery without angina pectoris: Secondary | ICD-10-CM | POA: Diagnosis not present

## 2019-07-15 DIAGNOSIS — D89811 Chronic graft-versus-host disease: Secondary | ICD-10-CM | POA: Diagnosis not present

## 2019-07-15 DIAGNOSIS — Z7901 Long term (current) use of anticoagulants: Secondary | ICD-10-CM | POA: Diagnosis not present

## 2019-07-15 DIAGNOSIS — Z9481 Bone marrow transplant status: Secondary | ICD-10-CM | POA: Diagnosis not present

## 2019-07-15 DIAGNOSIS — D509 Iron deficiency anemia, unspecified: Secondary | ICD-10-CM | POA: Diagnosis not present

## 2019-07-20 DIAGNOSIS — Z7901 Long term (current) use of anticoagulants: Secondary | ICD-10-CM | POA: Diagnosis not present

## 2019-07-20 DIAGNOSIS — D89811 Chronic graft-versus-host disease: Secondary | ICD-10-CM | POA: Diagnosis not present

## 2019-07-20 DIAGNOSIS — Z86711 Personal history of pulmonary embolism: Secondary | ICD-10-CM | POA: Diagnosis not present

## 2019-07-20 DIAGNOSIS — Z86718 Personal history of other venous thrombosis and embolism: Secondary | ICD-10-CM | POA: Diagnosis not present

## 2019-07-20 DIAGNOSIS — Z5181 Encounter for therapeutic drug level monitoring: Secondary | ICD-10-CM | POA: Diagnosis not present

## 2019-07-20 DIAGNOSIS — I825Y2 Chronic embolism and thrombosis of unspecified deep veins of left proximal lower extremity: Secondary | ICD-10-CM | POA: Diagnosis not present

## 2019-07-20 DIAGNOSIS — Z8579 Personal history of other malignant neoplasms of lymphoid, hematopoietic and related tissues: Secondary | ICD-10-CM | POA: Diagnosis not present

## 2019-07-20 DIAGNOSIS — D699 Hemorrhagic condition, unspecified: Secondary | ICD-10-CM | POA: Diagnosis not present

## 2019-08-07 ENCOUNTER — Other Ambulatory Visit: Payer: Self-pay

## 2019-08-07 ENCOUNTER — Ambulatory Visit
Admission: RE | Admit: 2019-08-07 | Discharge: 2019-08-07 | Disposition: A | Discharge status: Home / Self Care | Payer: BC Managed Care – PPO | Source: Ambulatory Visit | Attending: Family Medicine | Admitting: Family Medicine

## 2019-08-07 ENCOUNTER — Ambulatory Visit (INDEPENDENT_AMBULATORY_CARE_PROVIDER_SITE_OTHER): Payer: BC Managed Care – PPO | Admitting: Family Medicine

## 2019-08-07 ENCOUNTER — Encounter: Payer: Self-pay | Admitting: Family Medicine

## 2019-08-07 VITALS — BP 110/64 | HR 52 | Temp 97.8°F | Wt 144.6 lb

## 2019-08-07 DIAGNOSIS — L03031 Cellulitis of right toe: Secondary | ICD-10-CM

## 2019-08-07 DIAGNOSIS — M79674 Pain in right toe(s): Secondary | ICD-10-CM

## 2019-08-07 DIAGNOSIS — M25476 Effusion, unspecified foot: Secondary | ICD-10-CM

## 2019-08-07 DIAGNOSIS — S90421A Blister (nonthermal), right great toe, initial encounter: Secondary | ICD-10-CM | POA: Diagnosis not present

## 2019-08-07 DIAGNOSIS — M7731 Calcaneal spur, right foot: Secondary | ICD-10-CM | POA: Diagnosis not present

## 2019-08-07 MED ORDER — AMOXICILLIN-POT CLAVULANATE 875-125 MG PO TABS: 1.0000 | ORAL_TABLET | Freq: Two times a day (BID) | ORAL | 0 refills | Status: DC

## 2019-08-07 NOTE — Progress Notes (Signed)
   Subjective:    Patient ID: [name], male    DOB: 1961/01/04, 59 y.o.   MRN: [id number]  HPI Chief Complaint  Patient presents with  . infected toe    right foot big toe infected toe- red and swollen noticed it yesteday.    Complains of right great toe pain and swelling x 2 months. States it became worse the past 2 days. Denies history of injury. He does have neuropathy. He also has multiple myeloma.   Noticed a blister on the medial aspect of his right great toe yesterday which he burst and states pus came out.  Denies pain unless pressing on it.   He does not have history of diabetes or MRSA.   Reports feeling fine. No fever, chills, N/V/D.      Review of Systems Pertinent positives and negatives in the history of present illness.     Objective:   Physical Exam Constitutional:      General: He is not in acute distress. Musculoskeletal:     Right foot: Normal capillary refill. Swelling and bony tenderness present. Normal pulse.     Comments: Right great toe with erythema, edema, ruptured blister on medial aspect of great toe, exudate expressed, TTP of MTP and PIP joints of great toe. Nail is not involved.   Skin:    General: Skin is warm and dry.     Capillary Refill: Capillary refill takes less than 2 seconds.  Neurological:     Mental Status: He is alert.    BP 110/64   Pulse (!) 52   Temp 97.8 F (36.6 C)   Wt 144 lb 9.6 oz (65.6 kg)   BMI 24.06 kg/m       Assessment & Plan:  Cellulitis of great toe of right foot - Plan: amoxicillin-clavulanate (AUGMENTIN) 875-125 MG tablet  Great toe pain, right - Plan: DG Foot Complete Right  Blister of great toe of right foot, initial encounter - Plan: DG Foot Complete Right  Swelling of first metatarsophalangeal (MTP) joint - Plan: DG Foot Complete Right  Immunodeficient patient here with great right toe infection and tenderness of first MTP joint. I will send him for a stat XR and start him on antibiotic  therapy. He will soak his foot 3-4 times per day. Has not needed pain medication. Follow up pending XR and if he is not improving. If he gets much worse over the weekend, he will go to the ED. Needs f/u here Monday but can only come in afternoon (has morning appt at [facility]).   Addendum- XR negative for acute abnormality.

## 2019-08-10 ENCOUNTER — Other Ambulatory Visit: Payer: Self-pay

## 2019-08-10 ENCOUNTER — Ambulatory Visit: Payer: BC Managed Care – PPO | Admitting: Family Medicine

## 2019-08-10 ENCOUNTER — Encounter: Payer: Self-pay | Admitting: Family Medicine

## 2019-08-10 VITALS — BP 106/70 | HR 36 | Temp 97.3°F | Wt 147.4 lb

## 2019-08-10 DIAGNOSIS — L03031 Cellulitis of right toe: Secondary | ICD-10-CM

## 2019-08-10 DIAGNOSIS — K117 Disturbances of salivary secretion: Secondary | ICD-10-CM | POA: Diagnosis not present

## 2019-08-10 DIAGNOSIS — D89811 Chronic graft-versus-host disease: Secondary | ICD-10-CM | POA: Diagnosis not present

## 2019-08-10 NOTE — Progress Notes (Signed)
   Subjective:    Patient ID: [name], male    DOB: 06-Mar-1961, 59 y.o.   MRN: [id number]  HPI He is here for a recheck on his cellulitis of his right great toe.  He states that it is doing much better than his first visit.   Review of Systems     Objective:   Physical Exam Exam of the right great toe shows erythema but no swelling or tenderness to palpation.      Assessment & Plan:  Cellulitis of great toe of right foot Will continue him on his present medication and recheck this in about 1 week.  He was comfortable with that

## 2019-08-17 ENCOUNTER — Encounter: Payer: Self-pay | Admitting: Family Medicine

## 2019-08-17 ENCOUNTER — Ambulatory Visit (INDEPENDENT_AMBULATORY_CARE_PROVIDER_SITE_OTHER): Payer: BC Managed Care – PPO | Admitting: Family Medicine

## 2019-08-17 ENCOUNTER — Other Ambulatory Visit: Payer: Self-pay

## 2019-08-17 VITALS — BP 100/68 | HR 44 | Temp 96.9°F | Wt 146.4 lb

## 2019-08-17 DIAGNOSIS — L03031 Cellulitis of right toe: Secondary | ICD-10-CM | POA: Diagnosis not present

## 2019-08-17 DIAGNOSIS — L989 Disorder of the skin and subcutaneous tissue, unspecified: Secondary | ICD-10-CM | POA: Diagnosis not present

## 2019-08-17 NOTE — Progress Notes (Signed)
   Subjective:    Patient ID: [name], male    DOB: 10-14-1960, 59 y.o.   MRN: [id number]  HPI He is here for recheck.  He is put he states is doing much better.  He also has lesions on his face that have been there for several years.  The one on the right does intermittently cause difficulty especially when he shaves over the area.   Review of Systems     Objective:   Physical Exam Alert and in no distress.  Exam of the cheeks does show a slightly pinkish waxy lesion approximately 1 cm in size.  The left cheek does show some slightly raised follicular type lesions. The great toe now is nontender, not red or swollen.       Assessment & Plan:  Cellulitis of great toe of right foot  Skin lesions - Plan: Ambulatory referral to Dermatology No further intervention needed for the toe.  Will refer to Derm for the lesions especially the right cheek that does appear to be a BCE.

## 2019-08-20 DIAGNOSIS — I2510 Atherosclerotic heart disease of native coronary artery without angina pectoris: Secondary | ICD-10-CM | POA: Diagnosis not present

## 2019-08-20 DIAGNOSIS — I340 Nonrheumatic mitral (valve) insufficiency: Secondary | ICD-10-CM | POA: Diagnosis not present

## 2019-08-24 DIAGNOSIS — I340 Nonrheumatic mitral (valve) insufficiency: Secondary | ICD-10-CM | POA: Diagnosis not present

## 2019-08-24 DIAGNOSIS — L570 Actinic keratosis: Secondary | ICD-10-CM | POA: Diagnosis not present

## 2019-08-26 DIAGNOSIS — I059 Rheumatic mitral valve disease, unspecified: Secondary | ICD-10-CM | POA: Diagnosis not present

## 2019-08-27 DIAGNOSIS — T865 Complications of stem cell transplant: Secondary | ICD-10-CM | POA: Diagnosis not present

## 2019-08-27 DIAGNOSIS — Z7952 Long term (current) use of systemic steroids: Secondary | ICD-10-CM | POA: Diagnosis not present

## 2019-08-27 DIAGNOSIS — Z86711 Personal history of pulmonary embolism: Secondary | ICD-10-CM | POA: Diagnosis not present

## 2019-08-27 DIAGNOSIS — I340 Nonrheumatic mitral (valve) insufficiency: Secondary | ICD-10-CM | POA: Diagnosis not present

## 2019-08-27 DIAGNOSIS — I080 Rheumatic disorders of both mitral and aortic valves: Secondary | ICD-10-CM | POA: Diagnosis not present

## 2019-08-27 DIAGNOSIS — R0602 Shortness of breath: Secondary | ICD-10-CM | POA: Diagnosis not present

## 2019-08-27 DIAGNOSIS — I059 Rheumatic mitral valve disease, unspecified: Secondary | ICD-10-CM | POA: Diagnosis not present

## 2019-08-27 DIAGNOSIS — C9000 Multiple myeloma not having achieved remission: Secondary | ICD-10-CM | POA: Diagnosis not present

## 2019-08-27 DIAGNOSIS — D696 Thrombocytopenia, unspecified: Secondary | ICD-10-CM | POA: Diagnosis not present

## 2019-08-27 DIAGNOSIS — D89811 Chronic graft-versus-host disease: Secondary | ICD-10-CM | POA: Diagnosis not present

## 2019-08-27 DIAGNOSIS — J984 Other disorders of lung: Secondary | ICD-10-CM | POA: Diagnosis not present

## 2019-08-27 DIAGNOSIS — I5032 Chronic diastolic (congestive) heart failure: Secondary | ICD-10-CM | POA: Diagnosis not present

## 2019-08-27 DIAGNOSIS — Z7901 Long term (current) use of anticoagulants: Secondary | ICD-10-CM | POA: Diagnosis not present

## 2019-08-27 DIAGNOSIS — Z86718 Personal history of other venous thrombosis and embolism: Secondary | ICD-10-CM | POA: Diagnosis not present

## 2019-08-27 DIAGNOSIS — Z951 Presence of aortocoronary bypass graft: Secondary | ICD-10-CM | POA: Diagnosis not present

## 2019-08-27 DIAGNOSIS — D84821 Immunodeficiency due to drugs: Secondary | ICD-10-CM | POA: Diagnosis not present

## 2019-08-27 DIAGNOSIS — I371 Nonrheumatic pulmonary valve insufficiency: Secondary | ICD-10-CM | POA: Diagnosis not present

## 2019-08-27 DIAGNOSIS — Y834 Other reconstructive surgery as the cause of abnormal reaction of the patient, or of later complication, without mention of misadventure at the time of the procedure: Secondary | ICD-10-CM | POA: Diagnosis not present

## 2019-08-31 ENCOUNTER — Ambulatory Visit (INDEPENDENT_AMBULATORY_CARE_PROVIDER_SITE_OTHER): Payer: BC Managed Care – PPO | Admitting: Family Medicine

## 2019-08-31 ENCOUNTER — Other Ambulatory Visit: Payer: Self-pay

## 2019-08-31 ENCOUNTER — Encounter: Payer: Self-pay | Admitting: Family Medicine

## 2019-08-31 VITALS — Temp 98.2°F | Wt 146.0 lb

## 2019-08-31 DIAGNOSIS — I340 Nonrheumatic mitral (valve) insufficiency: Secondary | ICD-10-CM | POA: Diagnosis not present

## 2019-08-31 DIAGNOSIS — L03031 Cellulitis of right toe: Secondary | ICD-10-CM

## 2019-08-31 DIAGNOSIS — C9001 Multiple myeloma in remission: Secondary | ICD-10-CM | POA: Diagnosis not present

## 2019-08-31 DIAGNOSIS — J0100 Acute maxillary sinusitis, unspecified: Secondary | ICD-10-CM

## 2019-08-31 NOTE — Progress Notes (Signed)
   Subjective:    Patient ID: [name], male    DOB: [birth year], 59 y.o.   MRN: [id number]  HPI Documentation for virtual telephone encounter. Documentation for virtual audio and video telecommunications through WebEx encounter: The patient was located at home. The provider was located in the office. The patient did consent to this visit and is aware of possible charges through their insurance for this visit. The other persons participating in this telemedicine service were none. Time spent on call was 3 minutes and in review of previous records >8 minutes total. This virtual service is not related to other E/M service within previous 7 days. He states that last Friday he noted an itching sensation in the right nostril and some discomfort in the right maxillary sinus area with some postnasal drainage and rhinorrhea.  1 day after that the congestion got worse and he noted purulent nasal discharge.  No fever, chills, cough, congestion, sore throat or earache.  He notes that in the past this is using the start of an infection. He is followed at [facility] for multiple myeloma in remission.  He is also being scheduled for mitral valve surgery for mitral regurg.   Review of Systems     Objective:   Physical Exam Alert and in no distress otherwise not examined       Assessment & Plan:  Acute maxillary sinusitis, recurrence not specified  Moderate mitral regurgitation by prior echocardiogram  Multiple myeloma in remission ([place]) I think under the circumstances the safest thing will be to place him on Amoxil which he states worked in the past.  He will call if he continues have difficulty

## 2019-09-02 ENCOUNTER — Telehealth: Payer: Self-pay | Admitting: Family Medicine

## 2019-09-02 DIAGNOSIS — K224 Dyskinesia of esophagus: Secondary | ICD-10-CM | POA: Diagnosis not present

## 2019-09-02 DIAGNOSIS — K219 Gastro-esophageal reflux disease without esophagitis: Secondary | ICD-10-CM | POA: Diagnosis not present

## 2019-09-02 DIAGNOSIS — I059 Rheumatic mitral valve disease, unspecified: Secondary | ICD-10-CM | POA: Diagnosis not present

## 2019-09-02 MED ORDER — AMOXICILLIN-POT CLAVULANATE 875-125 MG PO TABS: 1.0000 | ORAL_TABLET | Freq: Two times a day (BID) | ORAL | 0 refills | Status: DC

## 2019-09-02 NOTE — Addendum Note (Signed)
Addended by: Denita Lung on: 09/02/2019 04:36 PM   Modules accepted: Orders

## 2019-09-02 NOTE — Telephone Encounter (Signed)
Pt's wife called, She is on pt's HIPAA. She states that pt was to have a RX for a antibiotic. Pharmacy states nothing was sent in. I do not see it either. Pt uses [facility] [address]

## 2019-09-02 NOTE — Telephone Encounter (Signed)
Pt never receievd abx from appointment 08-31-19. [facility] [address]. Thanks [employer]

## 2019-09-04 ENCOUNTER — Ambulatory Visit
Admission: EM | Admit: 2019-09-04 | Discharge: 2019-09-04 | Disposition: A | Discharge status: Home / Self Care | Payer: BC Managed Care – PPO

## 2019-09-04 DIAGNOSIS — W19XXXA Unspecified fall, initial encounter: Secondary | ICD-10-CM | POA: Diagnosis not present

## 2019-09-04 DIAGNOSIS — S0091XA Abrasion of unspecified part of head, initial encounter: Secondary | ICD-10-CM | POA: Diagnosis not present

## 2019-09-04 NOTE — ED Provider Notes (Signed)
[facility]    CSN: [id number] Arrival date & time: 09/04/19  0955      History   Chief Complaint Chief Complaint  Patient presents with  . Fall    HPI [name] is a 59 y.o. male with history of severe MVP, hypertension presenting for evaluation of wound on back of head.  States he was try to kick a basketball Wednesday evening and fell: Hit his bottom, then his head.  Denies LOC, headaches, change in vision.  No chest pain or difficulty breathing.  Has been keeping wound clean, covered with Band-Aid and using Neosporin.  Denying pain, discharge, fever.    Past Medical History:  Diagnosis Date  . Anxiety   . Aortic valve insufficiency, senile calcific October 14   Moderate regurgitation, eccentric towards Ant MV Leaflet  . Bence Jones proteinuria 09/01/2012   Borderline increase protein 190 mg on 24 hr urine IFE with free kappa light chains (lab normal 50-100 mg)  08/18/12  . CAD in native artery    s/p CABG; -- Cards: Dr. Roni Bread, [facility]; [facility] 01/2013 EF 57%, No ischemia or Infarction.  . Erectile dysfunction   . Essential hypertension   . H/O cardiovascular stress test 02/22/10   normal perfusion, no ischemia or infarct; treadmill and Myoview perfusion study; Dr. Elisabeth Cara  . Hyperlipidemia LDL goal <70   . Leukopenia 09/01/2012   WBC 3,700 42 poly, 48 lymphs, 9 monos 08/18/12  Was 3,700 1 year ago  . 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 Moderate mitral regurgitation by prior echocardiogram October 2014   Echo 02/2013: Severe holosystolic Ant MV Leaflet prolapse, Moderate regurgitation.  Mildly dilated LV with normal systolic function (EF 40-98%), moderate LA dilation, upper normal PA pressures ~ 34 mmHg.  . Monoclonal gammopathy 09/01/2012   Increased total protein, increased beta globulin peak with decreased gammaglobulin peak on SPEP 08/18/12  IFE not done "possible faint abnormal band"  . Multiple myeloma,  without mention of having achieved remission 10/14/2012   Stage I [place]; good prognosis international scoring system: see 10/13/12 progress note --> ? in Remission as of 06/19/2013; ? Failed Auto BMT; Continues to be on Chemo  with planned Donor BMT  . Normochromic normocytic anemia 09/01/2012   Hb 12.7, MCV 95  08/18/12 was 13.7 1 year ago; concomitant leukopenia  & increased lymphs  . Pneumococcal vaccine refused 08/15/2012    Patient Active Problem List   Diagnosis Date Noted  . 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 Dyslipidemia, goal LDL below 70 04/03/2014  . Aortic valve insufficiency, senile calcific   . Multiple myeloma in remission ([place]) 06/19/2013  . Moderate mitral regurgitation by prior echocardiogram 02/25/2013  . Mitral valve anterior leaflet prolapse 02/25/2013  . Hx of valvular heart disease - MVP with midl-mod MR, Mild AI with Aortic Sclerosis 02/16/2013  . CAD in native artery -- s/p CABG 02/16/2013  . Monoclonal gammopathy 09/01/2012  . Normochromic normocytic anemia 09/01/2012  . Leukopenia 09/01/2012  . Hx of CABG 08/02/2011  . Hyperlipidemia LDL goal <70 08/02/2011  . Allergic rhinitis, mild 08/02/2011  . Arthralgia of hand 10/11/2010  . Back pain 10/11/2010  . Fatigue 10/11/2010  . ED (erectile dysfunction) 10/11/2010    Past Surgical History:  Procedure Laterality Date  . COLONOSCOPY     ?  . CORONARY ARTERY BYPASS GRAFT  10/2003   LIMA to LAD, RIMA to OM1,  sequential to OM2; Dr. Elisabeth Cara       Home Medications    Prior to Admission medications   Medication Sig Start Date End Date Taking? Authorizing Provider  acyclovir (ZOVIRAX) 400 MG tablet TAKE ONE TABLET BY MOUTH TWICE DAILY    Granfortuna, Alyson Locket, MD  albuterol (PROVENTIL HFA;VENTOLIN HFA) 108 (90 BASE) MCG/ACT inhaler Inhale 2 puffs into the lungs every 4 (four) hours as needed for wheezing or shortness of breath (or cough). Patient not taking: Reported on 08/17/2019 03/14/15    Tysinger, Camelia Eng, PA-C  allopurinol (ZYLOPRIM) 100 MG tablet Take 100 mg by mouth daily.    [provider]  amLODipine (NORVASC) 5 MG tablet Take 5 mg by mouth daily.    [provider]  amoxicillin-clavulanate (AUGMENTIN) 875-125 MG tablet Take 1 tablet by mouth 2 (two) times daily. 09/02/19   Denita Lung, MD  Apixaban (ELIQUIS PO) Take 1 tablet by mouth 2 (two) times daily.    [provider]  aspirin 81 MG tablet Take 81 mg by mouth daily.      [provider]  azelastine (ASTELIN) 0.1 % nasal spray Place 2 sprays into both nostrils 2 (two) times daily. Use in each nostril as directed Patient not taking: Reported on 08/10/2019 01/03/16   Denita Lung, MD  cholecalciferol (VITAMIN D) 1000 units tablet Take 1,000 Units by mouth once a week.    [provider]  cycloSPORINE (RESTASIS) 0.05 % ophthalmic emulsion Place 1 drop into both eyes 2 (two) times daily.    [provider]  docusate sodium (COLACE) 100 MG capsule Take 200 mg by mouth 2 (two) times daily.    [provider]  dorzolamide-timolol (COSOPT) 22.3-6.8 MG/ML ophthalmic solution 1 drop 2 (two) times daily.    [provider]  DULoxetine (CYMBALTA) 30 MG capsule Take 30 mg by mouth daily.    [provider]  ezetimibe-simvastatin (VYTORIN) 10-20 MG tablet Take 1 tablet by mouth daily.    [provider]  fluconazole (DIFLUCAN) 200 MG tablet TAKE 2 TABLETS (400 MG TOTAL) BY MOUTH ONCE DAILY FOR 30 DAYS 02/01/19   [provider]  lisinopril (PRINIVIL,ZESTRIL) 2.5 MG tablet Take 2.5 mg by mouth daily.    [provider]  Loratadine (CLARITIN PO) Take by mouth daily.    [provider]  mometasone (NASONEX) 50 MCG/ACT nasal spray Place 2 sprays into the nose as needed. 09/25/12   Denita Lung, MD  omeprazole (PRILOSEC) 40 MG capsule Take by mouth daily. 11/10/18   [provider]  ondansetron (ZOFRAN-ODT) 8 MG  disintegrating tablet DISSOLVE 1 TABLET IN MOUTH EVERY 8 HOURS AS NEEDED FOR NAUSEA FOR UP TO 3 DAYS 11/02/18   [provider]  oxyCODONE (OXY IR/ROXICODONE) 5 MG immediate release tablet Take by mouth. 03/26/18   [provider]  pregabalin (LYRICA) 100 MG capsule Take 100 mg by mouth 2 (two) times daily.    [provider]  sildenafil (REVATIO) 20 MG tablet Take 1 to 5 pills as needed 11/29/16   Denita Lung, MD  tacrolimus (PROGRAF) 5 MG capsule Take 5 mg by mouth 2 (two) times daily.    [provider]  Tafluprost, PF, (ZIOPTAN) 0.0015 % SOLN Apply to eye.    [provider]  tobramycin (TOBREX) 0.3 % ophthalmic solution Place 1 drop into both eyes 3 (three) times daily.    [provider]  triamcinolone cream (KENALOG) 0.1 % Apply  1 application topically 2 (two) times daily. Patient not taking: Reported on 02/13/2019 11/29/16   Denita Lung, MD  Vitamin D, Ergocalciferol, (DRISDOL) 1.25 MG (50000 UT) CAPS capsule Take by mouth. 11/06/18   [provider]    Family History Family History  Problem Relation Age of Onset  . Heart disease Father   . Hypertension Father   . Heart failure Father   . Heart attack Brother        at 14  . Heart attack Brother        at 67    Social History Social History   Tobacco Use  . Smoking status: Never Smoker  . Smokeless tobacco: Never Used  Substance Use Topics  . Alcohol use: No  . Drug use: No     Allergies   Atorvastatin, Chlorhexidine gluconate, and Toprol xl [metoprolol tartrate]   Review of Systems As per HPI   Physical Exam Triage Vital Signs ED Triage Vitals  Enc Vitals Group     BP      Pulse      Resp      Temp      Temp src      SpO2      Weight      Height      Head Circumference      Peak Flow      Pain Score      Pain Loc      Pain Edu?      Excl. in [place]?    No data found.  Updated Vital Signs BP 124/78 (BP Location: Left Arm)   Pulse (!)  42   Temp 97.6 F (36.4 C) (Oral)   Resp 16   SpO2 97%   Visual Acuity Right Eye Distance:   Left Eye Distance:   Bilateral Distance:    Right Eye Near:   Left Eye Near:    Bilateral Near:     Physical Exam Constitutional:      General: He is not in acute distress. HENT:     Head: Normocephalic and atraumatic.  Eyes:     General: No scleral icterus.    Pupils: Pupils are equal, round, and reactive to light.  Cardiovascular:     Rate and Rhythm: Normal rate.  Pulmonary:     Effort: Pulmonary effort is normal. No respiratory distress.     Breath sounds: No wheezing.  Skin:    Coloration: Skin is not jaundiced or pale.     Comments: 1.5 cm circumferential abrasion to back of head.  Minimal edema.  No ecchymosis, erythema, warmth, opening or discharge.  Mild TTP.  Neurological:     Mental Status: He is alert and oriented to person, place, and time.      UC Treatments / Results  Labs (all labs ordered are listed, but only abnormal results are displayed) Labs Reviewed - No data to display  EKG   Radiology No results found.  Procedures Procedures (including critical care time)  Medications Ordered in UC Medications - No data to display  Initial Impression / Assessment and Plan / UC Course  I have reviewed the triage vital signs and the nursing notes.  Pertinent labs & imaging results that were available during my care of the patient were reviewed by me and considered in my medical decision making (see chart for details).     Patient appears well in office today.  No laceration noted, and abrasion is healing well.  Patient has been applying ice with relief of swelling.  Encouraged continuation of supportive treatment.    Return precautions discussed, patient verbalized understanding and is agreeable to plan. Final Clinical Impressions(s) / UC Diagnoses   Final diagnoses:  Fall, initial encounter  Abrasion of head, initial encounter     Discharge  Instructions     Keep area clean and dry. May cover before bedtime. Return for worsening pain, swelling, bleeding, discharge, fever.    ED Prescriptions    None     PDMP not reviewed this encounter.   Hall[place], [place], [place] 09/04/19 1034

## 2019-09-04 NOTE — ED Triage Notes (Signed)
Pt states Wednesday evening he fell trying to kick a basketball and fell back on his but then hit the back of his head on the concrete. Abrasion noted to back of head. Denies LOC.

## 2019-09-04 NOTE — Discharge Instructions (Addendum)
Keep area clean and dry. May cover before bedtime. Return for worsening pain, swelling, bleeding, discharge, fever.

## 2019-09-07 DIAGNOSIS — I341 Nonrheumatic mitral (valve) prolapse: Secondary | ICD-10-CM | POA: Diagnosis not present

## 2019-09-07 DIAGNOSIS — M109 Gout, unspecified: Secondary | ICD-10-CM | POA: Diagnosis not present

## 2019-09-07 DIAGNOSIS — G629 Polyneuropathy, unspecified: Secondary | ICD-10-CM | POA: Diagnosis not present

## 2019-09-07 DIAGNOSIS — Z888 Allergy status to other drugs, medicaments and biological substances status: Secondary | ICD-10-CM | POA: Diagnosis not present

## 2019-09-07 DIAGNOSIS — Z006 Encounter for examination for normal comparison and control in clinical research program: Secondary | ICD-10-CM | POA: Diagnosis not present

## 2019-09-07 DIAGNOSIS — Z7951 Long term (current) use of inhaled steroids: Secondary | ICD-10-CM | POA: Diagnosis not present

## 2019-09-07 DIAGNOSIS — Z79899 Other long term (current) drug therapy: Secondary | ICD-10-CM | POA: Diagnosis not present

## 2019-09-07 DIAGNOSIS — I340 Nonrheumatic mitral (valve) insufficiency: Secondary | ICD-10-CM | POA: Diagnosis not present

## 2019-09-07 DIAGNOSIS — I10 Essential (primary) hypertension: Secondary | ICD-10-CM | POA: Diagnosis not present

## 2019-09-07 DIAGNOSIS — Z20822 Contact with and (suspected) exposure to covid-19: Secondary | ICD-10-CM | POA: Diagnosis not present

## 2019-09-07 DIAGNOSIS — Z9481 Bone marrow transplant status: Secondary | ICD-10-CM | POA: Diagnosis not present

## 2019-09-07 DIAGNOSIS — D89811 Chronic graft-versus-host disease: Secondary | ICD-10-CM | POA: Diagnosis not present

## 2019-09-07 DIAGNOSIS — I2510 Atherosclerotic heart disease of native coronary artery without angina pectoris: Secondary | ICD-10-CM | POA: Diagnosis not present

## 2019-09-07 DIAGNOSIS — I351 Nonrheumatic aortic (valve) insufficiency: Secondary | ICD-10-CM | POA: Diagnosis not present

## 2019-09-07 DIAGNOSIS — J984 Other disorders of lung: Secondary | ICD-10-CM | POA: Diagnosis not present

## 2019-09-07 DIAGNOSIS — Z7901 Long term (current) use of anticoagulants: Secondary | ICD-10-CM | POA: Diagnosis not present

## 2019-09-07 DIAGNOSIS — T865 Complications of stem cell transplant: Secondary | ICD-10-CM | POA: Diagnosis not present

## 2019-09-07 DIAGNOSIS — Z86718 Personal history of other venous thrombosis and embolism: Secondary | ICD-10-CM | POA: Diagnosis not present

## 2019-09-09 MED ORDER — GENERIC EXTERNAL MEDICATION
Status: DC
Start: ? — End: 2019-09-09

## 2019-09-09 MED ORDER — ACYCLOVIR 400 MG PO TABS
400.00 | ORAL_TABLET | ORAL | Status: DC
Start: 2019-09-09 — End: 2019-09-09

## 2019-09-09 MED ORDER — LISINOPRIL 5 MG PO TABS
2.50 | ORAL_TABLET | ORAL | Status: DC
Start: 2019-09-10 — End: 2019-09-09

## 2019-09-09 MED ORDER — FLUCONAZOLE 200 MG PO TABS
200.00 | ORAL_TABLET | ORAL | Status: DC
Start: 2019-09-10 — End: 2019-09-09

## 2019-09-09 MED ORDER — MAGNESIUM OXIDE 400 MG PO TABS
400.00 | ORAL_TABLET | ORAL | Status: DC
Start: 2019-09-09 — End: 2019-09-09

## 2019-09-09 MED ORDER — PREGABALIN 50 MG PO CAPS
50.00 | ORAL_CAPSULE | ORAL | Status: DC
Start: 2019-09-09 — End: 2019-09-09

## 2019-09-09 MED ORDER — FUROSEMIDE 20 MG PO TABS
20.00 | ORAL_TABLET | ORAL | Status: DC
Start: 2019-09-09 — End: 2019-09-09

## 2019-09-09 MED ORDER — TACROLIMUS 0.5 MG PO CAPS
0.50 | ORAL_CAPSULE | ORAL | Status: DC
Start: 2019-09-10 — End: 2019-09-09

## 2019-09-09 MED ORDER — DULOXETINE HCL 60 MG PO CPEP
60.00 | ORAL_CAPSULE | ORAL | Status: DC
Start: 2019-09-09 — End: 2019-09-09

## 2019-09-09 MED ORDER — GENERIC EXTERNAL MEDICATION
1.00 | Status: DC
Start: ? — End: 2019-09-09

## 2019-09-09 MED ORDER — LIDOCAINE HCL 1 % IJ SOLN
0.50 | INTRAMUSCULAR | Status: DC
Start: ? — End: 2019-09-09

## 2019-09-09 MED ORDER — AMOXICILLIN-POT CLAVULANATE 875-125 MG PO TABS
875.00 | ORAL_TABLET | ORAL | Status: DC
Start: 2019-09-09 — End: 2019-09-09

## 2019-09-09 MED ORDER — ACETAMINOPHEN 325 MG PO TABS
650.00 | ORAL_TABLET | ORAL | Status: DC
Start: ? — End: 2019-09-09

## 2019-09-09 MED ORDER — PANTOPRAZOLE SODIUM 40 MG PO TBEC
40.00 | DELAYED_RELEASE_TABLET | ORAL | Status: DC
Start: 2019-09-10 — End: 2019-09-09

## 2019-09-14 ENCOUNTER — Other Ambulatory Visit: Payer: Self-pay

## 2019-09-14 ENCOUNTER — Ambulatory Visit: Payer: BC Managed Care – PPO | Admitting: Family Medicine

## 2019-09-14 ENCOUNTER — Encounter: Payer: Self-pay | Admitting: Family Medicine

## 2019-09-14 VITALS — BP 130/80 | HR 38 | Temp 97.1°F | Wt 145.0 lb

## 2019-09-14 DIAGNOSIS — K056 Periodontal disease, unspecified: Secondary | ICD-10-CM | POA: Diagnosis not present

## 2019-09-14 DIAGNOSIS — D89811 Chronic graft-versus-host disease: Secondary | ICD-10-CM | POA: Diagnosis not present

## 2019-09-14 DIAGNOSIS — L03031 Cellulitis of right toe: Secondary | ICD-10-CM | POA: Diagnosis not present

## 2019-09-14 DIAGNOSIS — Z9889 Other specified postprocedural states: Secondary | ICD-10-CM

## 2019-09-14 DIAGNOSIS — K117 Disturbances of salivary secretion: Secondary | ICD-10-CM | POA: Diagnosis not present

## 2019-09-14 MED ORDER — DOXYCYCLINE HYCLATE 100 MG PO TABS: 100.0000 mg | ORAL_TABLET | Freq: Two times a day (BID) | ORAL | 0 refills | Status: DC

## 2019-09-14 NOTE — Progress Notes (Signed)
   Subjective:    Patient ID: [name], male    DOB: Aug 11, 1960, 59 y.o.   MRN: [id number]  HPI He is here for recheck on right great toe discomfort.  He states that he did quite well back in March and April while on Augmentin.  Review of record indicates he did get essentially back to normal however in the last 3 days he has noted increasing pain and swelling to the medial aspect of the distal joint.  He does have a previous history of gout. He also recently had a mitral valve repair and notes an improvement in being less short of breath, chest tightness.   Review of Systems     Objective:   Physical Exam Alert and in no distress.  Exam of his heart does show an irregular rhythm with a diastolic murmur 2/6 Previous EKG at [facility] did show frequent PVCs. Exam of the right great toe does show slight erythema and tenderness palpation to the medial aspect of the distal joint however palpation of the medial and inferior joint causes no discomfort.       Assessment & Plan:  Cellulitis of great toe of right foot - Plan: doxycycline (VIBRA-TABS) 100 MG tablet  H/O mitral valve repair I explained that this was gout, the joint should hurt no matter where I touch it.  He was comfortable with this.  I will place him on doxycycline.  He will keep me informed as to how he responds in a he does not respond appropriately, I will get another opinion. The mitral valve repair seems to be working since he is having much less symptoms.

## 2019-09-21 DIAGNOSIS — H0102A Squamous blepharitis right eye, upper and lower eyelids: Secondary | ICD-10-CM | POA: Diagnosis not present

## 2019-09-21 DIAGNOSIS — H1045 Other chronic allergic conjunctivitis: Secondary | ICD-10-CM | POA: Diagnosis not present

## 2019-09-21 DIAGNOSIS — H401110 Primary open-angle glaucoma, right eye, stage unspecified: Secondary | ICD-10-CM | POA: Diagnosis not present

## 2019-09-21 DIAGNOSIS — H16223 Keratoconjunctivitis sicca, not specified as Sjogren's, bilateral: Secondary | ICD-10-CM | POA: Diagnosis not present

## 2019-09-28 ENCOUNTER — Encounter: Payer: Self-pay | Admitting: Family Medicine

## 2019-09-28 ENCOUNTER — Telehealth (INDEPENDENT_AMBULATORY_CARE_PROVIDER_SITE_OTHER): Payer: BC Managed Care – PPO | Admitting: Family Medicine

## 2019-09-28 ENCOUNTER — Other Ambulatory Visit: Payer: Self-pay

## 2019-09-28 VITALS — Temp 97.0°F | Wt 145.0 lb

## 2019-09-28 DIAGNOSIS — R112 Nausea with vomiting, unspecified: Secondary | ICD-10-CM | POA: Diagnosis not present

## 2019-09-28 DIAGNOSIS — R519 Headache, unspecified: Secondary | ICD-10-CM

## 2019-09-28 NOTE — Progress Notes (Signed)
   Subjective:    Patient ID: [name], male    DOB: January 21, 1961, 59 y.o.   MRN: [id number]  HPI  Interactive audio and video telecommunications were attempted between this provider and patient, however he did not have access to video capability.  We continued and completed visit with audio only. The patient was located at home. The provider was located in the office. The patient did consent to this visit and is aware of possible charges through their insurance for this visit. The other persons participating in this telemedicine service were none. Time spent on call was 6 minutes This virtual service is not related to other E/M service within previous 7 days. He started having difficulty with dizziness starting on Saturday with some nausea.  He did vomit once on Sunday.  The nausea has continued as well as right-sided headache, chills and slight dizziness.  No cough, sore throat, shortness of breath, nasal congestion, PND.  He has had his Covid injections.  He was recently given Tylenol with sinus.  He is now finishing up doxycycline for treatment of a toe cellulitis.    Review of Systems     Objective:   Physical Exam Alert and in no distress otherwise not examined      Assessment & Plan:  Nausea and vomiting, intractability of vomiting not specified, unspecified vomiting type  Nonintractable headache, unspecified chronicity pattern, unspecified headache type Recommend using ondansetron which she has at home taking 8 mg 3 times per day as well as Tylenol.  If he continues have difficulty cautioned for him to go to the emergency room because of his underlying medical conditions.

## 2019-10-05 DIAGNOSIS — C9000 Multiple myeloma not having achieved remission: Secondary | ICD-10-CM | POA: Diagnosis not present

## 2019-10-05 DIAGNOSIS — E559 Vitamin D deficiency, unspecified: Secondary | ICD-10-CM | POA: Diagnosis not present

## 2019-10-05 DIAGNOSIS — Z9481 Bone marrow transplant status: Secondary | ICD-10-CM | POA: Diagnosis not present

## 2019-10-08 DIAGNOSIS — Z95818 Presence of other cardiac implants and grafts: Secondary | ICD-10-CM | POA: Diagnosis not present

## 2019-10-08 DIAGNOSIS — I059 Rheumatic mitral valve disease, unspecified: Secondary | ICD-10-CM | POA: Diagnosis not present

## 2019-10-08 DIAGNOSIS — Z9889 Other specified postprocedural states: Secondary | ICD-10-CM | POA: Diagnosis not present

## 2019-10-09 DIAGNOSIS — Z95818 Presence of other cardiac implants and grafts: Secondary | ICD-10-CM | POA: Insufficient documentation

## 2019-10-09 DIAGNOSIS — Z9889 Other specified postprocedural states: Secondary | ICD-10-CM | POA: Insufficient documentation

## 2019-10-14 ENCOUNTER — Encounter: Payer: Self-pay | Admitting: Family Medicine

## 2019-11-21 ENCOUNTER — Ambulatory Visit
Admission: EM | Admit: 2019-11-21 | Discharge: 2019-11-21 | Disposition: A | Discharge status: Home / Self Care | Payer: BC Managed Care – PPO | Attending: Physician Assistant | Admitting: Physician Assistant

## 2019-11-21 DIAGNOSIS — R05 Cough: Secondary | ICD-10-CM

## 2019-11-21 DIAGNOSIS — J3489 Other specified disorders of nose and nasal sinuses: Secondary | ICD-10-CM

## 2019-11-21 DIAGNOSIS — R059 Cough, unspecified: Secondary | ICD-10-CM

## 2019-11-21 DIAGNOSIS — R0981 Nasal congestion: Secondary | ICD-10-CM | POA: Diagnosis not present

## 2019-11-21 MED ORDER — HYDROCODONE-HOMATROPINE 5-1.5 MG/5ML PO SYRP: 5.0000 mL | ORAL_SOLUTION | Freq: Four times a day (QID) | ORAL | 0 refills | Status: DC | PRN

## 2019-11-21 MED ORDER — ALBUTEROL SULFATE HFA 108 (90 BASE) MCG/ACT IN AERS: 2.0000 | INHALATION_SPRAY | RESPIRATORY_TRACT | 0 refills | Status: DC | PRN

## 2019-11-21 MED ORDER — BENZONATATE 200 MG PO CAPS: 200.0000 mg | ORAL_CAPSULE | Freq: Three times a day (TID) | ORAL | 0 refills | Status: DC

## 2019-11-21 NOTE — ED Triage Notes (Signed)
Patient presents with c/o sore throat that started on Monday and has progressed to a runny nose and productive cough with yellow drainage.

## 2019-11-21 NOTE — Discharge Instructions (Addendum)
COVID PCR testing ordered. I would like you to quarantine until testing results. Tessalon for cough. Hycodan as needed for cough. You can use over the counter nasal saline rinse such as neti pot for nasal congestion. Keep hydrated, your urine should be clear to pale yellow in color. Tylenol/motrin for fever and pain. Monitor for any worsening of symptoms, chest pain, shortness of breath, wheezing, swelling of the throat, go to the emergency department for further evaluation needed.

## 2019-11-21 NOTE — ED Provider Notes (Signed)
[facility]    CSN: [id number] Arrival date & time: 11/21/19  1525      History   Chief Complaint Chief Complaint  Patient presents with  . Cough  . Nasal Congestion  . Sore Throat    HPI [name] is a 59 y.o. male.   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 Rhinorrhea, nasal congestion, cough, sore throat. Denies fever, chills, body aches. Denies abdominal pain, nausea, vomiting, diarrhea. Denies shortness of breath, loss of taste/smell.      Past Medical History:  Diagnosis Date  . Anxiety   . Aortic valve insufficiency, senile calcific October 14   Moderate regurgitation, eccentric towards Ant MV Leaflet  . Bence Jones proteinuria 09/01/2012   Borderline increase protein 190 mg on 24 hr urine IFE with free kappa light chains (lab normal 50-100 mg)  08/18/12  . CAD in native artery    s/p CABG; -- Cards: Dr. D. Harding, [facility]; [facility] 01/2013 EF 57%, No ischemia or Infarction.  . Erectile dysfunction   . Essential hypertension   . H/O cardiovascular stress test 02/22/10   normal perfusion, no ischemia or infarct; treadmill and Myoview perfusion study; Dr. Solomon  . Hyperlipidemia LDL goal <70   . Leukopenia 09/01/2012   WBC 3,700 42 poly, 48 lymphs, 9 monos 08/18/12  Was 3,700 1 year ago  . 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 Moderate mitral regurgitation by prior echocardiogram October 2014   Echo 02/2013: Severe holosystolic Ant MV Leaflet prolapse, Moderate regurgitation.  Mildly dilated LV with normal systolic function (EF 55-60%), moderate LA dilation, upper normal PA pressures ~ 34 mmHg.  . Monoclonal gammopathy 09/01/2012   Increased total protein, increased beta globulin peak with decreased gammaglobulin peak on SPEP 08/18/12  IFE not done "possible faint abnormal band"  . Multiple myeloma, without mention of having  achieved remission 10/14/2012   Stage I Durie-Salmon; good prognosis international scoring system: see 10/13/12 progress note --> ? in Remission as of 06/19/2013; ? Failed Auto BMT; Continues to be on Chemo  with planned Donor BMT  . Normochromic normocytic anemia 09/01/2012   Hb 12.7, MCV 95  08/18/12 was 13.7 1 year ago; concomitant leukopenia  & increased lymphs  . Pneumococcal vaccine refused 08/15/2012    Patient Active Problem List   Diagnosis Date Noted  . S/P mitral valve clip implantation 10/09/2019  . 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 Dyslipidemia, goal LDL below 70 04/03/2014  . Aortic valve insufficiency, senile calcific   . Multiple myeloma in remission (HCC) 06/19/2013  . Moderate mitral regurgitation by prior echocardiogram 02/25/2013  . Mitral valve anterior leaflet prolapse 02/25/2013  . Hx of valvular heart disease - MVP with midl-mod MR, Mild AI with Aortic Sclerosis 02/16/2013  . CAD in native artery -- s/p CABG 02/16/2013  . Monoclonal gammopathy 09/01/2012  . Normochromic normocytic anemia 09/01/2012  . Leukopenia 09/01/2012  . Hx of CABG 08/02/2011  . Hyperlipidemia LDL goal <70 08/02/2011  . Allergic rhinitis, mild 08/02/2011  . Arthralgia of hand 10/11/2010  . Back pain 10/11/2010  . Fatigue 10/11/2010  . ED (erectile dysfunction) 10/11/2010    Past Surgical History:  Procedure Laterality Date  . COLONOSCOPY     ?  . CORONARY ARTERY BYPASS GRAFT  10/2003   LIMA to LAD, RIMA to   OM1, sequential to OM2; Dr. Elisabeth Cara  . MITRAL VALVE SURGERY         Home Medications    Prior to Admission medications   Medication Sig Start Date End Date Taking? Authorizing Provider  acyclovir (ZOVIRAX) 400 MG tablet TAKE ONE TABLET BY MOUTH TWICE DAILY    Granfortuna, Alyson Locket, MD  albuterol (VENTOLIN HFA) 108 (90 Base) MCG/ACT inhaler Inhale 2 puffs into the lungs every 4 (four) hours as needed for wheezing or shortness of breath (or cough). 11/21/19   Tasia Catchings,  Beckhem Isadore V, PA-C  Apixaban (ELIQUIS PO) Take 1 tablet by mouth 2 (two) times daily.    [provider]  benzonatate (TESSALON) 200 MG capsule Take 1 capsule (200 mg total) by mouth every 8 (eight) hours. 11/21/19   Tasia Catchings, Kaidan Spengler V, PA-C  cholecalciferol (VITAMIN D) 1000 units tablet Take 1,000 Units by mouth once a week.    [provider]  dorzolamide-timolol (COSOPT) 22.3-6.8 MG/ML ophthalmic solution 1 drop 2 (two) times daily.    [provider]  DULoxetine (CYMBALTA) 30 MG capsule Take 30 mg by mouth daily.    [provider]  ezetimibe-simvastatin (VYTORIN) 10-20 MG tablet Take 1 tablet by mouth daily.    [provider]  HYDROcodone-homatropine (HYCODAN) 5-1.5 MG/5ML syrup Take 5 mLs by mouth every 6 (six) hours as needed for cough. 11/21/19   Tasia Catchings, Jabes Primo V, PA-C  lisinopril (PRINIVIL,ZESTRIL) 2.5 MG tablet Take 2.5 mg by mouth daily.    [provider]  omeprazole (PRILOSEC) 40 MG capsule Take by mouth daily. 11/10/18   [provider]  ondansetron (ZOFRAN-ODT) 8 MG disintegrating tablet DISSOLVE 1 TABLET IN MOUTH EVERY 8 HOURS AS NEEDED FOR NAUSEA FOR UP TO 3 DAYS 11/02/18   [provider]  oxyCODONE (OXY IR/ROXICODONE) 5 MG immediate release tablet Take by mouth. 03/26/18   [provider]  pregabalin (LYRICA) 100 MG capsule Take 100 mg by mouth 2 (two) times daily.    [provider]  sildenafil (REVATIO) 20 MG tablet Take 1 to 5 pills as needed 11/29/16   Denita Lung, MD  Vitamin D, Ergocalciferol, (DRISDOL) 1.25 MG (50000 UT) CAPS capsule Take by mouth. 11/06/18   [provider]  allopurinol (ZYLOPRIM) 100 MG tablet Take 100 mg by mouth daily.  11/21/19  [provider]  amLODipine (NORVASC) 5 MG tablet Take 5 mg by mouth daily.  11/21/19  [provider]  azelastine (ASTELIN) 0.1 % nasal spray Place 2 sprays into both nostrils 2 (two) times daily. Use in each nostril as directed Patient  not taking: Reported on 09/14/2019 01/03/16 11/21/19  Denita Lung, MD  mometasone (NASONEX) 50 MCG/ACT nasal spray Place 2 sprays into the nose as needed. 09/25/12 11/21/19  Denita Lung, MD    Family History Family History  Problem Relation Age of Onset  . Heart disease Father   . Hypertension Father   . Heart failure Father   . Heart attack Brother        at 67  . Heart attack Brother        at 20    Social History Social History   Tobacco Use  . Smoking status: Never Smoker  . Smokeless tobacco: Never Used  Substance Use Topics  . Alcohol use: No  . Drug use: No     Allergies   Atorvastatin, Chlorhexidine gluconate, and Toprol xl [metoprolol tartrate]   Review of Systems Review of Systems  Reason  unable to perform ROS: See HPI as above.     Physical Exam Triage Vital Signs ED Triage Vitals  Enc Vitals Group     BP 11/21/19 1540 (!) 149/94     Pulse Rate 11/21/19 1540 77     Resp 11/21/19 1540 16     Temp 11/21/19 1540 99.1 F (37.3 C)     Temp Source 11/21/19 1540 Oral     SpO2 11/21/19 1540 97 %     Weight --      Height --      Head Circumference --      Peak Flow --      Pain Score 11/21/19 1542 4     Pain Loc --      Pain Edu? --      Excl. in [place]? --    No data found.  Updated Vital Signs BP (!) 149/94   Pulse 77   Temp 99.1 F (37.3 C) (Oral)   Resp 16   SpO2 97%   Physical Exam Constitutional:      General: He is not in acute distress.    Appearance: He is well-developed. He is not ill-appearing, toxic-appearing or diaphoretic.  HENT:     Head: Normocephalic and atraumatic.     Right Ear: Tympanic membrane, ear canal and external ear normal. Tympanic membrane is not erythematous or bulging.     Left Ear: Tympanic membrane, ear canal and external ear normal. Tympanic membrane is not erythematous or bulging.     Nose:     Right Sinus: No maxillary sinus tenderness or frontal sinus tenderness.     Left Sinus: No maxillary sinus  tenderness or frontal sinus tenderness.     Mouth/Throat:     Mouth: Mucous membranes are moist.     Pharynx: Oropharynx is clear. Uvula midline.  Eyes:     Conjunctiva/sclera: Conjunctivae normal.     Pupils: Pupils are equal, round, and reactive to light.  Cardiovascular:     Rate and Rhythm: Normal rate and regular rhythm.  Pulmonary:     Effort: Pulmonary effort is normal. No accessory muscle usage, prolonged expiration, respiratory distress or retractions.     Breath sounds: No decreased air movement or transmitted upper airway sounds. No decreased breath sounds.     Comments: LCTAB Musculoskeletal:     Cervical back: Normal range of motion and neck supple.  Skin:    General: Skin is warm and dry.  Neurological:     Mental Status: He is alert and oriented to person, place, and time.      UC Treatments / Results  Labs (all labs ordered are listed, but only abnormal results are displayed) Labs Reviewed  NOVEL CORONAVIRUS, NAA    EKG   Radiology No results found.  Procedures Procedures (including critical care time)  Medications Ordered in UC Medications - No data to display  Initial Impression / Assessment and Plan / UC Course  I have reviewed the triage vital signs and the nursing notes.  Pertinent labs & imaging results that were available during my care of the patient were reviewed by me and considered in my medical decision making (see chart for details).    Patient afebrile, nontoxic in appearance.  Stable vitals.  Lungs clear to auscultation bilaterally without adventitious lung sounds.  Although patient fully vaccinated for Covid, given on immunosuppressants, will swab for Covid for further evaluation.  Offered chest x-ray versus monitoring at this time, patient would like to   defer chest x-ray for now.  Will start symptomatic management.  Strict return precautions given.  Patient expresses understanding and agrees to plan.  Final Clinical Impressions(s) /  UC Diagnoses   Final diagnoses:  Cough  Rhinorrhea  Nasal congestion    ED Prescriptions    Medication Sig Dispense Auth. Provider   albuterol (VENTOLIN HFA) 108 (90 Base) MCG/ACT inhaler Inhale 2 puffs into the lungs every 4 (four) hours as needed for wheezing or shortness of breath (or cough). 18 g Dai Apel V, PA-C   HYDROcodone-homatropine (HYCODAN) 5-1.5 MG/5ML syrup Take 5 mLs by mouth every 6 (six) hours as needed for cough. 120 mL Raynesha Tiedt V, PA-C   benzonatate (TESSALON) 200 MG capsule Take 1 capsule (200 mg total) by mouth every 8 (eight) hours. 21 capsule Tasia Catchings, Marius Betts V, PA-C     I have reviewed the PDMP during this encounter.   Ok Edwards, PA-C 11/21/19 1626

## 2019-11-23 LAB — SARS-COV-2, NAA 2 DAY TAT

## 2019-11-23 LAB — NOVEL CORONAVIRUS, NAA: SARS-CoV-2, NAA: NOT DETECTED

## 2019-11-27 ENCOUNTER — Ambulatory Visit
Admission: EM | Admit: 2019-11-27 | Discharge: 2019-11-27 | Disposition: A | Discharge status: Home / Self Care | Payer: BC Managed Care – PPO | Attending: Emergency Medicine | Admitting: Emergency Medicine

## 2019-11-27 ENCOUNTER — Other Ambulatory Visit: Payer: Self-pay

## 2019-11-27 ENCOUNTER — Encounter: Payer: Self-pay | Admitting: Emergency Medicine

## 2019-11-27 DIAGNOSIS — L309 Dermatitis, unspecified: Secondary | ICD-10-CM | POA: Diagnosis not present

## 2019-11-27 MED ORDER — BETAMETHASONE VALERATE 0.1 % EX OINT: 1.0000 "application " | TOPICAL_OINTMENT | Freq: Two times a day (BID) | CUTANEOUS | 1 refills | Status: DC

## 2019-11-27 NOTE — ED Provider Notes (Signed)
[facility]    CSN: [id number] Arrival date & time: 11/27/19  1250      History   Chief Complaint Chief Complaint  Patient presents with  . hand itching    HPI [name] is a 58 y.o. 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  Denies known bug bite, change in topical products.  Has tried triamcinolone, hydrocortisone without relief.  No fever, malaise, arthralgias, myalgias, recent change in medications, diet, lifestyle.   Past Medical History:  Diagnosis Date  . Anxiety   . Aortic valve insufficiency, senile calcific October 14   Moderate regurgitation, eccentric towards Ant MV Leaflet  . Bence Jones proteinuria 09/01/2012   Borderline increase protein 190 mg on 24 hr urine IFE with free kappa light chains (lab normal 50-100 mg)  08/18/12  . CAD in native artery    s/p CABG; -- Cards: Dr. Roni Bread, [facility]; [facility] 01/2013 EF 57%, No ischemia or Infarction.  . Erectile dysfunction   . Essential hypertension   . H/O cardiovascular stress test 02/22/10   normal perfusion, no ischemia or infarct; treadmill and Myoview perfusion study; Dr. Elisabeth Cara  . Hyperlipidemia LDL goal <70   . Leukopenia 09/01/2012   WBC 3,700 42 poly, 48 lymphs, 9 monos 08/18/12  Was 3,700 1 year ago  . 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 Moderate mitral regurgitation by prior echocardiogram October 2014   Echo 02/2013: Severe holosystolic Ant MV Leaflet prolapse, Moderate regurgitation.  Mildly dilated LV with normal systolic function (EF 52-84%), moderate LA dilation, upper normal PA pressures ~ 34 mmHg.  . Monoclonal gammopathy 09/01/2012   Increased total protein, increased beta globulin peak with decreased gammaglobulin peak on SPEP 08/18/12  IFE not done "possible faint abnormal band"  .  Multiple myeloma, without mention of having achieved remission 10/14/2012   Stage I Durie-Salmon; good prognosis international scoring system: see 10/13/12 progress note --> ? in Remission as of 06/19/2013; ? Failed Auto BMT; Continues to be on Chemo  with planned Donor BMT  . Normochromic normocytic anemia 09/01/2012   Hb 12.7, MCV 95  08/18/12 was 13.7 1 year ago; concomitant leukopenia  & increased lymphs  . Pneumococcal vaccine refused 08/15/2012    Patient Active Problem List   Diagnosis Date Noted  . S/P mitral valve clip implantation 10/09/2019  . 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 Dyslipidemia, goal LDL below 70 04/03/2014  . Aortic valve insufficiency, senile calcific   . Multiple myeloma in remission ([place]) 06/19/2013  . Moderate mitral regurgitation by prior echocardiogram 02/25/2013  . Mitral valve anterior leaflet prolapse 02/25/2013  . Hx of valvular heart disease - MVP with midl-mod MR, Mild AI with Aortic Sclerosis 02/16/2013  . CAD in native artery -- s/p CABG 02/16/2013  . Monoclonal gammopathy 09/01/2012  . Normochromic normocytic anemia 09/01/2012  . Leukopenia 09/01/2012  . Hx of CABG 08/02/2011  . Hyperlipidemia LDL goal <70 08/02/2011  . Allergic rhinitis, mild 08/02/2011  . Arthralgia of hand 10/11/2010  . Back pain 10/11/2010  . Fatigue 10/11/2010  . ED (erectile dysfunction) 10/11/2010    Past Surgical History:  Procedure Laterality Date  . COLONOSCOPY     ?  . CORONARY ARTERY BYPASS GRAFT  10/2003   LIMA to LAD, RIMA to OM1, sequential to OM2; Dr.  Elisabeth Cara  . MITRAL VALVE SURGERY         Home Medications    Prior to Admission medications   Medication Sig Start Date End Date Taking? Authorizing Provider  acyclovir (ZOVIRAX) 400 MG tablet TAKE ONE TABLET BY MOUTH TWICE DAILY    Granfortuna, Alyson Locket, MD  albuterol (VENTOLIN HFA) 108 (90 Base) MCG/ACT inhaler Inhale 2 puffs into the lungs every 4 (four) hours as needed for wheezing or  shortness of breath (or cough). 11/21/19   Tasia Catchings, Amy V, PA-C  Apixaban (ELIQUIS PO) Take 1 tablet by mouth 2 (two) times daily.    [provider]  benzonatate (TESSALON) 200 MG capsule Take 1 capsule (200 mg total) by mouth every 8 (eight) hours. 11/21/19   Tasia Catchings, Amy V, PA-C  betamethasone valerate ointment (VALISONE) 0.1 % Apply 1 application topically 2 (two) times daily. 11/27/19   Hall-Potvin, [place], PA-C  cholecalciferol (VITAMIN D) 1000 units tablet Take 1,000 Units by mouth once a week.    [provider]  dorzolamide-timolol (COSOPT) 22.3-6.8 MG/ML ophthalmic solution 1 drop 2 (two) times daily.    [provider]  DULoxetine (CYMBALTA) 30 MG capsule Take 30 mg by mouth daily.    [provider]  ezetimibe-simvastatin (VYTORIN) 10-20 MG tablet Take 1 tablet by mouth daily.    [provider]  HYDROcodone-homatropine (HYCODAN) 5-1.5 MG/5ML syrup Take 5 mLs by mouth every 6 (six) hours as needed for cough. 11/21/19   Tasia Catchings, Amy V, PA-C  lisinopril (PRINIVIL,ZESTRIL) 2.5 MG tablet Take 2.5 mg by mouth daily.    [provider]  omeprazole (PRILOSEC) 40 MG capsule Take by mouth daily. 11/10/18   [provider]  ondansetron (ZOFRAN-ODT) 8 MG disintegrating tablet DISSOLVE 1 TABLET IN MOUTH EVERY 8 HOURS AS NEEDED FOR NAUSEA FOR UP TO 3 DAYS 11/02/18   [provider]  oxyCODONE (OXY IR/ROXICODONE) 5 MG immediate release tablet Take by mouth. 03/26/18   [provider]  pregabalin (LYRICA) 100 MG capsule Take 100 mg by mouth 2 (two) times daily.    [provider]  sildenafil (REVATIO) 20 MG tablet Take 1 to 5 pills as needed 11/29/16   Denita Lung, MD  Vitamin D, Ergocalciferol, (DRISDOL) 1.25 MG (50000 UT) CAPS capsule Take by mouth. 11/06/18   [provider]  allopurinol (ZYLOPRIM) 100 MG tablet Take 100 mg by mouth daily.  11/21/19  [provider]  amLODipine (NORVASC) 5 MG tablet Take 5 mg by  mouth daily.  11/21/19  [provider]  azelastine (ASTELIN) 0.1 % nasal spray Place 2 sprays into both nostrils 2 (two) times daily. Use in each nostril as directed Patient not taking: Reported on 09/14/2019 01/03/16 11/21/19  Denita Lung, MD  mometasone (NASONEX) 50 MCG/ACT nasal spray Place 2 sprays into the nose as needed. 09/25/12 11/21/19  Denita Lung, MD    Family History Family History  Problem Relation Age of Onset  . Heart disease Father   . Hypertension Father   . Heart failure Father   . Heart attack Brother        at 36  . Heart attack Brother        at 53    Social History Social History   Tobacco Use  . Smoking status: Never Smoker  . Smokeless tobacco: Never Used  Substance Use Topics  . Alcohol use: No  . Drug use: No     Allergies   Atorvastatin, Chlorhexidine  gluconate, and Toprol xl [metoprolol tartrate]   Review of Systems As per HPI   Physical Exam Triage Vital Signs ED Triage Vitals  Enc Vitals Group     BP      Pulse      Resp      Temp      Temp src      SpO2      Weight      Height      Head Circumference      Peak Flow      Pain Score      Pain Loc      Pain Edu?      Excl. in [place]?    No data found.  Updated Vital Signs BP 122/64 (BP Location: Right Arm)   Pulse (!) 58   Temp 98 F (36.7 C) (Oral)   Resp 16   SpO2 96%   Visual Acuity Right Eye Distance:   Left Eye Distance:   Bilateral Distance:    Right Eye Near:   Left Eye Near:    Bilateral Near:     Physical Exam Constitutional:      General: He is not in acute distress. HENT:     Head: Normocephalic and atraumatic.  Eyes:     General: No scleral icterus.    Pupils: Pupils are equal, round, and reactive to light.  Cardiovascular:     Rate and Rhythm: Normal rate.  Pulmonary:     Effort: Pulmonary effort is normal. No respiratory distress.     Breath sounds: No wheezing.  Skin:    Coloration: Skin is not jaundiced or pale.     Comments: 4  mm area of erythema without warmth, tenderness, mass and center of right palm.  No TTP  Neurological:     Mental Status: He is alert and oriented to person, place, and time.      UC Treatments / Results  Labs (all labs ordered are listed, but only abnormal results are displayed) Labs Reviewed - No data to display  EKG   Radiology No results found.  Procedures Procedures (including critical care time)  Medications Ordered in UC Medications - No data to display  Initial Impression / Assessment and Plan / UC Course  I have reviewed the triage vital signs and the nursing notes.  Pertinent labs & imaging results that were available during my care of the patient were reviewed by me and considered in my medical decision making (see chart for details).     Patient febrile, nontoxic, without systemic symptoms as mentioned in HPI.  We will treat supportively as outlined below.  Avoid systemic steroids given chronic use second to comorbidities.  Patient has a dermatologist, with whom he can follow-up later this week.  Return precautions discussed, patient verbalized understanding and is agreeable to plan. Final Clinical Impressions(s) / UC Diagnoses   Final diagnoses:  Dermatitis     Discharge Instructions     Apply ointment 2 times a day. Follow up with dermatology.    ED Prescriptions    Medication Sig Dispense Auth. Provider   betamethasone valerate ointment (VALISONE) 0.1 % Apply 1 application topically 2 (two) times daily. 15 g Hall-Potvin, [place], PA-C     PDMP not reviewed this encounter.   Hall-Potvin, [place], [place] 11/27/19 1322

## 2019-11-27 NOTE — Discharge Instructions (Signed)
Apply ointment 2 times a day. Follow up with dermatology.

## 2019-11-27 NOTE — ED Notes (Signed)
Patient able to ambulate independently  

## 2019-11-27 NOTE — ED Triage Notes (Signed)
Pt presents to [facility] for assessment of right hand itching x 3 days, with a small area of redness noted to palm.  Denies known exposure.

## 2019-12-14 DIAGNOSIS — D89811 Chronic graft-versus-host disease: Secondary | ICD-10-CM | POA: Diagnosis not present

## 2019-12-14 DIAGNOSIS — K117 Disturbances of salivary secretion: Secondary | ICD-10-CM | POA: Diagnosis not present

## 2019-12-14 DIAGNOSIS — K056 Periodontal disease, unspecified: Secondary | ICD-10-CM | POA: Diagnosis not present

## 2019-12-28 DIAGNOSIS — Z9481 Bone marrow transplant status: Secondary | ICD-10-CM | POA: Diagnosis not present

## 2019-12-28 DIAGNOSIS — C9000 Multiple myeloma not having achieved remission: Secondary | ICD-10-CM | POA: Diagnosis not present

## 2019-12-29 DIAGNOSIS — C9001 Multiple myeloma in remission: Secondary | ICD-10-CM | POA: Diagnosis not present

## 2019-12-29 DIAGNOSIS — C9000 Multiple myeloma not having achieved remission: Secondary | ICD-10-CM | POA: Diagnosis not present

## 2019-12-29 DIAGNOSIS — D7589 Other specified diseases of blood and blood-forming organs: Secondary | ICD-10-CM | POA: Diagnosis not present

## 2019-12-29 DIAGNOSIS — Z9481 Bone marrow transplant status: Secondary | ICD-10-CM | POA: Diagnosis not present

## 2020-01-04 DIAGNOSIS — L570 Actinic keratosis: Secondary | ICD-10-CM | POA: Diagnosis not present

## 2020-01-04 DIAGNOSIS — L819 Disorder of pigmentation, unspecified: Secondary | ICD-10-CM | POA: Diagnosis not present

## 2020-01-09 ENCOUNTER — Encounter: Payer: Self-pay | Admitting: Family Medicine

## 2020-01-18 ENCOUNTER — Other Ambulatory Visit: Payer: Self-pay

## 2020-01-18 ENCOUNTER — Ambulatory Visit
Admission: EM | Admit: 2020-01-18 | Discharge: 2020-01-18 | Disposition: A | Discharge status: Home / Self Care | Payer: BC Managed Care – PPO | Attending: Family Medicine | Admitting: Family Medicine

## 2020-01-18 DIAGNOSIS — R0981 Nasal congestion: Secondary | ICD-10-CM

## 2020-01-18 DIAGNOSIS — R059 Cough, unspecified: Secondary | ICD-10-CM

## 2020-01-18 MED ORDER — BENZONATATE 200 MG PO CAPS: 200.0000 mg | ORAL_CAPSULE | Freq: Three times a day (TID) | ORAL | 0 refills | Status: DC

## 2020-01-18 MED ORDER — HYDROCODONE-HOMATROPINE 5-1.5 MG/5ML PO SYRP: 5.0000 mL | ORAL_SOLUTION | Freq: Four times a day (QID) | ORAL | 0 refills | Status: DC | PRN

## 2020-01-18 NOTE — Discharge Instructions (Signed)
We have tested you for Covid.  We will call with any positive results. Cough medicine as needed. Follow up as needed for continued or worsening symptoms

## 2020-01-18 NOTE — ED Triage Notes (Signed)
Pt presents with complaints of nasal congestion, runny nose, and cough x 3 days. Reports being vaccinated for covid. Denies any fevers, chest pain, or shortness of breath.

## 2020-01-19 NOTE — ED Provider Notes (Signed)
[place]    CSN: [id number] Arrival date & time: 01/18/20  1336      History   Chief Complaint Chief Complaint  Patient presents with  . Nasal Congestion    HPI [name] is a 59 y.o. male.   Patient is a 40-year-old male presents today with nasal congestion, runny nose, cough x3 days.  Symptoms been constant.  Has been fully vaccinated.  Denies any excessive fever, chills, body aches, chest pain or shortness of breath.  Patient does have history of allergies.  Reports he is unable to take any kind of antihistamine or Flonase for the symptoms.  Typically just waits on them to improve.      Past Medical History:  Diagnosis Date  . Anxiety   . Aortic valve insufficiency, senile calcific October 14   Moderate regurgitation, eccentric towards Ant MV Leaflet  . Bence Jones proteinuria 09/01/2012   Borderline increase protein 190 mg on 24 hr urine IFE with free kappa light chains (lab normal 50-100 mg)  08/18/12  . CAD in native artery    s/p CABG; -- Cards: Dr. Roni Bread, [facility]; [facility] 01/2013 EF 57%, No ischemia or Infarction.  . Erectile dysfunction   . Essential hypertension   . H/O cardiovascular stress test 02/22/10   normal perfusion, no ischemia or infarct; treadmill and Myoview perfusion study; Dr. Elisabeth Cara  . Hyperlipidemia LDL goal <70   . Leukopenia 09/01/2012   WBC 3,700 42 poly, 48 lymphs, 9 monos 08/18/12  Was 3,700 1 year ago  . 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 Moderate mitral regurgitation by prior echocardiogram October 2014   Echo 02/2013: Severe holosystolic Ant MV Leaflet prolapse, Moderate regurgitation.  Mildly dilated LV with normal systolic function (EF 40-98%), moderate LA dilation, upper normal PA pressures ~ 34 mmHg.  . Monoclonal gammopathy 09/01/2012   Increased total protein, increased beta globulin peak with decreased gammaglobulin peak on SPEP 08/18/12  IFE not done "possible faint  abnormal band"  . Multiple myeloma, without mention of having achieved remission 10/14/2012   Stage I Durie-Salmon; good prognosis international scoring system: see 10/13/12 progress note --> ? in Remission as of 06/19/2013; ? Failed Auto BMT; Continues to be on Chemo  with planned Donor BMT  . Normochromic normocytic anemia 09/01/2012   Hb 12.7, MCV 95  08/18/12 was 13.7 1 year ago; concomitant leukopenia  & increased lymphs  . Pneumococcal vaccine refused 08/15/2012    Patient Active Problem List   Diagnosis Date Noted  . S/P mitral valve clip implantation 10/09/2019  . 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 Dyslipidemia, goal LDL below 70 04/03/2014  . Aortic valve insufficiency, senile calcific   . Multiple myeloma in remission ([place]) 06/19/2013  . Moderate mitral regurgitation by prior echocardiogram 02/25/2013  . Mitral valve anterior leaflet prolapse 02/25/2013  . Hx of valvular heart disease - MVP with midl-mod MR, Mild AI with Aortic Sclerosis 02/16/2013  . CAD in native artery -- s/p CABG 02/16/2013  . Monoclonal gammopathy 09/01/2012  . Normochromic normocytic anemia 09/01/2012  . Leukopenia 09/01/2012  . Hx of CABG 08/02/2011  . Hyperlipidemia LDL goal <70 08/02/2011  . Allergic rhinitis, mild 08/02/2011  . Arthralgia of hand 10/11/2010  . Back pain 10/11/2010  . Fatigue 10/11/2010  . ED (erectile dysfunction) 10/11/2010    Past Surgical History:  Procedure Laterality Date  . COLONOSCOPY     ?  Marland Kitchen  CORONARY ARTERY BYPASS GRAFT  10/2003   LIMA to LAD, RIMA to OM1, sequential to OM2; Dr. Elisabeth Cara  . MITRAL VALVE SURGERY         Home Medications    Prior to Admission medications   Medication Sig Start Date End Date Taking? Authorizing Provider  acyclovir (ZOVIRAX) 400 MG tablet TAKE ONE TABLET BY MOUTH TWICE DAILY    Granfortuna, Alyson Locket, MD  albuterol (VENTOLIN HFA) 108 (90 Base) MCG/ACT inhaler Inhale 2 puffs into the lungs every 4 (four) hours as needed  for wheezing or shortness of breath (or cough). 11/21/19   Tasia Catchings, Amy V, PA-C  Apixaban (ELIQUIS PO) Take 1 tablet by mouth 2 (two) times daily.    [provider]  benzonatate (TESSALON) 200 MG capsule Take 1 capsule (200 mg total) by mouth every 8 (eight) hours. 01/18/20   Loura Halt A, NP  betamethasone valerate ointment (VALISONE) 0.1 % Apply 1 application topically 2 (two) times daily. 11/27/19   Hall-Potvin, [place], PA-C  cholecalciferol (VITAMIN D) 1000 units tablet Take 1,000 Units by mouth once a week.    [provider]  dorzolamide-timolol (COSOPT) 22.3-6.8 MG/ML ophthalmic solution 1 drop 2 (two) times daily.    [provider]  DULoxetine (CYMBALTA) 30 MG capsule Take 30 mg by mouth daily.    [provider]  ezetimibe-simvastatin (VYTORIN) 10-20 MG tablet Take 1 tablet by mouth daily.    [provider]  HYDROcodone-homatropine (HYCODAN) 5-1.5 MG/5ML syrup Take 5 mLs by mouth every 6 (six) hours as needed for cough. 01/18/20   Loura Halt A, NP  lisinopril (PRINIVIL,ZESTRIL) 2.5 MG tablet Take 2.5 mg by mouth daily.    [provider]  omeprazole (PRILOSEC) 40 MG capsule Take by mouth daily. 11/10/18   [provider]  ondansetron (ZOFRAN-ODT) 8 MG disintegrating tablet DISSOLVE 1 TABLET IN MOUTH EVERY 8 HOURS AS NEEDED FOR NAUSEA FOR UP TO 3 DAYS 11/02/18   [provider]  oxyCODONE (OXY IR/ROXICODONE) 5 MG immediate release tablet Take by mouth. 03/26/18   [provider]  pregabalin (LYRICA) 100 MG capsule Take 100 mg by mouth 2 (two) times daily.    [provider]  sildenafil (REVATIO) 20 MG tablet Take 1 to 5 pills as needed 11/29/16   Denita Lung, MD  Vitamin D, Ergocalciferol, (DRISDOL) 1.25 MG (50000 UT) CAPS capsule Take by mouth. 11/06/18   [provider]  allopurinol (ZYLOPRIM) 100 MG tablet Take 100 mg by mouth daily.  11/21/19  [provider]  amLODipine (NORVASC) 5 MG  tablet Take 5 mg by mouth daily.  11/21/19  [provider]  azelastine (ASTELIN) 0.1 % nasal spray Place 2 sprays into both nostrils 2 (two) times daily. Use in each nostril as directed Patient not taking: Reported on 09/14/2019 01/03/16 11/21/19  Denita Lung, MD  mometasone (NASONEX) 50 MCG/ACT nasal spray Place 2 sprays into the nose as needed. 09/25/12 11/21/19  Denita Lung, MD    Family History Family History  Problem Relation Age of Onset  . Heart disease Father   . Hypertension Father   . Heart failure Father   . Heart attack Brother        at 34  . Heart attack Brother        at 27  . Healthy Mother     Social History Social History   Tobacco Use  . Smoking status: Never Smoker  . Smokeless tobacco: Never Used  Substance Use Topics  . Alcohol use: No  . Drug use: No     Allergies   Atorvastatin, Chlorhexidine gluconate, and Toprol xl [metoprolol tartrate]   Review of Systems Review of Systems   Physical Exam Triage Vital Signs ED Triage Vitals  Enc Vitals Group     BP 01/18/20 1403 108/74     Pulse Rate 01/18/20 1403 72     Resp 01/18/20 1403 20     Temp 01/18/20 1403 98.4 F (36.9 C)     Temp src --      SpO2 01/18/20 1403 94 %     Weight --      Height --      Head Circumference --      Peak Flow --      Pain Score 01/18/20 1404 0     Pain Loc --      Pain Edu? --      Excl. in [place]? --    No data found.  Updated Vital Signs BP 108/74   Pulse 72   Temp 98.4 F (36.9 C)   Resp 20   SpO2 94%   Visual Acuity Right Eye Distance:   Left Eye Distance:   Bilateral Distance:    Right Eye Near:   Left Eye Near:    Bilateral Near:     Physical Exam Vitals and nursing note reviewed.  Constitutional:      Appearance: Normal appearance.  HENT:     Head: Normocephalic and atraumatic.     Right Ear: Tympanic membrane and ear canal normal.     Left Ear: Tympanic membrane and ear canal normal.     Nose: Nose normal.  Eyes:      Conjunctiva/sclera: Conjunctivae normal.  Cardiovascular:     Rate and Rhythm: Normal rate and regular rhythm.  Pulmonary:     Effort: Pulmonary effort is normal.     Breath sounds: Normal breath sounds.  Musculoskeletal:        General: Normal range of motion.     Cervical back: Normal range of motion.  Skin:    General: Skin is warm and dry.  Neurological:     Mental Status: He is alert.  Psychiatric:        Mood and Affect: Mood normal.      UC Treatments / Results  Labs (all labs ordered are listed, but only abnormal results are displayed) Labs Reviewed  NOVEL CORONAVIRUS, NAA    EKG   Radiology No results found.  Procedures Procedures (including critical care time)  Medications Ordered in UC Medications - No data to display  Initial Impression / Assessment and Plan / UC Course  I have reviewed the triage vital signs and the nursing notes.  Pertinent labs & imaging results that were available during my care of the patient were reviewed by me and considered in my medical decision making (see chart for details).     Nasal congestion and cough Covid swab pending. Prescribed cough medicine to use as needed Follow up as needed for continued or worsening symptoms  Final Clinical Impressions(s) / UC Diagnoses   Final diagnoses:  Nasal congestion  Cough     Discharge Instructions     We have tested you for Covid.  We will call with any positive results. Cough medicine as needed. Follow up as needed for continued or worsening symptoms     ED Prescriptions    Medication Sig Dispense Auth. Provider   benzonatate (  TESSALON) 200 MG capsule Take 1 capsule (200 mg total) by mouth every 8 (eight) hours. 21 capsule Areonna Bran A, NP   HYDROcodone-homatropine (HYCODAN) 5-1.5 MG/5ML syrup Take 5 mLs by mouth every 6 (six) hours as needed for cough. 120 mL Thi Sisemore A, NP     PDMP not reviewed this encounter.   Loura Halt A, NP 01/19/20 1030

## 2020-01-20 LAB — NOVEL CORONAVIRUS, NAA: SARS-CoV-2, NAA: NOT DETECTED

## 2020-01-20 LAB — SARS-COV-2, NAA 2 DAY TAT

## 2020-02-12 ENCOUNTER — Emergency Department (HOSPITAL_BASED_OUTPATIENT_CLINIC_OR_DEPARTMENT_OTHER): Payer: BC Managed Care – PPO

## 2020-02-12 ENCOUNTER — Encounter (HOSPITAL_COMMUNITY): Payer: Self-pay | Admitting: Emergency Medicine

## 2020-02-12 ENCOUNTER — Emergency Department (HOSPITAL_COMMUNITY)
Admission: EM | Admit: 2020-02-12 | Discharge: 2020-02-12 | Disposition: A | Discharge status: Home / Self Care | Payer: BC Managed Care – PPO | Attending: Emergency Medicine | Admitting: Emergency Medicine

## 2020-02-12 ENCOUNTER — Other Ambulatory Visit: Payer: Self-pay

## 2020-02-12 DIAGNOSIS — M7989 Other specified soft tissue disorders: Secondary | ICD-10-CM

## 2020-02-12 DIAGNOSIS — Z5321 Procedure and treatment not carried out due to patient leaving prior to being seen by health care provider: Secondary | ICD-10-CM | POA: Insufficient documentation

## 2020-02-12 DIAGNOSIS — M79609 Pain in unspecified limb: Secondary | ICD-10-CM

## 2020-02-12 DIAGNOSIS — M79652 Pain in left thigh: Secondary | ICD-10-CM | POA: Insufficient documentation

## 2020-02-12 LAB — BASIC METABOLIC PANEL
Anion gap: 11 (ref 5–15)
BUN: 11 mg/dL (ref 6–20)
CO2: 27 mmol/L (ref 22–32)
Calcium: 8.7 mg/dL — ABNORMAL LOW (ref 8.9–10.3)
Chloride: 101 mmol/L (ref 98–111)
Creatinine, Ser: 0.83 mg/dL (ref 0.61–1.24)
GFR calc Af Amer: >60 mL/min (ref >60)
GFR calc non Af Amer: >60 mL/min (ref >60)
Glucose, Bld: 100 mg/dL — ABNORMAL HIGH (ref 70–99)
Potassium: 4.9 mmol/L (ref 3.5–5.1)
Sodium: 139 mmol/L (ref 135–145)

## 2020-02-12 LAB — CBC
HCT: 42.8 % (ref 39.0–52.0)
Hemoglobin: 13.6 g/dL (ref 13.0–17.0)
MCH: 30.8 pg (ref 26.0–34.0)
MCHC: 31.8 g/dL (ref 30.0–36.0)
MCV: 96.8 fL (ref 80.0–100.0)
Platelets: 123 10*3/uL — ABNORMAL LOW (ref 150–400)
RBC: 4.42 MIL/uL (ref 4.22–5.81)
RDW: 15.1 % (ref 11.5–15.5)
WBC: 5.6 10*3/uL (ref 4.0–10.5)
nRBC: 0 % (ref 0.0–0.2)

## 2020-02-12 NOTE — ED Notes (Signed)
Provider in triage assessing patient.

## 2020-02-12 NOTE — ED Triage Notes (Signed)
Emergency Medicine Provider Triage Evaluation Note  [name] , a 59 y.o. male  was evaluated in triage.  Pt complains of L leg pain. H/o bilat DVTs. Out of eliquis x2wks. (got refill today but has not taken it)  Review of Systems  Positive: L leg pain Negative: Numbness. Fever. Trauma. No CP or SOB  Physical Exam  BP 134/73 (BP Location: Right Arm)   Pulse (!) 56   Temp 98.4 F (36.9 C)   Resp 15   Ht 5\' 5"  (1.651 m)   Wt 68 kg   SpO2 98%   BMI 24.96 kg/m  Gen:   Awake, no distress  HEENT:  Atraumatic  Resp:  Normal effort  Cardiac:  Normal rate  Abd:   Nondistended, nontender  MSK:   Moves extremities without difficulty. L calf swelling and tenderness. eythema of L great toe. Pedal pulses 2+ bilaterally Neuro:  Speech clear  Medical Decision Making  Medically screening exam initiated at 11:14 AM.  Appropriate orders placed.  ANDREI MCCOOK was informed that the remainder of the evaluation will be completed by another provider, this initial triage assessment does not replace that evaluation, and the importance of remaining in the ED until their evaluation is complete.  Clinical Impression   Likely DVT. [place] ordered. Consider infection/gout of L great toe if DVT study negative.    Franchot Heidelberg, PA-C 02/12/20 1116

## 2020-02-12 NOTE — Progress Notes (Signed)
Lower extremity venous bilateral study completed.  Patient returned to ED waiting room.   See CV Proc for preliminary results report.   Darlin Coco, RDMS

## 2020-02-12 NOTE — ED Notes (Signed)
Called for repeat vitals no answer °

## 2020-02-12 NOTE — ED Notes (Signed)
Pt called for recheck vitals without answer

## 2020-02-12 NOTE — ED Triage Notes (Signed)
Onset one day ago developed pain behind left thigh radiating to lower left leg with swelling. States history DVT in bilateral legs. Has not taken Eliquis for two week because he ran out.

## 2020-02-24 DIAGNOSIS — D89811 Chronic graft-versus-host disease: Secondary | ICD-10-CM | POA: Diagnosis not present

## 2020-02-24 DIAGNOSIS — R252 Cramp and spasm: Secondary | ICD-10-CM | POA: Diagnosis not present

## 2020-02-24 DIAGNOSIS — Z9481 Bone marrow transplant status: Secondary | ICD-10-CM | POA: Diagnosis not present

## 2020-02-25 DIAGNOSIS — Z23 Encounter for immunization: Secondary | ICD-10-CM | POA: Diagnosis not present

## 2020-02-25 DIAGNOSIS — Z951 Presence of aortocoronary bypass graft: Secondary | ICD-10-CM | POA: Diagnosis not present

## 2020-02-25 DIAGNOSIS — C9000 Multiple myeloma not having achieved remission: Secondary | ICD-10-CM | POA: Diagnosis not present

## 2020-02-25 DIAGNOSIS — I2510 Atherosclerotic heart disease of native coronary artery without angina pectoris: Secondary | ICD-10-CM | POA: Diagnosis not present

## 2020-02-25 DIAGNOSIS — Z79899 Other long term (current) drug therapy: Secondary | ICD-10-CM | POA: Diagnosis not present

## 2020-02-25 DIAGNOSIS — I351 Nonrheumatic aortic (valve) insufficiency: Secondary | ICD-10-CM | POA: Diagnosis not present

## 2020-03-10 DIAGNOSIS — Z9484 Stem cells transplant status: Secondary | ICD-10-CM | POA: Diagnosis not present

## 2020-03-10 DIAGNOSIS — D89813 Graft-versus-host disease, unspecified: Secondary | ICD-10-CM | POA: Diagnosis not present

## 2020-03-10 DIAGNOSIS — D89811 Chronic graft-versus-host disease: Secondary | ICD-10-CM | POA: Diagnosis not present

## 2020-03-10 DIAGNOSIS — K1370 Unspecified lesions of oral mucosa: Secondary | ICD-10-CM | POA: Diagnosis not present

## 2020-03-10 DIAGNOSIS — L988 Other specified disorders of the skin and subcutaneous tissue: Secondary | ICD-10-CM | POA: Diagnosis not present

## 2020-03-10 DIAGNOSIS — Z9481 Bone marrow transplant status: Secondary | ICD-10-CM | POA: Diagnosis not present

## 2020-03-16 DIAGNOSIS — T865 Complications of stem cell transplant: Secondary | ICD-10-CM | POA: Diagnosis not present

## 2020-03-16 DIAGNOSIS — D89813 Graft-versus-host disease, unspecified: Secondary | ICD-10-CM | POA: Diagnosis not present

## 2020-03-16 DIAGNOSIS — D509 Iron deficiency anemia, unspecified: Secondary | ICD-10-CM | POA: Diagnosis not present

## 2020-03-16 DIAGNOSIS — Z86718 Personal history of other venous thrombosis and embolism: Secondary | ICD-10-CM | POA: Diagnosis not present

## 2020-03-16 DIAGNOSIS — D84821 Immunodeficiency due to drugs: Secondary | ICD-10-CM | POA: Diagnosis not present

## 2020-03-16 DIAGNOSIS — Z9484 Stem cells transplant status: Secondary | ICD-10-CM | POA: Diagnosis not present

## 2020-03-16 DIAGNOSIS — Y834 Other reconstructive surgery as the cause of abnormal reaction of the patient, or of later complication, without mention of misadventure at the time of the procedure: Secondary | ICD-10-CM | POA: Diagnosis not present

## 2020-03-16 DIAGNOSIS — R600 Localized edema: Secondary | ICD-10-CM | POA: Diagnosis not present

## 2020-03-16 DIAGNOSIS — Z951 Presence of aortocoronary bypass graft: Secondary | ICD-10-CM | POA: Diagnosis not present

## 2020-03-16 DIAGNOSIS — M109 Gout, unspecified: Secondary | ICD-10-CM | POA: Diagnosis not present

## 2020-03-16 DIAGNOSIS — Z79899 Other long term (current) drug therapy: Secondary | ICD-10-CM | POA: Diagnosis not present

## 2020-03-16 DIAGNOSIS — C9001 Multiple myeloma in remission: Secondary | ICD-10-CM | POA: Diagnosis not present

## 2020-03-16 DIAGNOSIS — D89811 Chronic graft-versus-host disease: Secondary | ICD-10-CM | POA: Diagnosis not present

## 2020-03-16 DIAGNOSIS — L988 Other specified disorders of the skin and subcutaneous tissue: Secondary | ICD-10-CM | POA: Diagnosis not present

## 2020-03-16 DIAGNOSIS — G629 Polyneuropathy, unspecified: Secondary | ICD-10-CM | POA: Diagnosis not present

## 2020-03-16 DIAGNOSIS — Z9481 Bone marrow transplant status: Secondary | ICD-10-CM | POA: Diagnosis not present

## 2020-03-16 DIAGNOSIS — Z7901 Long term (current) use of anticoagulants: Secondary | ICD-10-CM | POA: Diagnosis not present

## 2020-03-16 DIAGNOSIS — R413 Other amnesia: Secondary | ICD-10-CM | POA: Diagnosis not present

## 2020-03-16 DIAGNOSIS — I2510 Atherosclerotic heart disease of native coronary artery without angina pectoris: Secondary | ICD-10-CM | POA: Diagnosis not present

## 2020-03-17 ENCOUNTER — Telehealth: Payer: Self-pay

## 2020-03-17 NOTE — Telephone Encounter (Signed)
LVM for pt to call back for med check ( testosterone) . [place]

## 2020-03-23 DIAGNOSIS — D89813 Graft-versus-host disease, unspecified: Secondary | ICD-10-CM | POA: Diagnosis not present

## 2020-03-23 DIAGNOSIS — I2510 Atherosclerotic heart disease of native coronary artery without angina pectoris: Secondary | ICD-10-CM | POA: Diagnosis not present

## 2020-03-23 DIAGNOSIS — D89811 Chronic graft-versus-host disease: Secondary | ICD-10-CM | POA: Diagnosis not present

## 2020-03-23 DIAGNOSIS — R140 Abdominal distension (gaseous): Secondary | ICD-10-CM | POA: Diagnosis not present

## 2020-03-23 DIAGNOSIS — Z9484 Stem cells transplant status: Secondary | ICD-10-CM | POA: Diagnosis not present

## 2020-03-23 DIAGNOSIS — L988 Other specified disorders of the skin and subcutaneous tissue: Secondary | ICD-10-CM | POA: Diagnosis not present

## 2020-03-23 DIAGNOSIS — Z9481 Bone marrow transplant status: Secondary | ICD-10-CM | POA: Diagnosis not present

## 2020-03-23 DIAGNOSIS — R413 Other amnesia: Secondary | ICD-10-CM | POA: Diagnosis not present

## 2020-03-28 DIAGNOSIS — R140 Abdominal distension (gaseous): Secondary | ICD-10-CM | POA: Diagnosis not present

## 2020-03-28 DIAGNOSIS — Z9484 Stem cells transplant status: Secondary | ICD-10-CM | POA: Diagnosis not present

## 2020-03-28 DIAGNOSIS — I2510 Atherosclerotic heart disease of native coronary artery without angina pectoris: Secondary | ICD-10-CM | POA: Diagnosis not present

## 2020-03-28 DIAGNOSIS — D89811 Chronic graft-versus-host disease: Secondary | ICD-10-CM | POA: Diagnosis not present

## 2020-03-28 DIAGNOSIS — Z9481 Bone marrow transplant status: Secondary | ICD-10-CM | POA: Diagnosis not present

## 2020-03-30 DIAGNOSIS — D89811 Chronic graft-versus-host disease: Secondary | ICD-10-CM | POA: Diagnosis not present

## 2020-03-30 DIAGNOSIS — C9000 Multiple myeloma not having achieved remission: Secondary | ICD-10-CM | POA: Diagnosis not present

## 2020-03-30 DIAGNOSIS — W208XXA Other cause of strike by thrown, projected or falling object, initial encounter: Secondary | ICD-10-CM | POA: Diagnosis not present

## 2020-03-30 DIAGNOSIS — Z9481 Bone marrow transplant status: Secondary | ICD-10-CM | POA: Diagnosis not present

## 2020-03-30 DIAGNOSIS — L03116 Cellulitis of left lower limb: Secondary | ICD-10-CM | POA: Diagnosis not present

## 2020-03-30 DIAGNOSIS — S8292XA Unspecified fracture of left lower leg, initial encounter for closed fracture: Secondary | ICD-10-CM | POA: Diagnosis not present

## 2020-03-30 DIAGNOSIS — Y990 Civilian activity done for income or pay: Secondary | ICD-10-CM | POA: Diagnosis not present

## 2020-03-30 DIAGNOSIS — D84821 Immunodeficiency due to drugs: Secondary | ICD-10-CM | POA: Diagnosis not present

## 2020-03-30 DIAGNOSIS — M7989 Other specified soft tissue disorders: Secondary | ICD-10-CM | POA: Diagnosis not present

## 2020-03-30 DIAGNOSIS — Z79899 Other long term (current) drug therapy: Secondary | ICD-10-CM | POA: Diagnosis not present

## 2020-03-30 DIAGNOSIS — M109 Gout, unspecified: Secondary | ICD-10-CM | POA: Diagnosis not present

## 2020-03-30 DIAGNOSIS — D509 Iron deficiency anemia, unspecified: Secondary | ICD-10-CM | POA: Diagnosis not present

## 2020-03-30 DIAGNOSIS — L988 Other specified disorders of the skin and subcutaneous tissue: Secondary | ICD-10-CM | POA: Diagnosis not present

## 2020-03-30 DIAGNOSIS — R739 Hyperglycemia, unspecified: Secondary | ICD-10-CM | POA: Diagnosis not present

## 2020-03-30 DIAGNOSIS — Z951 Presence of aortocoronary bypass graft: Secondary | ICD-10-CM | POA: Diagnosis not present

## 2020-03-30 DIAGNOSIS — R609 Edema, unspecified: Secondary | ICD-10-CM | POA: Diagnosis not present

## 2020-03-30 DIAGNOSIS — Z5181 Encounter for therapeutic drug level monitoring: Secondary | ICD-10-CM | POA: Diagnosis not present

## 2020-03-30 DIAGNOSIS — T380X5A Adverse effect of glucocorticoids and synthetic analogues, initial encounter: Secondary | ICD-10-CM | POA: Diagnosis not present

## 2020-03-30 DIAGNOSIS — G629 Polyneuropathy, unspecified: Secondary | ICD-10-CM | POA: Diagnosis not present

## 2020-03-30 DIAGNOSIS — I2510 Atherosclerotic heart disease of native coronary artery without angina pectoris: Secondary | ICD-10-CM | POA: Diagnosis not present

## 2020-03-30 DIAGNOSIS — T865 Complications of stem cell transplant: Secondary | ICD-10-CM | POA: Diagnosis not present

## 2020-04-02 DIAGNOSIS — L089 Local infection of the skin and subcutaneous tissue, unspecified: Secondary | ICD-10-CM | POA: Diagnosis not present

## 2020-04-02 DIAGNOSIS — C9000 Multiple myeloma not having achieved remission: Secondary | ICD-10-CM | POA: Diagnosis not present

## 2020-04-02 DIAGNOSIS — Z20822 Contact with and (suspected) exposure to covid-19: Secondary | ICD-10-CM | POA: Diagnosis not present

## 2020-04-02 DIAGNOSIS — Z9481 Bone marrow transplant status: Secondary | ICD-10-CM | POA: Diagnosis not present

## 2020-04-03 DIAGNOSIS — Z9481 Bone marrow transplant status: Secondary | ICD-10-CM | POA: Diagnosis not present

## 2020-04-03 DIAGNOSIS — L089 Local infection of the skin and subcutaneous tissue, unspecified: Secondary | ICD-10-CM | POA: Diagnosis not present

## 2020-04-03 DIAGNOSIS — C9000 Multiple myeloma not having achieved remission: Secondary | ICD-10-CM | POA: Diagnosis not present

## 2020-04-04 DIAGNOSIS — C9000 Multiple myeloma not having achieved remission: Secondary | ICD-10-CM | POA: Diagnosis not present

## 2020-04-04 DIAGNOSIS — L089 Local infection of the skin and subcutaneous tissue, unspecified: Secondary | ICD-10-CM | POA: Diagnosis not present

## 2020-04-04 DIAGNOSIS — R600 Localized edema: Secondary | ICD-10-CM | POA: Diagnosis not present

## 2020-04-04 DIAGNOSIS — Z9484 Stem cells transplant status: Secondary | ICD-10-CM | POA: Diagnosis not present

## 2020-04-04 DIAGNOSIS — Z9481 Bone marrow transplant status: Secondary | ICD-10-CM | POA: Diagnosis not present

## 2020-04-04 DIAGNOSIS — L539 Erythematous condition, unspecified: Secondary | ICD-10-CM | POA: Diagnosis not present

## 2020-04-04 DIAGNOSIS — D89811 Chronic graft-versus-host disease: Secondary | ICD-10-CM | POA: Diagnosis not present

## 2020-04-04 DIAGNOSIS — X58XXXD Exposure to other specified factors, subsequent encounter: Secondary | ICD-10-CM | POA: Diagnosis not present

## 2020-04-04 DIAGNOSIS — L03116 Cellulitis of left lower limb: Secondary | ICD-10-CM | POA: Diagnosis not present

## 2020-04-04 DIAGNOSIS — S81812D Laceration without foreign body, left lower leg, subsequent encounter: Secondary | ICD-10-CM | POA: Diagnosis not present

## 2020-04-05 DIAGNOSIS — W208XXD Other cause of strike by thrown, projected or falling object, subsequent encounter: Secondary | ICD-10-CM | POA: Diagnosis not present

## 2020-04-05 DIAGNOSIS — C9000 Multiple myeloma not having achieved remission: Secondary | ICD-10-CM | POA: Diagnosis not present

## 2020-04-05 DIAGNOSIS — X58XXXA Exposure to other specified factors, initial encounter: Secondary | ICD-10-CM | POA: Diagnosis not present

## 2020-04-05 DIAGNOSIS — L089 Local infection of the skin and subcutaneous tissue, unspecified: Secondary | ICD-10-CM | POA: Diagnosis not present

## 2020-04-05 DIAGNOSIS — Z9484 Stem cells transplant status: Secondary | ICD-10-CM | POA: Diagnosis not present

## 2020-04-05 DIAGNOSIS — S81812D Laceration without foreign body, left lower leg, subsequent encounter: Secondary | ICD-10-CM | POA: Diagnosis not present

## 2020-04-06 ENCOUNTER — Encounter: Payer: BC Managed Care – PPO | Admitting: Family Medicine

## 2020-04-06 DIAGNOSIS — L089 Local infection of the skin and subcutaneous tissue, unspecified: Secondary | ICD-10-CM | POA: Diagnosis not present

## 2020-04-06 DIAGNOSIS — Z9481 Bone marrow transplant status: Secondary | ICD-10-CM | POA: Diagnosis not present

## 2020-04-06 DIAGNOSIS — C9000 Multiple myeloma not having achieved remission: Secondary | ICD-10-CM | POA: Diagnosis not present

## 2020-04-07 DIAGNOSIS — Z9481 Bone marrow transplant status: Secondary | ICD-10-CM | POA: Diagnosis not present

## 2020-04-07 DIAGNOSIS — L089 Local infection of the skin and subcutaneous tissue, unspecified: Secondary | ICD-10-CM | POA: Diagnosis not present

## 2020-04-07 DIAGNOSIS — L03116 Cellulitis of left lower limb: Secondary | ICD-10-CM | POA: Diagnosis not present

## 2020-04-07 DIAGNOSIS — C9001 Multiple myeloma in remission: Secondary | ICD-10-CM | POA: Diagnosis not present

## 2020-04-07 DIAGNOSIS — C9000 Multiple myeloma not having achieved remission: Secondary | ICD-10-CM | POA: Diagnosis not present

## 2020-04-08 DIAGNOSIS — C9000 Multiple myeloma not having achieved remission: Secondary | ICD-10-CM | POA: Diagnosis not present

## 2020-04-08 DIAGNOSIS — L089 Local infection of the skin and subcutaneous tissue, unspecified: Secondary | ICD-10-CM | POA: Diagnosis not present

## 2020-04-08 DIAGNOSIS — Z9481 Bone marrow transplant status: Secondary | ICD-10-CM | POA: Diagnosis not present

## 2020-04-09 DIAGNOSIS — C9000 Multiple myeloma not having achieved remission: Secondary | ICD-10-CM | POA: Diagnosis not present

## 2020-04-09 DIAGNOSIS — Z9481 Bone marrow transplant status: Secondary | ICD-10-CM | POA: Diagnosis not present

## 2020-04-09 DIAGNOSIS — L089 Local infection of the skin and subcutaneous tissue, unspecified: Secondary | ICD-10-CM | POA: Diagnosis not present

## 2020-04-10 DIAGNOSIS — Z9481 Bone marrow transplant status: Secondary | ICD-10-CM | POA: Diagnosis not present

## 2020-04-10 DIAGNOSIS — C9000 Multiple myeloma not having achieved remission: Secondary | ICD-10-CM | POA: Diagnosis not present

## 2020-04-10 DIAGNOSIS — L089 Local infection of the skin and subcutaneous tissue, unspecified: Secondary | ICD-10-CM | POA: Diagnosis not present

## 2020-04-11 DIAGNOSIS — Z9481 Bone marrow transplant status: Secondary | ICD-10-CM | POA: Diagnosis not present

## 2020-04-11 DIAGNOSIS — L089 Local infection of the skin and subcutaneous tissue, unspecified: Secondary | ICD-10-CM | POA: Diagnosis not present

## 2020-04-11 DIAGNOSIS — C9000 Multiple myeloma not having achieved remission: Secondary | ICD-10-CM | POA: Diagnosis not present

## 2020-04-12 DIAGNOSIS — L089 Local infection of the skin and subcutaneous tissue, unspecified: Secondary | ICD-10-CM | POA: Diagnosis not present

## 2020-04-12 DIAGNOSIS — C9000 Multiple myeloma not having achieved remission: Secondary | ICD-10-CM | POA: Diagnosis not present

## 2020-04-12 DIAGNOSIS — Z9481 Bone marrow transplant status: Secondary | ICD-10-CM | POA: Diagnosis not present

## 2020-04-13 DIAGNOSIS — Z9481 Bone marrow transplant status: Secondary | ICD-10-CM | POA: Diagnosis not present

## 2020-04-13 DIAGNOSIS — D89811 Chronic graft-versus-host disease: Secondary | ICD-10-CM | POA: Diagnosis not present

## 2020-04-13 DIAGNOSIS — L03116 Cellulitis of left lower limb: Secondary | ICD-10-CM | POA: Diagnosis not present

## 2020-04-13 DIAGNOSIS — L089 Local infection of the skin and subcutaneous tissue, unspecified: Secondary | ICD-10-CM | POA: Diagnosis not present

## 2020-04-13 DIAGNOSIS — C9000 Multiple myeloma not having achieved remission: Secondary | ICD-10-CM | POA: Diagnosis not present

## 2020-04-14 DIAGNOSIS — Z9481 Bone marrow transplant status: Secondary | ICD-10-CM | POA: Diagnosis not present

## 2020-04-14 DIAGNOSIS — C9000 Multiple myeloma not having achieved remission: Secondary | ICD-10-CM | POA: Diagnosis not present

## 2020-04-14 DIAGNOSIS — L089 Local infection of the skin and subcutaneous tissue, unspecified: Secondary | ICD-10-CM | POA: Diagnosis not present

## 2020-04-15 DIAGNOSIS — Z9481 Bone marrow transplant status: Secondary | ICD-10-CM | POA: Diagnosis not present

## 2020-04-15 DIAGNOSIS — C9000 Multiple myeloma not having achieved remission: Secondary | ICD-10-CM | POA: Diagnosis not present

## 2020-04-15 DIAGNOSIS — L089 Local infection of the skin and subcutaneous tissue, unspecified: Secondary | ICD-10-CM | POA: Diagnosis not present

## 2020-05-04 DIAGNOSIS — L03116 Cellulitis of left lower limb: Secondary | ICD-10-CM | POA: Diagnosis not present

## 2020-05-04 DIAGNOSIS — C9001 Multiple myeloma in remission: Secondary | ICD-10-CM | POA: Diagnosis not present

## 2020-05-04 DIAGNOSIS — C9000 Multiple myeloma not having achieved remission: Secondary | ICD-10-CM | POA: Diagnosis not present

## 2020-05-04 DIAGNOSIS — L988 Other specified disorders of the skin and subcutaneous tissue: Secondary | ICD-10-CM | POA: Diagnosis not present

## 2020-05-04 DIAGNOSIS — Z9481 Bone marrow transplant status: Secondary | ICD-10-CM | POA: Diagnosis not present

## 2020-05-04 DIAGNOSIS — D89811 Chronic graft-versus-host disease: Secondary | ICD-10-CM | POA: Diagnosis not present

## 2020-05-04 DIAGNOSIS — T451X5A Adverse effect of antineoplastic and immunosuppressive drugs, initial encounter: Secondary | ICD-10-CM | POA: Diagnosis not present

## 2020-05-04 DIAGNOSIS — R21 Rash and other nonspecific skin eruption: Secondary | ICD-10-CM | POA: Diagnosis not present

## 2020-05-04 DIAGNOSIS — Z5181 Encounter for therapeutic drug level monitoring: Secondary | ICD-10-CM | POA: Diagnosis not present

## 2020-05-04 DIAGNOSIS — Z9484 Stem cells transplant status: Secondary | ICD-10-CM | POA: Diagnosis not present

## 2020-05-04 DIAGNOSIS — D89813 Graft-versus-host disease, unspecified: Secondary | ICD-10-CM | POA: Diagnosis not present

## 2020-05-04 DIAGNOSIS — G620 Drug-induced polyneuropathy: Secondary | ICD-10-CM | POA: Diagnosis not present

## 2020-05-11 DIAGNOSIS — D89811 Chronic graft-versus-host disease: Secondary | ICD-10-CM | POA: Diagnosis not present

## 2020-05-11 DIAGNOSIS — T451X5A Adverse effect of antineoplastic and immunosuppressive drugs, initial encounter: Secondary | ICD-10-CM | POA: Diagnosis not present

## 2020-05-11 DIAGNOSIS — S81812D Laceration without foreign body, left lower leg, subsequent encounter: Secondary | ICD-10-CM | POA: Diagnosis not present

## 2020-05-11 DIAGNOSIS — M109 Gout, unspecified: Secondary | ICD-10-CM | POA: Diagnosis not present

## 2020-05-11 DIAGNOSIS — R197 Diarrhea, unspecified: Secondary | ICD-10-CM | POA: Diagnosis not present

## 2020-05-11 DIAGNOSIS — D509 Iron deficiency anemia, unspecified: Secondary | ICD-10-CM | POA: Diagnosis not present

## 2020-05-11 DIAGNOSIS — K521 Toxic gastroenteritis and colitis: Secondary | ICD-10-CM | POA: Diagnosis not present

## 2020-05-11 DIAGNOSIS — Z9484 Stem cells transplant status: Secondary | ICD-10-CM | POA: Diagnosis not present

## 2020-05-11 DIAGNOSIS — R600 Localized edema: Secondary | ICD-10-CM | POA: Diagnosis not present

## 2020-05-11 DIAGNOSIS — L03116 Cellulitis of left lower limb: Secondary | ICD-10-CM | POA: Diagnosis not present

## 2020-05-11 DIAGNOSIS — S81812S Laceration without foreign body, left lower leg, sequela: Secondary | ICD-10-CM | POA: Diagnosis not present

## 2020-05-11 DIAGNOSIS — M8610 Other acute osteomyelitis, unspecified site: Secondary | ICD-10-CM | POA: Diagnosis not present

## 2020-05-11 DIAGNOSIS — K219 Gastro-esophageal reflux disease without esophagitis: Secondary | ICD-10-CM | POA: Diagnosis not present

## 2020-05-11 DIAGNOSIS — I10 Essential (primary) hypertension: Secondary | ICD-10-CM | POA: Diagnosis not present

## 2020-05-11 DIAGNOSIS — Z9481 Bone marrow transplant status: Secondary | ICD-10-CM | POA: Diagnosis not present

## 2020-05-11 DIAGNOSIS — C9000 Multiple myeloma not having achieved remission: Secondary | ICD-10-CM | POA: Diagnosis not present

## 2020-05-11 DIAGNOSIS — H409 Unspecified glaucoma: Secondary | ICD-10-CM | POA: Diagnosis not present

## 2020-05-11 DIAGNOSIS — D849 Immunodeficiency, unspecified: Secondary | ICD-10-CM | POA: Diagnosis not present

## 2020-05-11 DIAGNOSIS — Z792 Long term (current) use of antibiotics: Secondary | ICD-10-CM | POA: Diagnosis not present

## 2020-05-11 DIAGNOSIS — E876 Hypokalemia: Secondary | ICD-10-CM | POA: Diagnosis not present

## 2020-05-23 DIAGNOSIS — D89811 Chronic graft-versus-host disease: Secondary | ICD-10-CM | POA: Diagnosis not present

## 2020-05-23 DIAGNOSIS — L03116 Cellulitis of left lower limb: Secondary | ICD-10-CM | POA: Diagnosis not present

## 2020-05-23 DIAGNOSIS — L089 Local infection of the skin and subcutaneous tissue, unspecified: Secondary | ICD-10-CM | POA: Diagnosis not present

## 2020-05-23 DIAGNOSIS — C9001 Multiple myeloma in remission: Secondary | ICD-10-CM | POA: Diagnosis not present

## 2020-05-23 DIAGNOSIS — Z9481 Bone marrow transplant status: Secondary | ICD-10-CM | POA: Diagnosis not present

## 2020-05-30 DIAGNOSIS — D89811 Chronic graft-versus-host disease: Secondary | ICD-10-CM | POA: Diagnosis not present

## 2020-05-30 DIAGNOSIS — Z9481 Bone marrow transplant status: Secondary | ICD-10-CM | POA: Diagnosis not present

## 2020-05-30 DIAGNOSIS — L089 Local infection of the skin and subcutaneous tissue, unspecified: Secondary | ICD-10-CM | POA: Diagnosis not present

## 2020-05-30 DIAGNOSIS — D891 Cryoglobulinemia: Secondary | ICD-10-CM | POA: Diagnosis not present

## 2020-05-30 DIAGNOSIS — L03116 Cellulitis of left lower limb: Secondary | ICD-10-CM | POA: Diagnosis not present

## 2020-06-01 DIAGNOSIS — M109 Gout, unspecified: Secondary | ICD-10-CM | POA: Diagnosis not present

## 2020-06-01 DIAGNOSIS — L03116 Cellulitis of left lower limb: Secondary | ICD-10-CM | POA: Diagnosis not present

## 2020-06-01 DIAGNOSIS — Z792 Long term (current) use of antibiotics: Secondary | ICD-10-CM | POA: Diagnosis not present

## 2020-06-01 DIAGNOSIS — D89811 Chronic graft-versus-host disease: Secondary | ICD-10-CM | POA: Diagnosis not present

## 2020-06-01 DIAGNOSIS — R413 Other amnesia: Secondary | ICD-10-CM | POA: Diagnosis not present

## 2020-06-01 DIAGNOSIS — C9000 Multiple myeloma not having achieved remission: Secondary | ICD-10-CM | POA: Diagnosis not present

## 2020-06-01 DIAGNOSIS — Z9484 Stem cells transplant status: Secondary | ICD-10-CM | POA: Diagnosis not present

## 2020-06-01 DIAGNOSIS — D84821 Immunodeficiency due to drugs: Secondary | ICD-10-CM | POA: Diagnosis not present

## 2020-06-01 DIAGNOSIS — L089 Local infection of the skin and subcutaneous tissue, unspecified: Secondary | ICD-10-CM | POA: Diagnosis not present

## 2020-06-01 DIAGNOSIS — R739 Hyperglycemia, unspecified: Secondary | ICD-10-CM | POA: Diagnosis not present

## 2020-06-01 DIAGNOSIS — Z79899 Other long term (current) drug therapy: Secondary | ICD-10-CM | POA: Diagnosis not present

## 2020-06-01 DIAGNOSIS — K30 Functional dyspepsia: Secondary | ICD-10-CM | POA: Diagnosis not present

## 2020-06-01 DIAGNOSIS — S81812A Laceration without foreign body, left lower leg, initial encounter: Secondary | ICD-10-CM | POA: Diagnosis not present

## 2020-06-01 DIAGNOSIS — I341 Nonrheumatic mitral (valve) prolapse: Secondary | ICD-10-CM | POA: Diagnosis not present

## 2020-06-01 DIAGNOSIS — D509 Iron deficiency anemia, unspecified: Secondary | ICD-10-CM | POA: Diagnosis not present

## 2020-06-01 DIAGNOSIS — T865 Complications of stem cell transplant: Secondary | ICD-10-CM | POA: Diagnosis not present

## 2020-06-01 DIAGNOSIS — I2510 Atherosclerotic heart disease of native coronary artery without angina pectoris: Secondary | ICD-10-CM | POA: Diagnosis not present

## 2020-06-01 DIAGNOSIS — G629 Polyneuropathy, unspecified: Secondary | ICD-10-CM | POA: Diagnosis not present

## 2020-06-01 DIAGNOSIS — M349 Systemic sclerosis, unspecified: Secondary | ICD-10-CM | POA: Diagnosis not present

## 2020-06-01 DIAGNOSIS — Z9481 Bone marrow transplant status: Secondary | ICD-10-CM | POA: Diagnosis not present

## 2020-06-10 DIAGNOSIS — L97921 Non-pressure chronic ulcer of unspecified part of left lower leg limited to breakdown of skin: Secondary | ICD-10-CM | POA: Diagnosis not present

## 2020-06-10 DIAGNOSIS — R600 Localized edema: Secondary | ICD-10-CM | POA: Diagnosis not present

## 2020-06-10 DIAGNOSIS — I872 Venous insufficiency (chronic) (peripheral): Secondary | ICD-10-CM | POA: Diagnosis not present

## 2020-06-10 DIAGNOSIS — I890 Lymphedema, not elsewhere classified: Secondary | ICD-10-CM | POA: Diagnosis not present

## 2020-06-10 DIAGNOSIS — D84821 Immunodeficiency due to drugs: Secondary | ICD-10-CM | POA: Diagnosis not present

## 2020-07-11 DIAGNOSIS — L570 Actinic keratosis: Secondary | ICD-10-CM | POA: Diagnosis not present

## 2020-07-30 ENCOUNTER — Encounter: Payer: Self-pay | Admitting: *Deleted

## 2020-07-30 ENCOUNTER — Ambulatory Visit
Admission: EM | Admit: 2020-07-30 | Discharge: 2020-07-30 | Disposition: A | Discharge status: Home / Self Care | Payer: BC Managed Care – PPO

## 2020-07-30 ENCOUNTER — Other Ambulatory Visit: Payer: Self-pay

## 2020-07-30 DIAGNOSIS — S81802A Unspecified open wound, left lower leg, initial encounter: Secondary | ICD-10-CM

## 2020-07-30 HISTORY — DX: Graft-versus-host disease, unspecified: D89.813

## 2020-07-30 MED ORDER — MUPIROCIN 2 % EX OINT: 1.0000 "application " | TOPICAL_OINTMENT | Freq: Two times a day (BID) | CUTANEOUS | 0 refills | Status: DC

## 2020-07-30 NOTE — Discharge Instructions (Addendum)
Please use Bactroban to area of open wounds to help prevent infection and provide extra barrier to prevent dressings from sticking to skin Please use nonadherent dressings, petroleum gauze to minimize skin tearing Follow-up with wound clinic [facility] Please follow-up if any symptoms not improving or worsening, developing increased redness pain swelling drainage

## 2020-07-30 NOTE — ED Triage Notes (Signed)
C/O recurrent wounds to skin of left shin area, originally from traumatic wound 02/2020.  States used to have wound care through [facility].  Reports starting with few superficial ulcers to area few days ago.  Has been cleaning with H2O2 and polysporin.  STates wrapping wounds "makes it worse".

## 2020-07-30 NOTE — ED Provider Notes (Signed)
[facility]    CSN: [id number] Arrival date & time: 07/30/20  0856      History   Chief Complaint Chief Complaint  Patient presents with  . Wound Check    HPI [name] is a 60 y.o. male history of hypertension, CAD, hyperlipidemia, multiple myeloma, presenting today for evaluation of leg wound.  Reports approximately 4 to 5 months ago developed wound to left anterior leg from falling on asphalt.  Since he has been following up with [facility] wound clinic.  Reports overall improving, but recently has been having some areas that are blistering and open up and draining fluid.  Reports he has been using hydrogen peroxide but feels when he wraps his wounds that pulls the skin is off.  Denies any pustular drainage.  Denies any increased pain or redness to the wounds of recently.  Does not like having to drive to [facility].  HPI  Past Medical History:  Diagnosis Date  . Anxiety   . Aortic valve insufficiency, senile calcific October 14   Moderate regurgitation, eccentric towards Ant MV Leaflet  . Bence Jones proteinuria 09/01/2012   Borderline increase protein 190 mg on 24 hr urine IFE with free kappa light chains (lab normal 50-100 mg)  08/18/12  . CAD in native artery    s/p CABG; -- Cards: Dr. Roni Bread, [facility]; [facility] 01/2013 EF 57%, No ischemia or Infarction.  . Erectile dysfunction   . Essential hypertension   . 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 H/O cardiovascular stress test 02/22/10   normal perfusion, no ischemia or infarct; treadmill and Myoview perfusion study; Dr. Elisabeth Cara  . Hyperlipidemia LDL goal <70   . Leukopenia 09/01/2012   WBC 3,700 42 poly, 48 lymphs, 9 monos 08/18/12  Was 3,700 1 year ago  . 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 Moderate mitral regurgitation by prior echocardiogram October 2014   Echo 02/2013: Severe holosystolic Ant MV Leaflet prolapse, Moderate regurgitation.  Mildly dilated LV  with normal systolic function (EF 51-02%), moderate LA dilation, upper normal PA pressures ~ 34 mmHg.  . Monoclonal gammopathy 09/01/2012   Increased total protein, increased beta globulin peak with decreased gammaglobulin peak on SPEP 08/18/12  IFE not done "possible faint abnormal band"  . Multiple myeloma, without mention of having achieved remission 10/14/2012   Stage I Durie-Salmon; good prognosis international scoring system: see 10/13/12 progress note --> ? in Remission as of 06/19/2013; ? Failed Auto BMT; Continues to be on Chemo  with planned Donor BMT  . Normochromic normocytic anemia 09/01/2012   Hb 12.7, MCV 95  08/18/12 was 13.7 1 year ago; concomitant leukopenia  & increased lymphs  . Pneumococcal vaccine refused 08/15/2012    Patient Active Problem List   Diagnosis Date Noted  . S/P mitral valve clip implantation 10/09/2019  . 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 Dyslipidemia, goal LDL below 70 04/03/2014  . Aortic valve insufficiency, senile calcific   . Multiple myeloma in remission ([place]) 06/19/2013  . Moderate mitral regurgitation by prior echocardiogram 02/25/2013  . Mitral valve anterior leaflet prolapse 02/25/2013  . Hx of valvular heart disease - MVP with midl-mod MR, Mild AI with Aortic Sclerosis 02/16/2013  . CAD in native artery -- s/p CABG 02/16/2013  . Monoclonal gammopathy 09/01/2012  . Normochromic normocytic anemia 09/01/2012  . Leukopenia 09/01/2012  . Hx of CABG 08/02/2011  . Hyperlipidemia LDL goal <  70 08/02/2011  . Allergic rhinitis, mild 08/02/2011  . Arthralgia of hand 10/11/2010  . Back pain 10/11/2010  . Fatigue 10/11/2010  . ED (erectile dysfunction) 10/11/2010    Past Surgical History:  Procedure Laterality Date  . CARDIAC SURGERY    . COLONOSCOPY     ?  . CORONARY ARTERY BYPASS GRAFT  10/2003   LIMA to LAD, RIMA to OM1, sequential to OM2; Dr. Elisabeth Cara  . MITRAL VALVE SURGERY         Home Medications    Prior to Admission  medications   Medication Sig Start Date End Date Taking? Authorizing Provider  acyclovir (ZOVIRAX) 400 MG tablet TAKE ONE TABLET BY MOUTH TWICE DAILY   Yes Granfortuna, Alyson Locket, MD  Apixaban (ELIQUIS PO) Take 1 tablet by mouth 2 (two) times daily.   Yes [provider]  cholecalciferol (VITAMIN D) 1000 units tablet Take 1,000 Units by mouth once a week.   Yes [provider]  dorzolamide-timolol (COSOPT) 22.3-6.8 MG/ML ophthalmic solution 1 drop 2 (two) times daily.   Yes [provider]  ezetimibe-simvastatin (VYTORIN) 10-20 MG tablet Take 1 tablet by mouth daily.   Yes [provider]  lisinopril (PRINIVIL,ZESTRIL) 2.5 MG tablet Take 2.5 mg by mouth daily.   Yes [provider]  mupirocin ointment (BACTROBAN) 2 % Apply 1 application topically 2 (two) times daily. 07/30/20  Yes Bayyinah Dukeman C, PA-C  omeprazole (PRILOSEC) 40 MG capsule Take by mouth daily. 11/10/18  Yes [provider]  ondansetron (ZOFRAN-ODT) 8 MG disintegrating tablet DISSOLVE 1 TABLET IN MOUTH EVERY 8 HOURS AS NEEDED FOR NAUSEA FOR UP TO 3 DAYS 11/02/18  Yes [provider]  oxyCODONE (OXY IR/ROXICODONE) 5 MG immediate release tablet Take by mouth. 03/26/18  Yes [provider]  POSACONAZOLE PO Take by mouth.   Yes [provider]  pregabalin (LYRICA) 100 MG capsule Take 100 mg by mouth 2 (two) times daily.   Yes [provider]  Ruxolitinib Phosphate (JAKAFI PO) Take by mouth.   Yes [provider]  sildenafil (REVATIO) 20 MG tablet Take 1 to 5 pills as needed 11/29/16  Yes Denita Lung, MD  TACROLIMUS PO Take by mouth.   Yes [provider]  Vitamin D, Ergocalciferol, (DRISDOL) 1.25 MG (50000 UT) CAPS capsule Take by mouth. 11/06/18  Yes [provider]  albuterol (VENTOLIN HFA) 108 (90 Base) MCG/ACT inhaler Inhale 2 puffs into the lungs every 4 (four) hours as needed for wheezing or shortness of breath (or  cough). 11/21/19   Tasia Catchings, Amy V, PA-C  benzonatate (TESSALON) 200 MG capsule Take 1 capsule (200 mg total) by mouth every 8 (eight) hours. 01/18/20   Loura Halt A, NP  betamethasone valerate ointment (VALISONE) 0.1 % Apply 1 application topically 2 (two) times daily. 11/27/19   Hall-Potvin, [place], PA-C  DULoxetine (CYMBALTA) 30 MG capsule Take 30 mg by mouth daily.    [provider]  HYDROcodone-homatropine (HYCODAN) 5-1.5 MG/5ML syrup Take 5 mLs by mouth every 6 (six) hours as needed for cough. 01/18/20   Loura Halt A, NP  allopurinol (ZYLOPRIM) 100 MG tablet Take 100 mg by mouth daily.  11/21/19  [provider]  amLODipine (NORVASC) 5 MG tablet Take 5 mg by mouth daily.  11/21/19  [provider]  azelastine (ASTELIN) 0.1 % nasal spray Place 2 sprays into both nostrils 2 (two) times daily. Use in each nostril as directed Patient not taking: Reported on 09/14/2019 01/03/16  11/21/19  Denita Lung, MD  mometasone (NASONEX) 50 MCG/ACT nasal spray Place 2 sprays into the nose as needed. 09/25/12 11/21/19  Denita Lung, MD    Family History Family History  Problem Relation Age of Onset  . Heart disease Father   . Hypertension Father   . Heart failure Father   . Heart attack Brother        at 33  . Heart attack Brother        at 62  . Healthy Mother     Social History Social History   Tobacco Use  . Smoking status: Never Smoker  . Smokeless tobacco: Never Used  Vaping Use  . Vaping Use: Never used  Substance Use Topics  . Alcohol use: No  . Drug use: No     Allergies   Atorvastatin, Chlorhexidine gluconate, and Toprol xl [metoprolol tartrate]   Review of Systems Review of Systems  Constitutional: Negative for fatigue and fever.  Eyes: Negative for redness, itching and visual disturbance.  Respiratory: Negative for shortness of breath.   Cardiovascular: Negative for chest pain and leg swelling.  Gastrointestinal: Negative for nausea and vomiting.   Musculoskeletal: Negative for arthralgias and myalgias.  Skin: Positive for color change and wound. Negative for rash.  Neurological: Negative for dizziness, syncope, weakness, light-headedness and headaches.     Physical Exam Triage Vital Signs ED Triage Vitals  Enc Vitals Group     BP      Pulse      Resp      Temp      Temp src      SpO2      Weight      Height      Head Circumference      Peak Flow      Pain Score      Pain Loc      Pain Edu?      Excl. in [place]?    No data found.  Updated Vital Signs BP 132/76   Pulse (!) 104   Temp 98 F (36.7 C) (Temporal)   Resp 18   SpO2 96%   Visual Acuity Right Eye Distance:   Left Eye Distance:   Bilateral Distance:    Right Eye Near:   Left Eye Near:    Bilateral Near:     Physical Exam Vitals and nursing note reviewed.  Constitutional:      Appearance: He is well-developed and well-nourished.     Comments: No acute distress  HENT:     Head: Normocephalic and atraumatic.     Nose: Nose normal.  Eyes:     Conjunctiva/sclera: Conjunctivae normal.  Cardiovascular:     Rate and Rhythm: Normal rate.  Pulmonary:     Effort: Pulmonary effort is normal. No respiratory distress.  Abdominal:     General: There is no distension.  Musculoskeletal:        General: Normal range of motion.     Cervical back: Neck supple.  Skin:    General: Skin is warm and dry.     Comments: Left anterior leg with area of hyperpigmentation and slight erythema with scar tissue, area more medially with opened blister draining clear fluid, lateral aspect of leg with scab areas, no significant erythema or warmth noted around area/wounds  Neurological:     Mental Status: He is alert and oriented to person, place, and time.  Psychiatric:        Mood and Affect:  Mood and affect normal.      UC Treatments / Results  Labs (all labs ordered are listed, but only abnormal results are displayed) Labs Reviewed - No data to  display  EKG   Radiology No results found.  Procedures Procedures (including critical care time)  Medications Ordered in UC Medications - No data to display  Initial Impression / Assessment and Plan / UC Course  I have reviewed the triage vital signs and the nursing notes.  Pertinent labs & imaging results that were available during my care of the patient were reviewed by me and considered in my medical decision making (see chart for details).     Wound appears to be scarring/healing, no signs of current cellulitis or infection, discussed wound care, applying Xeroform dressing and discussed nonadherent dressing and Bactroban topically.  Referral to wound care closer to [facility] provided for follow-up to ensure area continuing to heal without complication.  Discussed strict return precautions. Patient verbalized understanding and is agreeable with plan.  Final Clinical Impressions(s) / UC Diagnoses   Final diagnoses:  Open leg wound, left, initial encounter     Discharge Instructions     Please use Bactroban to area of open wounds to help prevent infection and provide extra barrier to prevent dressings from sticking to skin Please use nonadherent dressings, petroleum gauze to minimize skin tearing Follow-up with wound clinic [facility] Please follow-up if any symptoms not improving or worsening, developing increased redness pain swelling drainage    ED Prescriptions    Medication Sig Dispense Auth. Provider   mupirocin ointment (BACTROBAN) 2 % Apply 1 application topically 2 (two) times daily. 30 g Ricardo Kayes, [place] C, PA-C     PDMP not reviewed this encounter.   Carren Blakley, [place] C, PA-C 07/30/20 1031

## 2020-08-09 ENCOUNTER — Encounter: Payer: BC Managed Care – PPO | Attending: Physician Assistant | Admitting: Physician Assistant

## 2020-08-09 ENCOUNTER — Other Ambulatory Visit: Payer: Self-pay

## 2020-08-09 DIAGNOSIS — R680 Hypothermia, not associated with low environmental temperature: Secondary | ICD-10-CM | POA: Insufficient documentation

## 2020-08-09 DIAGNOSIS — L97822 Non-pressure chronic ulcer of other part of left lower leg with fat layer exposed: Secondary | ICD-10-CM | POA: Diagnosis not present

## 2020-08-09 DIAGNOSIS — I890 Lymphedema, not elsewhere classified: Secondary | ICD-10-CM | POA: Diagnosis not present

## 2020-08-09 DIAGNOSIS — I10 Essential (primary) hypertension: Secondary | ICD-10-CM | POA: Diagnosis not present

## 2020-08-09 DIAGNOSIS — I872 Venous insufficiency (chronic) (peripheral): Secondary | ICD-10-CM | POA: Diagnosis not present

## 2020-08-09 NOTE — Progress Notes (Signed)
PRYCE, FOLTS ([id number]) Visit Report for 08/09/2020 Abuse/Suicide Risk Screen Details Patient Name: [name], Tyler Jayton S. Date of Service: 08/09/2020 8:30 AM Medical Record Number: [id number] Patient Account Number: [id number] Date of Birth/Sex: 10-19-1960 (60 y.o. M) Treating RN: Carlene Coria Primary Care Jerriyah Louis: Jill Alexanders Other Clinician: Jeanine Luz Referring Librado Guandique: Debara Pickett Treating Breda Bond/Extender: Skipper Cliche in Treatment: 0 Abuse/Suicide Risk Screen Items Answer ABUSE RISK SCREEN: Has anyone close to you tried to hurt or harm you recentlyo No Do you feel uncomfortable with anyone in your familyo No Has anyone forced you do things that you didnot want to doo No Electronic Signature(s) Signed: 08/09/2020 4:27:39 PM By: Carlene Coria RN Entered By: Carlene Coria on 08/09/2020 09:08:28 Porras, Gerardo S. ([id number]) -------------------------------------------------------------------------------- Activities of Daily Living Details Patient Name: Li, [name] Date of Service: 08/09/2020 8:30 AM Medical Record Number: [id number] Patient Account Number: [id number] Date of Birth/Sex: [birth year] (59 y.o. M) Treating RN: Carlene Coria Primary Care De Libman: Jill Alexanders Other Clinician: Jeanine Luz Referring Genni Buske: Debara Pickett Treating Linsi Humann/Extender: Skipper Cliche in Treatment: 0 Activities of Daily Living Items Answer Activities of Daily Living (Please select one for each item) Drive Automobile Completely Able Take Medications Completely Able Use Telephone Completely Able Care for Appearance Completely Able Use Toilet Completely Able Bath / Shower Completely Able Dress Self Completely Able Feed Self Completely Able Walk Completely Able Get In / Out Bed Completely Able Housework Completely Able Prepare Meals Completely Able Handle Money Completely Able Shop for Self Completely Able Electronic Signature(s) Signed: 08/09/2020 4:27:39 PM By:  Carlene Coria RN Entered By: Carlene Coria on 08/09/2020 09:34:48 Masi, Ramirez S. ([id number]) -------------------------------------------------------------------------------- Education Screening Details Patient Name: [name], [name] Date of Service: 08/09/2020 8:30 AM Medical Record Number: [id number] Patient Account Number: [id number] Date of Birth/Sex: 1961/01/08 (60 y.o. M) Treating RN: Carlene Coria Primary Care Gee Habig: Jill Alexanders Other Clinician: Jeanine Luz Referring Ellese Julius: Debara Pickett Treating Paxon Propes/Extender: Skipper Cliche in Treatment: 0 Primary Learner Assessed: Patient Learning Preferences/Education Level/Primary Language Learning Preference: Explanation Highest Education Level: High School Preferred Language: English Cognitive Barrier Language Barrier: No Translator Needed: No Memory Deficit: No Emotional Barrier: No Cultural/Religious Beliefs Affecting Medical Care: No Physical Barrier Impaired Vision: No Impaired Hearing: No Decreased Hand dexterity: No Knowledge/Comprehension Knowledge Level: High Comprehension Level: High Ability to understand written instructions: High Ability to understand verbal instructions: High Motivation Anxiety Level: Anxious Education Importance: Acknowledges Need Interest in Health Problems: Asks Questions Perception: Coherent Willingness to Engage in Self-Management High Activities: Readiness to Engage in Self-Management High Activities: Electronic Signature(s) Signed: 08/09/2020 4:27:39 PM By: Carlene Coria RN Entered By: Carlene Coria on 08/09/2020 09:35:32 Gavina, Mozell S. ([id number]) -------------------------------------------------------------------------------- Fall Risk Assessment Details Patient Name: [name], [name] Date of Service: 08/09/2020 8:30 AM Medical Record Number: [id number] Patient Account Number: [id number] Date of Birth/Sex: 1960/08/03 (60 y.o. M) Treating RN: Carlene Coria Primary Care  Jenni Thew: Jill Alexanders Other Clinician: Jeanine Luz Referring Mareesa Gathright: Debara Pickett Treating Zenith Lamphier/Extender: Skipper Cliche in Treatment: 0 Fall Risk Assessment Items Have you had 2 or more falls in the last 12 monthso 0 No Have you had any fall that resulted in injury in the last 12 monthso 0 No FALLS RISK SCREEN History of falling - immediate or within 3 months 0 No Secondary diagnosis (Do you have 2 or more medical diagnoseso) 0 No Ambulatory aid None/bed rest/wheelchair/nurse 0 No Crutches/cane/walker 0 No Furniture 0 No Intravenous therapy Access/Saline/Heparin Lock 0 No Gait/Transferring Normal/ bed rest/ wheelchair 0  No Weak (short steps with or without shuffle, stooped but able to lift head while walking, may 0 No seek support from furniture) Impaired (short steps with shuffle, may have difficulty arising from chair, head down, impaired 0 No balance) Mental Status Oriented to own ability 0 No Electronic Signature(s) Signed: 08/09/2020 4:27:39 PM By: Carlene Coria RN Entered By: Carlene Coria on 08/09/2020 09:35:48 Clinkenbeard, Odysseus S. ([id number]) -------------------------------------------------------------------------------- Foot Assessment Details Patient Name: [name], [name] Date of Service: 08/09/2020 8:30 AM Medical Record Number: [id number] Patient Account Number: [id number] Date of Birth/Sex: Apr 28, 1961 (60 y.o. M) Treating RN: Carlene Coria Primary Care Arlita Buffkin: Jill Alexanders Other Clinician: Jeanine Luz Referring Raushanah Osmundson: Debara Pickett Treating Clayson Riling/Extender: Skipper Cliche in Treatment: 0 Foot Assessment Items Site Locations + = Sensation present, - = Sensation absent, C = Callus, U = Ulcer R = Redness, W = Warmth, M = Maceration, PU = Pre-ulcerative lesion F = Fissure, S = Swelling, D = Dryness Assessment Right: Left: Other Deformity: No No Prior Foot Ulcer: No No Prior Amputation: No No Charcot Joint: No No Ambulatory Status:  Ambulatory Without Help Gait: Steady Electronic Signature(s) Signed: 08/09/2020 4:27:39 PM By: Carlene Coria RN Entered By: Carlene Coria on 08/09/2020 09:37:03 [place], Kellen S. ([id number]) -------------------------------------------------------------------------------- Nutrition Risk Screening Details Patient Name: DELVECCHIO, Elyan S. Date of Service: 08/09/2020 8:30 AM Medical Record Number: [id number] Patient Account Number: [id number] Date of Birth/Sex: 07/08/1960 (60 y.o. M) Treating RN: Carlene Coria Primary Care Marilee Ditommaso: Jill Alexanders Other Clinician: Jeanine Luz Referring Truett Mcfarlan: Debara Pickett Treating Naima Veldhuizen/Extender: Skipper Cliche in Treatment: 0 Height (in): 65 Weight (lbs): 155 Body Mass Index (BMI): 25.8 Nutrition Risk Screening Items Score Screening NUTRITION RISK SCREEN: I have an illness or condition that made me change the kind and/or amount of food I eat 2 Yes I eat fewer than two meals per day 0 No I eat few fruits and vegetables, or milk products 0 No I have three or more drinks of beer, liquor or wine almost every day 0 No I have tooth or mouth problems that make it hard for me to eat 2 Yes I don't always have enough money to buy the food I need 0 No I eat alone most of the time 0 No I take three or more different prescribed or over-the-counter drugs a day 1 Yes Without wanting to, I have lost or gained 10 pounds in the last six months 0 No I am not always physically able to shop, cook and/or feed myself 0 No Nutrition Protocols Good Risk Protocol Moderate Risk Protocol 0 Provide education on nutrition High Risk Proctocol Risk Level: Moderate Risk Score: 5 Electronic Signature(s) Signed: 08/09/2020 4:27:39 PM By: Carlene Coria RN Entered By: Carlene Coria on 08/09/2020 09:36:51

## 2020-08-09 NOTE — Progress Notes (Signed)
[name], [name] ([id number]) Visit Report for 08/09/2020 Allergy List Details Patient Name: [name], Tyler Lamel S. Date of Service: 08/09/2020 8:30 AM Medical Record Number: [id number] Patient Account Number: [id number] Date of Birth/Sex: [birth year] (60 y.o. M) Treating RN: [name] Primary Care [name]: [name] Other Clinician: Jeanine Li Referring [name]: [name] Treating [name]/Extender: [name] Weeks in Treatment: 0 Allergies Active Allergies atorvastatin chlorhexidine gluconate Allergy Notes Electronic Signature(s) Signed: 08/09/2020 4:27:39 PM By: Tyler Coria RN Entered By: [name] on 08/09/2020 09:34:15 Pentecost, [name] ([id number]) -------------------------------------------------------------------------------- Arrival Information Details Patient Name: [name], [name] Date of Service: 08/09/2020 8:30 AM Medical Record Number: [id number] Patient Account Number: [id number] Date of Birth/Sex: 03-28-1961 (60 y.o. M) Treating RN: [name] Primary Care [name]: [name] Other Clinician: Jeanine Li Referring [name]: [name] Treating [name]/Extender: [name] in Treatment: 0 Visit Information Patient Arrived: Ambulatory Arrival Time: 08:40 Accompanied By: self Transfer Assistance: None Patient Identification Verified: Yes Secondary Verification Process Completed: Yes Patient Requires Transmission-Based No Precautions: Patient Has Alerts: Yes Patient Alerts: Patient on Blood Thinner Electronic Signature(s) Signed: 08/09/2020 4:27:39 PM By: Tyler Coria RN Entered By: [name] on 08/09/2020 08:57:09 Saffo, [name] ([id number]) -------------------------------------------------------------------------------- Clinic Level of Care Assessment Details Patient Name: [name], [name] Date of Service: 08/09/2020 8:30 AM Medical Record Number: [id number] Patient Account Number: [id number] Date of Birth/Sex: 1960-09-07 (60 y.o.  M) Treating RN: [name] Primary Care Chin Wachter: [name] Other Clinician: Jeanine Li Referring Abdulhamid Olgin: [name] Treating Lekeith Wulf/Extender: [name] in Treatment: 0 Clinic Level of Care Assessment Items TOOL 3 Quantity Score X - Use when EandM and Procedure is performed on FOLLOW-UP visit 1 0 ASSESSMENTS - Nursing Assessment / Reassessment X - Reassessment of Co-morbidities (includes updates in patient status) 1 10 X- 1 5 Reassessment of Adherence to Treatment Plan ASSESSMENTS - Wound and Skin Assessment / Reassessment []  - Points for Wound Assessment can only be taken for a new wound of unknown or different etiology and a 0 procedure is NOT performed to that wound X- 1 5 Simple Wound Assessment / Reassessment - one wound []  - 0 Complex Wound Assessment / Reassessment - multiple wounds []  - 0 Dermatologic / Skin Assessment (not related to wound area) ASSESSMENTS - Focused Assessment []  - Circumferential Edema Measurements - multi extremities 0 []  - 0 Nutritional Assessment / Counseling / Intervention []  - 0 Lower Extremity Assessment (monofilament, tuning fork, pulses) X- 1 10 Peripheral Arterial Disease Assessment (using hand held doppler) ASSESSMENTS - Ostomy and/or Continence Assessment and Care []  - Incontinence Assessment and Management 0 []  - 0 Ostomy Care Assessment and Management (repouching, etc.) PROCESS - Coordination of Care []  - Points for Discharge Coordination can only be taken for a new wound of unknown or different etiology and a 0 procedure is NOT performed to that wound X- 1 15 Simple Patient / Family Education for ongoing care []  - 0 Complex (extensive) Patient / Family Education for ongoing care []  - 0 Staff obtains Programmer, systems, Records, Test Results / Process Orders []  - 0 Staff telephones HHA, Nursing Homes / Clarify orders / etc []  - 0 Routine Transfer to another Facility (non-emergent condition) []  - 0 Routine  Hospital Admission (non-emergent condition) []  - 0 New Admissions / Biomedical engineer / Ordering NPWT, Apligraf, etc. []  - 0 Emergency Hospital Admission (emergent condition) X- 1 10 Simple Discharge Coordination []  - 0 Complex (extensive) Discharge Coordination PROCESS - Special Needs []  - Pediatric / Minor Patient Management  0 []  - 0 Isolation Patient Management []  - 0 Hearing / Language / Visual special needs []  - 0 Assessment of Community assistance (transportation, D/C planning, etc.) Rohr, [name] ([id number]) []  - 0 Additional assistance / Altered mentation []  - 0 Support Surface(s) Assessment (bed, cushion, seat, etc.) INTERVENTIONS - Wound Cleansing / Measurement []  - Points for Wound Cleaning / Measurement, Wound Dressing, Specimen Collection and Specimen taken to lab 0 can only be taken for a new wound of unknown or different etiology and a procedure is NOT performed to that wound X- 1 5 Simple Wound Cleansing - one wound []  - 0 Complex Wound Cleansing - multiple wounds X- 1 5 Wound Imaging (photographs - any number of wounds) []  - 0 Wound Tracing (instead of photographs) X- 1 5 Simple Wound Measurement - one wound []  - 0 Complex Wound Measurement - multiple wounds INTERVENTIONS - Wound Dressings []  - Small Wound Dressing one or multiple wounds 0 X- 1 15 Medium Wound Dressing one or multiple wounds []  - 0 Large Wound Dressing one or multiple wounds INTERVENTIONS - Miscellaneous []  - External ear exam 0 []  - 0 Specimen Collection (cultures, biopsies, blood, body fluids, etc.) []  - 0 Specimen(s) / Culture(s) sent or taken to Lab for analysis []  - 0 Patient Transfer (multiple staff / Civil Service fast streamer / Similar devices) []  - 0 Simple Staple / Suture removal (25 or less) []  - 0 Complex Staple / Suture removal (26 or more) []  - 0 Hypo / Hyperglycemic Management (close monitor of Blood Glucose) X- 1 15 Ankle / Brachial Index (ABI) - do not check if  billed separately X- 1 5 Vital Signs Has the patient been seen at the hospital within the last three years: Yes Total Score: 105 Level Of Care: New/Established - Level 3 Electronic Signature(s) Signed: 08/09/2020 4:53:32 PM By: Georges Mouse, Minus Breeding RN Entered By: Georges Mouse, Kenia on 08/09/2020 10:02:44 Ramseyer, Johntay S. ([id number]) -------------------------------------------------------------------------------- Compression Therapy Details Patient Name: [name], [name] Date of Service: 08/09/2020 8:30 AM Medical Record Number: [id number] Patient Account Number: [id number] Date of Birth/Sex: February 14, 1961 (60 y.o. M) Treating RN: [name] Primary Care Johathan Province: [name] Other Clinician: Jeanine Li Referring Fulton Merry: [name] Treating Ixchel Duck/Extender: [name] in Treatment: 0 Compression Therapy Performed for Wound Assessment: Wound #1 Left,Medial Lower Leg Performed By: Clinician Tyler Amen, RN Compression Type: Three Layer Pre Treatment ABI: 0.7 Post Procedure Diagnosis Same as Pre-procedure Electronic Signature(s) Signed: 08/09/2020 4:53:32 PM By: Georges Mouse, Minus Breeding RN Entered By: Georges Mouse, Kenia on 08/09/2020 09:59:45 Kump, Sofia S. ([id number]) -------------------------------------------------------------------------------- Encounter Discharge Information Details Patient Name: [name], [name] Date of Service: 08/09/2020 8:30 AM Medical Record Number: [id number] Patient Account Number: [id number] Date of Birth/Sex: [birth year] (60 y.o. M) Treating RN: [name] Primary Care Zorion Nims: [name] Other Clinician: Jeanine Li Referring Yuleidy Rappleye: [name] Treating Dreyden Rohrman/Extender: [name] in Treatment: 0 Encounter Discharge Information Items Post Procedure Vitals Discharge Condition: Stable Temperature (F): 94.6 Ambulatory Status: Ambulatory Pulse (bpm): 62 Discharge Destination: Home Respiratory Rate  (breaths/min): 18 Transportation: Private Auto Blood Pressure (mmHg): 152/73 Accompanied By: self Schedule Follow-up Appointment: Yes Clinical Summary of Care: Electronic Signature(s) Signed: 08/09/2020 4:53:32 PM By: Georges Mouse, Minus Breeding RN Entered By: Georges Mouse, Minus Breeding on 08/09/2020 10:04:20 Dangerfield, Lavaughn S. ([id number]) -------------------------------------------------------------------------------- Lower Extremity Assessment Details Patient Name: [name], [name] Date of Service: 08/09/2020 8:30 AM Medical Record Number: [id number] Patient Account Number: [id number] Date of Birth/Sex: Jan 11, 1961 (60 y.o. M) Treating RN: Epps,  Morey Hummingbird Primary Care Oluwatimilehin Balfour: [name] Other Clinician: Jeanine Li Referring Priscella Donna: [name] Treating Kiyah Demartini/Extender: [name] in Treatment: 0 Edema Assessment Assessed: [Left: No] [Right: No] Edema: [Left: Ye] [Right: s] Calf Left: Right: Point of Measurement: 33 cm From Medial Instep 32 cm Ankle Left: Right: Point of Measurement: 12 cm From Medial Instep 22 cm Knee To Floor Left: Right: From Medial Instep 42 cm Vascular Assessment Pulses: Dorsalis Pedis Palpable: [Left:Yes] Blood Pressure: Brachial: [Left:152] Ankle: [Left:Dorsalis Pedis: 110 0.72] Electronic Signature(s) Signed: 08/09/2020 4:27:39 PM By: Tyler Coria RN Entered By: [name] on 08/09/2020 09:32:42 Schoon, Uzziel S. ([id number]) -------------------------------------------------------------------------------- Multi Wound Chart Details Patient Name: [name], [name] Date of Service: 08/09/2020 8:30 AM Medical Record Number: [id number] Patient Account Number: [id number] Date of Birth/Sex: 1960-06-24 (60 y.o. M) Treating RN: [name] Primary Care Danie Diehl: [name] Other Clinician: Jeanine Li Referring Sequita Wise: [name] Treating Coulter Oldaker/Extender: [name] in Treatment: 0 Vital Signs Height(in): 65 Pulse(bpm):  15 Weight(lbs): 155 Blood Pressure(mmHg): 152/73 Body Mass Index(BMI): 26 Temperature(F): 94.6 Respiratory Rate(breaths/min): 18 Photos: [N/A:N/A] Wound Location: Left, Medial Lower Leg N/A N/A Wounding Event: Trauma N/A N/A Primary Etiology: Venous Leg Ulcer N/A N/A Comorbid History: Glaucoma, Coronary Artery N/A N/A Disease, Hypertension, Neuropathy, Received Chemotherapy Date Acquired: 03/09/2020 N/A N/A Weeks of Treatment: 0 N/A N/A Wound Status: Open N/A N/A Clustered Wound: Yes N/A N/A Measurements L x W x D (cm) 3.1x2.5x0.1 N/A N/A Area (cm) : 6.087 N/A N/A Volume (cm) : 0.609 N/A N/A % Reduction in Area: 0.00% N/A N/A % Reduction in Volume: 0.00% N/A N/A Classification: Full Thickness Without Exposed N/A N/A Support Structures Exudate Amount: Small N/A N/A Exudate Type: Serous N/A N/A Exudate Color: amber N/A N/A Granulation Amount: None Present (0%) N/A N/A Necrotic Amount: Large (67-100%) N/A N/A Necrotic Tissue: Eschar, Adherent Slough N/A N/A Exposed Structures: Fascia: No N/A N/A Fat Layer (Subcutaneous Tissue): No Tendon: No Muscle: No Joint: No Bone: No Epithelialization: None N/A N/A Treatment Notes Electronic Signature(s) Signed: 08/09/2020 4:53:32 PM By: Georges Mouse, Minus Breeding RN Entered By: Georges Mouse, Minus Breeding on 08/09/2020 09:55:23 Pitcock, Jayce S. ([id number]) -------------------------------------------------------------------------------- Multi-Disciplinary Care Plan Details Patient Name: [name], [name] Date of Service: 08/09/2020 8:30 AM Medical Record Number: [id number] Patient Account Number: [id number] Date of Birth/Sex: 12/21/1960 (60 y.o. M) Treating RN: [name] Primary Care Nowell Sites: [name] Other Clinician: Jeanine Li Referring Bular Hickok: [name] Treating Alise Calais/Extender: [name] in Treatment: 0 Active Inactive Orientation to the Wound Care Program Nursing Diagnoses: Knowledge deficit related  to the wound healing center program Goals: Patient/caregiver will verbalize understanding of the [facility] Date Initiated: 08/09/2020 Target Resolution Date: 08/09/2020 Goal Status: Active Interventions: Provide education on orientation to the wound center Notes: Wound/Skin Impairment Nursing Diagnoses: Impaired tissue integrity Goals: Patient/caregiver will verbalize understanding of skin care regimen Date Initiated: 08/09/2020 Target Resolution Date: 08/09/2020 Goal Status: Active Ulcer/skin breakdown will have a volume reduction of 30% by week 4 Date Initiated: 08/09/2020 Target Resolution Date: 09/09/2020 Goal Status: Active Ulcer/skin breakdown will have a volume reduction of 50% by week 8 Date Initiated: 08/09/2020 Target Resolution Date: 10/09/2020 Goal Status: Active Ulcer/skin breakdown will have a volume reduction of 80% by week 12 Date Initiated: 08/09/2020 Target Resolution Date: 11/09/2020 Goal Status: Active Ulcer/skin breakdown will heal within 14 weeks Date Initiated: 08/09/2020 Target Resolution Date: 12/09/2020 Goal Status: Active Interventions: Assess patient/caregiver ability to obtain necessary supplies Assess patient/caregiver ability to perform ulcer/skin care regimen upon  admission and as needed Assess ulceration(s) every visit Provide education on ulcer and skin care Treatment Activities: Skin care regimen initiated : 08/09/2020 Notes: Electronic Signature(s) Signed: 08/09/2020 4:53:32 PM By: Georges Mouse, Minus Breeding RN Entered By: Georges Mouse, Kenia on 08/09/2020 09:55:06 [name], [name] ([id number]) -------------------------------------------------------------------------------- Pain Assessment Details Patient Name: [name], [name] Date of Service: 08/09/2020 8:30 AM Medical Record Number: [id number] Patient Account Number: [id number] Date of Birth/Sex: Oct 05, 1960 (60 y.o. M) Treating RN: [name] Primary Care Johnesha Acheampong: [name] Other Clinician: Jeanine Li Referring Shakyra Mattera: [name] Treating Ashlin Hidalgo/Extender: [name] in Treatment: 0 Active Problems Location of Pain Severity and Description of Pain Patient Has Paino Yes Site Locations With Dressing Change: Yes Duration of the Pain. Constant / Intermittento Intermittent How Long Does it Lasto Hours: Minutes: 10 Rate the pain. Current Pain Level: 0 Worst Pain Level: 8 Least Pain Level: 0 Tolerable Pain Level: 5 Character of Pain Describe the Pain: Aching Pain Management and Medication Current Pain Management: Medication: Yes Cold Application: No Rest: Yes Massage: No Activity: No T.E.N.S.: No Heat Application: No Leg drop or elevation: No Is the Current Pain Management Adequate: Inadequate How does your wound impact your activities of daily livingo Sleep: No Bathing: No Appetite: No Relationship With Others: No Bladder Continence: No Emotions: No Bowel Continence: No Work: No Toileting: No Drive: No Dressing: No Hobbies: No Electronic Signature(s) Signed: 08/09/2020 4:27:39 PM By: Tyler Coria RN Entered By: [name] on 08/09/2020 09:01:48 [name], [name] ([id number]) -------------------------------------------------------------------------------- Patient/Caregiver Education Details Patient Name: [name], Antavius S. Date of Service: 08/09/2020 8:30 AM Medical Record Number: [id number] Patient Account Number: [id number] Date of Birth/Gender: 1960-10-13 (60 y.o. M) Treating RN: [name] Primary Care Physician: [name] Other Clinician: Jeanine Li Referring Physician: Debara Li Treating Physician/Extender: [name] in Treatment: 0 Education Assessment Education Provided To: Patient Education Topics Provided Wound/Skin Impairment: Methods: Explain/Verbal Responses: State content correctly Electronic Signature(s) Signed: 08/09/2020 4:53:32 PM By: Georges Mouse, Minus Breeding RN Entered  By: Georges Mouse, Minus Breeding on 08/09/2020 10:03:29 [place], [place]. ([id number]) -------------------------------------------------------------------------------- Wound Assessment Details Patient Name: [name], Carnel S. Date of Service: 08/09/2020 8:30 AM Medical Record Number: [id number] Patient Account Number: [id number] Date of Birth/Sex: [birth year] (60 y.o. M) Treating RN: [name] Primary Care Hazell Siwik: [name] Other Clinician: Jeanine Li Referring Dayan Kreis: [name] Treating Crista Nuon/Extender: [name] in Treatment: 0 Wound Status Wound Number: 1 Primary Venous Leg Ulcer Etiology: Wound Location: Left, Medial Lower Leg Wound Open Wounding Event: Trauma Status: Date Acquired: 03/09/2020 Comorbid Glaucoma, Coronary Artery Disease, Hypertension, Weeks Of Treatment: 0 History: Neuropathy, Received Chemotherapy Clustered Wound: Yes Photos Wound Measurements Length: (cm) 3.1 Width: (cm) 2.5 Depth: (cm) 0.1 Area: (cm) 6.087 Volume: (cm) 0.609 % Reduction in Area: 0% % Reduction in Volume: 0% Epithelialization: None Tunneling: No Undermining: No Wound Description Classification: Full Thickness Without Exposed Support Structures Exudate Amount: Small Exudate Type: Serous Exudate Color: amber Foul Odor After Cleansing: No Slough/Fibrino Yes Wound Bed Granulation Amount: None Present (0%) Exposed Structure Necrotic Amount: Large (67-100%) Fascia Exposed: No Necrotic Quality: Eschar, Adherent Slough Fat Layer (Subcutaneous Tissue) Exposed: No Tendon Exposed: No Muscle Exposed: No Joint Exposed: No Bone Exposed: No Treatment Notes Wound #1 (Lower Leg) Wound Laterality: Left, Medial Cleanser Normal Saline Discharge Instruction: Wash your hands with soap and water. Remove old dressing, discard into plastic bag and place into trash. Cleanse the wound with Normal Saline prior to applying a clean dressing using gauze sponges, not tissues  or cotton  balls. Do not scrub or use excessive force. Pat dry using gauze sponges, not tissue or cotton balls. Tippins, Maysin S. ([id number]) Peri-Wound Care Triamcinolone Acetonide Cream, 0.1%, 15 (g) tube Topical Primary Dressing Hydrofera Blue Ready Transfer Foam, 2.5x2.5 (in/in) Discharge Instruction: Apply Hydrofera Blue Ready to wound bed as directed Secondary Dressing ABD Pad 5x9 (in/in) Discharge Instruction: Cover with ABD pad Secured With Compression Wrap Profore Lite LF 3 Multilayer Compression Index Discharge Instruction: Apply 3 multi-layer wrap as prescribed. Compression Stockings Add-Ons Electronic Signature(s) Signed: 08/09/2020 4:27:39 PM By: Tyler Coria RN Entered By: [name] on 08/09/2020 09:30:22 Girard, Riley S. ([id number]) -------------------------------------------------------------------------------- Vitals Details Patient Name: [name], Adonias S. Date of Service: 08/09/2020 8:30 AM Medical Record Number: [id number] Patient Account Number: [id number] Date of Birth/Sex: 08/02/1960 (60 y.o. M) Treating RN: [name] Primary Care Daksha Koone: [name] Other Clinician: Jeanine Li Referring Selam Pietsch: [name] Treating Nataline Basara/Extender: [name] in Treatment: 0 Vital Signs Time Taken: 09:01 Temperature (F): 94.6 Height (in): 65 Pulse (bpm): 62 Source: Stated Respiratory Rate (breaths/min): 18 Weight (lbs): 155 Blood Pressure (mmHg): 152/73 Source: Stated Reference Range: 80 - 120 mg / dl Body Mass Index (BMI): 25.8 Electronic Signature(s) Signed: 08/09/2020 4:27:39 PM By: Tyler Coria RN Entered By: [name] on 08/09/2020 09:02:21

## 2020-08-09 NOTE — Progress Notes (Signed)
WOFFORD, STRATTON ([id number]) Visit Report for 08/09/2020 Chief Complaint Document Details Patient Name: [name], Tyler Burel S. Date of Service: 08/09/2020 8:30 AM Medical Record Number: [id number] Patient Account Number: [id number] Date of Birth/Sex: 11/13/1960 (60 y.o. M) Treating Li: [name] Primary Care Provider: Jill Li Other Clinician: Jeanine Li Referring Provider: Debara Li Treating Provider/Extender: [name] in Treatment: 0 Information Obtained from: Patient Chief Complaint Left LE Ulcer Electronic Signature(s) Signed: 08/09/2020 9:45:29 AM By: Tyler Keeler Li Entered By: [name] on 08/09/2020 09:45:28 Li, [name] ([id number]) -------------------------------------------------------------------------------- Debridement Details Patient Name: [name], [name] Date of Service: 08/09/2020 8:30 AM Medical Record Number: [id number] Patient Account Number: [id number] Date of Birth/Sex: 1960/07/03 (60 y.o. M) Treating Li: [name] Primary Care Provider: Jill Li Other Clinician: Jeanine Li Referring Provider: Debara Li Treating Provider/Extender: [name] in Treatment: 0 Debridement Performed for Wound #1 Left,Medial Lower Leg Assessment: Performed By: Physician [name]., Li Debridement Type: Debridement Severity of Tissue Pre Debridement: Fat layer exposed Level of Consciousness (Pre- Awake and Alert procedure): Pre-procedure Verification/Time Out Yes - 09:58 Taken: Start Time: 09:58 Pain Control: Lidocaine 4% Topical Solution Total Area Debrided (L x W): 3.1 (cm) x 2.5 (cm) = 7.75 (cm) Tissue and other material Viable, Non-Viable, Slough, Subcutaneous, Slough debrided: Level: Skin/Subcutaneous Tissue Debridement Description: Excisional Instrument: Curette Bleeding: Minimum Hemostasis Achieved: Pressure Response to Treatment: Procedure was tolerated well Level of Consciousness (Post- Awake and  Alert procedure): Post Debridement Measurements of Total Wound Length: (cm) 3.1 Width: (cm) 2.5 Depth: (cm) 0.2 Volume: (cm) 1.217 Character of Wound/Ulcer Post Debridement: Stable Severity of Tissue Post Debridement: Fat layer exposed Post Procedure Diagnosis Same as Pre-procedure Electronic Signature(s) Signed: 08/09/2020 4:39:22 PM By: Tyler Keeler Li Signed: 08/09/2020 4:53:32 PM By: [name] Entered By: [name] on 08/09/2020 09:58:51 [place], [place]. ([id number]) -------------------------------------------------------------------------------- HPI Details Patient Name: [name], [name] Date of Service: 08/09/2020 8:30 AM Medical Record Number: [id number] Patient Account Number: [id number] Date of Birth/Sex: 01-12-1961 (60 y.o. M) Treating Li: [name] Primary Care Provider: Jill Li Other Clinician: Jeanine Li Referring Provider: Debara Li Treating Provider/Extender: [name] in Treatment: 0 History of Present Illness HPI Description: 08/09/2020 upon evaluation today patient appears to be doing somewhat poorly in regard to his left lower extremity. Subsequently he on January 14 was seen by the [facility] wound care center. It appears that they actually put him in a 4-layer compression wrap along with [facility] he tells me that felt pretty good but again that is quite a drive for him to get there. He mainly was being seen there is that is where his cancer doctor is. With that being said the patient tells me currently that he is having some discomfort at the site but is not terrible. He has been tolerating the dressing changes without complication which is great news. 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 He does seem to have potentially some issues with regulating his temperature which is consistent with this process. We saw that today as  well with his low vital sign temperature reading. The initial injury actually occurred when the patient dropped asphalt which landed on the anterior portion of his leg causing the injury. The good news is this does not appear to be too deep which hopefully should bode well for rapid healing. Electronic Signature(s) Signed: 08/09/2020 4:33:34 PM By: Tyler Lai  III, [name] Entered By: [name] on 08/09/2020 16:33:34 Li, [name] ([id number]) -------------------------------------------------------------------------------- Physical Exam Details Patient Name: [name], [name] Date of Service: 08/09/2020 8:30 AM Medical Record Number: [id number] Patient Account Number: [id number] Date of Birth/Sex: [birth year] (60 y.o. M) Treating Li: [name] Primary Care Provider: Jill Li Other Clinician: Jeanine Li Referring Provider: Debara Li Treating Provider/Extender: [name] in Treatment: 0 Constitutional patient is hypertensive.. pulse regular and within target range for patient.Marland Kitchen respirations regular, non-labored and within target range for patient.Marland Kitchen temperature within target range for patient.. Well-nourished and well-hydrated in no acute distress. Eyes conjunctiva clear no eyelid edema noted. pupils equal round and reactive to light and accommodation. Ears, Nose, Mouth, and Throat no gross abnormality of ear auricles or external auditory canals. normal hearing noted during conversation. mucus membranes moist. Respiratory normal breathing without difficulty. Cardiovascular 2+ dorsalis pedis/posterior tibialis pulses. no clubbing, cyanosis, significant edema, <3 sec cap refill. Musculoskeletal normal gait and posture. no significant deformity or arthritic changes, no loss or range of motion, no clubbing. Psychiatric this patient is able to make decisions and demonstrates good insight into disease process. Alert and Oriented x 3. pleasant and cooperative. Notes Upon  inspection patient's wound bed actually showed signs of some slough noted on the surface of the wound. With that being said this did require the performing of some sharp debridement clear away necrotic debris from the surface of the wound he tolerated that today without complication. Unfortunately he has not really been able to utilize compression as he tells me that unfortunately it just does not seem to do well for him causing other issues such as blistering. Nonetheless I think he could be a good candidate for a Velcro compression wrap to see if that would do better but I would like to wait until we get some of the swelling under better control. The patient is in agreement with that plan. Electronic Signature(s) Signed: 08/09/2020 4:36:46 PM By: Tyler Keeler Li Entered By: [name] on 08/09/2020 16:36:45 [name], [name] ([id number]) -------------------------------------------------------------------------------- Physician Orders Details Patient Name: [name], [name] Date of Service: 08/09/2020 8:30 AM Medical Record Number: [id number] Patient Account Number: [id number] Date of Birth/Sex: [birth year] (60 y.o. M) Treating Li: [name] Primary Care Provider: Jill Li Other Clinician: Jeanine Li Referring Provider: Debara Li Treating Provider/Extender: [name] in Treatment: 0 Verbal / Phone Orders: No Diagnosis Coding ICD-10 Coding Code Description I87.2 Venous insufficiency (chronic) (peripheral) L97.822 Non-pressure chronic ulcer of other part of left lower leg with fat layer exposed I10 Essential (primary) hypertension I25.10 Atherosclerotic heart disease of native coronary artery without angina pectoris R68.0 Hypothermia, not associated with low environmental temperature Follow-up Appointments o Return Appointment in 1 week. Bathing/ Shower/ Hygiene o May shower with wound dressing protected with water repellent cover or cast protector. o  No tub bath. Edema Control - Lymphedema / Segmental Compressive Device / Other Left Lower Extremity o Optional: One layer of unna paste to top of compression wrap (to act as an anchor). o 3 Layer Compression System for Lymphedema. o Elevate, Exercise Daily and Avoid Standing for Long Periods of Time. o Elevate legs to the level of the heart and pump ankles as often as possible o Elevate leg(s) parallel to the floor when sitting. Wound Treatment Wound #1 - Lower Leg Wound Laterality: Left, Medial Cleanser: Normal Saline 1 x Per Week/30 Days Discharge Instructions: Wash your hands with soap and water. Remove old dressing, discard into  plastic bag and place into trash. Cleanse the wound with Normal Saline prior to applying a clean dressing using gauze sponges, not tissues or cotton balls. Do not scrub or use excessive force. Pat dry using gauze sponges, not tissue or cotton balls. Peri-Wound Care: Triamcinolone Acetonide Cream, 0.1%, 15 (g) tube 1 x Per Week/30 Days Primary Dressing: Hydrofera Blue Ready Transfer Foam, 2.5x2.5 (in/in) 1 x Per Week/30 Days Discharge Instructions: Apply Hydrofera Blue Ready to wound bed as directed Secondary Dressing: ABD Pad 5x9 (in/in) 1 x Per Week/30 Days Discharge Instructions: Cover with ABD pad Compression Wrap: Profore Lite LF 3 Multilayer Compression Bandaging System 1 x Per Week/30 Days Discharge Instructions: Apply 3 multi-layer wrap as prescribed. Electronic Signature(s) Signed: 08/09/2020 4:39:22 PM By: Tyler Keeler Li Signed: 08/09/2020 4:53:32 PM By: [name] Entered By: [name] on 08/09/2020 10:01:33 [place], Chukwudi S. ([id number]) -------------------------------------------------------------------------------- Problem List Details Patient Name: THIELEN, Jontavius S. Date of Service: 08/09/2020 8:30 AM Medical Record Number: [id number] Patient Account Number: [id number] Date of Birth/Sex: 01/18/1961 (60 y.o.  M) Treating Li: [name] Primary Care Provider: Jill Li Other Clinician: Jeanine Li Referring Provider: Debara Li Treating Provider/Extender: [name] in Treatment: 0 Active Problems ICD-10 Encounter Code Description Active Date MDM Diagnosis I87.2 Venous insufficiency (chronic) (peripheral) 08/09/2020 No Yes L97.822 Non-pressure chronic ulcer of other part of left lower leg with fat layer 08/09/2020 No Yes exposed [place] (primary) hypertension 08/09/2020 No Yes I25.10 Atherosclerotic heart disease of native coronary artery without angina 08/09/2020 No Yes pectoris R68.0 Hypothermia, not associated with low environmental temperature 08/09/2020 No Yes Inactive Problems Resolved Problems Electronic Signature(s) Signed: 08/09/2020 9:45:04 AM By: Tyler Keeler Li Entered By: [name] on 08/09/2020 09:45:04 [name], [name] ([id number]) -------------------------------------------------------------------------------- Progress Note Details Patient Name: [name], [name] Date of Service: 08/09/2020 8:30 AM Medical Record Number: [id number] Patient Account Number: [id number] Date of Birth/Sex: [birth year] (60 y.o. M) Treating Li: [name] Primary Care Provider: Jill Li Other Clinician: Jeanine Li Referring Provider: Debara Li Treating Provider/Extender: [name] in Treatment: 0 Subjective Chief Complaint Information obtained from Patient Left LE Ulcer History of Present Illness (HPI) 08/09/2020 upon evaluation today patient appears to be doing somewhat poorly in regard to his left lower extremity. Subsequently he on January 14 was seen by the [facility] wound care center. It appears that they actually put him in a 4-layer compression wrap along with [facility] he tells me that felt pretty good but again that is quite a drive for him to get there. He mainly was being seen there is that is where his cancer doctor is. With  that being said the patient tells me currently that he is having some discomfort at the site but is not terrible. He has been tolerating the dressing changes without complication which is great news. 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 He does seem to have potentially some issues with regulating his temperature which is consistent with this process. We saw that today as well with his low vital sign temperature reading. The initial injury actually occurred when the patient dropped asphalt which landed on the anterior portion of his leg causing the injury. The good news is this does not appear to be too deep which hopefully should bode well for rapid healing. Patient History Information obtained from Patient. Allergies atorvastatin, chlorhexidine gluconate Social History Never smoker, Marital Status -  Married, Alcohol Use - Moderate, Drug Use - No History, Caffeine Use - Daily. Medical History Eyes Patient has history of Glaucoma Denies history of Cataracts Ear/Nose/Mouth/Throat Denies history of Chronic sinus problems/congestion, Middle ear problems Hematologic/Lymphatic Denies history of Anemia, Hemophilia, Human Immunodeficiency Virus, Lymphedema, Sickle Cell Disease Respiratory Denies history of Aspiration, Asthma, Chronic Obstructive Pulmonary Disease (COPD), Pneumothorax, Sleep Apnea, Tuberculosis Cardiovascular Patient has history of Coronary Artery Disease, Hypertension Denies history of Angina, Arrhythmia, Congestive Heart Failure, Deep Vein Thrombosis, Hypotension, Myocardial Infarction, Peripheral Arterial Disease, Peripheral Venous Disease, Phlebitis, Vasculitis Gastrointestinal Denies history of Cirrhosis , Colitis, Crohn s, Hepatitis A, Hepatitis B, Hepatitis C Endocrine Denies history of Type I Diabetes, Type II Diabetes Genitourinary Denies history of End Stage Renal  Disease Immunological Denies history of Lupus Erythematosus, Raynaud s, Scleroderma Integumentary (Skin) Denies history of History of Burn, History of pressure wounds Musculoskeletal Denies history of Gout, Rheumatoid Arthritis, Osteoarthritis, Osteomyelitis Neurologic Patient has history of Neuropathy - chemo Denies history of Dementia, Quadriplegia, Paraplegia, Seizure Disorder Oncologic Patient has history of Received Chemotherapy - nov 2017 Denies history of Received Radiation Psychiatric Denies history of Anorexia/bulimia, Confinement Anxiety Review of Systems (ROS) Constitutional Symptoms (General Health) Denies complaints or symptoms of Fatigue, Fever, Chills, Marked Weight Change. Eyes Umholtz, Everett S. ([id number]) Denies complaints or symptoms of Dry Eyes, Vision Changes, Glasses / Contacts. Ear/Nose/Mouth/Throat Denies complaints or symptoms of Difficult clearing ears, Sinusitis. Hematologic/Lymphatic Denies complaints or symptoms of Bleeding / Clotting Disorders, Human Immunodeficiency Virus. Respiratory Denies complaints or symptoms of Chronic or frequent coughs, Shortness of Breath. Cardiovascular Denies complaints or symptoms of Chest pain, LE edema. Gastrointestinal Denies complaints or symptoms of Frequent diarrhea, Nausea, Vomiting. Endocrine Denies complaints or symptoms of Hepatitis, Thyroid disease, Polydypsia (Excessive Thirst). Genitourinary Denies complaints or symptoms of Kidney failure/ Dialysis, Incontinence/dribbling. Immunological Denies complaints or symptoms of Hives, Itching. Integumentary (Skin) Complains or has symptoms of Wounds, Swelling. Denies complaints or symptoms of Bleeding or bruising tendency, Breakdown. Musculoskeletal Denies complaints or symptoms of Muscle Pain, Muscle Weakness. Neurologic Denies complaints or symptoms of Numbness/parasthesias, Focal/Weakness. Psychiatric Denies complaints or symptoms of Anxiety,  Claustrophobia. Objective Constitutional patient is hypertensive.. pulse regular and within target range for patient.Marland Kitchen respirations regular, non-labored and within target range for patient.Marland Kitchen temperature within target range for patient.. Well-nourished and well-hydrated in no acute distress. Vitals Time Taken: 9:01 AM, Height: 65 in, Source: Stated, Weight: 155 lbs, Source: Stated, BMI: 25.8, Temperature: 94.6 F, Pulse: 62 bpm, Respiratory Rate: 18 breaths/min, Blood Pressure: 152/73 mmHg. Eyes conjunctiva clear no eyelid edema noted. pupils equal round and reactive to light and accommodation. Ears, Nose, Mouth, and Throat no gross abnormality of ear auricles or external auditory canals. normal hearing noted during conversation. mucus membranes moist. Respiratory normal breathing without difficulty. Cardiovascular 2+ dorsalis pedis/posterior tibialis pulses. no clubbing, cyanosis, significant edema, Musculoskeletal normal gait and posture. no significant deformity or arthritic changes, no loss or range of motion, no clubbing. Psychiatric this patient is able to make decisions and demonstrates good insight into disease process. Alert and Oriented x 3. pleasant and cooperative. General Notes: Upon inspection patient's wound bed actually showed signs of some slough noted on the surface of the wound. With that being said this did require the performing of some sharp debridement clear away necrotic debris from the surface of the wound he tolerated that today without complication. Unfortunately he has not really been able to utilize compression as he tells me that unfortunately it just does not seem to do  well for him causing other issues such as blistering. Nonetheless I think he could be a good candidate for a Velcro compression wrap to see if that would do better but I would like to wait until we get some of the swelling under better control. The patient is in agreement with that  plan. Integumentary (Hair, Skin) Wound #1 status is Open. Original cause of wound was Trauma. The date acquired was: 03/09/2020. The wound is located on the Left,Medial Lower Leg. The wound measures 3.1cm length x 2.5cm width x 0.1cm depth; 6.087cm^2 area and 0.609cm^3 volume. There is no tunneling or undermining noted. There is a small amount of serous drainage noted. There is no granulation within the wound bed. There is a large (67-100%) amount of necrotic tissue within the wound bed including Eschar and Adherent Slough. [name], [name] ([id number]) Assessment Active Problems ICD-10 Venous insufficiency (chronic) (peripheral) Non-pressure chronic ulcer of other part of left lower leg with fat layer exposed Essential (primary) hypertension Atherosclerotic heart disease of native coronary artery without angina pectoris Hypothermia, not associated with low environmental temperature Procedures Wound #1 Pre-procedure diagnosis of Wound #1 is a Venous Leg Ulcer located on the Left,Medial Lower Leg .Severity of Tissue Pre Debridement is: Fat layer exposed. There was a Excisional Skin/Subcutaneous Tissue Debridement with a total area of 7.75 sq cm performed by [name]., Li. With the following instrument(s): Curette to remove Viable and Non-Viable tissue/material. Material removed includes Subcutaneous Tissue and Slough and after achieving pain control using Lidocaine 4% Topical Solution. A time out was conducted at 09:58, prior to the start of the procedure. A Minimum amount of bleeding was controlled with Pressure. The procedure was tolerated well. Post Debridement Measurements: 3.1cm length x 2.5cm width x 0.2cm depth; 1.217cm^3 volume. Character of Wound/Ulcer Post Debridement is stable. Severity of Tissue Post Debridement is: Fat layer exposed. Post procedure Diagnosis Wound #1: Same as Pre-Procedure Pre-procedure diagnosis of Wound #1 is a Venous Leg Ulcer located on the Left,Medial  Lower Leg . There was a Three Layer Compression Therapy Procedure with a pre-treatment ABI of 0.7 by Tyler Amen, Li. Post procedure Diagnosis Wound #1: Same as Pre-Procedure Plan Follow-up Appointments: Return Appointment in 1 week. Bathing/ Shower/ Hygiene: May shower with wound dressing protected with water repellent cover or cast protector. No tub bath. Edema Control - Lymphedema / Segmental Compressive Device / Other: Optional: One layer of unna paste to top of compression wrap (to act as an anchor). 3 Layer Compression System for Lymphedema. Elevate, Exercise Daily and Avoid Standing for Long Periods of Time. Elevate legs to the level of the heart and pump ankles as often as possible Elevate leg(s) parallel to the floor when sitting. WOUND #1: - Lower Leg Wound Laterality: Left, Medial Cleanser: Normal Saline 1 x Per Week/30 Days Discharge Instructions: Wash your hands with soap and water. Remove old dressing, discard into plastic bag and place into trash. Cleanse the wound with Normal Saline prior to applying a clean dressing using gauze sponges, not tissues or cotton balls. Do not scrub or use excessive force. Pat dry using gauze sponges, not tissue or cotton balls. Peri-Wound Care: Triamcinolone Acetonide Cream, 0.1%, 15 (g) tube 1 x Per Week/30 Days Primary Dressing: Hydrofera Blue Ready Transfer Foam, 2.5x2.5 (in/in) 1 x Per Week/30 Days Discharge Instructions: Apply Hydrofera Blue Ready to wound bed as directed Secondary Dressing: ABD Pad 5x9 (in/in) 1 x Per Week/30 Days Discharge Instructions: Cover with ABD pad Compression Wrap: Profore Lite  LF 3 Multilayer Compression Bandaging System 1 x Per Week/30 Days Discharge Instructions: Apply 3 multi-layer wrap as prescribed. 1. Would recommend at this point that we actually initiate treatment today with the use of Hydrofera Blue which I think will be a good option for him. 2. Muscle can recommend an ABD pad to cover. 3. Will  initiate treatment with a 3 layer compression wrap to help with edema control I think this is a good option as well as far as being of great benefit for him I think this will keep the edema under control and help the wound to heal more effectively and quickly. 4. I do want to see about ordering Velcro compression wraps that we will wait for next week in order to ensure that everything does seem to be doing well and that his swelling is under better control so we get a proper measurement I make sure we get the right size. [name], [name] ([id number]) We will see patient back for reevaluation in 1 week here in the clinic. If anything worsens or changes patient will contact our office for additional recommendations. Electronic Signature(s) Signed: 08/09/2020 4:38:05 PM By: Tyler Keeler Li Entered By: [name] on 08/09/2020 16:38:05 [name], [name] ([id number]) -------------------------------------------------------------------------------- ROS/PFSH Details Patient Name: [name], Shan S. Date of Service: 08/09/2020 8:30 AM Medical Record Number: [id number] Patient Account Number: [id number] Date of Birth/Sex: 03/16/1961 (60 y.o. M) Treating Li: Carlene Coria Primary Care Provider: Jill Li Other Clinician: Jeanine Li Referring Provider: Debara Li Treating Provider/Extender: [name] in Treatment: 0 Information Obtained From Patient Constitutional Symptoms (General Health) Complaints and Symptoms: Negative for: Fatigue; Fever; Chills; Marked Weight Change Eyes Complaints and Symptoms: Negative for: Dry Eyes; Vision Changes; Glasses / Contacts Medical History: Positive for: Glaucoma Negative for: Cataracts Ear/Nose/Mouth/Throat Complaints and Symptoms: Negative for: Difficult clearing ears; Sinusitis Medical History: Negative for: Chronic sinus problems/congestion; Middle ear problems Hematologic/Lymphatic Complaints and Symptoms: Negative for: Bleeding /  Clotting Disorders; Human Immunodeficiency Virus Medical History: Negative for: Anemia; Hemophilia; Human Immunodeficiency Virus; Lymphedema; Sickle Cell Disease Respiratory Complaints and Symptoms: Negative for: Chronic or frequent coughs; Shortness of Breath Medical History: Negative for: Aspiration; Asthma; Chronic Obstructive Pulmonary Disease (COPD); Pneumothorax; Sleep Apnea; Tuberculosis Cardiovascular Complaints and Symptoms: Negative for: Chest pain; LE edema Medical History: Positive for: Coronary Artery Disease; Hypertension Negative for: Angina; Arrhythmia; Congestive Heart Failure; Deep Vein Thrombosis; Hypotension; Myocardial Infarction; Peripheral Arterial Disease; Peripheral Venous Disease; Phlebitis; Vasculitis Gastrointestinal Complaints and Symptoms: Negative for: Frequent diarrhea; Nausea; Vomiting Medical History: Negative for: Cirrhosis ; Colitis; Crohnos; Hepatitis A; Hepatitis B; Hepatitis C Endocrine Ohmer, Mael S. ([id number]) Complaints and Symptoms: Negative for: Hepatitis; Thyroid disease; Polydypsia (Excessive Thirst) Medical History: Negative for: Type I Diabetes; Type II Diabetes Genitourinary Complaints and Symptoms: Negative for: Kidney failure/ Dialysis; Incontinence/dribbling Medical History: Negative for: End Stage Renal Disease Immunological Complaints and Symptoms: Negative for: Hives; Itching Medical History: Negative for: Lupus Erythematosus; Raynaudos; Scleroderma Integumentary (Skin) Complaints and Symptoms: Positive for: Wounds; Swelling Negative for: Bleeding or bruising tendency; Breakdown Medical History: Negative for: History of Burn; History of pressure wounds Musculoskeletal Complaints and Symptoms: Negative for: Muscle Pain; Muscle Weakness Medical History: Negative for: Gout; Rheumatoid Arthritis; Osteoarthritis; Osteomyelitis Neurologic Complaints and Symptoms: Negative for: Numbness/parasthesias;  Focal/Weakness Medical History: Positive for: Neuropathy - chemo Negative for: Dementia; Quadriplegia; Paraplegia; Seizure Disorder Psychiatric Complaints and Symptoms: Negative for: Anxiety; Claustrophobia Medical History: Negative for: Anorexia/bulimia; Confinement Anxiety Oncologic Medical History: Positive for: Received Chemotherapy - nov  2017 Negative for: Received Radiation HBO Extended History Items Eyes: Glaucoma Immunizations Pneumococcal Vaccine: Received Pneumococcal Vaccination: No Southers, Kurk S. ([id number]) Implantable Devices None Family and Social History Never smoker; Marital Status - Married; Alcohol Use: Moderate; Drug Use: No History; Caffeine Use: Daily; Financial Concerns: No; Food, Clothing or Shelter Needs: No; Support System Lacking: No; Transportation Concerns: No Electronic Signature(s) Signed: 08/09/2020 4:27:39 PM By: Carlene Coria Li Signed: 08/09/2020 4:39:22 PM By: Tyler Keeler Li Entered By: Carlene Coria on 08/09/2020 09:08:09 Pumphrey, Arnez S. ([id number]) -------------------------------------------------------------------------------- SuperBill Details Patient Name: [name], Joaovictor S. Date of Service: 08/09/2020 Medical Record Number: [id number] Patient Account Number: [id number] Date of Birth/Sex: [birth year] (60 y.o. M) Treating Li: [name] Primary Care Provider: Jill Li Other Clinician: Jeanine Li Referring Provider: Debara Li Treating Provider/Extender: [name] in Treatment: 0 Diagnosis Coding ICD-10 Codes Code Description I87.2 Venous insufficiency (chronic) (peripheral) L97.822 Non-pressure chronic ulcer of other part of left lower leg with fat layer exposed [place] (primary) hypertension I25.10 Atherosclerotic heart disease of native coronary artery without angina pectoris R68.0 Hypothermia, not associated with low environmental temperature Facility Procedures CPT4 Code: 35465681 Description: [place] VISIT-LEV 3 EST PT Modifier: Quantity: 1 CPT4 Code: 27517001 Description: 11042 - DEB SUBQ TISSUE 20 SQ CM/< Modifier: Quantity: 1 CPT4 Code: Description: ICD-10 Diagnosis Description L97.822 Non-pressure chronic ulcer of other part of left lower leg with fat layer ex Modifier: posed Quantity: CPT4 Code: 74944967 Description: (Facility Use Only) 29581LT - [place] LWR LT LEG Modifier: Quantity: 1 Physician Procedures CPT4 Code: 5916384 Description: WC PHYS LEVEL 3 o NEW PT Modifier: Quantity: 1 CPT4 Code: Description: ICD-10 Diagnosis Description I87.2 Venous insufficiency (chronic) (peripheral) L97.822 Non-pressure chronic ulcer of other part of left lower leg with fat layer I10 Essential (primary) hypertension I25.10 Atherosclerotic heart disease of  native coronary artery without angina pe Modifier: exposed ctoris Quantity: CPT4 Code: 6659935 Description: 70177 - WC PHYS SUBQ TISS 20 SQ CM Modifier: Quantity: 1 CPT4 Code: Description: ICD-10 Diagnosis Description L97.822 Non-pressure chronic ulcer of other part of left lower leg with fat layer Modifier: exposed Quantity: Electronic Signature(s) Signed: 08/09/2020 4:38:49 PM By: Tyler Keeler Li Entered By: [name] on 08/09/2020 16:38:49

## 2020-08-15 DIAGNOSIS — I82419 Acute embolism and thrombosis of unspecified femoral vein: Secondary | ICD-10-CM | POA: Diagnosis not present

## 2020-08-16 ENCOUNTER — Other Ambulatory Visit: Payer: Self-pay

## 2020-08-16 ENCOUNTER — Encounter: Payer: BC Managed Care – PPO | Admitting: Physician Assistant

## 2020-08-16 DIAGNOSIS — I872 Venous insufficiency (chronic) (peripheral): Secondary | ICD-10-CM | POA: Diagnosis not present

## 2020-08-16 DIAGNOSIS — L97822 Non-pressure chronic ulcer of other part of left lower leg with fat layer exposed: Secondary | ICD-10-CM | POA: Diagnosis not present

## 2020-08-16 NOTE — Progress Notes (Addendum)
Tyler, Li ([id number]) Visit Report for 08/16/2020 Chief Complaint Document Details Patient Name: [name], [name]. Date of Service: 08/16/2020 10:30 AM Medical Record Number: [id number] Patient Account Number: [id number] Date of Birth/Sex: 05-22-1961 (60 y.o. M) Treating RN: Dolan Amen Primary Care Provider: Jill Alexanders Other Clinician: Jeanine Luz Referring Provider: Jill Alexanders Treating Provider/Extender: Skipper Cliche in Treatment: 1 Information Obtained from: Patient Chief Complaint Left LE Ulcer Electronic Signature(s) Signed: 08/16/2020 11:10:35 AM By: Worthy Keeler PA-C Entered By: Worthy Keeler on 08/16/2020 11:10:34 [place], Lewis. ([id number]) -------------------------------------------------------------------------------- HPI Details Patient Name: [name], Treyvon S. Date of Service: 08/16/2020 10:30 AM Medical Record Number: [id number] Patient Account Number: [id number] Date of Birth/Sex: 18-Sep-1960 (60 y.o. M) Treating RN: Dolan Amen Primary Care Provider: Jill Alexanders Other Clinician: Jeanine Luz Referring Provider: Jill Alexanders Treating Provider/Extender: Skipper Cliche in Treatment: 1 History of Present Illness HPI Description: 08/09/2020 upon evaluation today patient appears to be doing somewhat poorly in regard to his left lower extremity. Subsequently he on January 14 was seen by the [facility] wound care center. It appears that they actually put him in a 4-layer compression wrap along with [facility] he tells me that felt pretty good but again that is quite a drive for him to get there. He mainly was being seen there is that is where his cancer doctor is. With that being said the patient tells me currently that he is having some discomfort at the site but is not terrible. He has been tolerating the dressing changes without complication which is great news. 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 He does seem to have potentially some issues with regulating his temperature which is consistent with this process. We saw that today as well with his low vital sign temperature reading. The initial injury actually occurred when the patient dropped asphalt which landed on the anterior portion of his leg causing the injury. The good news is this does not appear to be too deep which hopefully should bode well for rapid healing. 08/16/2020 on evaluation today patient appears to be doing well with regard to his wounds currently. In fact the wound appears to be about half the size of what it was last week and overall I am extremely pleased with where things stand. Fortunately there is no signs of active infection at this time. No fevers, chills, nausea, vomiting, or diarrhea. Electronic Signature(s) Signed: 08/16/2020 11:47:48 AM By: Worthy Keeler PA-C Entered By: Worthy Keeler on 08/16/2020 11:47:48 [place], Shenouda S. ([id number]) -------------------------------------------------------------------------------- Physical Exam Details Patient Name: Li, [name] Date of Service: 08/16/2020 10:30 AM Medical Record Number: [id number] Patient Account Number: [id number] Date of Birth/Sex: 1961/05/28 (60 y.o. M) Treating RN: Dolan Amen Primary Care Provider: Jill Alexanders Other Clinician: Jeanine Luz Referring Provider: Jill Alexanders Treating Provider/Extender: Jeri Cos Weeks in Treatment: 1 Constitutional Well-nourished and well-hydrated in no acute distress. Respiratory normal breathing without difficulty. Psychiatric this patient is able to make decisions and demonstrates good insight into disease process. Alert and Oriented x 3. pleasant and cooperative. Notes Upon inspection patient's wound bed showed signs of good granulation epithelization. I do not see any evidence of active infection which is great news and overall very pleased with where things stand  today. The patient likewise is extremely pleased that is doing so well and again infection is really nonexistent here. Electronic Signature(s) Signed: 08/16/2020 11:48:08 AM By: Joaquim Lai  III, Rosali Augello PA-C Entered By: Worthy Keeler on 08/16/2020 11:48:08 Sickinger, Talmage S. ([id number]) -------------------------------------------------------------------------------- Physician Orders Details Patient Name: Li, [name] Date of Service: 08/16/2020 10:30 AM Medical Record Number: [id number] Patient Account Number: [id number] Date of Birth/Sex: 1960/09/26 (60 y.o. M) Treating RN: Dolan Amen Primary Care Provider: Jill Alexanders Other Clinician: Jeanine Luz Referring Provider: Jill Alexanders Treating Provider/Extender: Skipper Cliche in Treatment: 1 Verbal / Phone Orders: No Diagnosis Coding ICD-10 Coding Code Description I87.2 Venous insufficiency (chronic) (peripheral) L97.822 Non-pressure chronic ulcer of other part of left lower leg with fat layer exposed I10 Essential (primary) hypertension I25.10 Atherosclerotic heart disease of native coronary artery without angina pectoris R68.0 Hypothermia, not associated with low environmental temperature Follow-up Appointments o Return Appointment in 1 week. Bathing/ Shower/ Hygiene o May shower with wound dressing protected with water repellent cover or cast protector. o No tub bath. Edema Control - Lymphedema / Segmental Compressive Device / Other Left Lower Extremity o Optional: One layer of unna paste to top of compression wrap (to act as an anchor). o 3 Layer Compression System for Lymphedema. o Elevate, Exercise Daily and Avoid Standing for Long Periods of Time. o Elevate legs to the level of the heart and pump ankles as often as possible o Elevate leg(s) parallel to the floor when sitting. Wound Treatment Wound #1 - Lower Leg Wound Laterality: Left, Medial Cleanser: Normal Saline 1 x Per Week/30 Days Discharge  Instructions: Wash your hands with soap and water. Remove old dressing, discard into plastic bag and place into trash. Cleanse the wound with Normal Saline prior to applying a clean dressing using gauze sponges, not tissues or cotton balls. Do not scrub or use excessive force. Pat dry using gauze sponges, not tissue or cotton balls. Peri-Wound Care: Triamcinolone Acetonide Cream, 0.1%, 15 (g) tube 1 x Per Week/30 Days Primary Dressing: Hydrofera Blue Ready Transfer Foam, 2.5x2.5 (in/in) 1 x Per Week/30 Days Discharge Instructions: Apply Hydrofera Blue Ready to wound bed as directed Secondary Dressing: ABD Pad 5x9 (in/in) 1 x Per Week/30 Days Discharge Instructions: Cover with ABD pad Compression Wrap: Profore Lite LF 3 Multilayer Compression Bandaging System 1 x Per Week/30 Days Discharge Instructions: Apply 3 multi-layer wrap as prescribed. Electronic Signature(s) Signed: 08/16/2020 7:06:18 PM By: Worthy Keeler PA-C Entered By: Worthy Keeler on 08/16/2020 11:43:47 Weed, Francisca S. ([id number]) -------------------------------------------------------------------------------- Problem List Details Patient Name: GLASPY, Kyrus S. Date of Service: 08/16/2020 10:30 AM Medical Record Number: [id number] Patient Account Number: [id number] Date of Birth/Sex: 1960/10/10 (60 y.o. M) Treating RN: Dolan Amen Primary Care Provider: Jill Alexanders Other Clinician: Jeanine Luz Referring Provider: Jill Alexanders Treating Provider/Extender: Skipper Cliche in Treatment: 1 Active Problems ICD-10 Encounter Code Description Active Date MDM Diagnosis I87.2 Venous insufficiency (chronic) (peripheral) 08/09/2020 No Yes L97.822 Non-pressure chronic ulcer of other part of left lower leg with fat layer 08/09/2020 No Yes exposed [place] (primary) hypertension 08/09/2020 No Yes I25.10 Atherosclerotic heart disease of native coronary artery without angina 08/09/2020 No Yes pectoris R68.0 Hypothermia, not  associated with low environmental temperature 08/09/2020 No Yes Inactive Problems Resolved Problems Electronic Signature(s) Signed: 08/16/2020 11:10:30 AM By: Worthy Keeler PA-C Entered By: Worthy Keeler on 08/16/2020 11:10:29 Blattner, Jakarie S. ([id number]) -------------------------------------------------------------------------------- Progress Note Details Patient Name: [name], Branch S. Date of Service: 08/16/2020 10:30 AM Medical Record Number: [id number] Patient Account Number: [id number] Date of Birth/Sex: 08/04/1960 (60 y.o. M) Treating RN: Dolan Amen Primary Care Provider: Jill Alexanders Other Clinician: Jeanine Luz  Referring Provider: Jill Alexanders Treating Provider/Extender: Skipper Cliche in Treatment: 1 Subjective Chief Complaint Information obtained from Patient Left LE Ulcer History of Present Illness (HPI) 08/09/2020 upon evaluation today patient appears to be doing somewhat poorly in regard to his left lower extremity. Subsequently he on January 14 was seen by the [facility] wound care center. It appears that they actually put him in a 4-layer compression wrap along with [facility] he tells me that felt pretty good but again that is quite a drive for him to get there. He mainly was being seen there is that is where his cancer doctor is. With that being said the patient tells me currently that he is having some discomfort at the site but is not terrible. He has been tolerating the dressing changes without complication which is great news. 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 He does seem to have potentially some issues with regulating his temperature which is consistent with this process. We saw that today as well with his low vital sign temperature reading. The initial injury actually occurred when the patient dropped asphalt which landed on the anterior portion of his leg causing the  injury. The good news is this does not appear to be too deep which hopefully should bode well for rapid healing. 08/16/2020 on evaluation today patient appears to be doing well with regard to his wounds currently. In fact the wound appears to be about half the size of what it was last week and overall I am extremely pleased with where things stand. Fortunately there is no signs of active infection at this time. No fevers, chills, nausea, vomiting, or diarrhea. Objective Constitutional Well-nourished and well-hydrated in no acute distress. Vitals Time Taken: 10:45 AM, Height: 65 in, Weight: 155 lbs, BMI: 25.8, Temperature: 98.2 F, Pulse: 55 bpm, Respiratory Rate: 16 breaths/min, Blood Pressure: 96/59 mmHg. Respiratory normal breathing without difficulty. Psychiatric this patient is able to make decisions and demonstrates good insight into disease process. Alert and Oriented x 3. pleasant and cooperative. General Notes: Upon inspection patient's wound bed showed signs of good granulation epithelization. I do not see any evidence of active infection which is great news and overall very pleased with where things stand today. The patient likewise is extremely pleased that is doing so well and again infection is really nonexistent here. Integumentary (Hair, Skin) Wound #1 status is Open. Original cause of wound was Trauma. The date acquired was: 03/09/2020. The wound has been in treatment 1 weeks. The wound is located on the Left,Medial Lower Leg. The wound measures 2cm length x 1.2cm width x 0.1cm depth; 1.885cm^2 area and 0.188cm^3 volume. There is Fat Layer (Subcutaneous Tissue) exposed. There is no tunneling or undermining noted. There is a small amount of serous drainage noted. There is large (67-100%) red, pink granulation within the wound bed. There is no necrotic tissue within the wound bed. Assessment Active Problems ICD-10 Venous insufficiency (chronic) (peripheral) Korol, Arnett S.  ([id number]) Non-pressure chronic ulcer of other part of left lower leg with fat layer exposed Essential (primary) hypertension Atherosclerotic heart disease of native coronary artery without angina pectoris Hypothermia, not associated with low environmental temperature Procedures Wound #1 Pre-procedure diagnosis of Wound #1 is a Venous Leg Ulcer located on the Left,Medial Lower Leg . There was a Three Layer Compression Therapy Procedure with a pre-treatment ABI of 0.7 by Dolan Amen, RN. Post procedure Diagnosis Wound #1: Same as  Pre-Procedure Plan Follow-up Appointments: Return Appointment in 1 week. Bathing/ Shower/ Hygiene: May shower with wound dressing protected with water repellent cover or cast protector. No tub bath. Edema Control - Lymphedema / Segmental Compressive Device / Other: Optional: One layer of unna paste to top of compression wrap (to act as an anchor). 3 Layer Compression System for Lymphedema. Elevate, Exercise Daily and Avoid Standing for Long Periods of Time. Elevate legs to the level of the heart and pump ankles as often as possible Elevate leg(s) parallel to the floor when sitting. WOUND #1: - Lower Leg Wound Laterality: Left, Medial Cleanser: Normal Saline 1 x Per Week/30 Days Discharge Instructions: Wash your hands with soap and water. Remove old dressing, discard into plastic bag and place into trash. Cleanse the wound with Normal Saline prior to applying a clean dressing using gauze sponges, not tissues or cotton balls. Do not scrub or use excessive force. Pat dry using gauze sponges, not tissue or cotton balls. Peri-Wound Care: Triamcinolone Acetonide Cream, 0.1%, 15 (g) tube 1 x Per Week/30 Days Primary Dressing: Hydrofera Blue Ready Transfer Foam, 2.5x2.5 (in/in) 1 x Per Week/30 Days Discharge Instructions: Apply Hydrofera Blue Ready to wound bed as directed Secondary Dressing: ABD Pad 5x9 (in/in) 1 x Per Week/30 Days Discharge Instructions: Cover  with ABD pad Compression Wrap: Profore Lite LF 3 Multilayer Compression Bandaging System 1 x Per Week/30 Days Discharge Instructions: Apply 3 multi-layer wrap as prescribed. 1. Would recommend currently that we going continue with the wound care measures as before and the patient is in agreement with the plan that includes the use of the hydrocortisone followed by [facility] dressing which I think is done a great job for him. 2. I am also can recommend we continue with a 3 layer compression wrap which I feel like has been beneficial. 3. I am also going to suggest patient continue to elevate his legs much as possible to help with edema control. We will see patient back for reevaluation in 1 week here in the clinic. If anything worsens or changes patient will contact our office for additional recommendations. Electronic Signature(s) Signed: 08/16/2020 11:48:39 AM By: Worthy Keeler PA-C Entered By: Worthy Keeler on 08/16/2020 11:48:38 Komperda, Quanell S. ([id number]) -------------------------------------------------------------------------------- SuperBill Details Patient Name: [name], Uel S. Date of Service: 08/16/2020 Medical Record Number: [id number] Patient Account Number: [id number] Date of Birth/Sex: 1960-12-08 (60 y.o. M) Treating RN: Dolan Amen Primary Care Provider: Jill Alexanders Other Clinician: Jeanine Luz Referring Provider: Jill Alexanders Treating Provider/Extender: Skipper Cliche in Treatment: 1 Diagnosis Coding ICD-10 Codes Code Description I87.2 Venous insufficiency (chronic) (peripheral) L97.822 Non-pressure chronic ulcer of other part of left lower leg with fat layer exposed I10 Essential (primary) hypertension I25.10 Atherosclerotic heart disease of native coronary artery without angina pectoris R68.0 Hypothermia, not associated with low environmental temperature Physician Procedures CPT4 Code: 4665993 Description: 99213 - WC PHYS LEVEL 3 - EST  PT Modifier: Quantity: 1 CPT4 Code: Description: ICD-10 Diagnosis Description I87.2 Venous insufficiency (chronic) (peripheral) L97.822 Non-pressure chronic ulcer of other part of left lower leg with fat lay I10 Essential (primary) hypertension I25.10 Atherosclerotic heart disease of  native coronary artery without angina Modifier: er exposed pectoris Quantity: Electronic Signature(s) Signed: 08/16/2020 11:48:50 AM By: Worthy Keeler PA-C Entered By: Worthy Keeler on 08/16/2020 11:48:50

## 2020-08-17 DIAGNOSIS — Z951 Presence of aortocoronary bypass graft: Secondary | ICD-10-CM | POA: Diagnosis not present

## 2020-08-17 DIAGNOSIS — C9000 Multiple myeloma not having achieved remission: Secondary | ICD-10-CM | POA: Diagnosis not present

## 2020-08-17 DIAGNOSIS — D509 Iron deficiency anemia, unspecified: Secondary | ICD-10-CM | POA: Diagnosis not present

## 2020-08-17 DIAGNOSIS — G620 Drug-induced polyneuropathy: Secondary | ICD-10-CM | POA: Diagnosis not present

## 2020-08-17 DIAGNOSIS — C9001 Multiple myeloma in remission: Secondary | ICD-10-CM | POA: Diagnosis not present

## 2020-08-17 DIAGNOSIS — Z9481 Bone marrow transplant status: Secondary | ICD-10-CM | POA: Diagnosis not present

## 2020-08-17 DIAGNOSIS — M25552 Pain in left hip: Secondary | ICD-10-CM | POA: Diagnosis not present

## 2020-08-17 DIAGNOSIS — Z79899 Other long term (current) drug therapy: Secondary | ICD-10-CM | POA: Diagnosis not present

## 2020-08-17 DIAGNOSIS — D801 Nonfamilial hypogammaglobulinemia: Secondary | ICD-10-CM | POA: Diagnosis not present

## 2020-08-17 DIAGNOSIS — R413 Other amnesia: Secondary | ICD-10-CM | POA: Diagnosis not present

## 2020-08-17 DIAGNOSIS — M349 Systemic sclerosis, unspecified: Secondary | ICD-10-CM | POA: Diagnosis not present

## 2020-08-17 DIAGNOSIS — M25551 Pain in right hip: Secondary | ICD-10-CM | POA: Diagnosis not present

## 2020-08-17 DIAGNOSIS — I2510 Atherosclerotic heart disease of native coronary artery without angina pectoris: Secondary | ICD-10-CM | POA: Diagnosis not present

## 2020-08-17 DIAGNOSIS — R0609 Other forms of dyspnea: Secondary | ICD-10-CM | POA: Diagnosis not present

## 2020-08-17 DIAGNOSIS — I82419 Acute embolism and thrombosis of unspecified femoral vein: Secondary | ICD-10-CM | POA: Diagnosis not present

## 2020-08-17 DIAGNOSIS — D89811 Chronic graft-versus-host disease: Secondary | ICD-10-CM | POA: Diagnosis not present

## 2020-08-17 DIAGNOSIS — M109 Gout, unspecified: Secondary | ICD-10-CM | POA: Diagnosis not present

## 2020-08-17 DIAGNOSIS — T8609 Other complications of bone marrow transplant: Secondary | ICD-10-CM | POA: Diagnosis not present

## 2020-08-22 ENCOUNTER — Ambulatory Visit: Payer: BC Managed Care – PPO | Admitting: Physician Assistant

## 2020-08-22 NOTE — Progress Notes (Signed)
MOHAMUD, Li ([id number]) Visit Report for 08/16/2020 Arrival Information Details Patient Name: [name], [name]. Date of Service: 08/16/2020 10:30 AM Medical Record Number: [id number] Patient Account Number: [id number] Date of Birth/Sex: 01-Mar-1961 (60 y.o. M) Treating RN: Dolan Amen Primary Care Aveleen Nevers: Jill Alexanders Other Clinician: Jeanine Luz Referring Nabiha Planck: Jill Alexanders Treating Jemima Petko/Extender: Skipper Cliche in Treatment: 1 Visit Information History Since Last Visit Added or deleted any medications: No Patient Arrived: Ambulatory Had a fall or experienced change in No Arrival Time: 10:47 activities of daily living that may affect Accompanied By: self risk of falls: Transfer Assistance: None Hospitalized since last visit: No Patient Identification Verified: Yes Pain Present Now: No Secondary Verification Process Completed: Yes Patient Requires Transmission-Based No Precautions: Patient Has Alerts: Yes Patient Alerts: Patient on Blood Thinner Electronic Signature(s) Signed: 08/16/2020 3:37:26 PM By: Jeanine Luz Entered By: Jeanine Luz on 08/16/2020 10:47:31 Patalano, Savaughn S. ([id number]) -------------------------------------------------------------------------------- Clinic Level of Care Assessment Details Patient Name: [name], [name] Date of Service: 08/16/2020 10:30 AM Medical Record Number: [id number] Patient Account Number: [id number] Date of Birth/Sex: Apr 11, 1961 (60 y.o. M) Treating RN: Carlene Coria Primary Care Anaysia Germer: Jill Alexanders Other Clinician: Jeanine Luz Referring Japheth Diekman: Jill Alexanders Treating Kymberlie Brazeau/Extender: Skipper Cliche in Treatment: 1 Clinic Level of Care Assessment Items TOOL 1 Quantity Score []  - Use when EandM and Procedure is performed on INITIAL visit 0 ASSESSMENTS - Nursing Assessment / Reassessment []  - General Physical Exam (combine w/ comprehensive assessment (listed just below) when performed on new 0 pt.  evals) []  - 0 Comprehensive Assessment (HX, ROS, Risk Assessments, Wounds Hx, etc.) ASSESSMENTS - Wound and Skin Assessment / Reassessment []  - Dermatologic / Skin Assessment (not related to wound area) 0 ASSESSMENTS - Ostomy and/or Continence Assessment and Care []  - Incontinence Assessment and Management 0 []  - 0 Ostomy Care Assessment and Management (repouching, etc.) PROCESS - Coordination of Care []  - Simple Patient / Family Education for ongoing care 0 []  - 0 Complex (extensive) Patient / Family Education for ongoing care []  - 0 Staff obtains Programmer, systems, Records, Test Results / Process Orders []  - 0 Staff telephones HHA, Nursing Homes / Clarify orders / etc []  - 0 Routine Transfer to another Facility (non-emergent condition) []  - 0 Routine Hospital Admission (non-emergent condition) []  - 0 New Admissions / Biomedical engineer / Ordering NPWT, Apligraf, etc. []  - 0 Emergency Hospital Admission (emergent condition) PROCESS - Special Needs []  - Pediatric / Minor Patient Management 0 []  - 0 Isolation Patient Management []  - 0 Hearing / Language / Visual special needs []  - 0 Assessment of Community assistance (transportation, D/C planning, etc.) []  - 0 Additional assistance / Altered mentation []  - 0 Support Surface(s) Assessment (bed, cushion, seat, etc.) INTERVENTIONS - Miscellaneous []  - External ear exam 0 []  - 0 Patient Transfer (multiple staff / Civil Service fast streamer / Similar devices) []  - 0 Simple Staple / Suture removal (25 or less) []  - 0 Complex Staple / Suture removal (26 or more) []  - 0 Hypo/Hyperglycemic Management (do not check if billed separately) []  - 0 Ankle / Brachial Index (ABI) - do not check if billed separately Has the patient been seen at the hospital within the last three years: Yes Total Score: 0 Level Of Care: ____ [name] ([id number]) Electronic Signature(s) Signed: 08/22/2020 5:08:01 PM By: Carlene Coria RN Entered By: Carlene Coria on  08/16/2020 13:13:02 Magos, Khaled S. ([id number]) -------------------------------------------------------------------------------- Compression Therapy Details Patient Name: [name], [name] Date of Service: 08/16/2020  10:30 AM Medical Record Number: [id number] Patient Account Number: [id number] Date of Birth/Sex: 1961-03-20 (60 y.o. M) Treating RN: Dolan Amen Primary Care Devanie Galanti: Jill Alexanders Other Clinician: Jeanine Luz Referring Fabian Walder: Jill Alexanders Treating Zyen Triggs/Extender: Jeri Cos Weeks in Treatment: 1 Compression Therapy Performed for Wound Assessment: Wound #1 Left,Medial Lower Leg Performed By: Clinician Dolan Amen, RN Compression Type: Three Layer Pre Treatment ABI: 0.7 Post Procedure Diagnosis Same as Pre-procedure Electronic Signature(s) Signed: 08/16/2020 7:06:18 PM By: Worthy Keeler PA-C Entered By: Worthy Keeler on 08/16/2020 11:44:23 Sarria, Ran S. ([id number]) -------------------------------------------------------------------------------- Encounter Discharge Information Details Patient Name: [name], [name] Date of Service: 08/16/2020 10:30 AM Medical Record Number: [id number] Patient Account Number: [id number] Date of Birth/Sex: January 27, 1961 (60 y.o. M) Treating RN: Carlene Coria Primary Care Dara Camargo: Jill Alexanders Other Clinician: Jeanine Luz Referring Devera Englander: Jill Alexanders Treating Fatin Bachicha/Extender: Skipper Cliche in Treatment: 1 Encounter Discharge Information Items Discharge Condition: Stable Ambulatory Status: Ambulatory Discharge Destination: Home Transportation: Private Auto Accompanied By: self Schedule Follow-up Appointment: Yes Clinical Summary of Care: Patient Declined Electronic Signature(s) Signed: 08/22/2020 5:08:01 PM By: Carlene Coria RN Entered By: Carlene Coria on 08/16/2020 11:56:01 Mas, Gatlin S. ([id number]) -------------------------------------------------------------------------------- Lower Extremity Assessment  Details Patient Name: Li, [name] Date of Service: 08/16/2020 10:30 AM Medical Record Number: [id number] Patient Account Number: [id number] Date of Birth/Sex: 05-Jun-1960 (60 y.o. M) Treating RN: Dolan Amen Primary Care Ryden Wainer: Jill Alexanders Other Clinician: Jeanine Luz Referring Yaroslav Gombos: Jill Alexanders Treating Chizuko Trine/Extender: Jeri Cos Weeks in Treatment: 1 Edema Assessment Assessed: Shirlyn Goltz: Yes] Patrice Paradise: Yes] [Left: Edema] [Right: :] Calf Left: Right: Point of Measurement: 33 cm From Medial Instep 31.5 cm Ankle Left: Right: Point of Measurement: 12 cm From Medial Instep 21 cm Vascular Assessment Pulses: Dorsalis Pedis Palpable: [Left:Yes] Electronic Signature(s) Signed: 08/16/2020 3:37:26 PM By: Jeanine Luz Signed: 08/16/2020 5:09:07 PM By: Georges Mouse, Minus Breeding RN Entered By: Jeanine Luz on 08/16/2020 11:00:46 Flanders, Burley. ([id number]) -------------------------------------------------------------------------------- Multi Wound Chart Details Patient Name: [name], Vivan S. Date of Service: 08/16/2020 10:30 AM Medical Record Number: [id number] Patient Account Number: [id number] Date of Birth/Sex: [birth year] (60 y.o. M) Treating RN: Carlene Coria Primary Care Meiko Ives: Jill Alexanders Other Clinician: Jeanine Luz Referring Unique Searfoss: Jill Alexanders Treating Vinh Sachs/Extender: Skipper Cliche in Treatment: 1 Vital Signs Height(in): 65 Pulse(bpm): 43 Weight(lbs): 155 Blood Pressure(mmHg): 87/59 Body Mass Index(BMI): 26 Temperature(F): 98.2 Respiratory Rate(breaths/min): 16 Photos: [N/A:N/A] Wound Location: Left, Medial Lower Leg N/A N/A Wounding Event: Trauma N/A N/A Primary Etiology: Venous Leg Ulcer N/A N/A Comorbid History: Glaucoma, Coronary Artery N/A N/A Disease, Hypertension, Neuropathy, Received Chemotherapy Date Acquired: 03/09/2020 N/A N/A Weeks of Treatment: 1 N/A N/A Wound Status: Open N/A N/A Clustered Wound: Yes N/A  N/A Measurements L x W x D (cm) 2x1.2x0.1 N/A N/A Area (cm) : 1.885 N/A N/A Volume (cm) : 0.188 N/A N/A % Reduction in Area: 69.00% N/A N/A % Reduction in Volume: 69.10% N/A N/A Classification: Full Thickness Without Exposed N/A N/A Support Structures Exudate Amount: Small N/A N/A Exudate Type: Serous N/A N/A Exudate Color: amber N/A N/A Granulation Amount: Large (67-100%) N/A N/A Granulation Quality: Red, Pink N/A N/A Necrotic Amount: None Present (0%) N/A N/A Exposed Structures: Fat Layer (Subcutaneous Tissue): N/A N/A Yes Fascia: No Tendon: No Muscle: No Joint: No Bone: No Epithelialization: None N/A N/A Procedures Performed: Compression Therapy N/A N/A Treatment Notes Wound #1 (Lower Leg) Wound Laterality: Left, Medial Cleanser Normal Saline Discharge Instruction: Wash your hands with soap and water. Remove old dressing, discard into plastic  bag and place into trash. Cleanse the wound with Normal Saline prior to applying a clean dressing using gauze sponges, not tissues or cotton balls. Do not Rood, Kavan S. ([id number]) scrub or use excessive force. Pat dry using gauze sponges, not tissue or cotton balls. Peri-Wound Care Triamcinolone Acetonide Cream, 0.1%, 15 (g) tube Topical Primary Dressing Hydrofera Blue Ready Transfer Foam, 2.5x2.5 (in/in) Discharge Instruction: Apply Hydrofera Blue Ready to wound bed as directed Secondary Dressing ABD Pad 5x9 (in/in) Discharge Instruction: Cover with ABD pad Secured With Compression Wrap Profore Lite LF 3 Multilayer Compression [place] Discharge Instruction: Apply 3 multi-layer wrap as prescribed. Compression Stockings Add-Ons Electronic Signature(s) Signed: 08/22/2020 5:08:01 PM By: Carlene Coria RN Entered By: Carlene Coria on 08/16/2020 13:12:52 Devoss, Kenan S. ([id number]) -------------------------------------------------------------------------------- [place] Details Patient Name: [name], Demir  S. Date of Service: 08/16/2020 10:30 AM Medical Record Number: [id number] Patient Account Number: [id number] Date of Birth/Sex: [birth year] (60 y.o. M) Treating RN: Carlene Coria Primary Care Kolby Schara: Jill Alexanders Other Clinician: Jeanine Luz Referring Alisse Tuite: Jill Alexanders Treating Ramir Malerba/Extender: Skipper Cliche in Treatment: 1 Active Inactive Orientation to the Wound Care Program Nursing Diagnoses: Knowledge deficit related to the wound healing center program Goals: Patient/caregiver will verbalize understanding of the [place] Program Date Initiated: 08/09/2020 Target Resolution Date: 08/09/2020 Goal Status: Active Interventions: Provide education on orientation to the wound center Notes: Wound/Skin Impairment Nursing Diagnoses: Impaired tissue integrity Goals: Patient/caregiver will verbalize understanding of skin care regimen Date Initiated: 08/09/2020 Target Resolution Date: 08/09/2020 Goal Status: Active Ulcer/skin breakdown will have a volume reduction of 30% by week 4 Date Initiated: 08/09/2020 Target Resolution Date: 09/09/2020 Goal Status: Active Ulcer/skin breakdown will have a volume reduction of 50% by week 8 Date Initiated: 08/09/2020 Target Resolution Date: 10/09/2020 Goal Status: Active Ulcer/skin breakdown will have a volume reduction of 80% by week 12 Date Initiated: 08/09/2020 Target Resolution Date: 11/09/2020 Goal Status: Active Ulcer/skin breakdown will heal within 14 weeks Date Initiated: 08/09/2020 Target Resolution Date: 12/09/2020 Goal Status: Active Interventions: Assess patient/caregiver ability to obtain necessary supplies Assess patient/caregiver ability to perform ulcer/skin care regimen upon admission and as needed Assess ulceration(s) every visit Provide education on ulcer and skin care Treatment Activities: Skin care regimen initiated : 08/09/2020 Notes: Electronic Signature(s) Signed: 08/22/2020 5:08:01 PM By: Carlene Coria RN Entered By: Carlene Coria on 08/16/2020 13:12:42 Germani, Johm S. ([id number]) -------------------------------------------------------------------------------- Pain Assessment Details Patient Name: [name], Twan S. Date of Service: 08/16/2020 10:30 AM Medical Record Number: [id number] Patient Account Number: [id number] Date of Birth/Sex: 05-23-1961 (60 y.o. M) Treating RN: Dolan Amen Primary Care Chon Buhl: Jill Alexanders Other Clinician: Jeanine Luz Referring Artem Bunte: Jill Alexanders Treating Aleyna Cueva/Extender: Skipper Cliche in Treatment: 1 Active Problems Location of Pain Severity and Description of Pain Patient Has Paino No Site Locations Pain Management and Medication Current Pain Management: Electronic Signature(s) Signed: 08/16/2020 3:37:26 PM By: Jeanine Luz Signed: 08/16/2020 5:09:07 PM By: Georges Mouse, Minus Breeding RN Entered By: Jeanine Luz on 08/16/2020 10:49:20 Stull, Antario S. ([id number]) -------------------------------------------------------------------------------- Patient/Caregiver Education Details Patient Name: [name], Nichalos S. Date of Service: 08/16/2020 10:30 AM Medical Record Number: [id number] Patient Account Number: [id number] Date of Birth/Gender: 15-Feb-1961 (60 y.o. M) Treating RN: Carlene Coria Primary Care Physician: Jill Alexanders Other Clinician: Jeanine Luz Referring Physician: Jill Alexanders Treating Physician/Extender: Skipper Cliche in Treatment: 1 Education Assessment Education Provided To: Patient Education Topics Provided Wound/Skin Impairment: Methods: Explain/Verbal Responses: State content correctly Electronic Signature(s) Signed: 08/22/2020 5:08:01 PM By: Carlene Coria RN  Entered By: Carlene Coria on 08/16/2020 13:13:21 Ackert, Bakari S. ([id number]) -------------------------------------------------------------------------------- Wound Assessment Details Patient Name: AZER, Jaiceon S. Date of Service: 08/16/2020 10:30 AM Medical  Record Number: [id number] Patient Account Number: [id number] Date of Birth/Sex: Apr 28, 1961 (60 y.o. M) Treating RN: Dolan Amen Primary Care Gerrianne Aydelott: Jill Alexanders Other Clinician: Jeanine Luz Referring Malon Siddall: Jill Alexanders Treating Yadriel Kerrigan/Extender: Jeri Cos Weeks in Treatment: 1 Wound Status Wound Number: 1 Primary Venous Leg Ulcer Etiology: Wound Location: Left, Medial Lower Leg Wound Open Wounding Event: Trauma Status: Date Acquired: 03/09/2020 Comorbid Glaucoma, Coronary Artery Disease, Hypertension, Weeks Of Treatment: 1 History: Neuropathy, Received Chemotherapy Clustered Wound: Yes Photos Wound Measurements Length: (cm) 2 Width: (cm) 1.2 Depth: (cm) 0.1 Area: (cm) 1.885 Volume: (cm) 0.188 % Reduction in Area: 69% % Reduction in Volume: 69.1% Epithelialization: None Tunneling: No Undermining: No Wound Description Classification: Full Thickness Without Exposed Support Structures Exudate Amount: Small Exudate Type: Serous Exudate Color: amber Foul Odor After Cleansing: No Slough/Fibrino No Wound Bed Granulation Amount: Large (67-100%) Exposed Structure Granulation Quality: Red, Pink Fascia Exposed: No Necrotic Amount: None Present (0%) Fat Layer (Subcutaneous Tissue) Exposed: Yes Tendon Exposed: No Muscle Exposed: No Joint Exposed: No Bone Exposed: No Treatment Notes Wound #1 (Lower Leg) Wound Laterality: Left, Medial Cleanser Normal Saline Discharge Instruction: Wash your hands with soap and water. Remove old dressing, discard into plastic bag and place into trash. Cleanse the wound with Normal Saline prior to applying a clean dressing using gauze sponges, not tissues or cotton balls. Do not scrub or use excessive force. Pat dry using gauze sponges, not tissue or cotton balls. Fahr, Rolfe S. ([id number]) Peri-Wound Care Triamcinolone Acetonide Cream, 0.1%, 15 (g) tube Topical Primary Dressing Hydrofera Blue Ready Transfer Foam, 2.5x2.5  (in/in) Discharge Instruction: Apply Hydrofera Blue Ready to wound bed as directed Secondary Dressing ABD Pad 5x9 (in/in) Discharge Instruction: Cover with ABD pad Secured With Compression Wrap Profore Lite LF 3 Multilayer Compression [place] Discharge Instruction: Apply 3 multi-layer wrap as prescribed. Compression Stockings Add-Ons Electronic Signature(s) Signed: 08/16/2020 3:37:26 PM By: Jeanine Luz Signed: 08/16/2020 5:09:07 PM By: Georges Mouse, Minus Breeding RN Entered By: Jeanine Luz on 08/16/2020 11:00:00 Jobe, Hezekiah S. ([id number]) -------------------------------------------------------------------------------- Vitals Details Patient Name: [name], Miloh S. Date of Service: 08/16/2020 10:30 AM Medical Record Number: [id number] Patient Account Number: [id number] Date of Birth/Sex: 1961-04-13 (60 y.o. M) Treating RN: Dolan Amen Primary Care Brylie Sneath: Jill Alexanders Other Clinician: Jeanine Luz Referring Leylah Tarnow: Jill Alexanders Treating Mildred Bollard/Extender: Skipper Cliche in Treatment: 1 Vital Signs Time Taken: 10:45 Temperature (F): 98.2 Height (in): 65 Pulse (bpm): 55 Weight (lbs): 155 Respiratory Rate (breaths/min): 16 Body Mass Index (BMI): 25.8 Blood Pressure (mmHg): 96/59 Reference Range: 80 - 120 mg / dl Electronic Signature(s) Signed: 08/16/2020 3:37:26 PM By: Jeanine Luz Entered By: Jeanine Luz on 08/16/2020 10:49:12

## 2020-08-24 ENCOUNTER — Encounter: Payer: BC Managed Care – PPO | Admitting: Internal Medicine

## 2020-08-24 ENCOUNTER — Other Ambulatory Visit: Payer: Self-pay

## 2020-08-24 DIAGNOSIS — L97822 Non-pressure chronic ulcer of other part of left lower leg with fat layer exposed: Secondary | ICD-10-CM | POA: Diagnosis not present

## 2020-08-24 DIAGNOSIS — I872 Venous insufficiency (chronic) (peripheral): Secondary | ICD-10-CM | POA: Diagnosis not present

## 2020-08-24 DIAGNOSIS — I10 Essential (primary) hypertension: Secondary | ICD-10-CM | POA: Diagnosis not present

## 2020-08-24 DIAGNOSIS — I890 Lymphedema, not elsewhere classified: Secondary | ICD-10-CM | POA: Diagnosis not present

## 2020-08-24 DIAGNOSIS — R680 Hypothermia, not associated with low environmental temperature: Secondary | ICD-10-CM | POA: Diagnosis not present

## 2020-08-25 DIAGNOSIS — Z95818 Presence of other cardiac implants and grafts: Secondary | ICD-10-CM | POA: Diagnosis not present

## 2020-08-25 DIAGNOSIS — I088 Other rheumatic multiple valve diseases: Secondary | ICD-10-CM | POA: Diagnosis not present

## 2020-08-25 DIAGNOSIS — I77810 Thoracic aortic ectasia: Secondary | ICD-10-CM | POA: Diagnosis not present

## 2020-08-25 DIAGNOSIS — Z9889 Other specified postprocedural states: Secondary | ICD-10-CM | POA: Diagnosis not present

## 2020-08-25 DIAGNOSIS — I351 Nonrheumatic aortic (valve) insufficiency: Secondary | ICD-10-CM | POA: Diagnosis not present

## 2020-08-25 NOTE — Progress Notes (Signed)
[name] ([id number]) Visit Report for 08/24/2020 HPI Details Patient Name: [name], [name]. Date of Service: 08/24/2020 10:15 AM Medical Record Number: [id number] Patient Account Number: [id number] Date of Birth/Sex: April 30, 1961 (60 y.o. M) Treating RN: Cornell Barman Primary Care Provider: Jill Alexanders Other Clinician: Jeanine Luz Referring Provider: Jill Alexanders Treating Provider/Extender: Tito Dine in Treatment: 2 History of Present Illness HPI Description: 08/09/2020 upon evaluation today patient appears to be doing somewhat poorly in regard to his left lower extremity. Subsequently he on January 14 was seen by the [facility] wound care center. It appears that they actually put him in a 4-layer compression wrap along with [facility] he tells me that felt pretty good but again that is quite a drive for him to get there. He mainly was being seen there is that is where his cancer doctor is. With that being said the patient tells me currently that he is having some discomfort at the site but is not terrible. He has been tolerating the dressing changes without complication which is great news. 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 He does seem to have potentially some issues with regulating his temperature which is consistent with this process. We saw that today as well with his low vital sign temperature reading. The initial injury actually occurred when the patient dropped asphalt which landed on the anterior portion of his leg causing the injury. The good news is this does not appear to be too deep which hopefully should bode well for rapid healing. 08/16/2020 on evaluation today patient appears to be doing well with regard to his wounds currently. In fact the wound appears to be about half the size of what it was last week and overall I am extremely pleased with where things stand. Fortunately  there is no signs of active infection at this time. No fevers, chills, nausea, vomiting, or diarrhea. 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 We have been using Hydrofera Blue under 3 layer compression Electronic Signature(s) Signed: 08/25/2020 8:16:57 AM By: Linton Ham MD Entered By: Linton Ham on 08/24/2020 11:47:35 [name], [name] ([id number]) -------------------------------------------------------------------------------- Physical Exam Details Patient Name: [name], [name] Date of Service: 08/24/2020 10:15 AM Medical Record Number: [id number] Patient Account Number: [id number] Date of Birth/Sex: [birth year] (60 y.o. M) Treating RN: Cornell Barman Primary Care Provider: Jill Alexanders Other Clinician: Jeanine Luz Referring Provider: Jill Alexanders Treating Provider/Extender: Tito Dine in Treatment: 2 Notes Wound exam; the patient's wound looks as though it is improving. Surface area is smaller. No debridement is required Electronic Signature(s) Signed: 08/25/2020 8:16:57 AM By: Linton Ham MD Entered By: Linton Ham on 08/24/2020 11:58:41 [name], [name] ([id number]) -------------------------------------------------------------------------------- Physician Orders Details Patient Name: [name], [name] Date of Service: 08/24/2020 10:15 AM Medical Record Number: [id number] Patient Account Number: [id number] Date of Birth/Sex: 1960-12-16 (60 y.o. M) Treating RN: Cornell Barman Primary Care Provider: Jill Alexanders Other Clinician: Jeanine Luz Referring Provider: Jill Alexanders Treating Provider/Extender: Tito Dine in Treatment: 2 Verbal / Phone Orders: No Diagnosis Coding Follow-up Appointments o Return Appointment in 1 week. Bathing/ Shower/ Hygiene o May shower with wound dressing protected with water repellent cover or cast protector. o No tub bath. Edema Control - Lymphedema /  Segmental Compressive Device / Other Left Lower Extremity o Optional: One layer of unna paste to top of compression wrap (to  act as an anchor). o Elevate, Exercise Daily and Avoid Standing for Long Periods of Time. o Elevate legs to the level of the heart and pump ankles as often as possible o Elevate leg(s) parallel to the floor when sitting. Wound Treatment Wound #1 - Lower Leg Wound Laterality: Left, Medial Cleanser: Normal Saline 1 x Per Week/30 Days Discharge Instructions: Wash your hands with soap and water. Remove old dressing, discard into plastic bag and place into trash. Cleanse the wound with Normal Saline prior to applying a clean dressing using gauze sponges, not tissues or cotton balls. Do not scrub or use excessive force. Pat dry using gauze sponges, not tissue or cotton balls. Peri-Wound Care: Triamcinolone Acetonide Cream, 0.1%, 15 (g) tube 1 x Per Week/30 Days Primary Dressing: Hydrofera Blue Ready Transfer Foam, 2.5x2.5 (in/in) 1 x Per Week/30 Days Discharge Instructions: Apply Hydrofera Blue Ready to wound bed as directed Secondary Dressing: ABD Pad 5x9 (in/in) 1 x Per Week/30 Days Discharge Instructions: Cover with ABD pad Compression Wrap: Profore Lite LF 3 Multilayer Compression Bandaging System 1 x Per Week/30 Days Discharge Instructions: Apply 3 multi-layer wrap as prescribed. Electronic Signature(s) Signed: 08/24/2020 1:17:06 PM By: Gretta Cool, BSN, RN, CWS, Kim RN, BSN Signed: 08/25/2020 8:16:57 AM By: Linton Ham MD Entered By: Gretta Cool, BSN, RN, CWS, Kim on 08/24/2020 11:09:12 [name] ([id number]) -------------------------------------------------------------------------------- Problem List Details Patient Name: [name], [name] Date of Service: 08/24/2020 10:15 AM Medical Record Number: [id number] Patient Account Number: [id number] Date of Birth/Sex: 31-Mar-1961 (60 y.o. 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 ([id number]) -------------------------------------------------------------------------------- Progress Note Details Patient Name: [name], [name] Date of Service: 08/24/2020 10:15 AM Medical Record Number: [id number] Patient Account Number: [id number] Date of Birth/Sex: May 27, 1961 (61 y.o. M) Treating RN: Cornell Barman Primary Care Provider: Jill Alexanders Other Clinician: Jeanine Luz Referring Provider: Jill Alexanders Treating Provider/Extender: Tito Dine in Treatment: 2 Subjective History of Present Illness (HPI) 08/09/2020 upon evaluation today patient appears to be doing somewhat poorly in regard to his left lower extremity. Subsequently he on January 14 was seen by the [facility] wound care center. It appears that they actually put him in a 4-layer compression wrap along with [facility] he tells me that felt pretty good but again that is quite a drive for him to get there. He mainly was being seen there is that is where his cancer doctor is. With that being said the patient tells me currently that he is having some discomfort at the site but is not terrible. He has been tolerating the dressing  changes without complication which is great news. 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 He does seem to have potentially some issues with regulating his temperature which is consistent with this process. We saw that  today as well with his low vital sign temperature reading. The initial injury actually occurred when the patient dropped asphalt which landed on the anterior portion of his leg causing the injury. The good news is this does not appear to be too deep which hopefully should bode well for rapid healing. 08/16/2020 on evaluation today patient appears to be doing well with regard to his wounds currently. In fact the wound appears to be about half the size of what it was last week and overall I am extremely pleased with where things stand. Fortunately there is no signs of active infection at this time. No fevers, chills, nausea, vomiting, or diarrhea. 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 We have been using Hydrofera Blue under 3 layer compression Objective Constitutional Vitals Time Taken: 10:05 AM, Height: 65 in, Weight: 155 lbs, BMI: 25.8, Temperature: 98.0 F, Pulse: 55 bpm, Respiratory Rate: 18 breaths/min, Blood Pressure: 106/65 mmHg. Integumentary (Hair, Skin) Wound #1 status is Open. Original cause of wound was Trauma. The date acquired was: 03/09/2020. The wound has been in treatment 2 weeks. The wound is located on the Left,Medial Lower Leg. The wound measures 0.6cm length x 0.7cm width x 0.1cm depth; 0.33cm^2 area and 0.033cm^3 volume. There is Fat Layer (Subcutaneous Tissue) exposed. There is no tunneling or undermining noted. There is a small amount of serous drainage noted. There is large (67-100%) red, pink granulation within the wound bed. There is no necrotic tissue within the wound bed. Assessment Active Problems ICD-10 Venous  insufficiency (chronic) (peripheral) Non-pressure chronic ulcer of other part of left lower leg with fat layer exposed [place] (primary) hypertension Atherosclerotic heart disease of native coronary artery without angina pectoris Hypothermia, not associated with low environmental temperature Procedures Wound #1 Pre-procedure diagnosis of Wound #1 is a Venous Leg Ulcer located on the Left,Medial Lower Leg . There was a Three Layer Compression Therapy Bosler, Zebulun S. ([id number]) Procedure with a pre-treatment ABI of 0.7 by Cornell Barman, RN. Post procedure Diagnosis Wound #1: Same as Pre-Procedure Plan Follow-up Appointments: Return Appointment in 1 week. Bathing/ Shower/ Hygiene: May shower with wound dressing protected with water repellent cover or cast protector. No tub bath. Edema Control - Lymphedema / Segmental Compressive Device / Other: Optional: One layer of unna paste to top of compression wrap (to act as an anchor). Elevate, Exercise Daily and Avoid Standing for Long Periods of Time. Elevate legs to the level of the heart and pump ankles as often as possible Elevate leg(s) parallel to the floor when sitting. WOUND #1: - Lower Leg Wound Laterality: Left, Medial Cleanser: Normal Saline 1 x Per Week/30 Days Discharge Instructions: Wash your hands with soap and water. Remove old dressing, discard into plastic bag and place into trash. Cleanse the wound with Normal Saline prior to applying a clean dressing using gauze sponges, not tissues or cotton balls. Do not scrub or use excessive force. Pat dry using gauze sponges, not tissue or cotton balls. Peri-Wound Care: Triamcinolone Acetonide Cream, 0.1%, 15 (g) tube 1 x Per Week/30 Days Primary Dressing: Hydrofera Blue Ready Transfer Foam, 2.5x2.5 (in/in) 1 x Per Week/30 Days Discharge Instructions: Apply Hydrofera Blue Ready to wound bed as directed Secondary Dressing: ABD Pad 5x9 (in/in) 1 x Per Week/30 Days Discharge Instructions:  Cover with ABD pad Compression Wrap: Profore Lite LF 3 Multilayer Compression Bandaging System 1 x Per Week/30 Days Discharge Instructions: Apply 3 multi-layer wrap as prescribed. 1. Continue with Hydrofera  Blue ABDs under 3 layer compression. 2. The patient continues to make nice progress Electronic Signature(s) Signed: 08/25/2020 8:16:57 AM By: Linton Ham MD Entered By: Linton Ham on 08/24/2020 11:59:06 Robb, Crystal S. ([id number]) -------------------------------------------------------------------------------- SuperBill Details Patient Name: [name], Glynn S. Date of Service: 08/24/2020 Medical Record Number: [id number] Patient Account Number: [id number] Date of Birth/Sex: [birth year] (60 y.o. M) Treating RN: Cornell Barman Primary Care Provider: Jill Alexanders Other Clinician: Jeanine Luz Referring Provider: Jill Alexanders Treating Provider/Extender: Tito Dine in Treatment: 2 Diagnosis Coding ICD-10 Codes Code Description I87.2 Venous insufficiency (chronic) (peripheral) L97.822 Non-pressure chronic ulcer of other part of left lower leg with fat layer exposed [place] (primary) hypertension I25.10 Atherosclerotic heart disease of native coronary artery without angina pectoris R68.0 Hypothermia, not associated with low environmental temperature Facility Procedures CPT4 Code: [id number] Description: (Facility Use Only) [phone] - [facility] LWR LT LEG Modifier: Quantity: 1 Physician Procedures CPT4 Code: [id number] Description: 15615 - WC PHYS LEVEL 3 - EST PT Modifier: Quantity: 1 CPT4 Code: Description: ICD-10 Diagnosis Description L97.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

## 2020-08-31 ENCOUNTER — Other Ambulatory Visit: Payer: Self-pay

## 2020-08-31 ENCOUNTER — Encounter: Payer: BC Managed Care – PPO | Attending: Internal Medicine | Admitting: Internal Medicine

## 2020-08-31 DIAGNOSIS — I10 Essential (primary) hypertension: Secondary | ICD-10-CM | POA: Diagnosis not present

## 2020-08-31 DIAGNOSIS — I872 Venous insufficiency (chronic) (peripheral): Secondary | ICD-10-CM | POA: Insufficient documentation

## 2020-08-31 DIAGNOSIS — D89811 Chronic graft-versus-host disease: Secondary | ICD-10-CM | POA: Insufficient documentation

## 2020-08-31 DIAGNOSIS — L97822 Non-pressure chronic ulcer of other part of left lower leg with fat layer exposed: Secondary | ICD-10-CM | POA: Diagnosis not present

## 2020-08-31 DIAGNOSIS — I890 Lymphedema, not elsewhere classified: Secondary | ICD-10-CM | POA: Insufficient documentation

## 2020-09-01 NOTE — Progress Notes (Signed)
[name], [name] ([id number]) Visit Report for 08/31/2020 HPI Details Patient Name: Tyler, Li. Date of Service: 08/31/2020 11:00 AM Medical Record Number: [id number] Patient Account Number: [id number] Date of Birth/Sex: Jan 26, 1961 (60 y.o. M) Treating RN: Cornell Barman Primary Care Provider: Jill Alexanders Other Clinician: Jeanine Luz Referring Provider: Jill Alexanders Treating Provider/Extender: [name] in Treatment: 3 History of Present Illness HPI Description: 08/09/2020 upon evaluation today patient appears to be doing somewhat poorly in regard to his left lower extremity. Subsequently he on January 14 was seen by the [facility] wound care center. It appears that they actually put him in a 4-layer compression wrap along with [facility] he tells me that felt pretty good but again that is quite a drive for him to get there. He mainly was being seen there is that is where his cancer doctor is. With that being said the patient tells me currently that he is having some discomfort at the site but is not terrible. He has been tolerating the dressing changes without complication which is great news. 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 He does seem to have potentially some issues with regulating his temperature which is consistent with this process. We saw that today as well with his low vital sign temperature reading. The initial injury actually occurred when the patient dropped asphalt which landed on the anterior portion of his leg causing the injury. The good news is this does not appear to be too deep which hopefully should bode well for rapid healing. 08/16/2020 on evaluation today patient appears to be doing well with regard to his wounds currently. In fact the wound appears to be about half the size of what it was last week and overall I am extremely pleased with where things stand. Fortunately there  is no signs of active infection at this time. No fevers, chills, nausea, vomiting, or diarrhea. 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 We have been using Hydrofera Blue under 3 layer compression 4/6; continued improvement using Hydrofera Blue under 3 layer compression Electronic Signature(s) Signed: 09/01/2020 7:59:07 AM By: Linton Ham MD Entered By: Linton Ham on 09/01/2020 07:53:16 Li, [name] ([id number]) -------------------------------------------------------------------------------- Physical Exam Details Patient Name: [name], [name] Date of Service: 08/31/2020 11:00 AM Medical Record Number: [id number] Patient Account Number: [id number] Date of Birth/Sex: Dec 03, 1960 (60 y.o. M) Treating RN: Cornell Barman Primary Care Provider: Jill Alexanders Other Clinician: Jeanine Luz Referring Provider: Jill Alexanders Treating Provider/Extender: [name] in Treatment: 3 Constitutional Sitting or standing Blood Pressure is within target range for patient.. Pulse regular and within target range for patient.Marland Kitchen Respirations regular, non- labored and within target range.. Temperature is normal and within the target range for the patient.Marland Kitchen appears in no distress. Notes Wound exam; continued improvement in surface area. Surface of the wound looks healthy. No debridement is required. He has skin changes of chronic venous insufficiency in this area with hemosiderin deposition. There is no evidence of surrounding infection Electronic Signature(s) Signed: 09/01/2020 7:59:07 AM By: Linton Ham MD Entered By: Linton Ham on 09/01/2020 07:55:45 Li, [name] ([id number]) -------------------------------------------------------------------------------- Physician Orders Details Patient Name: [name], [name] Date of Service: 08/31/2020 11:00 AM Medical Record Number: [id number] Patient Account Number: [id number] Date of Birth/Sex:  05/21/1961 (60 y.o. M) Treating RN: Cornell Barman Primary Care Provider: Jill Alexanders Other Clinician: Jeanine Luz Referring Provider: [place] Li,  Tyler Treating Provider/Extender: [name] in Treatment: 3 Verbal / Phone Orders: No Diagnosis Coding Follow-up Appointments o Return Appointment in 1 week. Bathing/ Shower/ Hygiene o May shower with wound dressing protected with water repellent cover or cast protector. o No tub bath. Edema Control - Lymphedema / Segmental Compressive Device / Other Left Lower Extremity o Optional: One layer of unna paste to top of compression wrap (to act as an anchor). o Elevate, Exercise Daily and Avoid Standing for Long Periods of Time. o Elevate legs to the level of the heart and pump ankles as often as possible o Elevate leg(s) parallel to the floor when sitting. Wound Treatment Wound #1 - Lower Leg Wound Laterality: Left, Medial Cleanser: Normal Saline 1 x Per Week/30 Days Discharge Instructions: Wash your hands with soap and water. Remove old dressing, discard into plastic bag and place into trash. Cleanse the wound with Normal Saline prior to applying a clean dressing using gauze sponges, not tissues or cotton balls. Do not scrub or use excessive force. Pat dry using gauze sponges, not tissue or cotton balls. Peri-Wound Care: Triamcinolone Acetonide Cream, 0.1%, 15 (g) tube 1 x Per Week/30 Days Primary Dressing: Hydrofera Blue Ready Transfer Foam, 2.5x2.5 (in/in) 1 x Per Week/30 Days Discharge Instructions: Apply Hydrofera Blue Ready to wound bed as directed Secondary Dressing: ABD Pad 5x9 (in/in) 1 x Per Week/30 Days Discharge Instructions: Cover with ABD pad Compression Wrap: Profore Lite LF 3 Multilayer Compression Bandaging System 1 x Per Week/30 Days Discharge Instructions: Apply 3 multi-layer wrap as prescribed. Electronic Signature(s) Signed: 08/31/2020 5:25:07 PM By: Gretta Cool, BSN, RN, CWS, Kim RN, BSN Signed:  09/01/2020 7:59:07 AM By: Linton Ham MD Entered By: Gretta Cool, BSN, RN, CWS, Kim on 08/31/2020 11:25:07 [place], Tyler SMarland Kitchen ([id number]) -------------------------------------------------------------------------------- Problem List Details Patient Name: JANUSZ, Maddock S. Date of Service: 08/31/2020 11:00 AM Medical Record Number: [id number] Patient Account Number: [id number] Date of Birth/Sex: May 20, 1961 (60 y.o. M) Treating RN: Cornell Barman Primary Care Provider: Jill Alexanders Other Clinician: Jeanine Luz Referring Provider: Jill Alexanders Treating Provider/Extender: [name] in Treatment: 3 Active Problems ICD-10 Encounter Code Description Active Date MDM Diagnosis I87.2 Venous insufficiency (chronic) (peripheral) 08/09/2020 No Yes L97.822 Non-pressure chronic ulcer of other part of left lower leg with fat layer 08/09/2020 No Yes exposed [place] (primary) hypertension 08/09/2020 No Yes I25.10 Atherosclerotic heart disease of native coronary artery without angina 08/09/2020 No Yes pectoris R68.0 Hypothermia, not associated with low environmental temperature 08/09/2020 No Yes Inactive Problems Resolved Problems Electronic Signature(s) Signed: 09/01/2020 7:59:07 AM By: Linton Ham MD Entered By: Linton Ham on 09/01/2020 07:54:22 [place], Nahsir S. ([id number]) -------------------------------------------------------------------------------- Progress Note Details Patient Name: [name], [name] Date of Service: 08/31/2020 11:00 AM Medical Record Number: [id number] Patient Account Number: [id number] Date of Birth/Sex: 1961/02/04 (60 y.o. M) Treating RN: Cornell Barman Primary Care Provider: Jill Alexanders Other Clinician: Jeanine Luz Referring Provider: Jill Alexanders Treating Provider/Extender: [name] in Treatment: 3 Subjective History of Present Illness (HPI) 08/09/2020 upon evaluation today patient appears to be doing somewhat poorly in regard to his left lower  extremity. Subsequently he on January 14 was seen by the [facility] wound care center. It appears that they actually put him in a 4-layer compression wrap along with [facility] he tells me that felt pretty good but again that is quite a drive for him to get there. He mainly was being seen there is that is where his cancer doctor is. With that being  said the patient tells me currently that he is having some discomfort at the site but is not terrible. He has been tolerating the dressing changes without complication which is great news. 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 He does seem to have potentially some issues with regulating his temperature which is consistent with this process. We saw that today as well with his low vital sign temperature reading. The initial injury actually occurred when the patient dropped asphalt which landed on the anterior portion of his leg causing the injury. The good news is this does not appear to be too deep which hopefully should bode well for rapid healing. 08/16/2020 on evaluation today patient appears to be doing well with regard to his wounds currently. In fact the wound appears to be about half the size of what it was last week and overall I am extremely pleased with where things stand. Fortunately there is no signs of active infection at this time. No fevers, chills, nausea, vomiting, or diarrhea. 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 We have been using Hydrofera Blue under 3 layer compression 4/6; continued improvement using Hydrofera Blue under 3 layer compression Objective Constitutional Sitting or standing Blood Pressure is within target range for patient.. Pulse regular and within target range for patient.Marland Kitchen Respirations regular, non- labored and within target range.. Temperature is normal and within the target range  for the patient.Marland Kitchen appears in no distress. Vitals Time Taken: 11:03 AM, Height: 65 in, Weight: 155 lbs, BMI: 25.8, Temperature: 98.2 F, Pulse: 48 bpm, Respiratory Rate: 16 breaths/min, Blood Pressure: 103/62 mmHg. General Notes: Wound exam; continued improvement in surface area. Surface of the wound looks healthy. No debridement is required. He has skin changes of chronic venous insufficiency in this area with hemosiderin deposition. There is no evidence of surrounding infection Integumentary (Hair, Skin) Wound #1 status is Open. Original cause of wound was Trauma. The date acquired was: 03/09/2020. The wound has been in treatment 3 weeks. The wound is located on the Left,Medial Lower Leg. The wound measures 0.4cm length x 0.6cm width x 0.1cm depth; 0.188cm^2 area and 0.019cm^3 volume. There is Fat Layer (Subcutaneous Tissue) exposed. There is no tunneling or undermining noted. There is a small amount of serous drainage noted. There is large (67-100%) red, pink granulation within the wound bed. There is no necrotic tissue within the wound bed. Assessment Active Problems ICD-10 Venous insufficiency (chronic) (peripheral) Non-pressure chronic ulcer of other part of left lower leg with fat layer exposed Essential (primary) hypertension Atherosclerotic heart disease of native coronary artery without angina pectoris Hypothermia, not associated with low environmental temperature [name], [name] ([id number]) Procedures Wound #1 Pre-procedure diagnosis of Wound #1 is a Venous Leg Ulcer located on the Left,Medial Lower Leg . There was a Three Layer Compression Therapy Procedure with a pre-treatment ABI of 0.7 by Cornell Barman, RN. Post procedure Diagnosis Wound #1: Same as Pre-Procedure Plan Follow-up Appointments: Return Appointment in 1 week. Bathing/ Shower/ Hygiene: May shower with wound dressing protected with water repellent cover or cast protector. No tub bath. Edema Control - Lymphedema /  Segmental Compressive Device / Other: Optional: One layer of unna paste to top of compression wrap (to act as an anchor). Elevate, Exercise Daily and Avoid Standing for Long Periods of Time. Elevate legs to the level of the heart and pump ankles as often as possible Elevate  leg(s) parallel to the floor when sitting. WOUND #1: - Lower Leg Wound Laterality: Left, Medial Cleanser: Normal Saline 1 x Per Week/30 Days Discharge Instructions: Wash your hands with soap and water. Remove old dressing, discard into plastic bag and place into trash. Cleanse the wound with Normal Saline prior to applying a clean dressing using gauze sponges, not tissues or cotton balls. Do not scrub or use excessive force. Pat dry using gauze sponges, not tissue or cotton balls. Peri-Wound Care: Triamcinolone Acetonide Cream, 0.1%, 15 (g) tube 1 x Per Week/30 Days Primary Dressing: Hydrofera Blue Ready Transfer Foam, 2.5x2.5 (in/in) 1 x Per Week/30 Days Discharge Instructions: Apply Hydrofera Blue Ready to wound bed as directed Secondary Dressing: ABD Pad 5x9 (in/in) 1 x Per Week/30 Days Discharge Instructions: Cover with ABD pad Compression Wrap: Profore Lite LF 3 Multilayer Compression Bandaging System 1 x Per Week/30 Days Discharge Instructions: Apply 3 multi-layer wrap as prescribed. 1. I saw no evidence to change the primary dressing. 2. The patient works on his feet for long hours. He is going to need compression stockings I talked to him about elastic therapy in [place] the other leg on the right does not look so bad. 3. No evidence of infection and not much in the way of edema Electronic Signature(s) Signed: 09/01/2020 7:59:07 AM By: Linton Ham MD Entered By: Linton Ham on 09/01/2020 07:58:10 [name], [name] ([id number]) -------------------------------------------------------------------------------- SuperBill Details Patient Name: [name], [name] Date of Service: 08/31/2020 Medical Record Number:  [id number] Patient Account Number: [id number] Date of Birth/Sex: 12/03/1960 (60 y.o. M) Treating RN: Cornell Barman Primary Care Provider: Jill Alexanders Other Clinician: Jeanine Luz Referring Provider: Jill Alexanders Treating Provider/Extender: [name] in Treatment: 3 Diagnosis Coding ICD-10 Codes Code Description I87.2 Venous insufficiency (chronic) (peripheral) L97.822 Non-pressure chronic ulcer of other part of left lower leg with fat layer exposed [place] (primary) hypertension I25.10 Atherosclerotic heart disease of native coronary artery without angina pectoris R68.0 Hypothermia, not associated with low environmental temperature Facility Procedures CPT4 Code: [id number] Description: (Facility Use Only) [phone] - [facility] LT LEG Modifier: Quantity: 1 Physician Procedures CPT4 Code: [id number] Description: [id number] - WC PHYS LEVEL 3 - EST PT Modifier: Quantity: 1 CPT4 Code: Description: ICD-10 Diagnosis Description L97.822 Non-pressure chronic ulcer of other part of left lower leg with fat lay I87.2 Venous insufficiency (chronic) (peripheral) Modifier: er exposed Quantity: Electronic Signature(s) Signed: 09/01/2020 7:59:07 AM By: Linton Ham MD Previous Signature: 08/31/2020 5:25:07 PM Version By: Gretta Cool, BSN, RN, CWS, Kim RN, BSN Entered By: Linton Ham on 09/01/2020 07:58:29

## 2020-09-02 NOTE — Progress Notes (Signed)
Li, Tyler ([id number]) Visit Report for 08/24/2020 Arrival Information Details Patient Name: [name], [name]. Date of Service: 08/24/2020 10:15 AM Medical Record Number: [id number] Patient Account Number: [id number] Date of Birth/Sex: [birth year] (60 y.o. M) Treating RN: Cornell Barman Primary Care Kyllian Clingerman: Jill Alexanders Other Clinician: Jeanine Luz Referring Marilena Trevathan: Jill Alexanders Treating Vergia Chea/Extender: Tito Dine in Treatment: 2 Visit Information History Since Last Visit Added or deleted any medications: No Patient Arrived: Ambulatory Had a fall or experienced change in No Arrival Time: 10:03 activities of daily living that may affect Accompanied By: self risk of falls: Transfer Assistance: None Hospitalized since last visit: No Patient Identification Verified: Yes Pain Present Now: No Secondary Verification Process Completed: Yes Patient Requires Transmission-Based No Precautions: Patient Has Alerts: Yes Patient Alerts: Patient on Blood Thinner Electronic Signature(s) Signed: 08/24/2020 4:51:14 PM By: Jeanine Luz Entered By: Jeanine Luz on 08/24/2020 10:07:36 [place], [place]. ([id number]) -------------------------------------------------------------------------------- Compression Therapy Details Patient Name: [name], [name] Date of Service: 08/24/2020 10:15 AM Medical Record Number: [id number] Patient Account Number: [id number] Date of Birth/Sex: 1960-08-29 (60 y.o. M) Treating RN: Cornell Barman Primary Care Sricharan Lacomb: Jill Alexanders Other Clinician: Jeanine Luz Referring Neeti Knudtson: Jill Alexanders Treating Sherrill Mckamie/Extender: Tito Dine in Treatment: 2 Compression Therapy Performed for Wound Assessment: Wound #1 Left,Medial Lower Leg Performed By: Clinician Cornell Barman, RN Compression Type: Three Layer Pre Treatment ABI: 0.7 Post Procedure Diagnosis Same as Pre-procedure Electronic Signature(s) Signed: 08/24/2020 1:17:06 PM By: Gretta Cool, BSN,  RN, CWS, Kim RN, BSN Entered By: Gretta Cool, BSN, RN, CWS, Kim on 08/24/2020 11:08:18 [place], Everitt Chauncey Cruel ([id number]) -------------------------------------------------------------------------------- Encounter Discharge Information Details Patient Name: [name], [name] Date of Service: 08/24/2020 10:15 AM Medical Record Number: [id number] Patient Account Number: [id number] Date of Birth/Sex: 1961-04-20 (60 y.o. M) Treating RN: Carlene Coria Primary Care Mykel Mohl: Jill Alexanders Other Clinician: Jeanine Luz Referring Javanni Maring: Jill Alexanders Treating Mariadejesus Cade/Extender: Tito Dine in Treatment: 2 Encounter Discharge Information Items Discharge Condition: Stable Ambulatory Status: Ambulatory Discharge Destination: Home Transportation: Private Auto Accompanied By: self Schedule Follow-up Appointment: Yes Clinical Summary of Care: Patient Declined Electronic Signature(s) Signed: 09/02/2020 8:11:40 AM By: Carlene Coria RN Entered By: Carlene Coria on 08/24/2020 11:22:20 [place], Jerrard S. ([id number]) -------------------------------------------------------------------------------- Lower Extremity Assessment Details Patient Name: [name], [name] Date of Service: 08/24/2020 10:15 AM Medical Record Number: [id number] Patient Account Number: [id number] Date of Birth/Sex: [birth year] (60 y.o. M) Treating RN: Cornell Barman Primary Care Mizael Sagar: Jill Alexanders Other Clinician: Jeanine Luz Referring Dimonique Bourdeau: Jill Alexanders Treating Vandana Haman/Extender: Tito Dine in Treatment: 2 Edema Assessment Assessed: [Left: No] [Right: No] [Left: Edema] [Right: :] Calf Left: Right: Point of Measurement: 33 cm From Medial Instep 32 cm Ankle Left: Right: Point of Measurement: 12 cm From Medial Instep 20.5 cm Vascular Assessment Pulses: Dorsalis Pedis Palpable: [Left:Yes] Electronic Signature(s) Signed: 08/24/2020 1:17:06 PM By: Gretta Cool, BSN, RN, CWS, Kim RN, BSN Signed: 08/24/2020 4:51:14 PM By:  Jeanine Luz Entered By: Jeanine Luz on 08/24/2020 10:19:34 [place], [place]. ([id number]) -------------------------------------------------------------------------------- Multi Wound Chart Details Patient Name: [name], [name] Date of Service: 08/24/2020 10:15 AM Medical Record Number: [id number] Patient Account Number: [id number] Date of Birth/Sex: 1960/09/26 (60 y.o. M) Treating RN: Cornell Barman Primary Care Daielle Melcher: Jill Alexanders Other Clinician: Jeanine Luz Referring Laveta Gilkey: Jill Alexanders Treating Nitasha Jewel/Extender: Tito Dine in Treatment: 2 Vital Signs Height(in): 65 Pulse(bpm): 57 Weight(lbs): 155 Blood Pressure(mmHg): 106/65 Body Mass Index(BMI): 26 Temperature(F): 98.0 Respiratory Rate(breaths/min): 18 Photos: [N/A:N/A] Wound Location: Left, Medial Lower Leg  N/A N/A Wounding Event: Trauma N/A N/A Primary Etiology: Venous Leg Ulcer N/A N/A Comorbid History: Glaucoma, Coronary Artery N/A N/A Disease, Hypertension, Neuropathy, Received Chemotherapy Date Acquired: 03/09/2020 N/A N/A Weeks of Treatment: 2 N/A N/A Wound Status: Open N/A N/A Clustered Wound: Yes N/A N/A Measurements L x W x D (cm) 0.6x0.7x0.1 N/A N/A Area (cm) : 0.33 N/A N/A Volume (cm) : 0.033 N/A N/A % Reduction in Area: 94.60% N/A N/A % Reduction in Volume: 94.60% N/A N/A Classification: Full Thickness Without Exposed N/A N/A Support Structures Exudate Amount: Small N/A N/A Exudate Type: Serous N/A N/A Exudate Color: amber N/A N/A Granulation Amount: Large (67-100%) N/A N/A Granulation Quality: Red, Pink N/A N/A Necrotic Amount: None Present (0%) N/A N/A Exposed Structures: Fat Layer (Subcutaneous Tissue): N/A N/A Yes Fascia: No Tendon: No Muscle: No Joint: No Bone: No Epithelialization: None N/A N/A Treatment Notes Electronic Signature(s) Signed: 08/24/2020 1:17:06 PM By: Gretta Cool, BSN, RN, CWS, Kim RN, BSN Entered By: Gretta Cool, BSN, RN, CWS, Kim on 08/24/2020  11:07:54 [place], RobertsonMarland Kitchen ([id number]) -------------------------------------------------------------------------------- Multi-Disciplinary Care Plan Details Patient Name: [name], Jaeveon S. Date of Service: 08/24/2020 10:15 AM Medical Record Number: [id number] Patient Account Number: [id number] Date of Birth/Sex: 06-Mar-1961 (60 y.o. M) Treating RN: Cornell Barman Primary Care Kyo Cocuzza: Jill Alexanders Other Clinician: Jeanine Luz Referring Hayven Croy: Jill Alexanders Treating Preslea Rhodus/Extender: Tito Dine in Treatment: 2 Active Inactive Orientation to the Wound Care Program Nursing Diagnoses: Knowledge deficit related to the wound healing center program Goals: Patient/caregiver will verbalize understanding of the [facility] Date Initiated: 08/09/2020 Target Resolution Date: 08/09/2020 Goal Status: Active Interventions: Provide education on orientation to the wound center Notes: Wound/Skin Impairment Nursing Diagnoses: Impaired tissue integrity Goals: Patient/caregiver will verbalize understanding of skin care regimen Date Initiated: 08/09/2020 Target Resolution Date: 08/09/2020 Goal Status: Active Ulcer/skin breakdown will have a volume reduction of 30% by week 4 Date Initiated: 08/09/2020 Target Resolution Date: 09/09/2020 Goal Status: Active Ulcer/skin breakdown will have a volume reduction of 50% by week 8 Date Initiated: 08/09/2020 Target Resolution Date: 10/09/2020 Goal Status: Active Ulcer/skin breakdown will have a volume reduction of 80% by week 12 Date Initiated: 08/09/2020 Target Resolution Date: 11/09/2020 Goal Status: Active Ulcer/skin breakdown will heal within 14 weeks Date Initiated: 08/09/2020 Target Resolution Date: 12/09/2020 Goal Status: Active Interventions: Assess patient/caregiver ability to obtain necessary supplies Assess patient/caregiver ability to perform ulcer/skin care regimen upon admission and as needed Assess ulceration(s) every  visit Provide education on ulcer and skin care Treatment Activities: Skin care regimen initiated : 08/09/2020 Notes: Electronic Signature(s) Signed: 08/24/2020 1:17:06 PM By: Gretta Cool, BSN, RN, CWS, Kim RN, BSN Entered By: Gretta Cool, BSN, RN, CWS, Kim on 08/24/2020 11:07:42 [place], [place] S. ([id number]) -------------------------------------------------------------------------------- Pain Assessment Details Patient Name: [name], Michell S. Date of Service: 08/24/2020 10:15 AM Medical Record Number: [id number] Patient Account Number: [id number] Date of Birth/Sex: 21-Feb-1961 (60 y.o. M) Treating RN: Cornell Barman Primary Care Deneisha Dade: Jill Alexanders Other Clinician: Jeanine Luz Referring Duaa Stelzner: Jill Alexanders Treating Deklin Bieler/Extender: Tito Dine in Treatment: 2 Active Problems Location of Pain Severity and Description of Pain Patient Has Paino No Site Locations Rate the pain. Current Pain Level: 0 Pain Management and Medication Current Pain Management: Electronic Signature(s) Signed: 08/24/2020 1:17:06 PM By: Gretta Cool, BSN, RN, CWS, Kim RN, BSN Signed: 08/24/2020 4:51:14 PM By: Jeanine Luz Entered By: Jeanine Luz on 08/24/2020 10:09:50 [place], [place]. ([id number]) -------------------------------------------------------------------------------- Patient/Caregiver Education Details Patient Name: DREISBACH, Dajohn S. Date of Service: 08/24/2020 10:15 AM Medical  Record Number: [id number] Patient Account Number: [id number] Date of Birth/Gender: December 11, 1960 (60 y.o. M) Treating RN: Cornell Barman Primary Care Physician: Jill Alexanders Other Clinician: Jeanine Luz Referring Physician: Jill Alexanders Treating Physician/Extender: Tito Dine in Treatment: 2 Education Assessment Education Provided To: Patient Education Topics Provided Wound/Skin Impairment: Handouts: Caring for Your Ulcer Methods: Demonstration, Explain/Verbal Responses: State content correctly Electronic  Signature(s) Signed: 08/24/2020 1:17:06 PM By: Gretta Cool, BSN, RN, CWS, Kim RN, BSN Entered By: Gretta Cool, BSN, RN, CWS, Kim on 08/24/2020 11:10:05 [place], [place]. ([id number]) -------------------------------------------------------------------------------- Wound Assessment Details Patient Name: [name], Sylvestre S. Date of Service: 08/24/2020 10:15 AM Medical Record Number: [id number] Patient Account Number: [id number] Date of Birth/Sex: [birth year] (60 y.o. M) Treating RN: Cornell Barman Primary Care Chanley Mcenery: Jill Alexanders Other Clinician: Jeanine Luz Referring Asusena Sigley: Jill Alexanders Treating Cassiopeia Florentino/Extender: Tito Dine in Treatment: 2 Wound Status Wound Number: 1 Primary Venous Leg Ulcer Etiology: Wound Location: Left, Medial Lower Leg Wound Open Wounding Event: Trauma Status: Date Acquired: 03/09/2020 Comorbid Glaucoma, Coronary Artery Disease, Hypertension, Weeks Of Treatment: 2 History: Neuropathy, Received Chemotherapy Clustered Wound: Yes Photos Wound Measurements Length: (cm) 0.6 Width: (cm) 0.7 Depth: (cm) 0.1 Area: (cm) 0.33 Volume: (cm) 0.033 % Reduction in Area: 94.6% % Reduction in Volume: 94.6% Epithelialization: None Tunneling: No Undermining: No Wound Description Classification: Full Thickness Without Exposed Support Structu Exudate Amount: Small Exudate Type: Serous Exudate Color: amber res Foul Odor After Cleansing: No Slough/Fibrino No Wound Bed Granulation Amount: Large (67-100%) Exposed Structure Granulation Quality: Red, Pink Fascia Exposed: No Necrotic Amount: None Present (0%) Fat Layer (Subcutaneous Tissue) Exposed: Yes Tendon Exposed: No Muscle Exposed: No Joint Exposed: No Bone Exposed: No Electronic Signature(s) Signed: 08/24/2020 1:17:06 PM By: Gretta Cool, BSN, RN, CWS, Kim RN, BSN Signed: 08/24/2020 4:51:14 PM By: Jeanine Luz Entered By: Jeanine Luz on 08/24/2020 10:17:57 [place], Mahdi S.  ([id number]) -------------------------------------------------------------------------------- Vitals Details Patient Name: [name], Garcia S. Date of Service: 08/24/2020 10:15 AM Medical Record Number: [id number] Patient Account Number: [id number] Date of Birth/Sex: 1960/09/08 (60 y.o. M) Treating RN: Cornell Barman Primary Care Thurman Sarver: Jill Alexanders Other Clinician: Jeanine Luz Referring Romel Dumond: Jill Alexanders Treating Deandria Klute/Extender: Tito Dine in Treatment: 2 Vital Signs Time Taken: 10:05 Temperature (F): 98.0 Height (in): 65 Pulse (bpm): 55 Weight (lbs): 155 Respiratory Rate (breaths/min): 18 Body Mass Index (BMI): 25.8 Blood Pressure (mmHg): 106/65 Reference Range: 80 - 120 mg / dl Electronic Signature(s) Signed: 08/24/2020 4:51:14 PM By: Jeanine Luz Entered By: Jeanine Luz on 08/24/2020 10:09:21

## 2020-09-02 NOTE — Progress Notes (Signed)
Tyler, Li ([id number]) Visit Report for 08/31/2020 Arrival Information Details Patient Name: Tyler, Li. Date of Service: 08/31/2020 11:00 AM Medical Record Number: [id number] Patient Account Number: [id number] Date of Birth/Sex: [birth year] (60 y.o. M) Treating RN: [name] Primary Care Raydel Hosick: Jill Alexanders Other Clinician: Jeanine Luz Referring Jase Himmelberger: Jill Alexanders Treating [name]/Extender: [name] in Treatment: 3 Visit Information History Since Last Visit Added or deleted any medications: No Patient Arrived: Ambulatory Had a fall or experienced change in No Arrival Time: 11:04 activities of daily living that may affect Accompanied By: self risk of falls: Transfer Assistance: None Hospitalized since last visit: No Patient Identification Verified: Yes Has Dressing in Place as Prescribed: Yes Secondary Verification Process Completed: Yes Pain Present Now: No Patient Requires Transmission-Based No Precautions: Patient Has Alerts: Yes Patient Alerts: Patient on Blood Thinner Electronic Signature(Li) Signed: 08/31/2020 4:51:02 PM By: [name] Entered By: [name] on 08/31/2020 11:04:45 [place], [place]. ([id number]) -------------------------------------------------------------------------------- Compression Therapy Details Patient Name: [name], [name]. Date of Service: 08/31/2020 11:00 AM Medical Record Number: [id number] Patient Account Number: [id number] Date of Birth/Sex: 12-08-1960 (60 y.o. M) Treating RN: [name] Primary Care Karon Heckendorn: Jill Alexanders Other Clinician: Jeanine Luz Referring Thresia Ramanathan: Jill Alexanders Treating Myka Lukins/Extender: [name] in Treatment: 3 Compression Therapy Performed for Wound Assessment: Wound #1 Left,Medial Lower Leg Performed By: Clinician Tyler Barman, RN Compression Type: Three Layer Pre Treatment ABI: 0.7 Post Procedure Diagnosis Same as Pre-procedure Electronic Signature(Li) Signed:  08/31/2020 5:25:07 PM By: Gretta Cool, BSN, RN, CWS, Kim RN, BSN Entered By: Gretta Cool, BSN, RN, CWS, Kim on 08/31/2020 11:22:21 [name], [name] ([id number]) -------------------------------------------------------------------------------- Encounter Discharge Information Details Patient Name: [name], [name]. Date of Service: 08/31/2020 11:00 AM Medical Record Number: [id number] Patient Account Number: [id number] Date of Birth/Sex: [birth year] (60 y.o. M) Treating RN: [name] Primary Care Hughey Rittenberry: Jill Alexanders Other Clinician: Jeanine Luz Referring Siddhant Hashemi: Jill Alexanders Treating Teola Felipe/Extender: [name] in Treatment: 3 Encounter Discharge Information Items Discharge Condition: Stable Ambulatory Status: Ambulatory Discharge Destination: Home Transportation: Private Auto Accompanied By: self Schedule Follow-up Appointment: Yes Clinical Summary of Care: Electronic Signature(Li) Signed: 09/02/2020 8:10:37 AM By: Tyler Coria RN Entered By: [name] on 08/31/2020 11:45:11 [name], [name]. ([id number]) -------------------------------------------------------------------------------- Lower Extremity Assessment Details Patient Name: [name], [name]. Date of Service: 08/31/2020 11:00 AM Medical Record Number: [id number] Patient Account Number: [id number] Date of Birth/Sex: [birth year] (60 y.o. M) Treating RN: [name] Primary Care Trevia Nop: Jill Alexanders Other Clinician: Jeanine Luz Referring Arn Mcomber: Jill Alexanders Treating Aliegha Paullin/Extender: [name] in Treatment: 3 Edema Assessment Assessed: [Left: Yes] [Right: No] [Left: Edema] [Right: :] Calf Left: Right: Point of Measurement: 33 cm From Medial Instep 32 cm Ankle Left: Right: Point of Measurement: 12 cm From Medial Instep 20 cm Knee To Floor Left: Right: From Medial Instep 40 cm Vascular Assessment Pulses: Dorsalis Pedis Palpable: [Left:Yes] Electronic Signature(Li) Signed: 08/31/2020 4:51:02 PM By:  [name] Signed: 08/31/2020 5:25:07 PM By: Gretta Cool, BSN, RN, CWS, Kim RN, BSN Entered By: Gretta Cool, BSN, RN, CWS, Kim on 08/31/2020 11:25:57 [name], [name]. ([id number]) -------------------------------------------------------------------------------- Multi Wound Chart Details Patient Name: [name], [name]. Date of Service: 08/31/2020 11:00 AM Medical Record Number: [id number] Patient Account Number: [id number] Date of Birth/Sex: 1960/07/07 (60 y.o. M) Treating RN: [name] Primary Care Kamiah Fite: Jill Alexanders Other Clinician: Jeanine Luz Referring Maddisen Vought: Jill Alexanders Treating Jeannine Pennisi/Extender: [name] in Treatment: 3 Vital Signs Height(in): 65 Pulse(bpm): 48 Weight(lbs): 155 Blood Pressure(mmHg):  103/62 Body Mass Index(BMI): 26 Temperature(F): 98.2 Respiratory Rate(breaths/min): 16 Photos: [N/A:N/A] Wound Location: Left, Medial Lower Leg N/A N/A Wounding Event: Trauma N/A N/A Primary Etiology: Venous Leg Ulcer N/A N/A Comorbid History: Glaucoma, Coronary Artery N/A N/A Disease, Hypertension, Neuropathy, Received Chemotherapy Date Acquired: 03/09/2020 N/A N/A Weeks of Treatment: 3 N/A N/A Wound Status: Open N/A N/A Clustered Wound: Yes N/A N/A Measurements L x W x D (cm) 0.4x0.6x0.1 N/A N/A Area (cm) : 0.188 N/A N/A Volume (cm) : 0.019 N/A N/A % Reduction in Area: 96.90% N/A N/A % Reduction in Volume: 96.90% N/A N/A Classification: Full Thickness Without Exposed N/A N/A Support Structures Exudate Amount: Small N/A N/A Exudate Type: Serous N/A N/A Exudate Color: amber N/A N/A Granulation Amount: Large (67-100%) N/A N/A Granulation Quality: Red, Pink N/A N/A Necrotic Amount: None Present (0%) N/A N/A Exposed Structures: Fat Layer (Subcutaneous Tissue): N/A N/A Yes Fascia: No Tendon: No Muscle: No Joint: No Bone: No Epithelialization: None N/A N/A Procedures Performed: Compression Therapy N/A N/A Treatment Notes Wound #1 (Lower Leg) Wound  Laterality: Left, Medial Cleanser Normal Saline Discharge Instruction: Wash your hands with soap and water. Remove old dressing, discard into plastic bag and place into trash. Cleanse the wound with Normal Saline prior to applying a clean dressing using gauze sponges, not tissues or cotton balls. Do not Happe, Cyler Li. ([id number]) scrub or use excessive force. Pat dry using gauze sponges, not tissue or cotton balls. Peri-Wound Care Triamcinolone Acetonide Cream, 0.1%, 15 (g) tube Topical Primary Dressing Hydrofera Blue Ready Transfer Foam, 2.5x2.5 (in/in) Discharge Instruction: Apply Hydrofera Blue Ready to wound bed as directed Secondary Dressing ABD Pad 5x9 (in/in) Discharge Instruction: Cover with ABD pad Secured With Compression Wrap Profore Lite LF 3 Multilayer Compression [place] Discharge Instruction: Apply 3 multi-layer wrap as prescribed. Compression Stockings Add-Ons Electronic Signature(Li) Signed: 09/01/2020 7:59:07 AM By: Linton Ham MD Previous Signature: 08/31/2020 5:25:07 PM Version By: Gretta Cool, BSN, RN, CWS, Kim RN, BSN Entered By: Linton Ham on 09/01/2020 07:54:38 Forget, [name] Kitchen ([id number]) -------------------------------------------------------------------------------- Multi-Disciplinary Care Plan Details Patient Name: HOLTE, Demetris Li. Date of Service: 08/31/2020 11:00 AM Medical Record Number: [id number] Patient Account Number: [id number] Date of Birth/Sex: [birth year] (60 y.o. M) Treating RN: [name] Primary Care Monice Lundy: Jill Alexanders Other Clinician: Jeanine Luz Referring Vilas Edgerly: Jill Alexanders Treating Blythe Hartshorn/Extender: [name] in Treatment: 3 Active Inactive Orientation to the Wound Care Program Nursing Diagnoses: Knowledge deficit related to the wound healing center program Goals: Patient/caregiver will verbalize understanding of the [place] Program Date Initiated: 08/09/2020 Target Resolution Date:  08/09/2020 Goal Status: Active Interventions: Provide education on orientation to the wound center Notes: Wound/Skin Impairment Nursing Diagnoses: Impaired tissue integrity Goals: Patient/caregiver will verbalize understanding of skin care regimen Date Initiated: 08/09/2020 Target Resolution Date: 08/09/2020 Goal Status: Active Ulcer/skin breakdown will have a volume reduction of 30% by week 4 Date Initiated: 08/09/2020 Target Resolution Date: 09/09/2020 Goal Status: Active Ulcer/skin breakdown will have a volume reduction of 50% by week 8 Date Initiated: 08/09/2020 Target Resolution Date: 10/09/2020 Goal Status: Active Ulcer/skin breakdown will have a volume reduction of 80% by week 12 Date Initiated: 08/09/2020 Target Resolution Date: 11/09/2020 Goal Status: Active Ulcer/skin breakdown will heal within 14 weeks Date Initiated: 08/09/2020 Target Resolution Date: 12/09/2020 Goal Status: Active Interventions: Assess patient/caregiver ability to obtain necessary supplies Assess patient/caregiver ability to perform ulcer/skin care regimen upon admission and as needed Assess ulceration(Li) every visit Provide education on ulcer and skin care Treatment Activities: Skin  care regimen initiated : 08/09/2020 Notes: Electronic Signature(Li) Signed: 08/31/2020 5:25:07 PM By: Gretta Cool, BSN, RN, CWS, Kim RN, BSN Entered By: Gretta Cool, BSN, RN, CWS, Kim on 08/31/2020 11:21:37 [place], [name] Kitchen ([id number]) -------------------------------------------------------------------------------- Pain Assessment Details Patient Name: SIEDLECKI, [name]. Date of Service: 08/31/2020 11:00 AM Medical Record Number: [id number] Patient Account Number: [id number] Date of Birth/Sex: 08-27-1960 (60 y.o. M) Treating RN: [name] Primary Care Gwyn Mehring: Jill Alexanders Other Clinician: Jeanine Luz Referring Cheryn Lundquist: Jill Alexanders Treating Kashius Dominic/Extender: [name] in Treatment: 3 Active Problems Location of Pain  Severity and Description of Pain Patient Has Paino No Site Locations Rate the pain. Current Pain Level: 0 Pain Management and Medication Current Pain Management: Electronic Signature(Li) Signed: 08/31/2020 4:51:02 PM By: [name] Entered By: [name] on 08/31/2020 11:08:48 Konitzer, Teague Li. ([id number]) -------------------------------------------------------------------------------- Patient/Caregiver Education Details Patient Name: [name], Elchanan Li. Date of Service: 08/31/2020 11:00 AM Medical Record Number: [id number] Patient Account Number: [id number] Date of Birth/Gender: May 26, 1961 (60 y.o. M) Treating RN: [name] Primary Care Physician: Jill Alexanders Other Clinician: Jeanine Luz Referring Physician: Jill Alexanders Treating Physician/Extender: [name] in Treatment: 3 Education Assessment Education Provided To: Patient Education Topics Provided Venous: Handouts: Controlling Swelling with Multilayered Compression Wraps, Other: Gave measurements for compression stockings Methods: Demonstration, Explain/Verbal Responses: State content correctly Wound/Skin Impairment: Handouts: Caring for Your Ulcer Methods: Demonstration, Explain/Verbal Responses: State content correctly Electronic Signature(Li) Signed: 08/31/2020 5:25:07 PM By: Gretta Cool, BSN, RN, CWS, Kim RN, BSN Entered By: Gretta Cool, BSN, RN, CWS, Kim on 08/31/2020 11:27:34 [place], [name]. ([id number]) -------------------------------------------------------------------------------- Wound Assessment Details Patient Name: [name], [name]. Date of Service: 08/31/2020 11:00 AM Medical Record Number: [id number] Patient Account Number: [id number] Date of Birth/Sex: Mar 30, 1961 (60 y.o. M) Treating RN: [name] Primary Care Dalary Hollar: Jill Alexanders Other Clinician: Jeanine Luz Referring Dewitte Vannice: Jill Alexanders Treating Jon Kasparek/Extender: [name] in Treatment: 3 Wound Status Wound Number: 1 Primary Venous  Leg Ulcer Etiology: Wound Location: Left, Medial Lower Leg Wound Open Wounding Event: Trauma Status: Date Acquired: 03/09/2020 Comorbid Glaucoma, Coronary Artery Disease, Hypertension, Weeks Of Treatment: 3 History: Neuropathy, Received Chemotherapy Clustered Wound: Yes Photos Wound Measurements Length: (cm) 0.4 Width: (cm) 0.6 Depth: (cm) 0.1 Area: (cm) 0.188 Volume: (cm) 0.019 % Reduction in Area: 96.9% % Reduction in Volume: 96.9% Epithelialization: None Tunneling: No Undermining: No Wound Description Classification: Full Thickness Without Exposed Support Structures Exudate Amount: Small Exudate Type: Serous Exudate Color: amber Foul Odor After Cleansing: No Slough/Fibrino No Wound Bed Granulation Amount: Large (67-100%) Exposed Structure Granulation Quality: Red, Pink Fascia Exposed: No Necrotic Amount: None Present (0%) Fat Layer (Subcutaneous Tissue) Exposed: Yes Tendon Exposed: No Muscle Exposed: No Joint Exposed: No Bone Exposed: No Treatment Notes Wound #1 (Lower Leg) Wound Laterality: Left, Medial Cleanser Normal Saline Discharge Instruction: Wash your hands with soap and water. Remove old dressing, discard into plastic bag and place into trash. Cleanse the wound with Normal Saline prior to applying a clean dressing using gauze sponges, not tissues or cotton balls. Do not scrub or use excessive force. Pat dry using gauze sponges, not tissue or cotton balls. [place]y, [name]. ([id number]) Peri-Wound Care Triamcinolone Acetonide Cream, 0.1%, 15 (g) tube Topical Primary Dressing Hydrofera Blue Ready Transfer Foam, 2.5x2.5 (in/in) Discharge Instruction: Apply Hydrofera Blue Ready to wound bed as directed Secondary Dressing ABD Pad 5x9 (in/in) Discharge Instruction: Cover with ABD pad Secured With Compression Wrap Profore Lite LF 3 Multilayer Compression [place] Discharge Instruction: Apply 3 multi-layer wrap as  prescribed. Compression  Stockings Add-Ons Electronic Signature(Li) Signed: 08/31/2020 4:51:02 PM By: [name] Entered By: [name] on 08/31/2020 11:14:24 [place], [name]. ([id number]) -------------------------------------------------------------------------------- Vitals Details Patient Name: [name], [name]. Date of Service: 08/31/2020 11:00 AM Medical Record Number: [id number] Patient Account Number: [id number] Date of Birth/Sex: 1960-12-14 (60 y.o. M) Treating RN: [name] Primary Care Tagen Brethauer: Jill Alexanders Other Clinician: Jeanine Luz Referring Sequan Auxier: Jill Alexanders Treating Everlynn Sagun/Extender: [name] in Treatment: 3 Vital Signs Time Taken: 11:03 Temperature (F): 98.2 Height (in): 65 Pulse (bpm): 48 Weight (lbs): 155 Respiratory Rate (breaths/min): 16 Body Mass Index (BMI): 25.8 Blood Pressure (mmHg): 103/62 Reference Range: 80 - 120 mg / dl Electronic Signature(Li) Signed: 08/31/2020 4:51:02 PM By: [name] Entered ByDonnamarie Li on 08/31/2020 11:08:03

## 2020-09-06 ENCOUNTER — Ambulatory Visit: Payer: BC Managed Care – PPO | Admitting: Physician Assistant

## 2020-09-09 ENCOUNTER — Encounter: Payer: BC Managed Care – PPO | Admitting: Physician Assistant

## 2020-09-09 ENCOUNTER — Other Ambulatory Visit: Payer: Self-pay

## 2020-09-09 DIAGNOSIS — I890 Lymphedema, not elsewhere classified: Secondary | ICD-10-CM | POA: Diagnosis not present

## 2020-09-09 DIAGNOSIS — I872 Venous insufficiency (chronic) (peripheral): Secondary | ICD-10-CM | POA: Diagnosis not present

## 2020-09-09 DIAGNOSIS — D89811 Chronic graft-versus-host disease: Secondary | ICD-10-CM | POA: Diagnosis not present

## 2020-09-09 DIAGNOSIS — I10 Essential (primary) hypertension: Secondary | ICD-10-CM | POA: Diagnosis not present

## 2020-09-09 DIAGNOSIS — L97822 Non-pressure chronic ulcer of other part of left lower leg with fat layer exposed: Secondary | ICD-10-CM | POA: Diagnosis not present

## 2020-09-09 NOTE — Progress Notes (Addendum)
TREYSON, AXEL ([id number]) Visit Report for 09/09/2020 Chief Complaint Document Details Patient Name: [name], Tyler Flynt S. Date of Service: 09/09/2020 8:00 AM Medical Record Number: [id number] Patient Account Number: [id number] Date of Birth/Sex: [birth year] (60 y.o. M) Treating RN: Carlene Coria Primary Care Provider: Jill Alexanders Other Clinician: Referring Provider: Jill Alexanders Treating Provider/Extender: Skipper Cliche in Treatment: 4 Information Obtained from: Patient Chief Complaint Left LE Ulcer Electronic Signature(s) Signed: 09/09/2020 8:18:07 AM By: Worthy Keeler PA-C Entered By: Worthy Keeler on 09/09/2020 08:18:07 Modisette, Seung S. ([id number]) -------------------------------------------------------------------------------- HPI Details Patient Name: [name], Jabarie S. Date of Service: 09/09/2020 8:00 AM Medical Record Number: [id number] Patient Account Number: [id number] Date of Birth/Sex: 1960/12/31 (60 y.o. M) Treating RN: Carlene Coria Primary Care Provider: Jill Alexanders Other Clinician: Referring Provider: Jill Alexanders Treating Provider/Extender: Skipper Cliche in Treatment: 4 History of Present Illness HPI Description: 08/09/2020 upon evaluation today patient appears to be doing somewhat poorly in regard to his left lower extremity. Subsequently he on January 14 was seen by the [facility] wound care center. It appears that they actually put him in a 4-layer compression wrap along with [facility] he tells me that felt pretty good but again that is quite a drive for him to get there. He mainly was being seen there is that is where his cancer doctor is. With that being said the patient tells me currently that he is having some discomfort at the site but is not terrible. He has been tolerating the dressing changes without complication which is great news. 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 He does  seem to have potentially some issues with regulating his temperature which is consistent with this process. We saw that today as well with his low vital sign temperature reading. The initial injury actually occurred when the patient dropped asphalt which landed on the anterior portion of his leg causing the injury. The good news is this does not appear to be too deep which hopefully should bode well for rapid healing. 08/16/2020 on evaluation today patient appears to be doing well with regard to his wounds currently. In fact the wound appears to be about half the size of what it was last week and overall I am extremely pleased with where things stand. Fortunately there is no signs of active infection at this time. No fevers, chills, nausea, vomiting, or diarrhea. 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 We have been using Hydrofera Blue under 3 layer compression 4/6; continued improvement using Hydrofera Blue under 3 layer compression 09/09/2020 upon evaluation today patient appears to be doing well with regard to his wound. We have been using Hydrofera Blue which has done well but he seems to have may be stalled this a little bit I think this may be getting stuck to the wound bed is the main issue. Fortunately there does not appear to be any signs of active infection which is great news overall very pleased with where things stand today. Electronic Signature(s) Signed: 09/09/2020 8:26:03 AM By: Worthy Keeler PA-C Entered By: Worthy Keeler on 09/09/2020 08:26:03 Hollopeter, Shalin S. ([id number]) -------------------------------------------------------------------------------- Physical Exam Details Patient Name: BEBOUT, Ayaansh S. Date of Service: 09/09/2020 8:00 AM Medical Record Number: [id number] Patient Account Number: [id number] Date of Birth/Sex: 1960-06-22 (60 y.o. M) Treating RN: Carlene Coria Primary Care Provider: Jill Alexanders Other  Clinician: Referring Provider: [employer],  John Treating Provider/Extender: Jeri Cos Weeks in Treatment: 4 Constitutional Well-nourished and well-hydrated in no acute distress. Respiratory normal breathing without difficulty. Psychiatric this patient is able to make decisions and demonstrates good insight into disease process. Alert and Oriented x 3. pleasant and cooperative. Notes Upon inspection patient's wound bed actually showed signs of good granulation epithelization at this point. There does not appear to be any evidence of infection which is great news and overall very pleased with where things stand today. Electronic Signature(s) Signed: 09/09/2020 8:26:19 AM By: Worthy Keeler PA-C Entered By: Worthy Keeler on 09/09/2020 08:26:19 Whitworth, Arav S. ([id number]) -------------------------------------------------------------------------------- Physician Orders Details Patient Name: [name], Kinneth S. Date of Service: 09/09/2020 8:00 AM Medical Record Number: [id number] Patient Account Number: [id number] Date of Birth/Sex: 1960/11/26 (60 y.o. M) Treating RN: Carlene Coria Primary Care Provider: Jill Alexanders Other Clinician: Referring Provider: Jill Alexanders Treating Provider/Extender: Skipper Cliche in Treatment: 4 Verbal / Phone Orders: No Diagnosis Coding ICD-10 Coding Code Description I87.2 Venous insufficiency (chronic) (peripheral) L97.822 Non-pressure chronic ulcer of other part of left lower leg with fat layer exposed I10 Essential (primary) hypertension I25.10 Atherosclerotic heart disease of native coronary artery without angina pectoris R68.0 Hypothermia, not associated with low environmental temperature Follow-up Appointments o Return Appointment in 1 week. Bathing/ Shower/ Hygiene o May shower with wound dressing protected with water repellent cover or cast protector. o No tub bath. Edema Control - Lymphedema / Segmental Compressive Device / Other Left Lower  Extremity o Optional: One layer of unna paste to top of compression wrap (to act as an anchor). o Elevate, Exercise Daily and Avoid Standing for Long Periods of Time. o Elevate legs to the level of the heart and pump ankles as often as possible o Elevate leg(s) parallel to the floor when sitting. Wound Treatment Wound #1 - Lower Leg Wound Laterality: Left, Medial Cleanser: Normal Saline 1 x Per Week/30 Days Discharge Instructions: Wash your hands with soap and water. Remove old dressing, discard into plastic bag and place into trash. Cleanse the wound with Normal Saline prior to applying a clean dressing using gauze sponges, not tissues or cotton balls. Do not scrub or use excessive force. Pat dry using gauze sponges, not tissue or cotton balls. Peri-Wound Care: Desitin Maximum Strength Ointment 4 (oz) (Generic) 1 x Per Week/30 Days Primary Dressing: Silvercel Small 2x2 (in/in) (Generic) 1 x Per Week/30 Days Discharge Instructions: Apply Silvercel Small 2x2 (in/in) as instructed Secondary Dressing: ABD Pad 5x9 (in/in) 1 x Per Week/30 Days Discharge Instructions: Cover with ABD pad Compression Wrap: Profore Lite LF 3 Multilayer Compression Bandaging System 1 x Per Week/30 Days Discharge Instructions: Apply 3 multi-layer wrap as prescribed. Electronic Signature(s) Signed: 09/09/2020 5:20:58 PM By: Worthy Keeler PA-C Signed: 09/12/2020 7:55:11 AM By: Carlene Coria RN Entered By: Carlene Coria on 09/09/2020 08:26:13 Bahri, Diron S. ([id number]) -------------------------------------------------------------------------------- Problem List Details Patient Name: [name], Cesar S. Date of Service: 09/09/2020 8:00 AM Medical Record Number: [id number] Patient Account Number: [id number] Date of Birth/Sex: Oct 18, 1960 (60 y.o. M) Treating RN: Carlene Coria Primary Care Provider: Jill Alexanders Other Clinician: Referring Provider: Jill Alexanders Treating Provider/Extender: Skipper Cliche in Treatment:  4 Active Problems ICD-10 Encounter Code Description Active Date MDM Diagnosis I87.2 Venous insufficiency (chronic) (peripheral) 08/09/2020 No Yes L97.822 Non-pressure chronic ulcer of other part of left lower leg with fat layer 08/09/2020 No Yes exposed [place] (primary) hypertension 08/09/2020 No Yes I25.10 Atherosclerotic heart disease of native coronary artery without angina 08/09/2020  No Yes pectoris R68.0 Hypothermia, not associated with low environmental temperature 08/09/2020 No Yes Inactive Problems Resolved Problems Electronic Signature(s) Signed: 09/09/2020 8:16:15 AM By: Worthy Keeler PA-C Entered By: Worthy Keeler on 09/09/2020 08:16:14 Erler, Corion S. ([id number]) -------------------------------------------------------------------------------- Progress Note Details Patient Name: [name], Brenten S. Date of Service: 09/09/2020 8:00 AM Medical Record Number: [id number] Patient Account Number: [id number] Date of Birth/Sex: 03-30-1961 (60 y.o. M) Treating RN: Carlene Coria Primary Care Provider: Jill Alexanders Other Clinician: Referring Provider: Jill Alexanders Treating Provider/Extender: Skipper Cliche in Treatment: 4 Subjective Chief Complaint Information obtained from Patient Left LE Ulcer History of Present Illness (HPI) 08/09/2020 upon evaluation today patient appears to be doing somewhat poorly in regard to his left lower extremity. Subsequently he on January 14 was seen by the [facility] wound care center. It appears that they actually put him in a 4-layer compression wrap along with [facility] he tells me that felt pretty good but again that is quite a drive for him to get there. He mainly was being seen there is that is where his cancer doctor is. With that being said the patient tells me currently that he is having some discomfort at the site but is not terrible. He has been tolerating the dressing changes without complication which is great news. 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 He does seem to have potentially some issues with regulating his temperature which is consistent with this process. We saw that today as well with his low vital sign temperature reading. The initial injury actually occurred when the patient dropped asphalt which landed on the anterior portion of his leg causing the injury. The good news is this does not appear to be too deep which hopefully should bode well for rapid healing. 08/16/2020 on evaluation today patient appears to be doing well with regard to his wounds currently. In fact the wound appears to be about half the size of what it was last week and overall I am extremely pleased with where things stand. Fortunately there is no signs of active infection at this time. No fevers, chills, nausea, vomiting, or diarrhea. 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 We have been using Hydrofera Blue under 3 layer compression 4/6; continued improvement using Hydrofera Blue under 3 layer compression 09/09/2020 upon evaluation today patient appears to be doing well with regard to his wound. We have been using Hydrofera Blue which has done well but he seems to have may be stalled this a little bit I think this may be getting stuck to the wound bed is the main issue. Fortunately there does not appear to be any signs of active infection which is great news overall very pleased with where things stand today. Objective Constitutional Well-nourished and well-hydrated in no acute distress. Vitals Time Taken: 8:09 AM, Height: 65 in, Weight: 155 lbs, BMI: 25.8, Temperature: 97.9 F, Pulse: 55 bpm, Respiratory Rate: 18 breaths/min, Blood Pressure: 125/69 mmHg. Respiratory normal breathing without difficulty. Psychiatric this patient is able to make decisions and demonstrates good insight into disease  process. Alert and Oriented x 3. pleasant and cooperative. General Notes: Upon inspection patient's wound bed actually showed signs of good granulation epithelization at this point. There does not appear to be any evidence of infection which is great news and overall very pleased with where things stand today. Integumentary (Hair, Skin) Wound #1 status  is Open. Original cause of wound was Trauma. The date acquired was: 03/09/2020. The wound has been in treatment 4 weeks. The wound is located on the Left,Medial Lower Leg. The wound measures 0.4cm length x 0.9cm width x 0.1cm depth; 0.283cm^2 area and 0.028cm^3 volume. There is Fat Layer (Subcutaneous Tissue) exposed. There is no tunneling or undermining noted. There is a small amount of serous drainage noted. The wound margin is flat and intact. There is large (67-100%) red, pink granulation within the wound bed. There is no necrotic tissue within the wound bed. Westenberger, Hosey S. ([id number]) Assessment Active Problems ICD-10 Venous insufficiency (chronic) (peripheral) Non-pressure chronic ulcer of other part of left lower leg with fat layer exposed Essential (primary) hypertension Atherosclerotic heart disease of native coronary artery without angina pectoris Hypothermia, not associated with low environmental temperature Procedures Wound #1 Pre-procedure diagnosis of Wound #1 is a Venous Leg Ulcer located on the Left,Medial Lower Leg . There was a Three Layer Compression Therapy Procedure by Carlene Coria, RN. Post procedure Diagnosis Wound #1: Same as Pre-Procedure Plan Follow-up Appointments: Return Appointment in 1 week. Bathing/ Shower/ Hygiene: May shower with wound dressing protected with water repellent cover or cast protector. No tub bath. Edema Control - Lymphedema / Segmental Compressive Device / Other: Optional: One layer of unna paste to top of compression wrap (to act as an anchor). Elevate, Exercise Daily and Avoid Standing for  Long Periods of Time. Elevate legs to the level of the heart and pump ankles as often as possible Elevate leg(s) parallel to the floor when sitting. WOUND #1: - Lower Leg Wound Laterality: Left, Medial Cleanser: Normal Saline 1 x Per Week/30 Days Discharge Instructions: Wash your hands with soap and water. Remove old dressing, discard into plastic bag and place into trash. Cleanse the wound with Normal Saline prior to applying a clean dressing using gauze sponges, not tissues or cotton balls. Do not scrub or use excessive force. Pat dry using gauze sponges, not tissue or cotton balls. Peri-Wound Care: Desitin Maximum Strength Ointment 4 (oz) (Generic) 1 x Per Week/30 Days Primary Dressing: Silvercel Small 2x2 (in/in) (Generic) 1 x Per Week/30 Days Discharge Instructions: Apply Silvercel Small 2x2 (in/in) as instructed Secondary Dressing: ABD Pad 5x9 (in/in) 1 x Per Week/30 Days Discharge Instructions: Cover with ABD pad Compression Wrap: Profore Lite LF 3 Multilayer Compression Bandaging System 1 x Per Week/30 Days Discharge Instructions: Apply 3 multi-layer wrap as prescribed. 1. Would recommend currently that we going continue with the wound care measures with regard to the compression wrap. We will continue with the 3 layer compression wrap which I think is doing a great job. 2. I am going to switch to a silver alginate dressing I think this may be better than the [facility] Blue as far sticking to the wound and with a little bit of zinc around the edges where the good skin is I think that we will also prevent sticking. 3. I am also can recommend the patient continue to elevate his legs much as possible. We will see patient back for reevaluation in 1 week here in the clinic. If anything worsens or changes patient will contact our office for additional recommendations. Electronic Signature(s) Signed: 09/09/2020 8:27:01 AM By: Worthy Keeler PA-C Entered By: Worthy Keeler on 09/09/2020  08:27:01 Risden, Alieu S. ([id number]) -------------------------------------------------------------------------------- SuperBill Details Patient Name: [name], Rameses S. Date of Service: 09/09/2020 Medical Record Number: [id number] Patient Account Number: [id number] Date of Birth/Sex: [birth year] (60 y.o. M) Treating  RN: Carlene Coria Primary Care Provider: Jill Alexanders Other Clinician: Referring Provider: Jill Alexanders Treating Provider/Extender: Skipper Cliche in Treatment: 4 Diagnosis Coding ICD-10 Codes Code Description I87.2 Venous insufficiency (chronic) (peripheral) L97.822 Non-pressure chronic ulcer of other part of left lower leg with fat layer exposed [place] (primary) hypertension I25.10 Atherosclerotic heart disease of native coronary artery without angina pectoris R68.0 Hypothermia, not associated with low environmental temperature Facility Procedures CPT4 Code: [id number] Description: (Facility Use Only) [id number] - [place] LWR LT LEG Modifier: Quantity: 1 Physician Procedures CPT4 Code: [id number] Description: 85631 - WC PHYS LEVEL 3 - EST PT Modifier: Quantity: 1 CPT4 Code: Description: ICD-10 Diagnosis Description I87.2 Venous insufficiency (chronic) (peripheral) L97.822 Non-pressure chronic ulcer of other part of left lower leg with fat lay I10 Essential (primary) hypertension I25.10 Atherosclerotic heart disease of  native coronary artery without angina Modifier: er exposed pectoris Quantity: Electronic Signature(s) Signed: 09/09/2020 8:33:21 AM By: Worthy Keeler PA-C Entered By: Worthy Keeler on 09/09/2020 08:33:21

## 2020-09-12 ENCOUNTER — Ambulatory Visit: Payer: BC Managed Care – PPO | Admitting: Physician Assistant

## 2020-09-12 NOTE — Progress Notes (Signed)
BLAISE, GRIESHABER ([id number]) Visit Report for 09/09/2020 Arrival Information Details Patient Name: [name], [name]. Date of Service: 09/09/2020 8:00 AM Medical Record Number: [id number] Patient Account Number: [id number] Date of Birth/Sex: Mar 29, 1961 (60 y.o. M) Treating RN: [name] Primary Care [name]: [name] Other Clinician: Referring [name]: [name] Treating [name]/Extender: [name] in Treatment: 4 Visit Information History Since Last Visit Added or deleted any medications: No Patient Arrived: Ambulatory Had a fall or experienced change in No Arrival Time: 08:08 activities of daily living that may affect Accompanied By: self risk of falls: Transfer Assistance: None Hospitalized since last visit: No Patient Identification Verified: Yes Pain Present Now: Yes Secondary Verification Process Completed: Yes Patient Requires Transmission-Based No Precautions: Patient Has Alerts: Yes Patient Alerts: Patient on Blood Thinner NOT diabetic Electronic Signature(s) Signed: 09/09/2020 1:17:09 PM By: [name] Entered By: [name] on 09/09/2020 08:09:25 [name], [name] ([id number]) -------------------------------------------------------------------------------- Clinic Level of Care Assessment Details Patient Name: [name], [name] Date of Service: 09/09/2020 8:00 AM Medical Record Number: [id number] Patient Account Number: [id number] Date of Birth/Sex: [birth year] (60 y.o. M) Treating RN: [name] Primary Care Archit Leger: [name] Other Clinician: Referring [name]: [name] Treating [name]/Extender: [name] in Treatment: 4 Clinic Level of Care Assessment Items TOOL 1 Quantity Score []  - Use when EandM and Procedure is performed on INITIAL visit 0 ASSESSMENTS - Nursing Assessment / Reassessment []  - General Physical Exam (combine w/ comprehensive assessment (listed just below) when performed on new 0 pt. evals) []  - 0 Comprehensive  Assessment (HX, ROS, Risk Assessments, Wounds Hx, etc.) ASSESSMENTS - Wound and Skin Assessment / Reassessment []  - Dermatologic / Skin Assessment (not related to wound area) 0 ASSESSMENTS - Ostomy and/or Continence Assessment and Care []  - Incontinence Assessment and Management 0 []  - 0 Ostomy Care Assessment and Management (repouching, etc.) PROCESS - Coordination of Care []  - Simple Patient / Family Education for ongoing care 0 []  - 0 Complex (extensive) Patient / Family Education for ongoing care []  - 0 Staff obtains Programmer, systems, Records, Test Results / Process Orders []  - 0 Staff telephones HHA, Nursing Homes / Clarify orders / etc []  - 0 Routine Transfer to another Facility (non-emergent condition) []  - 0 Routine Hospital Admission (non-emergent condition) []  - 0 New Admissions / Biomedical engineer / Ordering NPWT, Apligraf, etc. []  - 0 Emergency Hospital Admission (emergent condition) PROCESS - Special Needs []  - Pediatric / Minor Patient Management 0 []  - 0 Isolation Patient Management []  - 0 Hearing / Language / Visual special needs []  - 0 Assessment of Community assistance (transportation, D/C planning, etc.) []  - 0 Additional assistance / Altered mentation []  - 0 Support Surface(s) Assessment (bed, cushion, seat, etc.) INTERVENTIONS - Miscellaneous []  - External ear exam 0 []  - 0 Patient Transfer (multiple staff / Civil Service fast streamer / Similar devices) []  - 0 Simple Staple / Suture removal (25 or less) []  - 0 Complex Staple / Suture removal (26 or more) []  - 0 Hypo/Hyperglycemic Management (do not check if billed separately) []  - 0 Ankle / Brachial Index (ABI) - do not check if billed separately Has the patient been seen at the hospital within the last three years: Yes Total Score: 0 Level Of Care: ____ [name] ([id number]) Electronic Signature(s) Signed: 09/12/2020 7:55:11 AM By: Tyler Coria RN Entered By: [name] on 09/09/2020 08:27:26 [name], Tyler  S. ([id number]) -------------------------------------------------------------------------------- Compression Therapy Details Patient Name: [name], [name] Date of Service: 09/09/2020 8:00 AM  Medical Record Number: [id number] Patient Account Number: [id number] Date of Birth/Sex: 06/01/1960 (60 y.o. M) Treating RN: [name] Primary Care Santana Gosdin: [name] Other Clinician: Referring [name]: [name] Treating [name]/Extender: [name] in Treatment: 4 Compression Therapy Performed for Wound Assessment: Wound #1 Left,Medial Lower Leg Performed By: Clinician Tyler Coria, RN Compression Type: Three Layer Post Procedure Diagnosis Same as Pre-procedure Electronic Signature(s) Signed: 09/12/2020 7:55:11 AM By: Tyler Coria RN Entered By: [name] on 09/09/2020 08:24:30 [name], [name] ([id number]) -------------------------------------------------------------------------------- Encounter Discharge Information Details Patient Name: [name], [name] Date of Service: 09/09/2020 8:00 AM Medical Record Number: [id number] Patient Account Number: [id number] Date of Birth/Sex: February 28, 1961 (60 y.o. M) Treating RN: [name] Primary Care Janki Dike: [name] Other Clinician: Referring Kenzlee Fishburn: [name] Treating Delon Revelo/Extender: [name] in Treatment: 4 Encounter Discharge Information Items Discharge Condition: Stable Ambulatory Status: Ambulatory Discharge Destination: Home Transportation: Private Auto Accompanied By: self Schedule Follow-up Appointment: Yes Clinical Summary of Care: Electronic Signature(s) Signed: 09/09/2020 1:17:09 PM By: [name] Entered By: [name] on 09/09/2020 08:36:03 [name], [name] ([id number]) -------------------------------------------------------------------------------- Lower Extremity Assessment Details Patient Name: [name], [name] Date of Service: 09/09/2020 8:00 AM Medical Record Number: [id number] Patient Account  Number: [id number] Date of Birth/Sex: 1961-04-28 (60 y.o. M) Treating RN: [name] Primary Care Siniyah Evangelist: [name] Other Clinician: Referring Sariya Trickey: [name] Treating Rawan Riendeau/Extender: Jeri Cos Weeks in Treatment: 4 Edema Assessment Assessed: [Left: Yes] [Right: No] Edema: [Left: N] [Right: o] Calf Left: Right: Point of Measurement: 33 cm From Medial Instep 31 cm Ankle Left: Right: Point of Measurement: 12 cm From Medial Instep 19.5 cm Electronic Signature(s) Signed: 09/09/2020 1:17:09 PM By: [name] Entered By: [name] on 09/09/2020 08:17:47 [name], [name] ([id number]) -------------------------------------------------------------------------------- Multi Wound Chart Details Patient Name: [name], [name] Date of Service: 09/09/2020 8:00 AM Medical Record Number: [id number] Patient Account Number: [id number] Date of Birth/Sex: 07/21/1960 (60 y.o. M) Treating RN: [name] Primary Care Antonieta Slaven: [name] Other Clinician: Referring Brogan Martis: [name] Treating Kerrianne Jeng/Extender: [name] in Treatment: 4 Vital Signs Height(in): 65 Pulse(bpm): 55 Weight(lbs): 155 Blood Pressure(mmHg): 125/69 Body Mass Index(BMI): 26 Temperature(F): 97.9 Respiratory Rate(breaths/min): 18 Photos: [N/A:N/A] Wound Location: Left, Medial Lower Leg N/A N/A Wounding Event: Trauma N/A N/A Primary Etiology: Venous Leg Ulcer N/A N/A Comorbid History: Glaucoma, Coronary Artery N/A N/A Disease, Hypertension, Neuropathy, Received Chemotherapy Date Acquired: 03/09/2020 N/A N/A Weeks of Treatment: 4 N/A N/A Wound Status: Open N/A N/A Clustered Wound: Yes N/A N/A Measurements L x W x D (cm) 0.4x0.9x0.1 N/A N/A Area (cm) : 0.283 N/A N/A Volume (cm) : 0.028 N/A N/A % Reduction in Area: 95.40% N/A N/A % Reduction in Volume: 95.40% N/A N/A Classification: Full Thickness Without Exposed N/A N/A Support Structures Exudate Amount: Small N/A N/A Exudate Type:  Serous N/A N/A Exudate Color: amber N/A N/A Wound Margin: Flat and Intact N/A N/A Granulation Amount: Large (67-100%) N/A N/A Granulation Quality: Red, Pink N/A N/A Necrotic Amount: None Present (0%) N/A N/A Exposed Structures: Fat Layer (Subcutaneous Tissue): N/A N/A Yes Fascia: No Tendon: No Muscle: No Joint: No Bone: No Epithelialization: None N/A N/A Treatment Notes Electronic Signature(s) Signed: 09/12/2020 7:55:11 AM By: Tyler Coria RN Entered By: [name] on 09/09/2020 08:24:17 Donlon, Sergei S. ([id number]) [place], Tyler Hampton. ([id number]) -------------------------------------------------------------------------------- Multi-Disciplinary Care Plan Details Patient Name: [name], Quasim S. Date of Service: 09/09/2020 8:00 AM Medical Record Number: [id number] Patient Account Number: [id number] Date of Birth/Sex: [birth year] (60 y.o. M) Treating RN: Epps,  Morey Hummingbird Primary Care Daren Doswell: [name] Other Clinician: Referring Corran Lalone: [name] Treating Demitrious Mccannon/Extender: [name] in Treatment: 4 Active Inactive Wound/Skin Impairment Nursing Diagnoses: Impaired tissue integrity Goals: Patient/caregiver will verbalize understanding of skin care regimen Date Initiated: 08/09/2020 Target Resolution Date: 09/09/2020 Goal Status: Active Ulcer/skin breakdown will have a volume reduction of 30% by week 4 Date Initiated: 08/09/2020 Target Resolution Date: 09/09/2020 Goal Status: Active Ulcer/skin breakdown will have a volume reduction of 50% by week 8 Date Initiated: 08/09/2020 Target Resolution Date: 10/09/2020 Goal Status: Active Ulcer/skin breakdown will have a volume reduction of 80% by week 12 Date Initiated: 08/09/2020 Target Resolution Date: 11/09/2020 Goal Status: Active Ulcer/skin breakdown will heal within 14 weeks Date Initiated: 08/09/2020 Target Resolution Date: 12/09/2020 Goal Status: Active Interventions: Assess patient/caregiver ability to obtain necessary  supplies Assess patient/caregiver ability to perform ulcer/skin care regimen upon admission and as needed Assess ulceration(s) every visit Provide education on ulcer and skin care Treatment Activities: Skin care regimen initiated : 08/09/2020 Notes: Electronic Signature(s) Signed: 09/12/2020 7:55:11 AM By: Tyler Coria RN Entered By: [name] on 09/09/2020 08:24:07 Penado, Ashawn S. ([id number]) -------------------------------------------------------------------------------- Pain Assessment Details Patient Name: [name], Autry S. Date of Service: 09/09/2020 8:00 AM Medical Record Number: [id number] Patient Account Number: [id number] Date of Birth/Sex: 25-May-1961 (60 y.o. M) Treating RN: [name] Primary Care Eldana Isip: [name] Other Clinician: Referring Lynzy Rawles: [name] Treating Myda Detwiler/Extender: [name] in Treatment: 4 Active Problems Location of Pain Severity and Description of Pain Patient Has Paino Yes Site Locations Rate the pain. Current Pain Level: 4 Pain Management and Medication Current Pain Management: Electronic Signature(s) Signed: 09/09/2020 1:17:09 PM By: [name] Entered By: [name] on 09/09/2020 08:09:51 Nordell, Saintclair S. ([id number]) -------------------------------------------------------------------------------- Patient/Caregiver Education Details Patient Name: [name], Gohan S. Date of Service: 09/09/2020 8:00 AM Medical Record Number: [id number] Patient Account Number: [id number] Date of Birth/Gender: 1960-10-14 (61 y.o. M) Treating RN: [name] Primary Care Physician: [name] Other Clinician: Referring Physician: Jill [name] Treating Physician/Extender: [name] in Treatment: 4 Education Assessment Education Provided To: Patient Education Topics Provided Wound/Skin Impairment: Methods: Explain/Verbal Responses: State content correctly Electronic Signature(s) Signed: 09/12/2020 7:55:11 AM By: Tyler Coria  RN Entered By: [name] on 09/09/2020 08:27:57 Gentz, Trace S. ([id number]) -------------------------------------------------------------------------------- Wound Assessment Details Patient Name: [name], Fatih S. Date of Service: 09/09/2020 8:00 AM Medical Record Number: [id number] Patient Account Number: [id number] Date of Birth/Sex: Feb 21, 1961 (60 y.o. M) Treating RN: [name] Primary Care Juletta Berhe: [name] Other Clinician: Referring Kelis Plasse: [name] Treating Othell Jaime/Extender: [name] in Treatment: 4 Wound Status Wound Number: 1 Primary Venous Leg Ulcer Etiology: Wound Location: Left, Medial Lower Leg Wound Open Wounding Event: Trauma Status: Date Acquired: 03/09/2020 Comorbid Glaucoma, Coronary Artery Disease, Hypertension, Weeks Of Treatment: 4 History: Neuropathy, Received Chemotherapy Clustered Wound: Yes Photos Wound Measurements Length: (cm) 0.4 Width: (cm) 0.9 Depth: (cm) 0.1 Area: (cm) 0.283 Volume: (cm) 0.028 % Reduction in Area: 95.4% % Reduction in Volume: 95.4% Epithelialization: None Tunneling: No Undermining: No Wound Description Classification: Full Thickness Without Exposed Support Structures Wound Margin: Flat and Intact Exudate Amount: Small Exudate Type: Serous Exudate Color: amber Foul Odor After Cleansing: No Slough/Fibrino No Wound Bed Granulation Amount: Large (67-100%) Exposed Structure Granulation Quality: Red, Pink Fascia Exposed: No Necrotic Amount: None Present (0%) Fat Layer (Subcutaneous Tissue) Exposed: Yes Tendon Exposed: No Muscle Exposed: No Joint Exposed: No Bone Exposed: No Treatment Notes Wound #1 (Lower Leg) Wound Laterality: Left, Medial Cleanser Normal Saline Discharge  Instruction: Wash your hands with soap and water. Remove old dressing, discard into plastic bag and place into trash. Cleanse the wound with Normal Saline prior to applying a clean dressing using gauze sponges, not tissues or  cotton balls. Do not scrub or use excessive force. Pat dry using gauze sponges, not tissue or cotton balls. Lucarelli, Filiberto S. ([id number]) Peri-Wound Care Desitin Maximum Strength Ointment 4 (oz) Topical Primary Dressing Silvercel Small 2x2 (in/in) Discharge Instruction: Apply Silvercel Small 2x2 (in/in) as instructed Secondary Dressing ABD Pad 5x9 (in/in) Discharge Instruction: Cover with ABD pad Secured With Compression Wrap Profore Lite LF 3 Multilayer Compression Bandaging System Discharge Instruction: Apply 3 multi-layer wrap as prescribed. Compression Stockings Add-Ons Electronic Signature(s) Signed: 09/09/2020 1:17:09 PM By: [name] Entered By: [name] on 09/09/2020 08:17:01 Seyfried, Aurther S. ([id number]) -------------------------------------------------------------------------------- Vitals Details Patient Name: [name], Jasiel S. Date of Service: 09/09/2020 8:00 AM Medical Record Number: [id number] Patient Account Number: [id number] Date of Birth/Sex: [birth year] (60 y.o. M) Treating RN: [name] Primary Care Deziray Nabi: [name] Other Clinician: Referring Anniemae Haberkorn: [name] Treating Latrena Benegas/Extender: [name] in Treatment: 4 Vital Signs Time Taken: 08:09 Temperature (F): 97.9 Height (in): 65 Pulse (bpm): 55 Weight (lbs): 155 Respiratory Rate (breaths/min): 18 Body Mass Index (BMI): 25.8 Blood Pressure (mmHg): 125/69 Reference Range: 80 - 120 mg / dl Electronic Signature(s) Signed: 09/09/2020 1:17:09 PM By: [name] Entered ByDonnamarie [name] on 09/09/2020 93:81:01

## 2020-09-16 ENCOUNTER — Encounter: Payer: BC Managed Care – PPO | Admitting: Physician Assistant

## 2020-09-16 ENCOUNTER — Other Ambulatory Visit: Payer: Self-pay

## 2020-09-16 DIAGNOSIS — L97822 Non-pressure chronic ulcer of other part of left lower leg with fat layer exposed: Secondary | ICD-10-CM | POA: Diagnosis not present

## 2020-09-16 DIAGNOSIS — D89811 Chronic graft-versus-host disease: Secondary | ICD-10-CM | POA: Diagnosis not present

## 2020-09-16 DIAGNOSIS — I890 Lymphedema, not elsewhere classified: Secondary | ICD-10-CM | POA: Diagnosis not present

## 2020-09-16 DIAGNOSIS — I10 Essential (primary) hypertension: Secondary | ICD-10-CM | POA: Diagnosis not present

## 2020-09-16 DIAGNOSIS — I872 Venous insufficiency (chronic) (peripheral): Secondary | ICD-10-CM | POA: Diagnosis not present

## 2020-09-16 NOTE — Progress Notes (Addendum)
Tyler, [name] ([id number]) Visit Report for 09/16/2020 Chief Complaint Document Details Patient Name: Tyler, [name]. Date of Service: 09/16/2020 8:30 AM Medical Record Number: [id number] Patient Account Number: [id number] Date of Birth/Sex: 1960/06/28 (60 y.o. M) Treating RN: [name] Primary Care Provider: Jill [name] Other Clinician: Referring Provider: Jill [name] Treating Provider/Extender: [name] in Treatment: 5 Information Obtained from: Patient Chief Complaint Left LE Ulcer Electronic Signature(s) Signed: 09/16/2020 8:39:21 AM By: Tyler Keeler PA-C Entered By: [name] on 09/16/2020 08:39:21 [name], [name] ([id number]) -------------------------------------------------------------------------------- HPI Details Patient Name: [name], [name] Date of Service: 09/16/2020 8:30 AM Medical Record Number: [id number] Patient Account Number: [id number] Date of Birth/Sex: 01/22/1961 (60 y.o. M) Treating RN: [name] Primary Care Provider: Jill [name] Other Clinician: Referring Provider: Jill [name] Treating Provider/Extender: [name] in Treatment: 5 History of Present Illness HPI Description: 08/09/2020 upon evaluation today patient appears to be doing somewhat poorly in regard to his left lower extremity. Subsequently he on January 14 was seen by the [facility] wound care center. It appears that they actually put him in a 4-layer compression wrap along with [facility] he tells me that felt pretty good but again that is quite a drive for him to get there. He mainly was being seen there is that is where his cancer doctor is. With that being said the patient tells me currently that he is having some discomfort at the site but is not terrible. He has been tolerating the dressing changes without complication which is great news. 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 He does  seem to have potentially some issues with regulating his temperature which is consistent with this process. We saw that today as well with his low vital sign temperature reading. The initial injury actually occurred when the patient dropped asphalt which landed on the anterior portion of his leg causing the injury. The good news is this does not appear to be too deep which hopefully should bode well for rapid healing. 08/16/2020 on evaluation today patient appears to be doing well with regard to his wounds currently. In fact the wound appears to be about half the size of what it was last week and overall I am extremely pleased with where things stand. Fortunately there is no signs of active infection at this time. No fevers, chills, nausea, vomiting, or diarrhea. 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 We have been using Hydrofera Blue under 3 layer compression 4/6; continued improvement using Hydrofera Blue under 3 layer compression 09/09/2020 upon evaluation today patient appears to be doing well with regard to his wound. We have been using Hydrofera Blue which has done well but he seems to have may be stalled this a little bit I think this may be getting stuck to the wound bed is the main issue. Fortunately there does not appear to be any signs of active infection which is great news overall very pleased with where things stand today. 09/16/2020 upon evaluation today patient appears to be doing well with regard to his leg ulcers. In fact there is very little open although he still has a little bit of weeping that I see. There was not a tremendous amount of drainage over the past week. Fortunately I think he is doing quite well. Electronic Signature(s) Signed: 09/16/2020 9:06:47 AM By: Tyler Keeler PA-C Entered By:  [name] on 09/16/2020 09:06:47 [name], [name]  ([id number]) -------------------------------------------------------------------------------- Physical Exam Details Patient Name: [name], [name] Date of Service: 09/16/2020 8:30 AM Medical Record Number: [id number] Patient Account Number: [id number] Date of Birth/Sex: 01/06/1961 (60 y.o. M) Treating RN: [name] Primary Care Provider: Jill [name] Other Clinician: Referring Provider: Jill [name] Treating Provider/Extender: [name] in Treatment: 5 Constitutional Well-nourished and well-hydrated in no acute distress. Respiratory normal breathing without difficulty. Psychiatric this patient is able to make decisions and demonstrates good insight into disease process. Alert and Oriented x 3. pleasant and cooperative. Notes Upon inspection patient's wound bed actually showed signs of good granulation epithelization at this point. There does not appear to be any evidence of infection and in fact he appears to be very close to complete closure. In general I think this is doing very well. Electronic Signature(s) Signed: 09/16/2020 9:07:02 AM By: Tyler Keeler PA-C Entered By: [name] on 09/16/2020 09:07:02 [name], [name] ([id number]) -------------------------------------------------------------------------------- Physician Orders Details Patient Name: [name], [name] Date of Service: 09/16/2020 8:30 AM Medical Record Number: [id number] Patient Account Number: [id number] Date of Birth/Sex: 1961-01-14 (60 y.o. M) Treating RN: [name] Primary Care Provider: Jill [name] Other Clinician: Referring Provider: Jill [name] Treating Provider/Extender: [name] in Treatment: 5 Verbal / Phone Orders: No Diagnosis Coding ICD-10 Coding Code Description I87.2 Venous insufficiency (chronic) (peripheral) L97.822 Non-pressure chronic ulcer of other part of left lower leg with fat layer exposed I10 Essential (primary) hypertension I25.10 Atherosclerotic heart disease  of native coronary artery without angina pectoris R68.0 Hypothermia, not associated with low environmental temperature Follow-up Appointments o Return Appointment in 1 week. Bathing/ Shower/ Hygiene o May shower with wound dressing protected with water repellent cover or cast protector. o No tub bath. Edema Control - Lymphedema / Segmental Compressive Device / Other Left Lower Extremity o Optional: One layer of unna paste to top of compression wrap (to act as an anchor). o Elevate, Exercise Daily and Avoid Standing for Long Periods of Time. o Elevate legs to the level of the heart and pump ankles as often as possible o Elevate leg(s) parallel to the floor when sitting. Wound Treatment Wound #1 - Lower Leg Wound Laterality: Left, Medial Cleanser: Normal Saline 1 x Per Week/30 Days Discharge Instructions: Wash your hands with soap and water. Remove old dressing, discard into plastic bag and place into trash. Cleanse the wound with Normal Saline prior to applying a clean dressing using gauze sponges, not tissues or cotton balls. Do not scrub or use excessive force. Pat dry using gauze sponges, not tissue or cotton balls. Peri-Wound Care: Desitin Maximum Strength Ointment 4 (oz) (Generic) 1 x Per Week/30 Days Primary Dressing: Curad Oil Emulsion Dressing 3x3 (in/in) 1 x Per Week/30 Days Discharge Instructions: apply to wound bed Secondary Dressing: ABD Pad 5x9 (in/in) 1 x Per Week/30 Days Discharge Instructions: Cover with ABD pad Compression Wrap: Profore Lite LF 3 Multilayer Compression Bandaging System 1 x Per Week/30 Days Discharge Instructions: Apply 3 multi-layer wrap as prescribed. Electronic Signature(s) Signed: 09/16/2020 1:09:49 PM By: Tyler Keeler PA-C Signed: 09/21/2020 3:51:10 PM By: Tyler Coria RN Entered By: [name] on 09/16/2020 09:01:56 [place], [place]. ([id number]) -------------------------------------------------------------------------------- Problem  List Details Patient Name: [name], Mathayus S. Date of Service: 09/16/2020 8:30 AM Medical Record Number: [id number] Patient Account Number: [id number] Date of Birth/Sex: 1960-11-18 (60 y.o. M) Treating RN: [name] Primary Care Provider: Jill [name] Other Clinician: Referring Provider: Jill [name] Treating  Provider/Extender: Jeri Cos Weeks in Treatment: 5 Active Problems ICD-10 Encounter Code Description Active Date MDM Diagnosis I87.2 Venous insufficiency (chronic) (peripheral) 08/09/2020 No Yes L97.822 Non-pressure chronic ulcer of other part of left lower leg with fat layer 08/09/2020 No Yes exposed [place] (primary) hypertension 08/09/2020 No Yes I25.10 Atherosclerotic heart disease of native coronary artery without angina 08/09/2020 No Yes pectoris R68.0 Hypothermia, not associated with low environmental temperature 08/09/2020 No Yes Inactive Problems Resolved Problems Electronic Signature(s) Signed: 09/16/2020 8:39:14 AM By: Tyler Keeler PA-C Entered By: [name] on 09/16/2020 08:39:14 [name], [name] ([id number]) -------------------------------------------------------------------------------- Progress Note Details Patient Name: [name], [name] Date of Service: 09/16/2020 8:30 AM Medical Record Number: [id number] Patient Account Number: [id number] Date of Birth/Sex: [birth year] (60 y.o. M) Treating RN: [name] Primary Care Provider: Jill [name] Other Clinician: Referring Provider: Jill [name] Treating Provider/Extender: [name] in Treatment: 5 Subjective Chief Complaint Information obtained from Patient Left LE Ulcer History of Present Illness (HPI) 08/09/2020 upon evaluation today patient appears to be doing somewhat poorly in regard to his left lower extremity. Subsequently he on January 14 was seen by the [facility] wound care center. It appears that they actually put him in a 4-layer compression wrap along with [facility] he tells me that  felt pretty good but again that is quite a drive for him to get there. He mainly was being seen there is that is where his cancer doctor is. With that being said the patient tells me currently that he is having some discomfort at the site but is not terrible. He has been tolerating the dressing changes without complication which is great news. 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 He does seem to have potentially some issues with regulating his temperature which is consistent with this process. We saw that today as well with his low vital sign temperature reading. The initial injury actually occurred when the patient dropped asphalt which landed on the anterior portion of his leg causing the injury. The good news is this does not appear to be too deep which hopefully should bode well for rapid healing. 08/16/2020 on evaluation today patient appears to be doing well with regard to his wounds currently. In fact the wound appears to be about half the size of what it was last week and overall I am extremely pleased with where things stand. Fortunately there is no signs of active infection at this time. No fevers, chills, nausea, vomiting, or diarrhea. 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 We have been using Hydrofera Blue under 3 layer compression 4/6; continued improvement using Hydrofera Blue under 3 layer compression 09/09/2020 upon evaluation today patient appears to be doing well with regard to his wound. We have been using Hydrofera Blue which has done well but he seems to have may be stalled this a little bit I think this may be getting stuck to the wound bed is the main issue. Fortunately there does not appear to be any signs of active infection which is great news overall very pleased with where things stand today. 09/16/2020 upon evaluation today patient  appears to be doing well with regard to his leg ulcers. In fact there is very little open although he still has a little bit of weeping that I see. There was not a tremendous amount of drainage over the past week. Fortunately I  think he is doing quite well. Objective Constitutional Well-nourished and well-hydrated in no acute distress. Vitals Time Taken: 8:41 AM, Height: 65 in, Weight: 155 lbs, BMI: 25.8, Temperature: 98.2 F, Pulse: 51 bpm, Respiratory Rate: 18 breaths/min, Blood Pressure: 122/71 mmHg. Respiratory normal breathing without difficulty. Psychiatric this patient is able to make decisions and demonstrates good insight into disease process. Alert and Oriented x 3. pleasant and cooperative. General Notes: Upon inspection patient's wound bed actually showed signs of good granulation epithelization at this point. There does not appear to be any evidence of infection and in fact he appears to be very close to complete closure. In general I think this is doing very well. Integumentary (Hair, Skin) Wound #1 status is Open. Original cause of wound was Trauma. The date acquired was: 03/09/2020. The wound has been in treatment 5 weeks. The wound is located on the Left,Medial Lower Leg. The wound measures 0.3cm length x 0.5cm width x 0.1cm depth; 0.118cm^2 area and 0.012cm^3 volume. There is Fat Layer (Subcutaneous Tissue) exposed. There is no tunneling or undermining noted. There is a small amount of serous drainage noted. The wound margin is flat and intact. There is large (67-100%) red, pink granulation within the wound bed. There is no [name], [name] ([id number]) necrotic tissue within the wound bed. Assessment Active Problems ICD-10 Venous insufficiency (chronic) (peripheral) Non-pressure chronic ulcer of other part of left lower leg with fat layer exposed Essential (primary) hypertension Atherosclerotic heart disease of native coronary artery without angina pectoris Hypothermia,  not associated with low environmental temperature Procedures Wound #1 Pre-procedure diagnosis of Wound #1 is a Venous Leg Ulcer located on the Left,Medial Lower Leg . There was a Three Layer Compression Therapy Procedure by Tyler Coria, RN. Post procedure Diagnosis Wound #1: Same as Pre-Procedure Plan Follow-up Appointments: Return Appointment in 1 week. Bathing/ Shower/ Hygiene: May shower with wound dressing protected with water repellent cover or cast protector. No tub bath. Edema Control - Lymphedema / Segmental Compressive Device / Other: Optional: One layer of unna paste to top of compression wrap (to act as an anchor). Elevate, Exercise Daily and Avoid Standing for Long Periods of Time. Elevate legs to the level of the heart and pump ankles as often as possible Elevate leg(s) parallel to the floor when sitting. WOUND #1: - Lower Leg Wound Laterality: Left, Medial Cleanser: Normal Saline 1 x Per Week/30 Days Discharge Instructions: Wash your hands with soap and water. Remove old dressing, discard into plastic bag and place into trash. Cleanse the wound with Normal Saline prior to applying a clean dressing using gauze sponges, not tissues or cotton balls. Do not scrub or use excessive force. Pat dry using gauze sponges, not tissue or cotton balls. Peri-Wound Care: Desitin Maximum Strength Ointment 4 (oz) (Generic) 1 x Per Week/30 Days Primary Dressing: Curad Oil Emulsion Dressing 3x3 (in/in) 1 x Per Week/30 Days Discharge Instructions: apply to wound bed Secondary Dressing: ABD Pad 5x9 (in/in) 1 x Per Week/30 Days Discharge Instructions: Cover with ABD pad Compression Wrap: Profore Lite LF 3 Multilayer Compression Bandaging System 1 x Per Week/30 Days Discharge Instructions: Apply 3 multi-layer wrap as prescribed. 1. Would recommend since he is so close to closure and the main issue is things sticking to the wounds at this point I would recommend that we go ahead and discontinue  the silver alginate dressing which was pretty stuck today as well. We will get initiate treatment with an oil emulsion dressing followed by ABD pad to  cover. 2. I am also can recommend we continue with 3 layer compression wrap. 3. I am also can recommend the patient continue to elevate his legs much as possible. He also is going to see about getting updated compression socks in order to have those so that he will be able to ensure he has good compression once he heals and transitions into that. We will see patient back for reevaluation in 1 week here in the clinic. If anything worsens or changes patient will contact our office for additional recommendations. Electronic Signature(s) Signed: 09/16/2020 9:07:52 AM By: Dietrich [name], [name] ([id number]) Entered By: [name] on 09/16/2020 09:07:52 [name], [name] ([id number]) -------------------------------------------------------------------------------- SuperBill Details Patient Name: [name], [name] Date of Service: 09/16/2020 Medical Record Number: [id number] Patient Account Number: [id number] Date of Birth/Sex: 1961/04/09 (60 y.o. M) Treating RN: [name] Primary Care Provider: Jill [name] Other Clinician: Referring Provider: Jill [name] Treating Provider/Extender: [name] in Treatment: 5 Diagnosis Coding ICD-10 Codes Code Description I87.2 Venous insufficiency (chronic) (peripheral) L97.822 Non-pressure chronic ulcer of other part of left lower leg with fat layer exposed [place] (primary) hypertension I25.10 Atherosclerotic heart disease of native coronary artery without angina pectoris R68.0 Hypothermia, not associated with low environmental temperature Facility Procedures CPT4 Code: [id number] Description: (Facility Use Only) [phone] - [place] LWR LT LEG Modifier: Quantity: 1 Physician Procedures CPT4 [id number] Description: 99213 - WC PHYS LEVEL 3 - EST PT Modifier: Quantity:  1 CPT4 Code: Description: ICD-10 Diagnosis Description I87.2 Venous insufficiency (chronic) (peripheral) L97.822 Non-pressure chronic ulcer of other part of left lower leg with fat lay I10 Essential (primary) hypertension I25.10 Atherosclerotic heart disease of  native coronary artery without angina Modifier: er exposed pectoris Quantity: Electronic Signature(s) Signed: 09/16/2020 9:08:08 AM By: Tyler Keeler PA-C Entered By: [name] on 09/16/2020 [place]

## 2020-09-16 NOTE — Progress Notes (Addendum)
Tyler, [name] ([id number]) Visit Report for 09/16/2020 Arrival Information Details Patient Name: [name], [name]. Date of Service: 09/16/2020 8:30 AM Medical Record Number: [id number] Patient Account Number: [id number] Date of Birth/Sex: [birth year] (60 y.o. M) Treating RN: Donnamarie Poag Primary Care Tarsha Blando: Jill Alexanders Other Clinician: Referring Hawken Bielby: Jill Alexanders Treating Pansey Pinheiro/Extender: Skipper Cliche in Treatment: 5 Visit Information History Since Last Visit Added or deleted any medications: No Patient Arrived: Ambulatory Had a fall or experienced change in No Arrival Time: 08:39 activities of daily living that may affect Accompanied By: self risk of falls: Transfer Assistance: None Hospitalized since last visit: No Patient Identification Verified: Yes Has Dressing in Place as Prescribed: Yes Secondary Verification Process Completed: Yes Has Compression in Place as Prescribed: Yes Patient Requires Transmission-Based No Pain Present Now: Yes Precautions: Patient Has Alerts: Yes Patient Alerts: Patient on Blood Thinner NOT diabetic Electronic Signature(s) Signed: 09/16/2020 4:01:56 PM By: Donnamarie Poag Entered By: Donnamarie Poag on 09/16/2020 08:39:52 Molzahn, Wallace S. ([id number]) -------------------------------------------------------------------------------- Clinic Level of Care Assessment Details Patient Name: Tyler, Eliaz S. Date of Service: 09/16/2020 8:30 AM Medical Record Number: [id number] Patient Account Number: [id number] Date of Birth/Sex: [birth year] (60 y.o. M) Treating RN: Carlene Coria Primary Care Becket Wecker: Jill Alexanders Other Clinician: Referring Persephanie Laatsch: Jill Alexanders Treating Finnean Cerami/Extender: Skipper Cliche in Treatment: 5 Clinic Level of Care Assessment Items TOOL 1 Quantity Score [] - Use when EandM and Procedure is performed on INITIAL visit 0 ASSESSMENTS - Nursing Assessment / Reassessment [] - General Physical Exam (combine w/ comprehensive  assessment (listed just below) when performed on new 0 pt. evals) [] - 0 Comprehensive Assessment (HX, ROS, Risk Assessments, Wounds Hx, etc.) ASSESSMENTS - Wound and Skin Assessment / Reassessment [] - Dermatologic / Skin Assessment (not related to wound area) 0 ASSESSMENTS - Ostomy and/or Continence Assessment and Care [] - Incontinence Assessment and Management 0 [] - 0 Ostomy Care Assessment and Management (repouching, etc.) PROCESS - Coordination of Care [] - Simple Patient / Family Education for ongoing care 0 [] - 0 Complex (extensive) Patient / Family Education for ongoing care [] - 0 Staff obtains Consents, Records, Test Results / Process Orders [] - 0 Staff telephones HHA, Nursing Homes / Clarify orders / etc [] - 0 Routine Transfer to another Facility (non-emergent condition) [] - 0 Routine Hospital Admission (non-emergent condition) [] - 0 New Admissions / Biomedical engineer / Ordering NPWT, Apligraf, etc. [] - 0 Emergency Hospital Admission (emergent condition) PROCESS - Special Needs [] - Pediatric / Minor Patient Management 0 [] - 0 Isolation Patient Management [] - 0 Hearing / Language / Visual special needs [] - 0 Assessment of Community assistance (transportation, D/C planning, etc.) [] - 0 Additional assistance / Altered mentation [] - 0 Support Surface(s) Assessment (bed, cushion, seat, etc.) INTERVENTIONS - Miscellaneous [] - External ear exam 0 [] - 0 Patient Transfer (multiple staff / Civil Service fast streamer / Similar devices) [] - 0 Simple Staple / Suture removal (25 or less) [] - 0 Complex Staple / Suture removal (26 or more) [] - 0 Hypo/Hyperglycemic Management (do not check if billed separately) [] - 0 Ankle / Brachial Index (ABI) - do not check if billed separately Has the patient been seen at the hospital within the last three years: Yes Total Score: 0 Level Of Care: ____ [name] ([id number]) Electronic Signature(s) Signed: 09/21/2020  3:51:10 PM By: Carlene Coria RN Entered By: Carlene Coria on 09/16/2020 09:03:31 Novick, Ashutosh S. ([id number]) --------------------------------------------------------------------------------  Compression Therapy Details Patient Name: Tyler, Jadarian S. Date of Service: 09/16/2020 8:30 AM Medical Record Number: [id number] Patient Account Number: [id number] Date of Birth/Sex: 05/03/1961 (60 y.o. M) Treating RN: Epps, Carrie Primary Care Provider: Lalonde, John Other Clinician: Referring Provider: Lalonde, John Treating Provider/Extender: Stone, Hoyt Weeks in Treatment: 5 Compression Therapy Performed for Wound Assessment: Wound #1 Left,Medial Lower Leg Performed By: Clinician Epps, Carrie, RN Compression Type: Three Layer Post Procedure Diagnosis Same as Pre-procedure Electronic Signature(s) Signed: 09/21/2020 3:51:10 PM By: Epps, Carrie RN Entered By: Epps, Carrie on 09/16/2020 09:00:44 Fincher, Chapman S. ([id number]) -------------------------------------------------------------------------------- Encounter Discharge Information Details Patient Name: Tyler, Ammaar S. Date of Service: 09/16/2020 8:30 AM Medical Record Number: [id number] Patient Account Number: [id number] Date of Birth/Sex: 03/19/1961 (60 y.o. M) Treating RN: Bishop, Joy Primary Care Provider: Lalonde, John Other Clinician: Referring Provider: Lalonde, John Treating Provider/Extender: Stone, Hoyt Weeks in Treatment: 5 Encounter Discharge Information Items Discharge Condition: Stable Ambulatory Status: Ambulatory Discharge Destination: Home Transportation: Private Auto Accompanied By: self Schedule Follow-up Appointment: Yes Clinical Summary of Care: Electronic Signature(s) Signed: 09/16/2020 4:01:56 PM By: Bishop, Joy Entered By: Bishop, Joy on 09/16/2020 09:26:42 Hertzog, Jourdan S. ([id number]) -------------------------------------------------------------------------------- Lower Extremity Assessment Details Patient Name: Tyler, Goebel  S. Date of Service: 09/16/2020 8:30 AM Medical Record Number: [id number] Patient Account Number: [id number] Date of Birth/Sex: 11/06/1960 (60 y.o. M) Treating RN: Bishop, Joy Primary Care Provider: Lalonde, John Other Clinician: Referring Provider: Lalonde, John Treating Provider/Extender: Stone, Hoyt Weeks in Treatment: 5 Edema Assessment Assessed: [Left: Yes] [Right: No] [Left: Edema] [Right: :] Calf Left: Right: Point of Measurement: 33 cm From Medial Instep 31 cm Ankle Left: Right: Point of Measurement: 12 cm From Medial Instep 19.5 cm Knee To Floor Left: Right: From Medial Instep 40 cm Vascular Assessment Pulses: Dorsalis Pedis Palpable: [Left:Yes] Electronic Signature(s) Signed: 09/16/2020 4:01:56 PM By: Bishop, Joy Entered By: Bishop, Joy on 09/16/2020 08:47:02 Dobias, Navjot S. ([id number]) -------------------------------------------------------------------------------- Multi Wound Chart Details Patient Name: Bruins, Marton S. Date of Service: 09/16/2020 8:30 AM Medical Record Number: [id number] Patient Account Number: [id number] Date of Birth/Sex: 05/11/1961 (60 y.o. M) Treating RN: Epps, Carrie Primary Care Provider: Lalonde, John Other Clinician: Referring Provider: Lalonde, John Treating Provider/Extender: Stone, Hoyt Weeks in Treatment: 5 Vital Signs Height(in): 65 Pulse(bpm): 51 Weight(lbs): 155 Blood Pressure(mmHg): 122/71 Body Mass Index(BMI): 26 Temperature(°F): 98.2 Respiratory Rate(breaths/min): 18 Photos: [N/A:N/A] Wound Location: Left, Medial Lower Leg N/A N/A Wounding Event: Trauma N/A N/A Primary Etiology: Venous Leg Ulcer N/A N/A Comorbid History: Glaucoma, Coronary Artery N/A N/A Disease, Hypertension, Neuropathy, Received Chemotherapy Date Acquired: 03/09/2020 N/A N/A Weeks of Treatment: 5 N/A N/A Wound Status: Open N/A N/A Clustered Wound: Yes N/A N/A Measurements L x W x D (cm) 0.3x0.5x0.1 N/A N/A Area (cm) : 0.118 N/A N/A Volume (cm) :  0.012 N/A N/A % Reduction in Area: 98.10% N/A N/A % Reduction in Volume: 98.00% N/A N/A Classification: Full Thickness Without Exposed N/A N/A Support Structures Exudate Amount: Small N/A N/A Exudate Type: Serous N/A N/A Exudate Color: amber N/A N/A Wound Margin: Flat and Intact N/A N/A Granulation Amount: Large (67-100%) N/A N/A Granulation Quality: Red, Pink N/A N/A Necrotic Amount: None Present (0%) N/A N/A Exposed Structures: Fat Layer (Subcutaneous Tissue): N/A N/A Yes Fascia: No Tendon: No Muscle: No Joint: No Bone: No Epithelialization: Small (1-33%) N/A N/A Treatment Notes Electronic Signature(s) Signed: 09/21/2020 3:51:10 PM By: Epps, Carrie RN Entered By: Epps, Carrie on 09/16/2020 09:00:00 Lavis, Keon S. ([id number]) Allport, Gianluca S. ([id number]) -------------------------------------------------------------------------------- Multi-Disciplinary Care   Plan Details Patient Name: Croslin, Kentrel S. Date of Service: 09/16/2020 8:30 AM Medical Record Number: [id number] Patient Account Number: [id number] Date of Birth/Sex: 10/15/1960 (60 y.o. M) Treating RN: Epps, Carrie Primary Care Provider: Lalonde, John Other Clinician: Referring Provider: Lalonde, John Treating Provider/Extender: Stone, Hoyt Weeks in Treatment: 5 Active Inactive Wound/Skin Impairment Nursing Diagnoses: Impaired tissue integrity Goals: Patient/caregiver will verbalize understanding of skin care regimen Date Initiated: 08/09/2020 Target Resolution Date: 10/09/2020 Goal Status: Active Ulcer/skin breakdown will have a volume reduction of 30% by week 4 Date Initiated: 08/09/2020 Date Inactivated: 09/16/2020 Target Resolution Date: 09/09/2020 Goal Status: Met Ulcer/skin breakdown will have a volume reduction of 50% by week 8 Date Initiated: 08/09/2020 Target Resolution Date: 10/09/2020 Goal Status: Active Ulcer/skin breakdown will have a volume reduction of 80% by week 12 Date Initiated: 08/09/2020 Target  Resolution Date: 11/09/2020 Goal Status: Active Ulcer/skin breakdown will heal within 14 weeks Date Initiated: 08/09/2020 Target Resolution Date: 12/09/2020 Goal Status: Active Interventions: Assess patient/caregiver ability to obtain necessary supplies Assess patient/caregiver ability to perform ulcer/skin care regimen upon admission and as needed Assess ulceration(s) every visit Provide education on ulcer and skin care Treatment Activities: Skin care regimen initiated : 08/09/2020 Notes: Electronic Signature(s) Signed: 09/21/2020 3:51:10 PM By: Epps, Carrie RN Entered By: Epps, Carrie on 09/16/2020 08:59:52 Osbourne, Heinrich S. ([id number]) -------------------------------------------------------------------------------- Pain Assessment Details Patient Name: Bueno, Romaldo S. Date of Service: 09/16/2020 8:30 AM Medical Record Number: [id number] Patient Account Number: [id number] Date of Birth/Sex: 04/06/1961 (60 y.o. M) Treating RN: Bishop, Joy Primary Care Provider: Lalonde, John Other Clinician: Referring Provider: Lalonde, John Treating Provider/Extender: Stone, Hoyt Weeks in Treatment: 5 Active Problems Location of Pain Severity and Description of Pain Patient Has Paino Yes Site Locations Rate the pain. Current Pain Level: 3 Pain Management and Medication Current Pain Management: Electronic Signature(s) Signed: 09/16/2020 4:01:56 PM By: Bishop, Joy Entered By: Bishop, Joy on 09/16/2020 08:40:22 Matsumura, Jerric S. ([id number]) -------------------------------------------------------------------------------- Patient/Caregiver Education Details Patient Name: Poole, Finbar S. Date of Service: 09/16/2020 8:30 AM Medical Record Number: [id number] Patient Account Number: [id number] Date of Birth/Gender: 01/07/1961 (60 y.o. M) Treating RN: Epps, Carrie Primary Care Physician: Lalonde, John Other Clinician: Referring Physician: Lalonde, John Treating Physician/Extender: Stone, Hoyt Weeks in Treatment:  5 Education Assessment Education Provided To: Patient Education Topics Provided Wound/Skin Impairment: Methods: Explain/Verbal Responses: State content correctly Electronic Signature(s) Signed: 09/21/2020 3:51:10 PM By: Epps, Carrie RN Entered By: Epps, Carrie on 09/16/2020 09:03:51 Halt, Mickell S. ([id number]) -------------------------------------------------------------------------------- Wound Assessment Details Patient Name: Whirley, Damarie S. Date of Service: 09/16/2020 8:30 AM Medical Record Number: [id number] Patient Account Number: [id number] Date of Birth/Sex: 09/20/1960 (60 y.o. M) Treating RN: Bishop, Joy Primary Care Provider: Lalonde, John Other Clinician: Referring Provider: Lalonde, John Treating Provider/Extender: Stone, Hoyt Weeks in Treatment: 5 Wound Status Wound Number: 1 Primary Venous Leg Ulcer Etiology: Wound Location: Left, Medial Lower Leg Wound Open Wounding Event: Trauma Status: Date Acquired: 03/09/2020 Comorbid Glaucoma, Coronary Artery Disease, Hypertension, Weeks Of Treatment: 5 History: Neuropathy, Received Chemotherapy Clustered Wound: Yes Photos Wound Measurements Length: (cm) 0.3 Width: (cm) 0.5 Depth: (cm) 0.1 Area: (cm) 0.118 Volume: (cm) 0.012 % Reduction in Area: 98.1% % Reduction in Volume: 98% Epithelialization: Small (1-33%) Tunneling: No Undermining: No Wound Description Classification: Full Thickness Without Exposed Support Structures Wound Margin: Flat and Intact Exudate Amount: Small Exudate Type: Serous Exudate Color: amber Foul Odor After Cleansing: No Slough/Fibrino No Wound Bed Granulation Amount: Large (67-100%) Exposed Structure Granulation Quality: Red, Pink Fascia Exposed: No Necrotic Amount:   None Present (0%) Fat Layer (Subcutaneous Tissue) Exposed: Yes Tendon Exposed: No Muscle Exposed: No Joint Exposed: No Bone Exposed: No Treatment Notes Wound #1 (Lower Leg) Wound Laterality: Left,  Medial Cleanser Normal Saline Discharge Instruction: Wash your hands with soap and water. Remove old dressing, discard into plastic bag and place into trash. Cleanse the wound with Normal Saline prior to applying a clean dressing using gauze sponges, not tissues or cotton balls. Do not scrub or use excessive force. Pat dry using gauze sponges, not tissue or cotton balls. Doetsch, Trenden S. ([id number]) Peri-Wound Care Desitin Maximum Strength Ointment 4 (oz) Topical Primary Dressing Curad Oil Emulsion Dressing 3x3 (in/in) Discharge Instruction: apply to wound bed Secondary Dressing ABD Pad 5x9 (in/in) Discharge Instruction: Cover with ABD pad Secured With Compression Wrap Profore Lite LF 3 Multilayer Compression Bandaging System Discharge Instruction: Apply 3 multi-layer wrap as prescribed. Compression Stockings Add-Ons Electronic Signature(s) Signed: 09/16/2020 4:01:56 PM By: Donnamarie Poag Entered By: Donnamarie Poag on 09/16/2020 08:45:54 Mcmanigal, Maurio S. ([id number]) -------------------------------------------------------------------------------- Vitals Details Patient Name: [name], Sosaia S. Date of Service: 09/16/2020 8:30 AM Medical Record Number: [id number] Patient Account Number: [id number] Date of Birth/Sex: 01-21-1961 (60 y.o. M) Treating RN: Donnamarie Poag Primary Care Pepper Wyndham: Jill Alexanders Other Clinician: Referring Jayonna Meyering: Jill Alexanders Treating Shailene Demonbreun/Extender: Skipper Cliche in Treatment: 5 Vital Signs Time Taken: 08:41 Temperature (F): 98.2 Height (in): 65 Pulse (bpm): 51 Weight (lbs): 155 Respiratory Rate (breaths/min): 18 Body Mass Index (BMI): 25.8 Blood Pressure (mmHg): 122/71 Reference Range: 80 - 120 mg / dl Electronic Signature(s) Signed: 09/16/2020 4:01:56 PM By: Donnamarie Poag Entered ByDonnamarie Poag on 09/16/2020 08:40:09

## 2020-09-23 ENCOUNTER — Encounter: Payer: BC Managed Care – PPO | Admitting: Internal Medicine

## 2020-09-23 ENCOUNTER — Other Ambulatory Visit: Payer: Self-pay

## 2020-09-23 DIAGNOSIS — I890 Lymphedema, not elsewhere classified: Secondary | ICD-10-CM | POA: Diagnosis not present

## 2020-09-23 DIAGNOSIS — I872 Venous insufficiency (chronic) (peripheral): Secondary | ICD-10-CM | POA: Diagnosis not present

## 2020-09-23 DIAGNOSIS — I10 Essential (primary) hypertension: Secondary | ICD-10-CM | POA: Diagnosis not present

## 2020-09-23 DIAGNOSIS — L97822 Non-pressure chronic ulcer of other part of left lower leg with fat layer exposed: Secondary | ICD-10-CM | POA: Diagnosis not present

## 2020-09-23 DIAGNOSIS — D89811 Chronic graft-versus-host disease: Secondary | ICD-10-CM | POA: Diagnosis not present

## 2020-09-24 NOTE — Progress Notes (Signed)
LIPA, KNAUFF ([id number]) Visit Report for 09/23/2020 HPI Details Patient Name: [name], [name]. Date of Service: 09/23/2020 8:30 AM Medical Record Number: [id number] Patient Account Number: [id number] Date of Birth/Sex: 07-Jan-1961 (60 y.o. M) Treating RN: Carlene Coria Primary Care Provider: Jill Alexanders Other Clinician: Referring Provider: Jill Alexanders Treating Provider/Extender: Tito Dine in Treatment: 6 History of Present Illness HPI Description: 08/09/2020 upon evaluation today patient appears to be doing somewhat poorly in regard to his left lower extremity. Subsequently he on January 14 was seen by the [facility] wound care center. It appears that they actually put him in a 4-layer compression wrap along with [facility] he tells me that felt pretty good but again that is quite a drive for him to get there. He mainly was being seen there is that is where his cancer doctor is. With that being said the patient tells me currently that he is having some discomfort at the site but is not terrible. He has been tolerating the dressing changes without complication which is great news. 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 He does seem to have potentially some issues with regulating his temperature which is consistent with this process. We saw that today as well with his low vital sign temperature reading. The initial injury actually occurred when the patient dropped asphalt which landed on the anterior portion of his leg causing the injury. The good news is this does not appear to be too deep which hopefully should bode well for rapid healing. 08/16/2020 on evaluation today patient appears to be doing well with regard to his wounds currently. In fact the wound appears to be about half the size of what it was last week and overall I am extremely pleased with where things stand. Fortunately there is no signs of  active infection at this time. No fevers, chills, nausea, vomiting, or diarrhea. 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 We have been using Hydrofera Blue under 3 layer compression 4/6; continued improvement using Hydrofera Blue under 3 layer compression 09/09/2020 upon evaluation today patient appears to be doing well with regard to his wound. We have been using Hydrofera Blue which has done well but he seems to have may be stalled this a little bit I think this may be getting stuck to the wound bed is the main issue. Fortunately there does not appear to be any signs of active infection which is great news overall very pleased with where things stand today. 09/16/2020 upon evaluation today patient appears to be doing well with regard to his leg ulcers. In fact there is very little open although he still has a little bit of weeping that I see. There was not a tremendous amount of drainage over the past week. Fortunately I think he is doing quite well. 4/29; left medial calf in the setting of chronic venous insufficiency hemosiderin deposition. Looking at the pictures from last week this wound is gotten smaller although the area still looks somewhat moist. Started on oil emulsion under 3 layer compression last week Electronic Signature(s) Signed: 09/23/2020 4:40:00 PM By: Linton Ham MD Entered By: Linton Ham on 09/23/2020 09:11:33 [name], [name] ([id number]) -------------------------------------------------------------------------------- Physical Exam Details Patient Name: [name], [name] Date of Service: 09/23/2020 8:30 AM Medical Record Number: [id number] Patient Account Number: [id number] Date of Birth/Sex: 02/17/1961 (60 y.o. M) Treating RN: Carlene Coria Primary  Care Provider: Jill Alexanders Other Clinician: Referring Provider: Jill Alexanders Treating Provider/Extender: Tito Dine in Treatment: 6 Cardiovascular Pedal  pulses are palpable. Notes Wound exam; only a tiny superficial circular area remains. The rest of this is epithelialized. It looks somewhat moist to me however the wound dimensions have improved versus last week. Everything is superficial no evidence of surrounding infection. Pedal pulses and popliteal pulses palpable Electronic Signature(s) Signed: 09/23/2020 4:40:00 PM By: Linton Ham MD Entered By: Linton Ham on 09/23/2020 09:12:43 [place], [address] ([id number]) -------------------------------------------------------------------------------- Physician Orders Details Patient Name: [name], [name] Date of Service: 09/23/2020 8:30 AM Medical Record Number: [id number] Patient Account Number: [id number] Date of Birth/Sex: 1961/03/11 (60 y.o. M) Treating RN: Carlene Coria Primary Care Provider: Jill Alexanders Other Clinician: Referring Provider: Jill Alexanders Treating Provider/Extender: Tito Dine in Treatment: 6 Verbal / Phone Orders: No Diagnosis Coding Follow-up Appointments o Return Appointment in 1 week. Bathing/ Shower/ Hygiene o May shower with wound dressing protected with water repellent cover or cast protector. o No tub bath. Edema Control - Lymphedema / Segmental Compressive Device / Other Left Lower Extremity o Optional: One layer of unna paste to top of compression wrap (to act as an anchor). o Elevate, Exercise Daily and Avoid Standing for Long Periods of Time. o Elevate legs to the level of the heart and pump ankles as often as possible o Elevate leg(s) parallel to the floor when sitting. Wound Treatment Wound #1 - Lower Leg Wound Laterality: Left, Medial Cleanser: Normal Saline 1 x Per Week/30 Days Discharge Instructions: Wash your hands with soap and water. Remove old dressing, discard into plastic bag and place into trash. Cleanse the wound with Normal Saline prior to applying a clean dressing using gauze sponges, not tissues or cotton balls.  Do not scrub or use excessive force. Pat dry using gauze sponges, not tissue or cotton balls. Peri-Wound Care: Desitin Maximum Strength Ointment 4 (oz) (Generic) 1 x Per Week/30 Days Primary Dressing: Curad Oil Emulsion Dressing 3x3 (in/in) 1 x Per Week/30 Days Discharge Instructions: apply to wound bed Secondary Dressing: ABD Pad 5x9 (in/in) 1 x Per Week/30 Days Discharge Instructions: Cover with ABD pad Compression Wrap: Profore Lite LF 3 Multilayer Compression Bandaging System 1 x Per Week/30 Days Discharge Instructions: Apply 3 multi-layer wrap as prescribed. Electronic Signature(s) Signed: 09/23/2020 4:40:00 PM By: Linton Ham MD Signed: 09/23/2020 5:33:58 PM By: Carlene Coria RN Entered By: Carlene Coria on 09/23/2020 09:08:18 [place], [place]. ([id number]) -------------------------------------------------------------------------------- Problem List Details Patient Name: [name], [name] Date of Service: 09/23/2020 8:30 AM Medical Record Number: [id number] Patient Account Number: [id number] Date of Birth/Sex: 1961/04/27 (60 y.o. M) Treating RN: Carlene Coria Primary Care Provider: Jill Alexanders Other Clinician: Referring Provider: Jill Alexanders Treating Provider/Extender: Tito Dine in Treatment: 6 Active Problems ICD-10 Encounter Code Description Active Date MDM Diagnosis I87.2 Venous insufficiency (chronic) (peripheral) 08/09/2020 No Yes L97.822 Non-pressure chronic ulcer of other part of left lower leg with fat layer 08/09/2020 No Yes exposed [place] (primary) hypertension 08/09/2020 No Yes I25.10 Atherosclerotic heart disease of native coronary artery without angina 08/09/2020 No Yes pectoris R68.0 Hypothermia, not associated with low environmental temperature 08/09/2020 No Yes Inactive Problems Resolved Problems Electronic Signature(s) Signed: 09/23/2020 4:40:00 PM By: Linton Ham MD Entered By: Linton Ham on 09/23/2020 09:10:49 [place], [place].  ([id number]) -------------------------------------------------------------------------------- Progress Note Details Patient Name: [name], [name] Date of Service: 09/23/2020 8:30 AM Medical Record Number: [id number] Patient Account Number: [id number] Date of  Birth/Sex: 03/23/1961 (60 y.o. M) Treating RN: Carlene Coria Primary Care Provider: Jill Alexanders Other Clinician: Referring Provider: Jill Alexanders Treating Provider/Extender: Tito Dine in Treatment: 6 Subjective History of Present Illness (HPI) 08/09/2020 upon evaluation today patient appears to be doing somewhat poorly in regard to his left lower extremity. Subsequently he on January 14 was seen by the [facility] wound care center. It appears that they actually put him in a 4-layer compression wrap along with [facility] he tells me that felt pretty good but again that is quite a drive for him to get there. He mainly was being seen there is that is where his cancer doctor is. With that being said the patient tells me currently that he is having some discomfort at the site but is not terrible. He has been tolerating the dressing changes without complication which is great news. 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 He does seem to have potentially some issues with regulating his temperature which is consistent with this process. We saw that today as well with his low vital sign temperature reading. The initial injury actually occurred when the patient dropped asphalt which landed on the anterior portion of his leg causing the injury. The good news is this does not appear to be too deep which hopefully should bode well for rapid healing. 08/16/2020 on evaluation today patient appears to be doing well with regard to his wounds currently. In fact the wound appears to be about half the size of what it was last week and overall I am extremely pleased with  where things stand. Fortunately there is no signs of active infection at this time. No fevers, chills, nausea, vomiting, or diarrhea. 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 We have been using Hydrofera Blue under 3 layer compression 4/6; continued improvement using Hydrofera Blue under 3 layer compression 09/09/2020 upon evaluation today patient appears to be doing well with regard to his wound. We have been using Hydrofera Blue which has done well but he seems to have may be stalled this a little bit I think this may be getting stuck to the wound bed is the main issue. Fortunately there does not appear to be any signs of active infection which is great news overall very pleased with where things stand today. 09/16/2020 upon evaluation today patient appears to be doing well with regard to his leg ulcers. In fact there is very little open although he still has a little bit of weeping that I see. There was not a tremendous amount of drainage over the past week. Fortunately I think he is doing quite well. 4/29; left medial calf in the setting of chronic venous insufficiency hemosiderin deposition. Looking at the pictures from last week this wound is gotten smaller although the area still looks somewhat moist. Started on oil emulsion under 3 layer compression last week Objective Constitutional Vitals Time Taken: 8:48 AM, Height: 65 in, Weight: 155 lbs, BMI: 25.8, Temperature: 97.9 F, Pulse: 47 bpm, Respiratory Rate: 18 breaths/min, Blood Pressure: 109/59 mmHg. Cardiovascular Pedal pulses are palpable. General Notes: Wound exam; only a tiny superficial circular area remains. The rest of this is epithelialized. It looks somewhat moist to me however the wound dimensions have improved versus last week. Everything is superficial no evidence of surrounding infection. Pedal pulses and popliteal pulses palpable Integumentary (Hair, Skin) Wound #1  status is  Open. Original cause of wound was Trauma. The date acquired was: 03/09/2020. The wound has been in treatment 6 weeks. The wound is located on the Left,Medial Lower Leg. The wound measures 1cm length x 2cm width x 0.1cm depth; 1.571cm^2 area and 0.157cm^3 volume. There is Fat Layer (Subcutaneous Tissue) exposed. There is no tunneling or undermining noted. There is a small amount of serous drainage noted. The wound margin is flat and intact. There is large (67-100%) red, pink granulation within the wound bed. There is no necrotic tissue within the wound bed. Assessment [name], [name] ([id number]) Active Problems ICD-10 Venous insufficiency (chronic) (peripheral) Non-pressure chronic ulcer of other part of left lower leg with fat layer exposed [place] (primary) hypertension Atherosclerotic heart disease of native coronary artery without angina pectoris Hypothermia, not associated with low environmental temperature Procedures Wound #1 Pre-procedure diagnosis of Wound #1 is a Venous Leg Ulcer located on the Left,Medial Lower Leg . There was a Three Layer Compression Therapy Procedure by Carlene Coria, RN. Post procedure Diagnosis Wound #1: Same as Pre-Procedure Plan Follow-up Appointments: Return Appointment in 1 week. Bathing/ Shower/ Hygiene: May shower with wound dressing protected with water repellent cover or cast protector. No tub bath. Edema Control - Lymphedema / Segmental Compressive Device / Other: Optional: One layer of unna paste to top of compression wrap (to act as an anchor). Elevate, Exercise Daily and Avoid Standing for Long Periods of Time. Elevate legs to the level of the heart and pump ankles as often as possible Elevate leg(s) parallel to the floor when sitting. WOUND #1: - Lower Leg Wound Laterality: Left, Medial Cleanser: Normal Saline 1 x Per Week/30 Days Discharge Instructions: Wash your hands with soap and water. Remove old dressing, discard into plastic  bag and place into trash. Cleanse the wound with Normal Saline prior to applying a clean dressing using gauze sponges, not tissues or cotton balls. Do not scrub or use excessive force. Pat dry using gauze sponges, not tissue or cotton balls. Peri-Wound Care: Desitin Maximum Strength Ointment 4 (oz) (Generic) 1 x Per Week/30 Days Primary Dressing: Curad Oil Emulsion Dressing 3x3 (in/in) 1 x Per Week/30 Days Discharge Instructions: apply to wound bed Secondary Dressing: ABD Pad 5x9 (in/in) 1 x Per Week/30 Days Discharge Instructions: Cover with ABD pad Compression Wrap: Profore Lite LF 3 Multilayer Compression Bandaging System 1 x Per Week/30 Days Discharge Instructions: Apply 3 multi-layer wrap as prescribed. 1. Dimensions of this wound are improving, therefore no changes after 1 week to the dressings. 2. He still does not have his compression stockings although I asked him to get these this week in the eventuality that this may be closed by next week. Electronic Signature(s) Signed: 09/23/2020 4:40:00 PM By: Linton Ham MD Entered By: Linton Ham on 09/23/2020 09:13:23 [name], [name] ([id number]) -------------------------------------------------------------------------------- SuperBill Details Patient Name: [name], [name] Date of Service: 09/23/2020 Medical Record Number: [id number] Patient Account Number: [id number] Date of Birth/Sex: 1961/04/04 (60 y.o. 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 pectoris R68.0 Hypothermia, not associated with  low environmental temperature Facility Procedures CPT4 Code: [id number] Description: (Facility Use Only) [phone] - [place]  Modifier: Quantity: 1 Physician Procedures CPT4 Code: 6283662 Description: 94765 - WC PHYS LEVEL 3 - EST PT Modifier: Quantity: 1 CPT4 Code: Description: ICD-10 Diagnosis Description L97.822 Non-pressure chronic ulcer of other part of left lower leg with fat lay I87.2 Venous insufficiency (chronic) (peripheral) Modifier: er exposed Quantity: Electronic Signature(s) Signed: 09/23/2020 4:40:00 PM By: Linton Ham MD Entered By: Linton Ham on 09/23/2020 09:13:44

## 2020-09-24 NOTE — Progress Notes (Signed)
AMITAI, DELAUGHTER ([id number]) Visit Report for 09/23/2020 Arrival Information Details Patient Name: [name], [name]. Date of Service: 09/23/2020 8:30 AM Medical Record Number: [id number] Patient Account Number: [id number] Date of Birth/Sex: [birth year] (60 y.o. M) Treating RN: Carlene Coria Primary Care Maudine Kluesner: Jill Alexanders Other Clinician: Referring Mertha Clyatt: Jill Alexanders Treating Raney Koeppen/Extender: Tito Dine in Treatment: 6 Visit Information History Since Last Visit Added or deleted any medications: No Patient Arrived: Ambulatory Had a fall or experienced change in No Arrival Time: 08:48 activities of daily living that may affect Accompanied By: self risk of falls: Transfer Assistance: None Hospitalized since last visit: No Patient Identification Verified: Yes Pain Present Now: No Secondary Verification Process Completed: Yes Patient Requires Transmission-Based No Precautions: Patient Has Alerts: Yes Patient Alerts: Patient on Blood Thinner NOT diabetic Electronic Signature(s) Signed: 09/23/2020 3:13:24 PM By: Jeanine Luz Entered By: Jeanine Luz on 09/23/2020 08:48:32 Mullet, Jevin S. ([id number]) -------------------------------------------------------------------------------- Clinic Level of Care Assessment Details Patient Name: [name], [name] Date of Service: 09/23/2020 8:30 AM Medical Record Number: [id number] Patient Account Number: [id number] Date of Birth/Sex: 28-Feb-1961 (60 y.o. M) Treating RN: Carlene Coria Primary Care Tobe Kervin: Jill Alexanders Other Clinician: Referring Erikson Danzy: Jill Alexanders Treating Ysabela Keisler/Extender: Tito Dine in Treatment: 6 Clinic Level of Care Assessment Items TOOL 1 Quantity Score _0  - Use when EandM and Procedure is performed on INITIAL visit 0 ASSESSMENTS - Nursing Assessment / Reassessment _1  - General Physical Exam (combine w/ comprehensive assessment (listed just below) when performed on new 0 pt. evals) _2   - 0 Comprehensive Assessment (HX, ROS, Risk Assessments, Wounds Hx, etc.) ASSESSMENTS - Tyler and Skin Assessment / Reassessment _3  - Dermatologic / Skin Assessment (not related to Tyler area) 0 ASSESSMENTS - Ostomy and/or Continence Assessment and Care _4  - Incontinence Assessment and Management 0 _5  - 0 Ostomy Care Assessment and Management (repouching, etc.) PROCESS - Coordination of Care _6  - Simple Patient / Family Education for ongoing care 0 _7  - 0 Complex (extensive) Patient / Family Education for ongoing care _8  - 0 Staff obtains Programmer, systems, Records, Test Results / Process Orders _9  - 0 Staff telephones HHA, Nursing Homes / Clarify orders / etc _10  - 0 Routine Transfer to another Facility (non-emergent condition) _11  - 0 Routine Hospital Admission (non-emergent condition) _12  - 0 New Admissions / Biomedical engineer / Ordering NPWT, Apligraf, etc. _13  - 0 Emergency Hospital Admission (emergent condition) PROCESS - Special Needs _14  - Pediatric / Minor Patient Management 0 _15  - 0 Isolation Patient Management _16  - 0 Hearing / Language / Visual special needs _17  - 0 Assessment of Community assistance (transportation, D/C planning, etc.) _18  - 0 Additional assistance / Altered mentation _19  - 0 Support Surface(s) Assessment (bed, cushion, seat, etc.) INTERVENTIONS - Miscellaneous _20  - External ear exam 0 _21  - 0 Patient Transfer (multiple staff / Civil Service fast streamer / Similar devices) _22  - 0 Simple Staple / Suture removal (25 or less) _23  - 0 Complex Staple / Suture removal (26 or more) _24  - 0 Hypo/Hyperglycemic Management (do not check if billed separately) _25  - 0 Ankle / Brachial Index (ABI) - do not check if billed separately Has the patient been seen at the hospital within the last three years: Yes Total Score: 0 Level Of Care: ____ [name] ([id number]) Electronic Signature(s) Signed: 09/23/2020 5:33:58 PM By: Carlene Coria RN Entered By: Carlene Coria on 09/23/2020  09:08:28 Barfuss, [place] S. ([id number]) -------------------------------------------------------------------------------- Compression Therapy Details Patient Name: [name], [name] Date of Service: 09/23/2020  8:30 AM Medical Record Number: [id number] Patient Account Number: [id number] Date of Birth/Sex: [birth year] (60 y.o. M) Treating RN: Carlene Coria Primary Care Madai Nuccio: Jill Alexanders Other Clinician: Referring Wyolene Weimann: Jill Alexanders Treating Murielle Stang/Extender: Tito Dine in Treatment: 6 Compression Therapy Performed for Tyler Assessment: Tyler #1 Li,[name] Lower Leg Performed By: Clinician Carlene Coria, RN Compression Type: Three Layer Post Procedure Diagnosis Same as Pre-procedure Electronic Signature(s) Signed: 09/23/2020 5:33:58 PM By: Carlene Coria RN Entered By: Carlene Coria on 09/23/2020 09:07:50 Ehresman, [name] ([id number]) -------------------------------------------------------------------------------- Encounter Discharge Information Details Patient Name: [name], [name] Date of Service: 09/23/2020 8:30 AM Medical Record Number: [id number] Patient Account Number: [id number] Date of Birth/Sex: Feb 07, 1961 (60 y.o. M) Treating RN: Donnamarie Poag Primary Care Surina Storts: Jill Alexanders Other Clinician: Referring Vrishank Moster: Jill Alexanders Treating Najeeb Uptain/Extender: Tito Dine in Treatment: 6 Encounter Discharge Information Items Discharge Condition: Stable Ambulatory Status: Ambulatory Discharge Destination: Home Transportation: Private Auto Accompanied By: self Schedule Follow-up Appointment: Yes Clinical Summary of Care: Electronic Signature(s) Signed: 09/23/2020 4:40:42 PM By: Donnamarie Poag Entered By: Donnamarie Poag on 09/23/2020 09:24:37 Lanese, [name] ([id number]) -------------------------------------------------------------------------------- Lower Extremity Assessment Details Patient Name: [name], [name] Date of Service: 09/23/2020 8:30 AM Medical Record Number:  [id number] Patient Account Number: [id number] Date of Birth/Sex: 01-11-1961 (60 y.o. M) Treating RN: Carlene Coria Primary Care Gilda Abboud: Jill Alexanders Other Clinician: Referring Zyasia Halbleib: Jill Alexanders Treating Jadzia Ibsen/Extender: Tito Dine in Treatment: 6 Edema Assessment Assessed: [Li: No] [Right: No] [Li: Edema] [Right: :] Calf Li: Right: Point of Measurement: 33 cm From [name] Instep 31.2 cm Ankle Li: Right: Point of Measurement: 12 cm From [name] Instep 19.8 cm Knee To Floor Li: Right: From [name] Instep 42 cm Vascular Assessment Pulses: Dorsalis Pedis Palpable: [Li:Yes] Electronic Signature(s) Signed: 09/23/2020 4:40:42 PM By: Donnamarie Poag Signed: 09/23/2020 5:33:58 PM By: Carlene Coria RN Entered By: Donnamarie Poag on 09/23/2020 09:13:15 Emanuel, [place]. ([id number]) -------------------------------------------------------------------------------- Multi Tyler Chart Details Patient Name: [name], [name] Date of Service: 09/23/2020 8:30 AM Medical Record Number: [id number] Patient Account Number: [id number] Date of Birth/Sex: [birth year] (60 y.o. M) Treating RN: Carlene Coria Primary Care Kippy Melena: Jill Alexanders Other Clinician: Referring Marajade Lei: Jill Alexanders Treating Ashley Montminy/Extender: Tito Dine in Treatment: 6 Vital Signs Height(in): 65 Pulse(bpm): 6 Weight(lbs): 155 Blood Pressure(mmHg): 109/59 Body Mass Index(BMI): 26 Temperature(F): 97.9 Respiratory Rate(breaths/min): 18 Photos: [N/A:N/A] Tyler Location: Li, [name] Lower Leg N/A N/A Wounding Event: Trauma N/A N/A Primary Etiology: Venous Leg Ulcer N/A N/A Comorbid History: Glaucoma, Coronary Artery N/A N/A Disease, Hypertension, Neuropathy, Received Chemotherapy Date Acquired: 03/09/2020 N/A N/A Weeks of Treatment: 6 N/A N/A Tyler Status: Open N/A N/A Clustered Tyler: Yes N/A N/A Measurements L x W x D (cm) 1x2x0.1 N/A N/A Area (cm) : 1.571 N/A N/A Volume (cm) : 0.157  N/A N/A % Reduction in Area: 74.20% N/A N/A % Reduction in Volume: 74.20% N/A N/A Classification: Full Thickness Without Exposed N/A N/A Support Structures Exudate Amount: Small N/A N/A Exudate Type: Serous N/A N/A Exudate Color: amber N/A N/A Tyler Margin: Flat and Intact N/A N/A Granulation Amount: Large (67-100%) N/A N/A Granulation Quality: Red, Pink N/A N/A Necrotic Amount: None Present (0%) N/A N/A Exposed Structures: Fat Layer (Subcutaneous Tissue): N/A N/A Yes Fascia: No Tendon: No Muscle: No Joint: No Bone: No Epithelialization: Small (1-33%) N/A N/A Procedures Performed: Compression Therapy N/A N/A Treatment Notes Electronic Signature(s) Signed: 09/23/2020 4:40:00 PM By: Linton Ham MD Entered By: Linton Ham on 09/23/2020 09:10:56 [employer], [name] ([id number]) [place],  [name] ([id number]) -------------------------------------------------------------------------------- [place] Details Patient Name: VILLAMIZAR, [name] Date of Service: 09/23/2020 8:30 AM Medical Record Number: [id number] Patient Account Number: [id number] Date of Birth/Sex: 03-19-1961 (60 y.o. M) Treating RN: Carlene Coria Primary Care Timtohy Broski: Jill Alexanders Other Clinician: Referring Lealand Elting: Jill Alexanders Treating Lowella Kindley/Extender: Tito Dine in Treatment: 6 Active Inactive Tyler/Skin Impairment Nursing Diagnoses: Impaired tissue integrity Goals: Patient/caregiver will verbalize understanding of skin care regimen Date Initiated: 08/09/2020 Target Resolution Date: 10/09/2020 Goal Status: Active Ulcer/skin breakdown will have a volume reduction of 30% by week 4 Date Initiated: 08/09/2020 Date Inactivated: 09/16/2020 Target Resolution Date: 09/09/2020 Goal Status: Met Ulcer/skin breakdown will have a volume reduction of 50% by week 8 Date Initiated: 08/09/2020 Target Resolution Date: 10/09/2020 Goal Status: Active Ulcer/skin breakdown will have a volume reduction of  80% by week 12 Date Initiated: 08/09/2020 Target Resolution Date: 11/09/2020 Goal Status: Active Ulcer/skin breakdown will heal within 14 weeks Date Initiated: 08/09/2020 Target Resolution Date: 12/09/2020 Goal Status: Active Interventions: Assess patient/caregiver ability to obtain necessary supplies Assess patient/caregiver ability to perform ulcer/skin care regimen upon admission and as needed Assess ulceration(s) every visit Provide education on ulcer and skin care Treatment Activities: Skin care regimen initiated : 08/09/2020 Notes: Electronic Signature(s) Signed: 09/23/2020 5:33:58 PM By: Carlene Coria RN Entered By: Carlene Coria on 09/23/2020 09:06:28 Bisch, Amish S. ([id number]) -------------------------------------------------------------------------------- Pain Assessment Details Patient Name: [name], [name] Date of Service: 09/23/2020 8:30 AM Medical Record Number: [id number] Patient Account Number: [id number] Date of Birth/Sex: 18-Apr-1961 (60 y.o. M) Treating RN: Carlene Coria Primary Care Marlo Goodrich: Jill Alexanders Other Clinician: Referring Katlynn Naser: Jill Alexanders Treating Ameliah Baskins/Extender: Tito Dine in Treatment: 6 Active Problems Location of Pain Severity and Description of Pain Patient Has Paino No Site Locations Rate the pain. Current Pain Level: 0 Pain Management and Medication Current Pain Management: Electronic Signature(s) Signed: 09/23/2020 3:13:24 PM By: Jeanine Luz Signed: 09/23/2020 5:33:58 PM By: Carlene Coria RN Entered By: Jeanine Luz on 09/23/2020 08:51:24 Scharrer, [name] ([id number]) -------------------------------------------------------------------------------- Patient/Caregiver Education Details Patient Name: [name], [name] Date of Service: 09/23/2020 8:30 AM Medical Record Number: [id number] Patient Account Number: [id number] Date of Birth/Gender: 1960-10-15 (60 y.o. M) Treating RN: Carlene Coria Primary Care Physician: Jill Alexanders  Other Clinician: Referring Physician: Jill Alexanders Treating Physician/Extender: Tito Dine in Treatment: 6 Education Assessment Education Provided To: Patient Education Topics Provided Tyler/Skin Impairment: Methods: Explain/Verbal Responses: State content correctly Electronic Signature(s) Signed: 09/23/2020 5:33:58 PM By: Carlene Coria RN Entered By: Carlene Coria on 09/23/2020 09:08:50 Steuart, [name] ([id number]) -------------------------------------------------------------------------------- Tyler Assessment Details Patient Name: [name], [name] Date of Service: 09/23/2020 8:30 AM Medical Record Number: [id number] Patient Account Number: [id number] Date of Birth/Sex: 01-18-1961 (60 y.o. M) Treating RN: Carlene Coria Primary Care Elenna Spratling: Jill Alexanders Other Clinician: Referring Antron Seth: Jill Alexanders Treating Kiandre Spagnolo/Extender: Tito Dine in Treatment: 6 Tyler Status Tyler Number: 1 Primary Venous Leg Ulcer Etiology: Tyler Location: Li, [name] Lower Leg Tyler Open Wounding Event: Trauma Status: Date Acquired: 03/09/2020 Comorbid Glaucoma, Coronary Artery Disease, Hypertension, Weeks Of Treatment: 6 History: Neuropathy, Received Chemotherapy Clustered Tyler: Yes Photos Tyler Measurements Length: (cm) 1 Width: (cm) 2 Depth: (cm) 0.1 Area: (cm) 1.571 Volume: (cm) 0.157 % Reduction in Area: 74.2% % Reduction in Volume: 74.2% Epithelialization: Small (1-33%) Tunneling: No Undermining: No Tyler Description Classification: Full Thickness Without Exposed Support Structures Tyler Margin: Flat and Intact Exudate Amount: Small Exudate Type: Serous Exudate Color: amber Foul Odor After Cleansing: No Slough/Fibrino  No Tyler Bed Granulation Amount: Large (67-100%) Exposed Structure Granulation Quality: Red, Pink Fascia Exposed: No Necrotic Amount: None Present (0%) Fat Layer (Subcutaneous Tissue) Exposed: Yes Tendon Exposed: No Muscle  Exposed: No Joint Exposed: No Bone Exposed: No Treatment Notes Tyler #1 (Lower Leg) [name], [name] Cleanser Normal Saline Discharge Instruction: Wash your hands with soap and water. Remove old dressing, discard into plastic bag and place into trash. Cleanse the Tyler with Normal Saline prior to applying a clean dressing using gauze sponges, not tissues or cotton balls. Do not scrub or use excessive force. Pat dry using gauze sponges, not tissue or cotton balls. Lamoureaux, Berry S. ([id number]) Peri-Tyler Care Desitin Maximum Strength Ointment 4 (oz) Topical Primary Dressing Curad Oil Emulsion Dressing 3x3 (in/in) Discharge Instruction: apply to Tyler bed Secondary Dressing ABD Pad 5x9 (in/in) Discharge Instruction: Cover with ABD pad Secured With Compression Wrap Profore Lite LF 3 Multilayer Compression Bandaging System Discharge Instruction: Apply 3 multi-layer wrap as prescribed. Compression Stockings Add-Ons Electronic Signature(s) Signed: 09/23/2020 3:13:24 PM By: Jeanine Luz Signed: 09/23/2020 5:33:58 PM By: Carlene Coria RN Entered By: Jeanine Luz on 09/23/2020 09:02:44 Hlavaty, [name] ([id number]) -------------------------------------------------------------------------------- Vitals Details Patient Name: [name], Aymen S. Date of Service: 09/23/2020 8:30 AM Medical Record Number: [id number] Patient Account Number: [id number] Date of Birth/Sex: [birth year] (60 y.o. M) Treating RN: Carlene Coria Primary Care Winna Golla: Jill Alexanders Other Clinician: Referring Derrek Puff: Jill Alexanders Treating Marrietta Thunder/Extender: Tito Dine in Treatment: 6 Vital Signs Time Taken: 08:48 Temperature (F): 97.9 Height (in): 65 Pulse (bpm): 47 Weight (lbs): 155 Respiratory Rate (breaths/min): 18 Body Mass Index (BMI): 25.8 Blood Pressure (mmHg): 109/59 Reference Range: 80 - 120 mg / dl Electronic Signature(s) Signed: 09/23/2020 3:13:24 PM By: Jeanine Luz Entered  By: Jeanine Luz on 09/23/2020 08:51:18

## 2020-09-30 ENCOUNTER — Other Ambulatory Visit: Payer: Self-pay

## 2020-09-30 ENCOUNTER — Encounter: Payer: BC Managed Care – PPO | Attending: Physician Assistant | Admitting: Physician Assistant

## 2020-09-30 DIAGNOSIS — I890 Lymphedema, not elsewhere classified: Secondary | ICD-10-CM | POA: Insufficient documentation

## 2020-09-30 DIAGNOSIS — L97822 Non-pressure chronic ulcer of other part of left lower leg with fat layer exposed: Secondary | ICD-10-CM | POA: Diagnosis not present

## 2020-09-30 DIAGNOSIS — G629 Polyneuropathy, unspecified: Secondary | ICD-10-CM | POA: Insufficient documentation

## 2020-09-30 DIAGNOSIS — D89811 Chronic graft-versus-host disease: Secondary | ICD-10-CM | POA: Insufficient documentation

## 2020-09-30 DIAGNOSIS — I872 Venous insufficiency (chronic) (peripheral): Secondary | ICD-10-CM | POA: Insufficient documentation

## 2020-09-30 DIAGNOSIS — I10 Essential (primary) hypertension: Secondary | ICD-10-CM | POA: Insufficient documentation

## 2020-09-30 DIAGNOSIS — I2510 Atherosclerotic heart disease of native coronary artery without angina pectoris: Secondary | ICD-10-CM | POA: Diagnosis not present

## 2020-09-30 NOTE — Progress Notes (Addendum)
OTHEL, HOOGENDOORN ([id number]) Visit Report for 09/30/2020 Chief Complaint Document Details Patient Name: [name], Tyler Azaria S. Date of Service: 09/30/2020 8:30 AM Medical Record Number: [id number] Patient Account Number: [id number] Date of Birth/Sex: [birth year] (60 y.o. M) Treating RN: Carlene Coria Primary Care Provider: Jill Alexanders Other Clinician: Referring Provider: Jill Alexanders Treating Provider/Extender: Skipper Cliche in Treatment: 7 Information Obtained from: Patient Chief Complaint Left LE Ulcer Electronic Signature(s) Signed: 09/30/2020 9:22:03 AM By: Worthy Keeler PA-C Entered By: Worthy Keeler on 09/30/2020 09:22:03 [name], [name] ([id number]) -------------------------------------------------------------------------------- HPI Details Patient Name: [name], [name] Date of Service: 09/30/2020 8:30 AM Medical Record Number: [id number] Patient Account Number: [id number] Date of Birth/Sex: 17-Dec-1960 (60 y.o. M) Treating RN: Carlene Coria Primary Care Provider: Jill Alexanders Other Clinician: Referring Provider: Jill Alexanders Treating Provider/Extender: Skipper Cliche in Treatment: 7 History of Present Illness HPI Description: 08/09/2020 upon evaluation today patient appears to be doing somewhat poorly in regard to his left lower extremity. Subsequently he on January 14 was seen by the [facility] wound care center. It appears that they actually put him in a 4-layer compression wrap along with [facility] he tells me that felt pretty good but again that is quite a drive for him to get there. He mainly was being seen there is that is where his cancer doctor is. With that being said the patient tells me currently that he is having some discomfort at the site but is not terrible. He has been tolerating the dressing changes without complication which is great news. 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 He does seem  to have potentially some issues with regulating his temperature which is consistent with this process. We saw that today as well with his low vital sign temperature reading. The initial injury actually occurred when the patient dropped asphalt which landed on the anterior portion of his leg causing the injury. The good news is this does not appear to be too deep which hopefully should bode well for rapid healing. 08/16/2020 on evaluation today patient appears to be doing well with regard to his wounds currently. In fact the wound appears to be about half the size of what it was last week and overall I am extremely pleased with where things stand. Fortunately there is no signs of active infection at this time. No fevers, chills, nausea, vomiting, or diarrhea. 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 We have been using Hydrofera Blue under 3 layer compression 4/6; continued improvement using Hydrofera Blue under 3 layer compression 09/09/2020 upon evaluation today patient appears to be doing well with regard to his wound. We have been using Hydrofera Blue which has done well but he seems to have may be stalled this a little bit I think this may be getting stuck to the wound bed is the main issue. Fortunately there does not appear to be any signs of active infection which is great news overall very pleased with where things stand today. 09/16/2020 upon evaluation today patient appears to be doing well with regard to his leg ulcers. In fact there is very little open although he still has a little bit of weeping that I see. There was not a tremendous amount of drainage over the past week. Fortunately I think he is doing quite well. 4/29; left medial calf in the setting of chronic venous insufficiency hemosiderin deposition.  Looking at the pictures from last week this wound is gotten smaller although the area still looks somewhat moist. Started on oil  emulsion under 3 layer compression last week 09/30/2020 upon evaluation today patient appears to be doing excellent in regard to the wound on the left medial lower leg. Fortunately there does not appear to be any signs of active infection at this time which is great news and overall very pleased with where he stands at this point. No fevers, chills, nausea, vomiting, or diarrhea. The patient is also pleased with his progress. Electronic Signature(s) Signed: 09/30/2020 6:14:51 PM By: Worthy Keeler PA-C Entered By: Worthy Keeler on 09/30/2020 18:14:51 [name], [name] ([id number]) -------------------------------------------------------------------------------- Physical Exam Details Patient Name: [name], [name] Date of Service: 09/30/2020 8:30 AM Medical Record Number: [id number] Patient Account Number: [id number] Date of Birth/Sex: 1960-08-19 (60 y.o. M) Treating RN: Carlene Coria Primary Care Provider: Jill Alexanders Other Clinician: Referring Provider: Jill Alexanders Treating Provider/Extender: Skipper Cliche in Treatment: 7 Constitutional Well-nourished and well-hydrated in no acute distress. Respiratory normal breathing without difficulty. Psychiatric this patient is able to make decisions and demonstrates good insight into disease process. Alert and Oriented x 3. pleasant and cooperative. Notes Upon inspection patient's wound bed showed signs of good granulation epithelization at this point. There does not appear to be any evidence of infection which is great news and overall very pleased with where things stand today. No fevers, chills, nausea, vomiting, or diarrhea. Electronic Signature(s) Signed: 09/30/2020 6:15:05 PM By: Worthy Keeler PA-C Entered By: Worthy Keeler on 09/30/2020 18:15:05 [name], [name] ([id number]) -------------------------------------------------------------------------------- Physician Orders Details Patient Name: [name], [name] Date of Service: 09/30/2020 8:30  AM Medical Record Number: [id number] Patient Account Number: [id number] Date of Birth/Sex: [birth year] (61 y.o. M) Treating RN: Carlene Coria Primary Care Provider: Jill Alexanders Other Clinician: Referring Provider: Jill Alexanders Treating Provider/Extender: Skipper Cliche in Treatment: 7 Verbal / Phone Orders: No Diagnosis Coding ICD-10 Coding Code Description I87.2 Venous insufficiency (chronic) (peripheral) L97.822 Non-pressure chronic ulcer of other part of left lower leg with fat layer exposed I10 Essential (primary) hypertension I25.10 Atherosclerotic heart disease of native coronary artery without angina pectoris R68.0 Hypothermia, not associated with low environmental temperature Follow-up Appointments o Return Appointment in 1 week. Bathing/ Shower/ Hygiene o May shower with wound dressing protected with water repellent cover or cast protector. o No tub bath. Edema Control - Lymphedema / Segmental Compressive Device / Other Left Lower Extremity o Optional: One layer of unna paste to top of compression wrap (to act as an anchor). o Elevate, Exercise Daily and Avoid Standing for Long Periods of Time. o Elevate legs to the level of the heart and pump ankles as often as possible o Elevate leg(s) parallel to the floor when sitting. Wound Treatment Wound #1 - Lower Leg Wound Laterality: Left, Medial Cleanser: Normal Saline 1 x Per Week/30 Days Discharge Instructions: Wash your hands with soap and water. Remove old dressing, discard into plastic bag and place into trash. Cleanse the wound with Normal Saline prior to applying a clean dressing using gauze sponges, not tissues or cotton balls. Do not scrub or use excessive force. Pat dry using gauze sponges, not tissue or cotton balls. Peri-Wound Care: Desitin Maximum Strength Ointment 4 (oz) (Generic) 1 x Per Week/30 Days Primary Dressing: Curad Oil Emulsion Dressing 3x3 (in/in) 1 x Per Week/30 Days Discharge  Instructions: apply to wound bed Secondary Dressing: ABD Pad 5x9 (in/in) 1 x Per Week/30  Days Discharge Instructions: Cover with ABD pad Compression Wrap: Profore Lite LF 3 Multilayer Compression Bandaging System 1 x Per Week/30 Days Discharge Instructions: Apply 3 multi-layer wrap as prescribed. Electronic Signature(s) Signed: 09/30/2020 6:22:42 PM By: Worthy Keeler PA-C Signed: 10/10/2020 8:33:59 AM By: Carlene Coria RN Entered By: Carlene Coria on 09/30/2020 09:26:37 [name], [name] ([id number]) -------------------------------------------------------------------------------- Problem List Details Patient Name: MONACHINO, Tyon S. Date of Service: 09/30/2020 8:30 AM Medical Record Number: [id number] Patient Account Number: [id number] Date of Birth/Sex: 08/18/1960 (60 y.o. M) Treating RN: Carlene Coria Primary Care Provider: Jill Alexanders Other Clinician: Referring Provider: Jill Alexanders Treating Provider/Extender: Skipper Cliche in Treatment: 7 Active Problems ICD-10 Encounter Code Description Active Date MDM Diagnosis I87.2 Venous insufficiency (chronic) (peripheral) 08/09/2020 No Yes L97.822 Non-pressure chronic ulcer of other part of left lower leg with fat layer 08/09/2020 No Yes exposed [place] (primary) hypertension 08/09/2020 No Yes I25.10 Atherosclerotic heart disease of native coronary artery without angina 08/09/2020 No Yes pectoris R68.0 Hypothermia, not associated with low environmental temperature 08/09/2020 No Yes Inactive Problems Resolved Problems Electronic Signature(s) Signed: 09/30/2020 9:21:55 AM By: Worthy Keeler PA-C Entered By: Worthy Keeler on 09/30/2020 09:21:55 [name], [name] ([id number]) -------------------------------------------------------------------------------- Progress Note Details Patient Name: [name], [name] Date of Service: 09/30/2020 8:30 AM Medical Record Number: [id number] Patient Account Number: [id number] Date of Birth/Sex: [birth year] (60 y.o.  M) Treating RN: Carlene Coria Primary Care Provider: Jill Alexanders Other Clinician: Referring Provider: Jill Alexanders Treating Provider/Extender: Skipper Cliche in Treatment: 7 Subjective Chief Complaint Information obtained from Patient Left LE Ulcer History of Present Illness (HPI) 08/09/2020 upon evaluation today patient appears to be doing somewhat poorly in regard to his left lower extremity. Subsequently he on January 14 was seen by the [facility] wound care center. It appears that they actually put him in a 4-layer compression wrap along with [facility] he tells me that felt pretty good but again that is quite a drive for him to get there. He mainly was being seen there is that is where his cancer doctor is. With that being said the patient tells me currently that he is having some discomfort at the site but is not terrible. He has been tolerating the dressing changes without complication which is great news. 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 He does seem to have potentially some issues with regulating his temperature which is consistent with this process. We saw that today as well with his low vital sign temperature reading. The initial injury actually occurred when the patient dropped asphalt which landed on the anterior portion of his leg causing the injury. The good news is this does not appear to be too deep which hopefully should bode well for rapid healing. 08/16/2020 on evaluation today patient appears to be doing well with regard to his wounds currently. In fact the wound appears to be about half the size of what it was last week and overall I am extremely pleased with where things stand. Fortunately there is no signs of active infection at this time. No fevers, chills, nausea, vomiting, or diarrhea. 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  We have been using Hydrofera Blue under 3 layer compression 4/6; continued improvement using Hydrofera Blue under 3 layer compression 09/09/2020 upon evaluation today patient appears to be doing well with regard to his wound. We have been using  Hydrofera Blue which has done well but he seems to have may be stalled this a little bit I think this may be getting stuck to the wound bed is the main issue. Fortunately there does not appear to be any signs of active infection which is great news overall very pleased with where things stand today. 09/16/2020 upon evaluation today patient appears to be doing well with regard to his leg ulcers. In fact there is very little open although he still has a little bit of weeping that I see. There was not a tremendous amount of drainage over the past week. Fortunately I think he is doing quite well. 4/29; left medial calf in the setting of chronic venous insufficiency hemosiderin deposition. Looking at the pictures from last week this wound is gotten smaller although the area still looks somewhat moist. Started on oil emulsion under 3 layer compression last week 09/30/2020 upon evaluation today patient appears to be doing excellent in regard to the wound on the left medial lower leg. Fortunately there does not appear to be any signs of active infection at this time which is great news and overall very pleased with where he stands at this point. No fevers, chills, nausea, vomiting, or diarrhea. The patient is also pleased with his progress. Objective Constitutional Well-nourished and well-hydrated in no acute distress. Vitals Time Taken: 8:58 AM, Height: 65 in, Weight: 155 lbs, BMI: 25.8, Temperature: 97.9 F, Pulse: 54 bpm, Respiratory Rate: 18 breaths/min, Blood Pressure: 107/66 mmHg. Respiratory normal breathing without difficulty. Psychiatric this patient is able to make decisions and demonstrates good insight into disease process. Alert and Oriented x 3.  pleasant and cooperative. General Notes: Upon inspection patient's wound bed showed signs of good granulation epithelization at this point. There does not appear to be any evidence of infection which is great news and overall very pleased with where things stand today. No fevers, chills, nausea, vomiting, or diarrhea. [name], [name] ([id number]) Integumentary (Hair, Skin) Wound #1 status is Open. Original cause of wound was Trauma. The date acquired was: 03/09/2020. The wound has been in treatment 7 weeks. The wound is located on the Left,Medial Lower Leg. The wound measures 0.7cm length x 0.6cm width x 0.1cm depth; 0.33cm^2 area and 0.033cm^3 volume. There is Fat Layer (Subcutaneous Tissue) exposed. There is no tunneling or undermining noted. There is a medium amount of serous drainage noted. The wound margin is flat and intact. There is large (67-100%) red granulation within the wound bed. There is no necrotic tissue within the wound bed. Assessment Active Problems ICD-10 Venous insufficiency (chronic) (peripheral) Non-pressure chronic ulcer of other part of left lower leg with fat layer exposed Essential (primary) hypertension Atherosclerotic heart disease of native coronary artery without angina pectoris Hypothermia, not associated with low environmental temperature Procedures Wound #1 Pre-procedure diagnosis of Wound #1 is a Venous Leg Ulcer located on the Left,Medial Lower Leg . There was a Three Layer Compression Therapy Procedure by Carlene Coria, RN. Post procedure Diagnosis Wound #1: Same as Pre-Procedure Plan Follow-up Appointments: Return Appointment in 1 week. Bathing/ Shower/ Hygiene: May shower with wound dressing protected with water repellent cover or cast protector. No tub bath. Edema Control - Lymphedema / Segmental Compressive Device / Other: Optional: One layer of unna paste to top of compression wrap (to act as an anchor). Elevate, Exercise Daily and Avoid Standing  for Long Periods of Time. Elevate legs to the level of the heart and pump ankles as often as possible Elevate leg(s)  parallel to the floor when sitting. WOUND #1: - Lower Leg Wound Laterality: Left, Medial Cleanser: Normal Saline 1 x Per Week/30 Days Discharge Instructions: Wash your hands with soap and water. Remove old dressing, discard into plastic bag and place into trash. Cleanse the wound with Normal Saline prior to applying a clean dressing using gauze sponges, not tissues or cotton balls. Do not scrub or use excessive force. Pat dry using gauze sponges, not tissue or cotton balls. Peri-Wound Care: Desitin Maximum Strength Ointment 4 (oz) (Generic) 1 x Per Week/30 Days Primary Dressing: Curad Oil Emulsion Dressing 3x3 (in/in) 1 x Per Week/30 Days Discharge Instructions: apply to wound bed Secondary Dressing: ABD Pad 5x9 (in/in) 1 x Per Week/30 Days Discharge Instructions: Cover with ABD pad Compression Wrap: Profore Lite LF 3 Multilayer Compression Bandaging System 1 x Per Week/30 Days Discharge Instructions: Apply 3 multi-layer wrap as prescribed. 1. Would recommend that we continue with the wound care measures as before and the patient is in agreement with that plan this includes the use of the ABD pad as well as an oil emulsion dressing which seems to be doing well. I am also can recommend that we continue with a 3 layer compression wrap which I think is doing a great job. 2. The patient will also continue to elevate his legs much as possible hopefully he will be completely healed by next week. We will see patient back for reevaluation in 1 week here in the clinic. If anything worsens or changes patient will contact our office for additional recommendations. [name], [name] Kitchen ([id number]) Electronic Signature(s) Signed: 09/30/2020 6:15:56 PM By: Worthy Keeler PA-C Entered By: Worthy Keeler on 09/30/2020 18:15:55 [name], Karell S.  ([id number]) -------------------------------------------------------------------------------- SuperBill Details Patient Name: [name], Rohin S. Date of Service: 09/30/2020 Medical Record Number: [id number] Patient Account Number: [id number] Date of Birth/Sex: Apr 30, 1961 (60 y.o. M) Treating RN: Carlene Coria Primary Care Provider: Jill Alexanders Other Clinician: Referring Provider: Jill Alexanders Treating Provider/Extender: Skipper Cliche in Treatment: 7 Diagnosis Coding ICD-10 Codes Code Description I87.2 Venous insufficiency (chronic) (peripheral) L97.822 Non-pressure chronic ulcer of other part of left lower leg with fat layer exposed [place] (primary) hypertension I25.10 Atherosclerotic heart disease of native coronary artery without angina pectoris R68.0 Hypothermia, not associated with low environmental temperature Facility Procedures CPT4 Code: [id number] Description: (Facility Use Only) [phone] - Central LWR LT LEG Modifier: Quantity: 1 Physician Procedures CPT4 [id number] Description: 99213 - WC PHYS LEVEL 3 - EST PT Modifier: Quantity: 1 CPT4 Code: Description: ICD-10 Diagnosis Description I87.2 Venous insufficiency (chronic) (peripheral) L97.822 Non-pressure chronic ulcer of other part of left lower leg with fat lay I10 Essential (primary) hypertension I25.10 Atherosclerotic heart disease of  native coronary artery without angina Modifier: er exposed pectoris Quantity: Electronic Signature(s) Signed: 09/30/2020 6:16:19 PM By: Worthy Keeler PA-C Entered By: Worthy Keeler on 09/30/2020 18:16:19

## 2020-10-02 DIAGNOSIS — D89811 Chronic graft-versus-host disease: Secondary | ICD-10-CM | POA: Diagnosis not present

## 2020-10-02 DIAGNOSIS — M79604 Pain in right leg: Secondary | ICD-10-CM | POA: Diagnosis not present

## 2020-10-02 DIAGNOSIS — R748 Abnormal levels of other serum enzymes: Secondary | ICD-10-CM | POA: Diagnosis not present

## 2020-10-02 DIAGNOSIS — R011 Cardiac murmur, unspecified: Secondary | ICD-10-CM | POA: Diagnosis not present

## 2020-10-02 DIAGNOSIS — R7402 Elevation of levels of lactic acid dehydrogenase (LDH): Secondary | ICD-10-CM | POA: Diagnosis not present

## 2020-10-02 DIAGNOSIS — I341 Nonrheumatic mitral (valve) prolapse: Secondary | ICD-10-CM | POA: Diagnosis not present

## 2020-10-02 DIAGNOSIS — I340 Nonrheumatic mitral (valve) insufficiency: Secondary | ICD-10-CM | POA: Diagnosis not present

## 2020-10-02 DIAGNOSIS — E785 Hyperlipidemia, unspecified: Secondary | ICD-10-CM | POA: Diagnosis not present

## 2020-10-02 DIAGNOSIS — G620 Drug-induced polyneuropathy: Secondary | ICD-10-CM | POA: Diagnosis not present

## 2020-10-02 DIAGNOSIS — C801 Malignant (primary) neoplasm, unspecified: Secondary | ICD-10-CM | POA: Diagnosis not present

## 2020-10-02 DIAGNOSIS — R252 Cramp and spasm: Secondary | ICD-10-CM | POA: Diagnosis not present

## 2020-10-02 DIAGNOSIS — Z951 Presence of aortocoronary bypass graft: Secondary | ICD-10-CM | POA: Diagnosis not present

## 2020-10-02 DIAGNOSIS — I351 Nonrheumatic aortic (valve) insufficiency: Secondary | ICD-10-CM | POA: Diagnosis not present

## 2020-10-02 DIAGNOSIS — K219 Gastro-esophageal reflux disease without esophagitis: Secondary | ICD-10-CM | POA: Diagnosis not present

## 2020-10-02 DIAGNOSIS — L97921 Non-pressure chronic ulcer of unspecified part of left lower leg limited to breakdown of skin: Secondary | ICD-10-CM | POA: Diagnosis not present

## 2020-10-02 DIAGNOSIS — C9000 Multiple myeloma not having achieved remission: Secondary | ICD-10-CM | POA: Diagnosis not present

## 2020-10-02 DIAGNOSIS — M79605 Pain in left leg: Secondary | ICD-10-CM | POA: Diagnosis not present

## 2020-10-02 DIAGNOSIS — I2510 Atherosclerotic heart disease of native coronary artery without angina pectoris: Secondary | ICD-10-CM | POA: Diagnosis not present

## 2020-10-03 DIAGNOSIS — C801 Malignant (primary) neoplasm, unspecified: Secondary | ICD-10-CM | POA: Diagnosis not present

## 2020-10-04 DIAGNOSIS — C801 Malignant (primary) neoplasm, unspecified: Secondary | ICD-10-CM | POA: Diagnosis not present

## 2020-10-07 ENCOUNTER — Encounter: Payer: BC Managed Care – PPO | Admitting: Physician Assistant

## 2020-10-07 ENCOUNTER — Other Ambulatory Visit: Payer: Self-pay

## 2020-10-07 DIAGNOSIS — I10 Essential (primary) hypertension: Secondary | ICD-10-CM | POA: Diagnosis not present

## 2020-10-07 DIAGNOSIS — D89811 Chronic graft-versus-host disease: Secondary | ICD-10-CM | POA: Diagnosis not present

## 2020-10-07 DIAGNOSIS — I890 Lymphedema, not elsewhere classified: Secondary | ICD-10-CM | POA: Diagnosis not present

## 2020-10-07 DIAGNOSIS — I2510 Atherosclerotic heart disease of native coronary artery without angina pectoris: Secondary | ICD-10-CM | POA: Diagnosis not present

## 2020-10-07 DIAGNOSIS — G629 Polyneuropathy, unspecified: Secondary | ICD-10-CM | POA: Diagnosis not present

## 2020-10-07 DIAGNOSIS — L97822 Non-pressure chronic ulcer of other part of left lower leg with fat layer exposed: Secondary | ICD-10-CM | POA: Diagnosis not present

## 2020-10-07 DIAGNOSIS — I872 Venous insufficiency (chronic) (peripheral): Secondary | ICD-10-CM | POA: Diagnosis not present

## 2020-10-07 NOTE — Progress Notes (Addendum)
[name], [name] ([id number]) Visit Report for 10/07/2020 Chief Complaint Document Details Patient Name: [name], Tyler Glendel S. Date of Service: 10/07/2020 8:15 AM Medical Record Number: [id number] Patient Account Number: [id number] Date of Birth/Sex: 03-Feb-1961 (60 y.o. M) Treating RN: Dolan Amen Primary Care Provider: Jill Alexanders Other Clinician: Referring Provider: Jill Alexanders Treating Provider/Extender: Skipper Cliche in Treatment: 8 Information Obtained from: Patient Chief Complaint Left LE Ulcer Electronic Signature(s) Signed: 10/07/2020 8:46:00 AM By: Worthy Keeler PA-C Entered By: Worthy Keeler on 10/07/2020 08:45:59 Li, [name] ([id number]) -------------------------------------------------------------------------------- HPI Details Patient Name: [name], [name] Date of Service: 10/07/2020 8:15 AM Medical Record Number: [id number] Patient Account Number: [id number] Date of Birth/Sex: September 17, 1960 (60 y.o. M) Treating RN: Dolan Amen Primary Care Provider: Jill Alexanders Other Clinician: Referring Provider: Jill Alexanders Treating Provider/Extender: Skipper Cliche in Treatment: 8 History of Present Illness HPI Description: 08/09/2020 upon evaluation today patient appears to be doing somewhat poorly in regard to his left lower extremity. Subsequently he on January 14 was seen by the [facility] wound care center. It appears that they actually put him in a 4-layer compression wrap along with [facility] he tells me that felt pretty good but again that is quite a drive for him to get there. He mainly was being seen there is that is where his cancer doctor is. With that being said the patient tells me currently that he is having some discomfort at the site but is not terrible. He has been tolerating the dressing changes without complication which is great news. 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 He  does seem to have potentially some issues with regulating his temperature which is consistent with this process. We saw that today as well with his low vital sign temperature reading. The initial injury actually occurred when the patient dropped asphalt which landed on the anterior portion of his leg causing the injury. The good news is this does not appear to be too deep which hopefully should bode well for rapid healing. 08/16/2020 on evaluation today patient appears to be doing well with regard to his wounds currently. In fact the wound appears to be about half the size of what it was last week and overall I am extremely pleased with where things stand. Fortunately there is no signs of active infection at this time. No fevers, chills, nausea, vomiting, or diarrhea. 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 We have been using Hydrofera Blue under 3 layer compression 4/6; continued improvement using Hydrofera Blue under 3 layer compression 09/09/2020 upon evaluation today patient appears to be doing well with regard to his wound. We have been using Hydrofera Blue which has done well but he seems to have may be stalled this a little bit I think this may be getting stuck to the wound bed is the main issue. Fortunately there does not appear to be any signs of active infection which is great news overall very pleased with where things stand today. 09/16/2020 upon evaluation today patient appears to be doing well with regard to his leg ulcers. In fact there is very little open although he still has a little bit of weeping that I see. There was not a tremendous amount of drainage over the past week. Fortunately I think he is doing quite well. 4/29; left medial calf in the setting of chronic venous insufficiency hemosiderin deposition.  Looking at the pictures from last week this wound is gotten smaller although the area still looks somewhat moist. Started  on oil emulsion under 3 layer compression last week 09/30/2020 upon evaluation today patient appears to be doing excellent in regard to the wound on the left medial lower leg. Fortunately there does not appear to be any signs of active infection at this time which is great news and overall very pleased with where he stands at this point. No fevers, chills, nausea, vomiting, or diarrhea. The patient is also pleased with his progress. 10/07/2020 upon evaluation today patient appears to be doing excellent in regard to his wound. He has been tolerating the dressing changes this appears to be completely healed based on what I am seeing today. Electronic Signature(s) Signed: 10/07/2020 9:14:19 AM By: Lenda Kelp PA-C Previous Signature: 10/07/2020 8:50:17 AM Version By: Lenda Kelp PA-C Previous Signature: 10/07/2020 8:46:09 AM Version By: Lenda Kelp PA-C Entered By: Lenda Kelp on 10/07/2020 09:14:19 Li, [name] ([id number]) -------------------------------------------------------------------------------- Physical Exam Details Patient Name: [name], [name] Date of Service: 10/07/2020 8:15 AM Medical Record Number: [id number] Patient Account Number: [id number] Date of Birth/Sex: May 21, 1961 (60 y.o. M) Treating RN: Rogers Blocker Primary Care Provider: Sharlot Gowda Other Clinician: Referring Provider: Sharlot Gowda Treating Provider/Extender: Rowan Blase in Treatment: 8 Constitutional Well-nourished and well-hydrated in no acute distress. Respiratory normal breathing without difficulty. Psychiatric this patient is able to make decisions and demonstrates good insight into disease process. Alert and Oriented x 3. pleasant and cooperative. Notes Upon inspection patient's wound bed actually showed signs of good granulation epithelization at this point. Fortunately there does not appear to be any signs of anything open which is excellent. Electronic Signature(s) Signed: 10/07/2020  9:56:53 AM By: Lenda Kelp PA-C Previous Signature: 10/07/2020 8:51:31 AM Version By: Lenda Kelp PA-C Previous Signature: 10/07/2020 8:49:30 AM Version By: Lenda Kelp PA-C Entered By: Lenda Kelp on 10/07/2020 09:56:52 Whitelaw, Webber S. ([id number]) -------------------------------------------------------------------------------- Physician Orders Details Patient Name: [name], [name] Date of Service: 10/07/2020 8:15 AM Medical Record Number: [id number] Patient Account Number: [id number] Date of Birth/Sex: 10-03-1960 (60 y.o. M) Treating RN: Huel Coventry Primary Care Provider: Sharlot Gowda Other Clinician: Referring Provider: Sharlot Gowda Treating Provider/Extender: Rowan Blase in Treatment: 8 Verbal / Phone Orders: No Diagnosis Coding ICD-10 Coding Code Description I87.2 Venous insufficiency (chronic) (peripheral) L97.822 Non-pressure chronic ulcer of other part of left lower leg with fat layer exposed I10 Essential (primary) hypertension I25.10 Atherosclerotic heart disease of native coronary artery without angina pectoris R68.0 Hypothermia, not associated with low environmental temperature Discharge From [facility] Services o Discharge from Wound Care Center Treatment Complete Follow-up Appointments o Other: - if needed Medications-Please add to medication list. o Other: - Steroid cream daily for the next few weeks. Then as needed. Patient Medications Allergies: atorvastatin, chlorhexidine gluconate Notifications Medication Indication Start End triamcinolone acetonide 10/07/2020 DOSE topical 0.1 % ointment - ointment topical applied in a thin film to the discolored region of the leg 2 times per day for 2 weeks then as needed following. Electronic Signature(s) Signed: 10/07/2020 9:58:39 AM By: Lenda Kelp PA-C Entered By: Lenda Kelp on 10/07/2020 09:58:39 Li, [name]  ([id number]) -------------------------------------------------------------------------------- Problem List Details Patient Name: [name], [name] Date of Service: 10/07/2020 8:15 AM Medical Record Number: [id number] Patient Account Number: [id number] Date of Birth/Sex: 1961-05-01 (60 y.o. M) Treating RN: Rogers Blocker Primary Care Provider: Sharlot Gowda Other Clinician: Referring Provider: Susann Givens,  John Treating Provider/Extender: Allen Derry Weeks in Treatment: 8 Active Problems ICD-10 Encounter Code Description Active Date MDM Diagnosis I87.2 Venous insufficiency (chronic) (peripheral) 08/09/2020 No Yes L97.822 Non-pressure chronic ulcer of other part of left lower leg with fat layer 08/09/2020 No Yes exposed I10 Essential (primary) hypertension 08/09/2020 No Yes I25.10 Atherosclerotic heart disease of native coronary artery without angina 08/09/2020 No Yes pectoris R68.0 Hypothermia, not associated with low environmental temperature 08/09/2020 No Yes Inactive Problems Resolved Problems Electronic Signature(s) Signed: 10/07/2020 8:45:52 AM By: Lenda Kelp PA-C Entered By: Lenda Kelp on 10/07/2020 08:45:52 Li, [name] ([id number]) -------------------------------------------------------------------------------- Progress Note Details Patient Name: [name], Jahari S. Date of Service: 10/07/2020 8:15 AM Medical Record Number: [id number] Patient Account Number: [id number] Date of Birth/Sex: 01-25-1961 (60 y.o. M) Treating RN: Rogers Blocker Primary Care Provider: Sharlot Gowda Other Clinician: Referring Provider: Sharlot Gowda Treating Provider/Extender: Rowan Blase in Treatment: 8 Subjective Chief Complaint Information obtained from Patient Left LE Ulcer History of Present Illness (HPI) 08/09/2020 upon evaluation today patient appears to be doing somewhat poorly in regard to his left lower extremity. Subsequently he on January 14 was seen by the [facility] wound care center. It  appears that they actually put him in a 4-layer compression wrap along with [facility] he tells me that felt pretty good but again that is quite a drive for him to get there. He mainly was being seen there is that is where his cancer doctor is. With that being said the patient tells me currently that he is having some discomfort at the site but is not terrible. He has been tolerating the dressing changes without complication which is great news. 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 He does seem to have potentially some issues with regulating his temperature which is consistent with this process. We saw that today as well with his low vital sign temperature reading. The initial injury actually occurred when the patient dropped asphalt which landed on the anterior portion of his leg causing the injury. The good news is this does not appear to be too deep which hopefully should bode well for rapid healing. 08/16/2020 on evaluation today patient appears to be doing well with regard to his wounds currently. In fact the wound appears to be about half the size of what it was last week and overall I am extremely pleased with where things stand. Fortunately there is no signs of active infection at this time. No fevers, chills, nausea, vomiting, or diarrhea. 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 We have been using Hydrofera Blue under 3 layer compression 4/6; continued improvement using Hydrofera Blue under 3 layer compression 09/09/2020 upon evaluation today patient appears to be doing well with regard to his wound. We have been using Hydrofera Blue which has done well but he seems to have may be stalled this a little bit I think this may be getting stuck to the wound bed is the main issue. Fortunately there does not appear to be any signs of active infection which is  great news overall very pleased with where things stand today. 09/16/2020 upon evaluation today patient appears to be doing well with regard to his leg ulcers. In fact there is very little open although he still has a little bit of weeping that I see. There was not a tremendous amount of drainage over the past week.  Fortunately I think he is doing quite well. 4/29; left medial calf in the setting of chronic venous insufficiency hemosiderin deposition. Looking at the pictures from last week this wound is gotten smaller although the area still looks somewhat moist. Started on oil emulsion under 3 layer compression last week 09/30/2020 upon evaluation today patient appears to be doing excellent in regard to the wound on the left medial lower leg. Fortunately there does not appear to be any signs of active infection at this time which is great news and overall very pleased with where he stands at this point. No fevers, chills, nausea, vomiting, or diarrhea. The patient is also pleased with his progress. 10/07/2020 upon evaluation today patient appears to be doing excellent in regard to his wound. He has been tolerating the dressing changes this appears to be completely healed based on what I am seeing today. Objective Constitutional Well-nourished and well-hydrated in no acute distress. Vitals Time Taken: 8:25 AM, Height: 65 in, Weight: 155 lbs, BMI: 25.8, Temperature: 98.0 F, Pulse: 54 bpm, Respiratory Rate: 18 breaths/min, Blood Pressure: 107/67 mmHg. Respiratory normal breathing without difficulty. Psychiatric this patient is able to make decisions and demonstrates good insight into disease process. Alert and Oriented x 3. pleasant and cooperative. Li, [name] ([id number]) General Notes: Upon inspection patient's wound bed actually showed signs of good granulation epithelization at this point. Fortunately there does not appear to be any signs of anything open which is excellent. Integumentary  (Hair, Skin) Wound #1 status is Healed - Epithelialized. Original cause of wound was Trauma. The date acquired was: 03/09/2020. The wound has been in treatment 8 weeks. The wound is located on the Left,Medial Lower Leg. The wound measures 0cm length x 0cm width x 0cm depth; 0cm^2 area and 0cm^3 volume. There is no tunneling or undermining noted. There is a none present amount of drainage noted. The wound margin is flat and intact. There is large (67-100%) red granulation within the wound bed. There is no necrotic tissue within the wound bed. Assessment Active Problems ICD-10 Venous insufficiency (chronic) (peripheral) Non-pressure chronic ulcer of other part of left lower leg with fat layer exposed Essential (primary) hypertension Atherosclerotic heart disease of native coronary artery without angina pectoris Hypothermia, not associated with low environmental temperature Plan Discharge From [facility] Services: Discharge from [facility] Treatment Complete Follow-up Appointments: Other: - if needed Medications-Please add to medication list.: Other: - Steroid cream daily for the next few weeks. Then as needed. The following medication(s) was prescribed: triamcinolone acetonide topical 0.1 % ointment ointment topical applied in a thin film to the discolored region of the leg 2 times per day for 2 weeks then as needed following. starting 10/07/2020 1. I would recommend that we going continue with the wound care measures as before and the patient is in agreement with the plan this includes the use of the triamcinolone to the area of where the wound was. I think that he should use this twice a day he does not actually have to keep it covered though if he feels like he needs to he can definitely put an ABD pad or gauze over top of this I am okay with that. 2. I am going to recommend that he continue to monitor for any signs of anything draining or worsening if anything occurs that he needs to let  us know about and come in to be checked out again for he will definitely let me know. We will see him back for  follow-up visit as needed. Electronic Signature(s) Signed: 10/07/2020 9:58:47 AM By: Worthy Keeler PA-C Entered By: Worthy Keeler on 10/07/2020 09:58:47

## 2020-10-07 NOTE — Progress Notes (Signed)
ANSEL, FERRALL ([id number]) Visit Report for 10/07/2020 Arrival Information Details Patient Name: KNOWLEDGE, ESCANDON. Date of Service: 10/07/2020 8:15 AM Medical Record Number: [id number] Patient Account Number: [id number] Date of Birth/Sex: Oct 08, 1960 (60 y.o. M) Treating RN: Dolan Amen Primary Care Chrisean Kloth: Jill Alexanders Other Clinician: Referring Dianna Ewald: Jill Alexanders Treating Hadiyah Maricle/Extender: Skipper Cliche in Treatment: 8 Visit Information History Since Last Visit Added or deleted any medications: No Patient Arrived: Ambulatory Had a fall or experienced change in No Arrival Time: 08:27 activities of daily living that may affect Accompanied By: self risk of falls: Transfer Assistance: None Hospitalized since last visit: Yes Patient Identification Verified: Yes Pain Present Now: No Secondary Verification Process Completed: Yes Patient Requires Transmission-Based No Precautions: Patient Has Alerts: Yes Patient Alerts: Patient on Blood Thinner NOT diabetic Electronic Signature(s) Signed: 10/07/2020 11:20:32 AM By: Jeanine Luz Entered By: Jeanine Luz on 10/07/2020 08:32:24 Senkbeil, Gradie S. ([id number]) -------------------------------------------------------------------------------- Clinic Level of Care Assessment Details Patient Name: [name], Uthman S. Date of Service: 10/07/2020 8:15 AM Medical Record Number: [id number] Patient Account Number: [id number] Date of Birth/Sex: 1961/02/22 (60 y.o. M) Treating RN: Cornell Barman Primary Care Michael Walrath: Jill Alexanders Other Clinician: Referring Morad Tal: Jill Alexanders Treating Norma Montemurro/Extender: Skipper Cliche in Treatment: 8 Clinic Level of Care Assessment Items TOOL 4 Quantity Score []  - Use when only an EandM is performed on FOLLOW-UP visit 0 ASSESSMENTS - Nursing Assessment / Reassessment X - Reassessment of Co-morbidities (includes updates in patient status) 1 10 X- 1 5 Reassessment of Adherence to Treatment  Plan ASSESSMENTS - Wound and Skin Assessment / Reassessment X - Simple Wound Assessment / Reassessment - one wound 1 5 []  - 0 Complex Wound Assessment / Reassessment - multiple wounds []  - 0 Dermatologic / Skin Assessment (not related to wound area) ASSESSMENTS - Focused Assessment []  - Circumferential Edema Measurements - multi extremities 0 []  - 0 Nutritional Assessment / Counseling / Intervention []  - 0 Lower Extremity Assessment (monofilament, tuning fork, pulses) []  - 0 Peripheral Arterial Disease Assessment (using hand held doppler) ASSESSMENTS - Ostomy and/or Continence Assessment and Care []  - Incontinence Assessment and Management 0 []  - 0 Ostomy Care Assessment and Management (repouching, etc.) PROCESS - Coordination of Care X - Simple Patient / Family Education for ongoing care 1 15 []  - 0 Complex (extensive) Patient / Family Education for ongoing care []  - 0 Staff obtains Programmer, systems, Records, Test Results / Process Orders []  - 0 Staff telephones HHA, Nursing Homes / Clarify orders / etc []  - 0 Routine Transfer to another Facility (non-emergent condition) []  - 0 Routine Hospital Admission (non-emergent condition) []  - 0 New Admissions / Biomedical engineer / Ordering NPWT, Apligraf, etc. []  - 0 Emergency Hospital Admission (emergent condition) X- 1 10 Simple Discharge Coordination []  - 0 Complex (extensive) Discharge Coordination PROCESS - Special Needs []  - Pediatric / Minor Patient Management 0 []  - 0 Isolation Patient Management []  - 0 Hearing / Language / Visual special needs []  - 0 Assessment of Community assistance (transportation, D/C planning, etc.) []  - 0 Additional assistance / Altered mentation []  - 0 Support Surface(s) Assessment (bed, cushion, seat, etc.) INTERVENTIONS - Wound Cleansing / Measurement Lordi, Crockett S. ([id number]) X- 1 5 Simple Wound Cleansing - one wound []  - 0 Complex Wound Cleansing - multiple wounds X- 1 5 Wound  Imaging (photographs - any number of wounds) []  - 0 Wound Tracing (instead of photographs) X- 1 5 Simple Wound Measurement - one wound []  - 0 Complex Wound  Measurement - multiple wounds INTERVENTIONS - Wound Dressings X - Small Wound Dressing one or multiple wounds 1 10 []  - 0 Medium Wound Dressing one or multiple wounds []  - 0 Large Wound Dressing one or multiple wounds []  - 0 Application of Medications - topical []  - 0 Application of Medications - injection INTERVENTIONS - Miscellaneous []  - External ear exam 0 []  - 0 Specimen Collection (cultures, biopsies, blood, body fluids, etc.) []  - 0 Specimen(s) / Culture(s) sent or taken to Lab for analysis []  - 0 Patient Transfer (multiple staff / / Similar devices) []  - 0 Simple Staple / Suture removal (25 or less) []  - 0 Complex Staple / Suture removal (26 or more) []  - 0 Hypo / Hyperglycemic Management (close monitor of Blood Glucose) []  - 0 Ankle / Brachial Index (ABI) - do not check if billed separately X- 1 5 Vital Signs Has the patient been seen at the hospital within the last three years: Yes Total Score: 75 Level Of Care: New/Established - Level 2 Electronic Signature(s) Signed: 10/07/2020 5:04:08 PM By: , BSN, RN, CWS, Kim RN, BSN Entered By: , BSN, RN, CWS, Kim on 10/07/2020 09:00:57 Diekmann, Clair S ( ) -------------------------------------------------------------------------------- Encounter Discharge Information Details Patient Name: , Zaydrian S. Date of Service: 10/07/2020 8:15 AM Medical Record Number: Patient Account Number: Date of Birth/Sex: [birth year] (60 y.o. M) Treating RN: 10/09/2020 Primary Care Serafino Burciaga: Elliot Gurney Other Clinician: Referring Sherrise Liberto: Elliot Gurney Treating Limuel Nieblas/Extender: 10/09/2020 in Treatment: 8 Encounter Discharge Information Items Discharge Condition: Stable Ambulatory Status: Ambulatory Discharge Destination:  Home Transportation: Private Auto Accompanied By: self Schedule Follow-up Appointment: No Clinical Summary of Care: Electronic Signature(s) Signed: 10/07/2020 5:04:08 PM By: [id number], BSN, RN, CWS, Kim RN, BSN Entered By: Lenon Ahmadi, BSN, RN, CWS, Kim on 10/07/2020 09:02:24 Delellis, Woody [id number] ([id number]) -------------------------------------------------------------------------------- Lower Extremity Assessment Details Patient Name: SIEFERT, [place] S. Date of Service: 10/07/2020 8:15 AM Medical Record Number: Huel Coventry Patient Account Number: Sharlot Gowda Date of Birth/Sex: [birth year] (60 y.o. M) Treating RN: 10/09/2020 Primary Care Latisa Belay: Elliot Gurney Other Clinician: Referring Sharetha Newson: Elliot Gurney Treating Jospeh Mangel/Extender: 10/09/2020 Weeks in Treatment: 8 Edema Assessment Assessed: [Left: Yes] [Right: No] Edema: [Left: N] [Right: o] Calf Left: Right: Point of Measurement: 33 cm From Medial Instep 31 cm Ankle Left: Right: Point of Measurement: 12 cm From Medial Instep 20 cm Knee To Floor Left: Right: From Medial Instep 41 cm Vascular Assessment Pulses: Dorsalis Pedis Palpable: [Left:Yes] Electronic Signature(s) Signed: 10/07/2020 11:20:32 AM By: [id number] Signed: 10/07/2020 11:53:36 AM By: 10/09/2020, [id number] RN Entered By: [id number] on 10/07/2020 08:41:39 Dibenedetto, Amias S. ([id number]) -------------------------------------------------------------------------------- Multi Wound Chart Details Patient Name: Rogers Blocker, Eion S. Date of Service: 10/07/2020 8:15 AM Medical Record Number: Sharlot Gowda Patient Account Number: Allen Derry Date of Birth/Sex: 1960-12-28 (60 y.o. M) Treating RN: 10/09/2020 Primary Care Poppi Scantling: Phillis Haggis Other Clinician: Referring Ranny Wiebelhaus: Dondra Prader Treating Kirsti Mcalpine/Extender: Lolita Cram in Treatment: 8 Vital Signs Height(in): 65 Pulse(bpm): 54 Weight(lbs): 155 Blood Pressure(mmHg): 107/67 Body Mass Index(BMI): 26 Temperature(F):  98.0 Respiratory Rate(breaths/min): 18 Photos: [N/A:N/A] Wound Location: Left, Medial Lower Leg N/A N/A Wounding Event: Trauma N/A N/A Primary Etiology: Venous Leg Ulcer N/A N/A Comorbid History: Glaucoma, Coronary Artery N/A N/A Disease, Hypertension, Neuropathy, Received Chemotherapy Date Acquired: 03/09/2020 N/A N/A Weeks of Treatment: 8 N/A N/A Wound Status: Healed - Epithelialized N/A N/A Clustered Wound: Yes N/A N/A Measurements L x W x D (cm) 0x0x0 N/A N/A Area (cm) :  0 N/A N/A Volume (cm) : 0 N/A N/A % Reduction in Area: 100.00% N/A N/A % Reduction in Volume: 100.00% N/A N/A Classification: Full Thickness Without Exposed N/A N/A Support Structures Exudate Amount: None Present N/A N/A Wound Margin: Flat and Intact N/A N/A Granulation Amount: Large (67-100%) N/A N/A Granulation Quality: Red N/A N/A Necrotic Amount: None Present (0%) N/A N/A Exposed Structures: Fascia: No N/A N/A Fat Layer (Subcutaneous Tissue): No Tendon: No Muscle: No Joint: No Bone: No Epithelialization: Large (67-100%) N/A N/A Treatment Notes Electronic Signature(s) Signed: 10/07/2020 5:04:08 PM By: Gretta Cool, BSN, RN, CWS, Kim RN, BSN Entered By: Gretta Cool, BSN, RN, CWS, Kim on 10/07/2020 08:54:36 [place], Journee Chauncey Cruel ([id number]) -------------------------------------------------------------------------------- Multi-Disciplinary Care Plan Details Patient Name: [name], Brandn S. Date of Service: 10/07/2020 8:15 AM Medical Record Number: [id number] Patient Account Number: [id number] Date of Birth/Sex: 09-Dec-1960 (60 y.o. M) Treating RN: Cornell Barman Primary Care Jonai Weyland: Jill Alexanders Other Clinician: Referring Cary Lothrop: Jill Alexanders Treating Lenette Rau/Extender: Skipper Cliche in Treatment: 8 Active Inactive Electronic Signature(s) Signed: 10/07/2020 5:04:08 PM By: Gretta Cool, BSN, RN, CWS, Kim RN, BSN Entered By: Gretta Cool, BSN, RN, CWS, Kim on 10/07/2020 09:02:51 Batie, Kenrick Chauncey Cruel  ([id number]) -------------------------------------------------------------------------------- Pain Assessment Details Patient Name: PITSTICK, Goldie S. Date of Service: 10/07/2020 8:15 AM Medical Record Number: [id number] Patient Account Number: [id number] Date of Birth/Sex: [birth year] (60 y.o. M) Treating RN: Dolan Amen Primary Care Maijor Hornig: Jill Alexanders Other Clinician: Referring Ryer Asato: Jill Alexanders Treating Kelcie Currie/Extender: Skipper Cliche in Treatment: 8 Active Problems Location of Pain Severity and Description of Pain Patient Has Paino No Site Locations Rate the pain. Current Pain Level: 0 Pain Management and Medication Current Pain Management: Electronic Signature(s) Signed: 10/07/2020 11:20:32 AM By: Jeanine Luz Signed: 10/07/2020 11:53:36 AM By: Georges Mouse, Minus Breeding RN Entered By: Jeanine Luz on 10/07/2020 08:30:04 Ruffolo, Murel S. ([id number]) -------------------------------------------------------------------------------- Patient/Caregiver Education Details Patient Name: [name], Corban S. Date of Service: 10/07/2020 8:15 AM Medical Record Number: [id number] Patient Account Number: [id number] Date of Birth/Gender: March 24, 1961 (60 y.o. M) Treating RN: Cornell Barman Primary Care Physician: Jill Alexanders Other Clinician: Referring Physician: Jill Alexanders Treating Physician/Extender: Skipper Cliche in Treatment: 8 Education Assessment Education Provided To: Patient Education Topics Provided Basic Hygiene: Handouts: Other: Steroid cream daily for 2 weeks, as needed after that Methods: Explain/Verbal Responses: State content correctly Electronic Signature(s) Signed: 10/07/2020 5:04:08 PM By: Gretta Cool, BSN, RN, CWS, Kim RN, BSN Entered By: Gretta Cool, BSN, RN, CWS, Kim on 10/07/2020 09:02:03 Vaux, Derrious SMarland Kitchen ([id number]) -------------------------------------------------------------------------------- Wound Assessment Details Patient Name: [name], Obryan S. Date of Service:  10/07/2020 8:15 AM Medical Record Number: [id number] Patient Account Number: [id number] Date of Birth/Sex: 31-Jul-1960 (60 y.o. M) Treating RN: Cornell Barman Primary Care Braelee Herrle: Jill Alexanders Other Clinician: Referring Quintavis Brands: Jill Alexanders Treating Baron Parmelee/Extender: Jeri Cos Weeks in Treatment: 8 Wound Status Wound Number: 1 Primary Venous Leg Ulcer Etiology: Wound Location: Left, Medial Lower Leg Wound Healed - Epithelialized Wounding Event: Trauma Status: Date Acquired: 03/09/2020 Comorbid Glaucoma, Coronary Artery Disease, Hypertension, Weeks Of Treatment: 8 History: Neuropathy, Received Chemotherapy Clustered Wound: Yes Photos Wound Measurements Length: (cm) 0 Width: (cm) 0 Depth: (cm) 0 Area: (cm) 0 Volume: (cm) 0 % Reduction in Area: 100% % Reduction in Volume: 100% Epithelialization: Large (67-100%) Tunneling: No Undermining: No Wound Description Classification: Full Thickness Without Exposed Support Structure Wound Margin: Flat and Intact Exudate Amount: None Present s Foul Odor After Cleansing: No Slough/Fibrino No Wound Bed Granulation Amount: Large (67-100%) Exposed Structure Granulation Quality: Red Fascia Exposed: No Necrotic Amount: None  Present (0%) Fat Layer (Subcutaneous Tissue) Exposed: No Tendon Exposed: No Muscle Exposed: No Joint Exposed: No Bone Exposed: No Electronic Signature(s) Signed: 10/07/2020 5:04:08 PM By: Gretta Cool, BSN, RN, CWS, Kim RN, BSN Entered By: Gretta Cool, BSN, RN, CWS, Kim on 10/07/2020 08:53:52 Wivell, Teofilo S. ([id number]) -------------------------------------------------------------------------------- Vitals Details Patient Name: [name], Fahd S. Date of Service: 10/07/2020 8:15 AM Medical Record Number: [id number] Patient Account Number: [id number] Date of Birth/Sex: 04/18/1961 (60 y.o. M) Treating RN: Dolan Amen Primary Care Inez Rosato: Jill Alexanders Other Clinician: Referring Brynda Heick: Jill Alexanders Treating  Timeka Goette/Extender: Skipper Cliche in Treatment: 8 Vital Signs Time Taken: 08:25 Temperature (F): 98.0 Height (in): 65 Pulse (bpm): 54 Weight (lbs): 155 Respiratory Rate (breaths/min): 18 Body Mass Index (BMI): 25.8 Blood Pressure (mmHg): 107/67 Reference Range: 80 - 120 mg / dl Electronic Signature(s) Signed: 10/07/2020 11:20:32 AM By: Jeanine Luz Entered By: Jeanine Luz on 10/07/2020 08:29:56

## 2020-10-10 NOTE — Progress Notes (Signed)
CADDEN, ELIZONDO ([id number]) Visit Report for 09/30/2020 Arrival Information Details Patient Name: [name], [name]. Date of Service: 09/30/2020 8:30 AM Medical Record Number: [id number] Patient Account Number: [id number] Date of Birth/Sex: 1961/05/16 (60 y.o. M) Treating RN: Dolan Amen Primary Care Dhyan Noah: Jill Alexanders Other Clinician: Referring Nichlas Pitera: Jill Alexanders Treating [name]/Extender: Skipper Cliche in Treatment: 7 Visit Information History Since Last Visit Added or deleted any medications: No Patient Arrived: Ambulatory Hospitalized since last visit: No Arrival Time: 08:58 Pain Present Now: No Accompanied By: self Transfer Assistance: None Patient Identification Verified: Yes Secondary Verification Process Completed: Yes Patient Requires Transmission-Based No Precautions: Patient Has Alerts: Yes Patient Alerts: Patient on Blood Thinner NOT diabetic Electronic Signature(s) Signed: 09/30/2020 4:34:49 PM By: Georges Mouse, Minus Breeding RN Entered By: Georges Mouse, Minus Breeding on 09/30/2020 08:58:38 [place], [name] ([id number]) -------------------------------------------------------------------------------- Clinic Level of Care Assessment Details Patient Name: [name], [name] Date of Service: 09/30/2020 8:30 AM Medical Record Number: [id number] Patient Account Number: [id number] Date of Birth/Sex: [birth year] (60 y.o. M) Treating RN: Carlene Coria Primary Care Lukus Binion: Jill Alexanders Other Clinician: Referring Eyvette Cordon: Jill Alexanders Treating Montae Stager/Extender: Skipper Cliche in Treatment: 7 Clinic Level of Care Assessment Items TOOL 1 Quantity Score []  - Use when EandM and Procedure is performed on INITIAL visit 0 ASSESSMENTS - Nursing Assessment / Reassessment []  - General Physical Exam (combine w/ comprehensive assessment (listed just below) when performed on new 0 pt. evals) []  - 0 Comprehensive Assessment (HX, ROS, Risk Assessments, Wounds Hx, etc.) ASSESSMENTS - Wound  and Skin Assessment / Reassessment []  - Dermatologic / Skin Assessment (not related to wound area) 0 ASSESSMENTS - Ostomy and/or Continence Assessment and Care []  - Incontinence Assessment and Management 0 []  - 0 Ostomy Care Assessment and Management (repouching, etc.) PROCESS - Coordination of Care []  - Simple Patient / Family Education for ongoing care 0 []  - 0 Complex (extensive) Patient / Family Education for ongoing care []  - 0 Staff obtains Programmer, systems, Records, Test Results / Process Orders []  - 0 Staff telephones HHA, Nursing Homes / Clarify orders / etc []  - 0 Routine Transfer to another Facility (non-emergent condition) []  - 0 Routine Hospital Admission (non-emergent condition) []  - 0 New Admissions / Biomedical engineer / Ordering NPWT, Apligraf, etc. []  - 0 Emergency Hospital Admission (emergent condition) PROCESS - Special Needs []  - Pediatric / Minor Patient Management 0 []  - 0 Isolation Patient Management []  - 0 Hearing / Language / Visual special needs []  - 0 Assessment of Community assistance (transportation, D/C planning, etc.) []  - 0 Additional assistance / Altered mentation []  - 0 Support Surface(s) Assessment (bed, cushion, seat, etc.) INTERVENTIONS - Miscellaneous []  - External ear exam 0 []  - 0 Patient Transfer (multiple staff / Civil Service fast streamer / Similar devices) []  - 0 Simple Staple / Suture removal (25 or less) []  - 0 Complex Staple / Suture removal (26 or more) []  - 0 Hypo/Hyperglycemic Management (do not check if billed separately) []  - 0 Ankle / Brachial Index (ABI) - do not check if billed separately Has the patient been seen at the hospital within the last three years: Yes Total Score: 0 Level Of Care: ____ [name] ([id number]) Electronic Signature(s) Signed: 10/10/2020 8:33:59 AM By: Carlene Coria RN Entered By: Carlene Coria on 09/30/2020 09:27:06 [name], [name]  ([id number]) -------------------------------------------------------------------------------- Compression Therapy Details Patient Name: [name], [name] Date of Service: 09/30/2020 8:30 AM Medical Record Number: [id number] Patient Account Number: [id number] Date of Birth/Sex: [birth year] (60 y.o. M)  Treating RN: Carlene Coria Primary Care Hanifah Royse: Jill Alexanders Other Clinician: Referring Elizaveta Mattice: Jill Alexanders Treating Bonnie Roig/Extender: Skipper Cliche in Treatment: 7 Compression Therapy Performed for Wound Assessment: Wound #1 Left,Medial Lower Leg Performed By: Clinician Carlene Coria, RN Compression Type: Three Layer Post Procedure Diagnosis Same as Pre-procedure Electronic Signature(s) Signed: 10/10/2020 8:33:59 AM By: Carlene Coria RN Entered By: Carlene Coria on 09/30/2020 09:26:53 [name], [name] ([id number]) -------------------------------------------------------------------------------- Encounter Discharge Information Details Patient Name: [name], [name] Date of Service: 09/30/2020 8:30 AM Medical Record Number: [id number] Patient Account Number: [id number] Date of Birth/Sex: 24-May-1961 (60 y.o. M) Treating RN: Donnamarie Poag Primary Care Charles Andringa: Jill Alexanders Other Clinician: Referring Jalyah Weinheimer: Jill Alexanders Treating Cartez Mogle/Extender: Skipper Cliche in Treatment: 7 Encounter Discharge Information Items Discharge Condition: Stable Ambulatory Status: Ambulatory Discharge Destination: Home Transportation: Private Auto Accompanied By: self Schedule Follow-up Appointment: Yes Clinical Summary of Care: Electronic Signature(s) Signed: 09/30/2020 4:26:14 PM By: Donnamarie Poag Entered By: Donnamarie Poag on 09/30/2020 09:42:31 [name], [name] ([id number]) -------------------------------------------------------------------------------- Lower Extremity Assessment Details Patient Name: [name], [name] Date of Service: 09/30/2020 8:30 AM Medical Record Number: [id number] Patient Account Number:  [id number] Date of Birth/Sex: July 25, 1960 (60 y.o. M) Treating RN: Dolan Amen Primary Care Dhillon [place]un[place]: Jill Alexanders Other Clinician: Referring Myasia Sinatra: Jill Alexanders Treating Zavon Hyson/Extender: Jeri Cos Weeks in Treatment: 7 Edema Assessment Assessed: [Left: Yes] [Right: No] Edema: [Left: N] [Right: o] Calf Left: Right: Point of Measurement: 33 cm From Medial Instep 31 cm Ankle Left: Right: Point of Measurement: 12 cm From Medial Instep 20.5 cm Knee To Floor Left: Right: From Medial Instep 41 cm Vascular Assessment Pulses: Dorsalis Pedis Palpable: [Left:Yes] Electronic Signature(s) Signed: 09/30/2020 4:34:49 PM By: Georges Mouse, Minus Breeding RN Entered By: Georges Mouse, Minus Breeding on 09/30/2020 09:11:07 [place], [place]. ([id number]) -------------------------------------------------------------------------------- Multi Wound Chart Details Patient Name: [name], Dionte S. Date of Service: 09/30/2020 8:30 AM Medical Record Number: [id number] Patient Account Number: [id number] Date of Birth/Sex: 06-07-1960 (60 y.o. M) Treating RN: Carlene Coria Primary Care Divine Imber: Jill Alexanders Other Clinician: Referring Clotilde Loth: Jill Alexanders Treating Charnise Lovan/Extender: Skipper Cliche in Treatment: 7 Vital Signs Height(in): 65 Pulse(bpm): 61 Weight(lbs): 155 Blood Pressure(mmHg): 107/66 Body Mass Index(BMI): 26 Temperature(F): 97.9 Respiratory Rate(breaths/min): 18 Photos: [N/A:N/A] Wound Location: Left, Medial Lower Leg N/A N/A Wounding Event: Trauma N/A N/A Primary Etiology: Venous Leg Ulcer N/A N/A Comorbid History: Glaucoma, Coronary Artery N/A N/A Disease, Hypertension, Neuropathy, Received Chemotherapy Date Acquired: 03/09/2020 N/A N/A Weeks of Treatment: 7 N/A N/A Wound Status: Open N/A N/A Clustered Wound: Yes N/A N/A Measurements L x W x D (cm) 0.7x0.6x0.1 N/A N/A Area (cm) : 0.33 N/A N/A Volume (cm) : 0.033 N/A N/A % Reduction in Area: 94.60% N/A N/A % Reduction in Volume:  94.60% N/A N/A Classification: Full Thickness Without Exposed N/A N/A Support Structures Exudate Amount: Medium N/A N/A Exudate Type: Serous N/A N/A Exudate Color: amber N/A N/A Wound Margin: Flat and Intact N/A N/A Granulation Amount: Large (67-100%) N/A N/A Granulation Quality: Red N/A N/A Necrotic Amount: None Present (0%) N/A N/A Exposed Structures: Fat Layer (Subcutaneous Tissue): N/A N/A Yes Fascia: No Tendon: No Muscle: No Joint: No Bone: No Epithelialization: Large (67-100%) N/A N/A Treatment Notes Electronic Signature(s) Signed: 10/10/2020 8:33:59 AM By: Carlene Coria RN Entered By: Carlene Coria on 09/30/2020 09:25:42 [name], [name] ([id number]) [place], [place]. ([id number]) -------------------------------------------------------------------------------- Multi-Disciplinary Care Plan Details Patient Name: [name], Dewell S. Date of Service: 09/30/2020 8:30 AM Medical Record Number: [id number] Patient Account Number: [id number] Date of Birth/Sex: 06/03/1960 (60  y.o. M) Treating RN: Carlene Coria Primary Care Tidus Upchurch: Jill Alexanders Other Clinician: Referring Alani Lacivita: Jill Alexanders Treating Marzell Isakson/Extender: Skipper Cliche in Treatment: 7 Active Inactive Wound/Skin Impairment Nursing Diagnoses: Impaired tissue integrity Goals: Patient/caregiver will verbalize understanding of skin care regimen Date Initiated: 08/09/2020 Target Resolution Date: 10/09/2020 Goal Status: Active Ulcer/skin breakdown will have a volume reduction of 30% by week 4 Date Initiated: 08/09/2020 Date Inactivated: 09/16/2020 Target Resolution Date: 09/09/2020 Goal Status: Met Ulcer/skin breakdown will have a volume reduction of 50% by week 8 Date Initiated: 08/09/2020 Target Resolution Date: 10/09/2020 Goal Status: Active Ulcer/skin breakdown will have a volume reduction of 80% by week 12 Date Initiated: 08/09/2020 Target Resolution Date: 11/09/2020 Goal Status: Active Ulcer/skin breakdown will heal  within 14 weeks Date Initiated: 08/09/2020 Target Resolution Date: 12/09/2020 Goal Status: Active Interventions: Assess patient/caregiver ability to obtain necessary supplies Assess patient/caregiver ability to perform ulcer/skin care regimen upon admission and as needed Assess ulceration(s) every visit Provide education on ulcer and skin care Treatment Activities: Skin care regimen initiated : 08/09/2020 Notes: Electronic Signature(s) Signed: 10/10/2020 8:33:59 AM By: Carlene Coria RN Entered By: Carlene Coria on 09/30/2020 09:25:05 [name], [name] ([id number]) -------------------------------------------------------------------------------- Pain Assessment Details Patient Name: [name], Jyren S. Date of Service: 09/30/2020 8:30 AM Medical Record Number: [id number] Patient Account Number: [id number] Date of Birth/Sex: Apr 18, 1961 (60 y.o. M) Treating RN: Dolan Amen Primary Care Briunna Leicht: Jill Alexanders Other Clinician: Referring Elishia Kaczorowski: Jill Alexanders Treating Tristain Daily/Extender: Skipper Cliche in Treatment: 7 Active Problems Location of Pain Severity and Description of Pain Patient Has Paino No Site Locations Rate the pain. Current Pain Level: 0 Pain Management and Medication Current Pain Management: Electronic Signature(s) Signed: 09/30/2020 4:34:49 PM By: Georges Mouse, Minus Breeding RN Entered By: Georges Mouse, [place] on 09/30/2020 09:07:20 [name], [name] ([id number]) -------------------------------------------------------------------------------- Patient/Caregiver Education Details Patient Name: [name], Pedro S. Date of Service: 09/30/2020 8:30 AM Medical Record Number: [id number] Patient Account Number: [id number] Date of Birth/Gender: 1960-11-23 (60 y.o. M) Treating RN: Carlene Coria Primary Care Physician: Jill Alexanders Other Clinician: Referring Physician: Jill Alexanders Treating Physician/Extender: Skipper Cliche in Treatment: 7 Education Assessment Education Provided  To: Patient Education Topics Provided Wound/Skin Impairment: Methods: Explain/Verbal Responses: State content correctly Electronic Signature(s) Signed: 10/10/2020 8:33:59 AM By: Carlene Coria RN Entered By: Carlene Coria on 09/30/2020 09:27:29 [name], [name] ([id number]) -------------------------------------------------------------------------------- Wound Assessment Details Patient Name: [name], Morry S. Date of Service: 09/30/2020 8:30 AM Medical Record Number: [id number] Patient Account Number: [id number] Date of Birth/Sex: [birth year] (60 y.o. M) Treating RN: Dolan Amen Primary Care Marx Doig: Jill Alexanders Other Clinician: Referring Nicodemus Denk: Jill Alexanders Treating Kwana [name]/Extender: Jeri Cos Weeks in Treatment: 7 Wound Status Wound Number: 1 Primary Venous Leg Ulcer Etiology: Wound Location: Left, Medial Lower Leg Wound Open Wounding Event: Trauma Status: Date Acquired: 03/09/2020 Comorbid Glaucoma, Coronary Artery Disease, Hypertension, Weeks Of Treatment: 7 History: Neuropathy, Received Chemotherapy Clustered Wound: Yes Photos Wound Measurements Length: (cm) 0.7 Width: (cm) 0.6 Depth: (cm) 0.1 Area: (cm) 0.33 Volume: (cm) 0.033 % Reduction in Area: 94.6% % Reduction in Volume: 94.6% Epithelialization: Large (67-100%) Tunneling: No Undermining: No Wound Description Classification: Full Thickness Without Exposed Support Structu Wound Margin: Flat and Intact Exudate Amount: Medium Exudate Type: Serous Exudate Color: amber res Foul Odor After Cleansing: No Slough/Fibrino No Wound Bed Granulation Amount: Large (67-100%) Exposed Structure Granulation Quality: Red Fascia Exposed: No Necrotic Amount: None Present (0%) Fat Layer (Subcutaneous Tissue) Exposed: Yes Tendon Exposed: No Muscle Exposed: No Joint Exposed: No Bone Exposed: No Electronic Signature(s)  Signed: 09/30/2020 4:34:49 PM By: Georges Mouse, Minus Breeding RN Entered By: Georges Mouse, Minus Breeding on  09/30/2020 09:08:35 [place], [place]. ([id number]) -------------------------------------------------------------------------------- Vitals Details Patient Name: [name], Shaquil S. Date of Service: 09/30/2020 8:30 AM Medical Record Number: [id number] Patient Account Number: [id number] Date of Birth/Sex: [birth year] (60 y.o. M) Treating RN: Dolan Amen Primary Care Tarun Patchell: Jill Alexanders Other Clinician: Referring Nyomie Ehrlich: Jill Alexanders Treating Mally Gavina/Extender: Skipper Cliche in Treatment: 7 Vital Signs Time Taken: 08:58 Temperature (F): 97.9 Height (in): 65 Pulse (bpm): 54 Weight (lbs): 155 Respiratory Rate (breaths/min): 18 Body Mass Index (BMI): 25.8 Blood Pressure (mmHg): 107/66 Reference Range: 80 - 120 mg / dl Electronic Signature(s) Signed: 09/30/2020 4:34:49 PM By: Georges Mouse, Minus Breeding RN Entered By: Georges Mouse, Minus Breeding on 09/30/2020 09:07:12

## 2020-10-14 ENCOUNTER — Encounter: Payer: BC Managed Care – PPO | Admitting: Physician Assistant

## 2020-10-17 DIAGNOSIS — Z95818 Presence of other cardiac implants and grafts: Secondary | ICD-10-CM | POA: Diagnosis not present

## 2020-10-17 DIAGNOSIS — I059 Rheumatic mitral valve disease, unspecified: Secondary | ICD-10-CM | POA: Diagnosis not present

## 2020-10-17 DIAGNOSIS — I2510 Atherosclerotic heart disease of native coronary artery without angina pectoris: Secondary | ICD-10-CM | POA: Diagnosis not present

## 2020-10-17 DIAGNOSIS — I10 Essential (primary) hypertension: Secondary | ICD-10-CM | POA: Diagnosis not present

## 2020-10-17 DIAGNOSIS — Z9889 Other specified postprocedural states: Secondary | ICD-10-CM | POA: Diagnosis not present

## 2020-10-21 ENCOUNTER — Encounter: Payer: BC Managed Care – PPO | Admitting: Internal Medicine

## 2020-10-26 DIAGNOSIS — Z9484 Stem cells transplant status: Secondary | ICD-10-CM | POA: Diagnosis not present

## 2020-10-26 DIAGNOSIS — L089 Local infection of the skin and subcutaneous tissue, unspecified: Secondary | ICD-10-CM | POA: Diagnosis not present

## 2020-10-26 DIAGNOSIS — D89813 Graft-versus-host disease, unspecified: Secondary | ICD-10-CM | POA: Diagnosis not present

## 2020-10-26 DIAGNOSIS — C9000 Multiple myeloma not having achieved remission: Secondary | ICD-10-CM | POA: Diagnosis not present

## 2020-10-26 DIAGNOSIS — L988 Other specified disorders of the skin and subcutaneous tissue: Secondary | ICD-10-CM | POA: Diagnosis not present

## 2020-10-26 DIAGNOSIS — Z5181 Encounter for therapeutic drug level monitoring: Secondary | ICD-10-CM | POA: Diagnosis not present

## 2020-10-26 DIAGNOSIS — D89811 Chronic graft-versus-host disease: Secondary | ICD-10-CM | POA: Diagnosis not present

## 2020-10-26 DIAGNOSIS — D801 Nonfamilial hypogammaglobulinemia: Secondary | ICD-10-CM | POA: Diagnosis not present

## 2020-10-26 DIAGNOSIS — Z9481 Bone marrow transplant status: Secondary | ICD-10-CM | POA: Diagnosis not present

## 2020-11-09 DIAGNOSIS — L988 Other specified disorders of the skin and subcutaneous tissue: Secondary | ICD-10-CM | POA: Diagnosis not present

## 2020-11-09 DIAGNOSIS — R252 Cramp and spasm: Secondary | ICD-10-CM | POA: Diagnosis not present

## 2020-11-09 DIAGNOSIS — D89813 Graft-versus-host disease, unspecified: Secondary | ICD-10-CM | POA: Diagnosis not present

## 2020-11-09 DIAGNOSIS — Z5181 Encounter for therapeutic drug level monitoring: Secondary | ICD-10-CM | POA: Diagnosis not present

## 2020-11-09 DIAGNOSIS — C9000 Multiple myeloma not having achieved remission: Secondary | ICD-10-CM | POA: Diagnosis not present

## 2020-11-09 DIAGNOSIS — Z9481 Bone marrow transplant status: Secondary | ICD-10-CM | POA: Diagnosis not present

## 2020-11-09 DIAGNOSIS — D89811 Chronic graft-versus-host disease: Secondary | ICD-10-CM | POA: Diagnosis not present

## 2020-11-09 DIAGNOSIS — L089 Local infection of the skin and subcutaneous tissue, unspecified: Secondary | ICD-10-CM | POA: Diagnosis not present

## 2020-11-09 DIAGNOSIS — D801 Nonfamilial hypogammaglobulinemia: Secondary | ICD-10-CM | POA: Diagnosis not present

## 2020-11-23 DIAGNOSIS — Z5181 Encounter for therapeutic drug level monitoring: Secondary | ICD-10-CM | POA: Diagnosis not present

## 2020-11-23 DIAGNOSIS — Z9481 Bone marrow transplant status: Secondary | ICD-10-CM | POA: Diagnosis not present

## 2020-11-23 DIAGNOSIS — L988 Other specified disorders of the skin and subcutaneous tissue: Secondary | ICD-10-CM | POA: Diagnosis not present

## 2020-11-23 DIAGNOSIS — D89811 Chronic graft-versus-host disease: Secondary | ICD-10-CM | POA: Diagnosis not present

## 2020-11-23 DIAGNOSIS — C9000 Multiple myeloma not having achieved remission: Secondary | ICD-10-CM | POA: Diagnosis not present

## 2020-11-23 DIAGNOSIS — R252 Cramp and spasm: Secondary | ICD-10-CM | POA: Diagnosis not present

## 2020-11-23 DIAGNOSIS — D89813 Graft-versus-host disease, unspecified: Secondary | ICD-10-CM | POA: Diagnosis not present

## 2020-12-07 DIAGNOSIS — C9000 Multiple myeloma not having achieved remission: Secondary | ICD-10-CM | POA: Diagnosis not present

## 2020-12-07 DIAGNOSIS — G63 Polyneuropathy in diseases classified elsewhere: Secondary | ICD-10-CM | POA: Diagnosis not present

## 2020-12-07 DIAGNOSIS — L97921 Non-pressure chronic ulcer of unspecified part of left lower leg limited to breakdown of skin: Secondary | ICD-10-CM | POA: Diagnosis not present

## 2020-12-07 DIAGNOSIS — D89811 Chronic graft-versus-host disease: Secondary | ICD-10-CM | POA: Diagnosis not present

## 2020-12-07 DIAGNOSIS — Z5181 Encounter for therapeutic drug level monitoring: Secondary | ICD-10-CM | POA: Diagnosis not present

## 2020-12-07 DIAGNOSIS — L988 Other specified disorders of the skin and subcutaneous tissue: Secondary | ICD-10-CM | POA: Diagnosis not present

## 2020-12-07 DIAGNOSIS — D89813 Graft-versus-host disease, unspecified: Secondary | ICD-10-CM | POA: Diagnosis not present

## 2020-12-07 DIAGNOSIS — R252 Cramp and spasm: Secondary | ICD-10-CM | POA: Diagnosis not present

## 2020-12-07 DIAGNOSIS — Z9481 Bone marrow transplant status: Secondary | ICD-10-CM | POA: Diagnosis not present

## 2020-12-13 DIAGNOSIS — H401111 Primary open-angle glaucoma, right eye, mild stage: Secondary | ICD-10-CM | POA: Diagnosis not present

## 2020-12-13 DIAGNOSIS — R51 Headache with orthostatic component, not elsewhere classified: Secondary | ICD-10-CM | POA: Diagnosis not present

## 2020-12-13 DIAGNOSIS — H1045 Other chronic allergic conjunctivitis: Secondary | ICD-10-CM | POA: Diagnosis not present

## 2020-12-13 DIAGNOSIS — H16223 Keratoconjunctivitis sicca, not specified as Sjogren's, bilateral: Secondary | ICD-10-CM | POA: Diagnosis not present

## 2021-01-04 DIAGNOSIS — Z9481 Bone marrow transplant status: Secondary | ICD-10-CM | POA: Diagnosis not present

## 2021-01-04 DIAGNOSIS — C9001 Multiple myeloma in remission: Secondary | ICD-10-CM | POA: Diagnosis not present

## 2021-01-04 DIAGNOSIS — D89811 Chronic graft-versus-host disease: Secondary | ICD-10-CM | POA: Diagnosis not present

## 2021-01-04 DIAGNOSIS — L988 Other specified disorders of the skin and subcutaneous tissue: Secondary | ICD-10-CM | POA: Diagnosis not present

## 2021-01-04 DIAGNOSIS — D89813 Graft-versus-host disease, unspecified: Secondary | ICD-10-CM | POA: Diagnosis not present

## 2021-01-04 DIAGNOSIS — Z5181 Encounter for therapeutic drug level monitoring: Secondary | ICD-10-CM | POA: Diagnosis not present

## 2021-01-04 DIAGNOSIS — D801 Nonfamilial hypogammaglobulinemia: Secondary | ICD-10-CM | POA: Diagnosis not present

## 2021-01-04 DIAGNOSIS — R252 Cramp and spasm: Secondary | ICD-10-CM | POA: Diagnosis not present

## 2021-02-01 DIAGNOSIS — H401111 Primary open-angle glaucoma, right eye, mild stage: Secondary | ICD-10-CM | POA: Diagnosis not present

## 2021-02-15 DIAGNOSIS — Z7901 Long term (current) use of anticoagulants: Secondary | ICD-10-CM | POA: Diagnosis not present

## 2021-02-15 DIAGNOSIS — C9000 Multiple myeloma not having achieved remission: Secondary | ICD-10-CM | POA: Diagnosis not present

## 2021-02-15 DIAGNOSIS — L818 Other specified disorders of pigmentation: Secondary | ICD-10-CM | POA: Diagnosis not present

## 2021-02-15 DIAGNOSIS — D89811 Chronic graft-versus-host disease: Secondary | ICD-10-CM | POA: Diagnosis not present

## 2021-02-15 DIAGNOSIS — Z79899 Other long term (current) drug therapy: Secondary | ICD-10-CM | POA: Diagnosis not present

## 2021-02-15 DIAGNOSIS — C9001 Multiple myeloma in remission: Secondary | ICD-10-CM | POA: Diagnosis not present

## 2021-02-15 DIAGNOSIS — Z9481 Bone marrow transplant status: Secondary | ICD-10-CM | POA: Diagnosis not present

## 2021-02-15 DIAGNOSIS — Z951 Presence of aortocoronary bypass graft: Secondary | ICD-10-CM | POA: Diagnosis not present

## 2021-02-15 DIAGNOSIS — G629 Polyneuropathy, unspecified: Secondary | ICD-10-CM | POA: Diagnosis not present

## 2021-02-15 DIAGNOSIS — T865 Complications of stem cell transplant: Secondary | ICD-10-CM | POA: Diagnosis not present

## 2021-02-15 DIAGNOSIS — D509 Iron deficiency anemia, unspecified: Secondary | ICD-10-CM | POA: Diagnosis not present

## 2021-02-15 DIAGNOSIS — L03116 Cellulitis of left lower limb: Secondary | ICD-10-CM | POA: Diagnosis not present

## 2021-02-15 DIAGNOSIS — D84821 Immunodeficiency due to drugs: Secondary | ICD-10-CM | POA: Diagnosis not present

## 2021-02-15 DIAGNOSIS — Z86718 Personal history of other venous thrombosis and embolism: Secondary | ICD-10-CM | POA: Diagnosis not present

## 2021-02-15 DIAGNOSIS — M25551 Pain in right hip: Secondary | ICD-10-CM | POA: Diagnosis not present

## 2021-02-15 DIAGNOSIS — Y830 Surgical operation with transplant of whole organ as the cause of abnormal reaction of the patient, or of later complication, without mention of misadventure at the time of the procedure: Secondary | ICD-10-CM | POA: Diagnosis not present

## 2021-02-15 DIAGNOSIS — M109 Gout, unspecified: Secondary | ICD-10-CM | POA: Diagnosis not present

## 2021-02-15 DIAGNOSIS — I2510 Atherosclerotic heart disease of native coronary artery without angina pectoris: Secondary | ICD-10-CM | POA: Diagnosis not present

## 2021-02-23 DIAGNOSIS — Z48812 Encounter for surgical aftercare following surgery on the circulatory system: Secondary | ICD-10-CM | POA: Diagnosis not present

## 2021-02-23 DIAGNOSIS — Z951 Presence of aortocoronary bypass graft: Secondary | ICD-10-CM | POA: Diagnosis not present

## 2021-02-23 DIAGNOSIS — Z23 Encounter for immunization: Secondary | ICD-10-CM | POA: Diagnosis not present

## 2021-02-23 DIAGNOSIS — I2510 Atherosclerotic heart disease of native coronary artery without angina pectoris: Secondary | ICD-10-CM | POA: Diagnosis not present

## 2021-03-24 DIAGNOSIS — T380X5A Adverse effect of glucocorticoids and synthetic analogues, initial encounter: Secondary | ICD-10-CM | POA: Diagnosis not present

## 2021-03-24 DIAGNOSIS — H4061X0 Glaucoma secondary to drugs, right eye, stage unspecified: Secondary | ICD-10-CM | POA: Diagnosis not present

## 2021-04-12 DIAGNOSIS — D89813 Graft-versus-host disease, unspecified: Secondary | ICD-10-CM | POA: Diagnosis not present

## 2021-04-12 DIAGNOSIS — L988 Other specified disorders of the skin and subcutaneous tissue: Secondary | ICD-10-CM | POA: Diagnosis not present

## 2021-04-12 DIAGNOSIS — D89811 Chronic graft-versus-host disease: Secondary | ICD-10-CM | POA: Diagnosis not present

## 2021-04-12 DIAGNOSIS — Z5181 Encounter for therapeutic drug level monitoring: Secondary | ICD-10-CM | POA: Diagnosis not present

## 2021-04-12 DIAGNOSIS — R0600 Dyspnea, unspecified: Secondary | ICD-10-CM | POA: Diagnosis not present

## 2021-04-12 DIAGNOSIS — G620 Drug-induced polyneuropathy: Secondary | ICD-10-CM | POA: Diagnosis not present

## 2021-04-12 DIAGNOSIS — Z8739 Personal history of other diseases of the musculoskeletal system and connective tissue: Secondary | ICD-10-CM | POA: Diagnosis not present

## 2021-04-12 DIAGNOSIS — Z9484 Stem cells transplant status: Secondary | ICD-10-CM | POA: Diagnosis not present

## 2021-04-12 DIAGNOSIS — Z23 Encounter for immunization: Secondary | ICD-10-CM | POA: Diagnosis not present

## 2021-04-12 DIAGNOSIS — K5909 Other constipation: Secondary | ICD-10-CM | POA: Diagnosis not present

## 2021-04-12 DIAGNOSIS — C9001 Multiple myeloma in remission: Secondary | ICD-10-CM | POA: Diagnosis not present

## 2021-04-12 DIAGNOSIS — R0689 Other abnormalities of breathing: Secondary | ICD-10-CM | POA: Diagnosis not present

## 2021-04-12 DIAGNOSIS — D801 Nonfamilial hypogammaglobulinemia: Secondary | ICD-10-CM | POA: Diagnosis not present

## 2021-04-12 DIAGNOSIS — Z9481 Bone marrow transplant status: Secondary | ICD-10-CM | POA: Diagnosis not present

## 2021-04-12 DIAGNOSIS — T451X5A Adverse effect of antineoplastic and immunosuppressive drugs, initial encounter: Secondary | ICD-10-CM | POA: Diagnosis not present

## 2021-04-12 DIAGNOSIS — C9000 Multiple myeloma not having achieved remission: Secondary | ICD-10-CM | POA: Diagnosis not present

## 2021-04-27 DIAGNOSIS — Z79899 Other long term (current) drug therapy: Secondary | ICD-10-CM | POA: Diagnosis not present

## 2021-04-27 DIAGNOSIS — Z951 Presence of aortocoronary bypass graft: Secondary | ICD-10-CM | POA: Diagnosis not present

## 2021-04-27 DIAGNOSIS — T380X5A Adverse effect of glucocorticoids and synthetic analogues, initial encounter: Secondary | ICD-10-CM | POA: Diagnosis not present

## 2021-04-27 DIAGNOSIS — Z86711 Personal history of pulmonary embolism: Secondary | ICD-10-CM | POA: Diagnosis not present

## 2021-04-27 DIAGNOSIS — I2510 Atherosclerotic heart disease of native coronary artery without angina pectoris: Secondary | ICD-10-CM | POA: Diagnosis not present

## 2021-04-27 DIAGNOSIS — I10 Essential (primary) hypertension: Secondary | ICD-10-CM | POA: Diagnosis not present

## 2021-04-27 DIAGNOSIS — Z7901 Long term (current) use of anticoagulants: Secondary | ICD-10-CM | POA: Diagnosis not present

## 2021-04-27 DIAGNOSIS — Z86718 Personal history of other venous thrombosis and embolism: Secondary | ICD-10-CM | POA: Diagnosis not present

## 2021-04-27 DIAGNOSIS — Z9481 Bone marrow transplant status: Secondary | ICD-10-CM | POA: Diagnosis not present

## 2021-04-27 DIAGNOSIS — H4061X3 Glaucoma secondary to drugs, right eye, severe stage: Secondary | ICD-10-CM | POA: Diagnosis not present

## 2021-04-27 DIAGNOSIS — H40051 Ocular hypertension, right eye: Secondary | ICD-10-CM | POA: Diagnosis not present

## 2021-04-27 DIAGNOSIS — Z9484 Stem cells transplant status: Secondary | ICD-10-CM | POA: Diagnosis not present

## 2021-04-27 DIAGNOSIS — Z79621 Long term (current) use of calcineurin inhibitor: Secondary | ICD-10-CM | POA: Diagnosis not present

## 2021-04-27 DIAGNOSIS — Z8579 Personal history of other malignant neoplasms of lymphoid, hematopoietic and related tissues: Secondary | ICD-10-CM | POA: Diagnosis not present

## 2021-05-04 DIAGNOSIS — D89813 Graft-versus-host disease, unspecified: Secondary | ICD-10-CM | POA: Diagnosis not present

## 2021-05-04 DIAGNOSIS — Z48810 Encounter for surgical aftercare following surgery on the sense organs: Secondary | ICD-10-CM | POA: Diagnosis not present

## 2021-05-04 DIAGNOSIS — H04123 Dry eye syndrome of bilateral lacrimal glands: Secondary | ICD-10-CM | POA: Diagnosis not present

## 2021-05-27 ENCOUNTER — Ambulatory Visit: Admit: 2021-05-27 | Discharge status: Absent without leave | Payer: BC Managed Care – PPO

## 2021-06-14 DIAGNOSIS — H401111 Primary open-angle glaucoma, right eye, mild stage: Secondary | ICD-10-CM | POA: Diagnosis not present

## 2021-06-15 ENCOUNTER — Other Ambulatory Visit: Payer: Self-pay

## 2021-06-15 ENCOUNTER — Ambulatory Visit: Payer: BC Managed Care – PPO | Admitting: Medical

## 2021-06-15 VITALS — BP 104/60 | HR 48 | Temp 96.4°F | Wt 154.6 lb

## 2021-06-15 DIAGNOSIS — C9001 Multiple myeloma in remission: Secondary | ICD-10-CM

## 2021-06-15 DIAGNOSIS — D89813 Graft-versus-host disease, unspecified: Secondary | ICD-10-CM

## 2021-06-15 DIAGNOSIS — R001 Bradycardia, unspecified: Secondary | ICD-10-CM

## 2021-06-15 DIAGNOSIS — J069 Acute upper respiratory infection, unspecified: Secondary | ICD-10-CM

## 2021-06-15 DIAGNOSIS — H5700 Unspecified anomaly of pupillary function: Secondary | ICD-10-CM

## 2021-06-15 DIAGNOSIS — R519 Headache, unspecified: Secondary | ICD-10-CM

## 2021-06-15 DIAGNOSIS — L988 Other specified disorders of the skin and subcutaneous tissue: Secondary | ICD-10-CM

## 2021-06-15 MED ORDER — AMOXICILLIN 875 MG PO TABS: 875.0000 mg | ORAL_TABLET | Freq: Two times a day (BID) | ORAL | 0 refills | Status: DC

## 2021-06-15 NOTE — Progress Notes (Signed)
Subjective:  [name] is a 61 y.o. male who presents for Chief Complaint  Patient presents with   headache    Headaches- since new years.     Here for complaint of headache for about 2 and half weeks.  He notes having a viral respiratory tract infection around [place].  At that time symptoms were cold-like symptoms with headache, runny nose, postnasal drainage, upper chest congestion, phlegm, cough.  He never had fever.  He had little blood-tinged mucus at that time.  That went away.  Overall his symptoms resolved after about 5 or so days but then has had persistent headaches that started with the illness.  Headache is more frontal, moves up to the top of his head.  He does get some photophobia and some nausea at times.  No vision change, no slurred speech, no numbness or tingling or weakness, no vomiting, no chest pain or palpitations, no difficulty breathing.  No general body aches or chills.  No recent unexplained weight loss.  No fevers.  Otherwise has been in normal state of health.  He denies any ongoing headaches prior to his viral illness over [place].  He wonders about sinus pressure, sinus infection.  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  History of abnormal pressures in the right and abnormal disease of the right eyeHe has Zofran at home he can use for nausea.  No other aggravating or relieving factors.    No other c/o.  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 Roni Bread, [facility]; [facility] 01/2013 EF 57%, No ischemia or Infarction.   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  Mildly dilated LV with normal systolic function (EF 01-02%), moderate LA dilation, upper normal PA pressures ~ 34 mmHg.   Monoclonal gammopathy 09/01/2012   Increased total protein, increased beta globulin peak with decreased gammaglobulin peak on SPEP 08/18/12  IFE not done "possible faint abnormal band"   Multiple myeloma, without mention of having achieved remission 10/14/2012   Stage I Durie-[place]mon; good prognosis international scoring system: see 10/13/12 progress note --> ? in Remission as of 06/19/2013; ? Failed Auto BMT; Continues to be on Chemo  with planned Donor BMT   Normochromic normocytic anemia 09/01/2012   Hb 12.7, MCV 95  08/18/12 was 13.7 1 year ago; concomitant leukopenia  & increased lymphs   Pneumococcal vaccine refused 08/15/2012   Current Outpatient Medications on File Prior to Visit  Medication Sig Dispense Refill   acyclovir (ZOVIRAX) 400  MG tablet TAKE ONE TABLET BY MOUTH TWICE DAILY 60 tablet 0   albuterol (VENTOLIN HFA) 108 (90 Base) MCG/ACT inhaler Inhale 2 puffs into the lungs every 4 (four) hours as needed for wheezing or shortness of breath (or cough). 18 g 0   Apixaban (ELIQUIS PO) Take 1 tablet by mouth 2 (two) times daily.     cholecalciferol (VITAMIN D) 1000 units tablet Take 1,000 Units by mouth once a week.     dorzolamide-timolol (COSOPT) 22.3-6.8 MG/ML ophthalmic solution 1 drop 2 (two) times daily.     lisinopril (PRINIVIL,ZESTRIL)  2.5 MG tablet Take 2.5 mg by mouth daily.     omeprazole (PRILOSEC) 40 MG capsule Take by mouth daily.     ondansetron (ZOFRAN-ODT) 8 MG disintegrating tablet DISSOLVE 1 TABLET IN MOUTH EVERY 8 HOURS AS NEEDED FOR NAUSEA FOR UP TO 3 DAYS     POSACONAZOLE PO Take by mouth.     pregabalin (LYRICA) 100 MG capsule Take 100 mg by mouth 2 (two) times daily.     Ruxolitinib Phosphate (JAKAFI PO) Take by mouth.     sildenafil (REVATIO) 20 MG tablet Take 1 to 5 pills as needed 100 tablet 5   TACROLIMUS PO Take by mouth.     Vitamin D, Ergocalciferol, (DRISDOL) 1.25 MG (50000 UT) CAPS capsule Take by mouth.     [DISCONTINUED] allopurinol (ZYLOPRIM) 100 MG tablet Take 100 mg by mouth daily.     [DISCONTINUED] amLODipine (NORVASC) 5 MG tablet Take 5 mg by mouth daily.     [DISCONTINUED] azelastine (ASTELIN) 0.1 % nasal spray Place 2 sprays into both nostrils 2 (two) times daily. Use in each nostril as directed (Patient not taking: Reported on 09/14/2019) 30 mL 12   [DISCONTINUED] mometasone (NASONEX) 50 MCG/ACT nasal spray Place 2 sprays into the nose as needed.     No current facility-administered medications on file prior to visit.     The following portions of the patient's history were reviewed and updated as appropriate: allergies, current medications, past family history, past medical history, past social history, past surgical history and problem list.  ROS Otherwise as in subjective above  Objective: BP 104/60    Pulse (!) 48    Temp (!) 96.4 F (35.8 C)    Wt 154 lb 9.6 oz (70.1 kg)    BMI 25.73 kg/m   General appearance: alert, no distress, well developed, well nourished HEENT: normocephalic, sclerae anicteric, right pupil larger diameter than left with less reaction to light, conjunctiva pink and moist, TMs pearly, nares patent, no discharge positive slight erythema, pharynx normal Oral cavity: MMM, no lesions in both ears neck: supple, no lymphadenopathy, no thyromegaly, no masses Neck:  No obvious lymphadenopathy or mass. Heart: RRR, normal S1, S2, no murmurs Lungs: CTA bilaterally, no wheezes, rhonchi, or rales Pulses: 2+ radial pulses, 2+ pedal pulses, normal cap refill Ext: no edema Neuro: CN II through XII intact, nonfocal exam     Assessment: Encounter Diagnoses  Name Primary?   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  Of note bradycardia is longstanding according to patient.  He also notes history of abnormal eye issues on the right and asymmetric pupil  Although he is a  high risk individual with new onset headaches, he likely has some residual sinus infection from his recent respiratory tract infection.  No major reason to be alarmed with new onset headaches otherwise.  No other red flag symptoms today.  Begin antibiotic below.  Advised the can use Mucinex or allergy pill short-term to help with mucus.  Advised good water intake.  He had been using Sudafed and Aleve.  I asked him to stop both medicines given his other medications and given the potential for bleeding on Eliquis.  If not completely resolved within 10 to 14 days then recheck with PCP here.  We discussed that new onset headaches with an individual this age could be alarming if there are other red flag symptoms or if the headaches continue despite our recommendation today  [place] was seen today for headache.  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    Follow up: 10 days

## 2021-06-18 ENCOUNTER — Other Ambulatory Visit: Payer: Self-pay | Admitting: Family Medicine

## 2021-06-18 MED ORDER — TRAMADOL HCL 50 MG PO TABS: 50.0000 mg | ORAL_TABLET | Freq: Three times a day (TID) | ORAL | 0 refills | Status: AC | PRN

## 2021-06-18 NOTE — Progress Notes (Signed)
He is being treated for sinus infection but Tylenol is not helping with the pain.  I will call in tramadol.

## 2021-06-19 ENCOUNTER — Other Ambulatory Visit: Payer: Self-pay | Admitting: Medical

## 2021-06-19 ENCOUNTER — Encounter: Payer: Self-pay | Admitting: Emergency Medicine

## 2021-06-19 ENCOUNTER — Emergency Department (HOSPITAL_COMMUNITY): Payer: BC Managed Care – PPO

## 2021-06-19 ENCOUNTER — Emergency Department (HOSPITAL_COMMUNITY)
Admission: EM | Admit: 2021-06-19 | Discharge: 2021-06-20 | Disposition: A | Discharge status: Home / Self Care | Payer: BC Managed Care – PPO | Attending: Student | Admitting: Student

## 2021-06-19 ENCOUNTER — Ambulatory Visit
Admission: EM | Admit: 2021-06-19 | Discharge: 2021-06-19 | Disposition: A | Discharge status: Home / Self Care | Payer: BC Managed Care – PPO

## 2021-06-19 ENCOUNTER — Telehealth: Payer: Self-pay | Admitting: Family Medicine

## 2021-06-19 ENCOUNTER — Other Ambulatory Visit: Payer: Self-pay

## 2021-06-19 DIAGNOSIS — Z7901 Long term (current) use of anticoagulants: Secondary | ICD-10-CM | POA: Insufficient documentation

## 2021-06-19 DIAGNOSIS — I10 Essential (primary) hypertension: Secondary | ICD-10-CM | POA: Diagnosis not present

## 2021-06-19 DIAGNOSIS — Z20822 Contact with and (suspected) exposure to covid-19: Secondary | ICD-10-CM | POA: Insufficient documentation

## 2021-06-19 DIAGNOSIS — R519 Headache, unspecified: Secondary | ICD-10-CM | POA: Diagnosis not present

## 2021-06-19 DIAGNOSIS — G43809 Other migraine, not intractable, without status migrainosus: Secondary | ICD-10-CM | POA: Insufficient documentation

## 2021-06-19 DIAGNOSIS — R112 Nausea with vomiting, unspecified: Secondary | ICD-10-CM

## 2021-06-19 DIAGNOSIS — Z951 Presence of aortocoronary bypass graft: Secondary | ICD-10-CM | POA: Diagnosis not present

## 2021-06-19 DIAGNOSIS — I2510 Atherosclerotic heart disease of native coronary artery without angina pectoris: Secondary | ICD-10-CM | POA: Diagnosis not present

## 2021-06-19 DIAGNOSIS — I161 Hypertensive emergency: Secondary | ICD-10-CM | POA: Diagnosis not present

## 2021-06-19 DIAGNOSIS — Z79899 Other long term (current) drug therapy: Secondary | ICD-10-CM | POA: Insufficient documentation

## 2021-06-19 LAB — COMPREHENSIVE METABOLIC PANEL
ALT: 32 U/L (ref 0–44)
AST: 34 U/L (ref 15–41)
Albumin: 4.8 g/dL (ref 3.5–5.0)
Alkaline Phosphatase: 39 U/L (ref 38–126)
Anion gap: 10 (ref 5–15)
BUN: 15 mg/dL (ref 8–23)
CO2: 24 mmol/L (ref 22–32)
Calcium: 9 mg/dL (ref 8.9–10.3)
Chloride: 102 mmol/L (ref 98–111)
Creatinine, Ser: 0.85 mg/dL (ref 0.61–1.24)
GFR, Estimated: >60 mL/min (ref >60)
Glucose, Bld: 105 mg/dL — ABNORMAL HIGH (ref 70–99)
Potassium: 4.2 mmol/L (ref 3.5–5.1)
Sodium: 136 mmol/L (ref 135–145)
Total Bilirubin: 1 mg/dL (ref 0.3–1.2)
Total Protein: 7.8 g/dL (ref 6.5–8.1)

## 2021-06-19 LAB — CBC
HCT: 39.7 % (ref 39.0–52.0)
Hemoglobin: 13.5 g/dL (ref 13.0–17.0)
MCH: 33.3 pg (ref 26.0–34.0)
MCHC: 34 g/dL (ref 30.0–36.0)
MCV: 97.8 fL (ref 80.0–100.0)
Platelets: 188 10*3/uL (ref 150–400)
RBC: 4.06 MIL/uL — ABNORMAL LOW (ref 4.22–5.81)
RDW: 14.8 % (ref 11.5–15.5)
WBC: 4.5 10*3/uL (ref 4.0–10.5)
nRBC: 0 % (ref 0.0–0.2)

## 2021-06-19 LAB — RESP PANEL BY RT-PCR (FLU A&B, COVID) ARPGX2
Influenza A by PCR: NEGATIVE
Influenza B by PCR: NEGATIVE
SARS Coronavirus 2 by RT PCR: NEGATIVE

## 2021-06-19 LAB — LIPASE, BLOOD: Lipase: 27 U/L (ref 11–51)

## 2021-06-19 MED ORDER — LORAZEPAM 2 MG/ML IJ SOLN
0.5000 mg | Freq: Once | INTRAMUSCULAR | Status: AC
Start: 2021-06-19 — End: 2021-06-19
  Administered 2021-06-19: 0.5 mg via INTRAVENOUS
  Filled 2021-06-19: qty 1

## 2021-06-19 MED ORDER — ONDANSETRON 8 MG PO TBDP: ORAL_TABLET | ORAL | 2 refills | Status: DC

## 2021-06-19 MED ORDER — DIPHENHYDRAMINE HCL 50 MG/ML IJ SOLN: 25.0000 mg | Freq: Once | INTRAMUSCULAR | Status: DC

## 2021-06-19 MED ORDER — PROCHLORPERAZINE EDISYLATE 10 MG/2ML IJ SOLN
10.0000 mg | Freq: Once | INTRAMUSCULAR | Status: AC
  Administered 2021-06-19: 10 mg via INTRAVENOUS
  Filled 2021-06-19: qty 2

## 2021-06-19 MED ORDER — LACTATED RINGERS IV BOLUS
1000.0000 mL | Freq: Once | INTRAVENOUS | Status: AC
  Administered 2021-06-19: 1000 mL via INTRAVENOUS

## 2021-06-19 MED ORDER — ONDANSETRON 4 MG PO TBDP
4.0000 mg | ORAL_TABLET | Freq: Once | ORAL | Status: AC | PRN
  Administered 2021-06-19: 4 mg via ORAL
  Filled 2021-06-19: qty 1

## 2021-06-19 MED ORDER — KETOROLAC TROMETHAMINE 15 MG/ML IJ SOLN: 15.0000 mg | Freq: Once | INTRAMUSCULAR | Status: DC

## 2021-06-19 NOTE — ED Provider Notes (Signed)
[facility] EMERGENCY DEPARTMENT Provider Note  CSN: [id number] Arrival date & time: 06/19/21 1604  Chief Complaint(s) Headache and Hypertension  HPI CARVEL HUSKINS is a 61 y.o. 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  Patient states that he has had a persistent headache for the last 2 weeks that has gradually been worsening.  He saw his PCP on 06/15/2021 who prescribed amoxicillin but his headache persisted and now he had associated nausea and vomiting.  He went to urgent care today who transfer the patient to the emergency department for hypertensive emergency with systolic blood pressures greater than 200 in setting of a headache.  Patient had no focal motor or sensory deficits.  No chest pain, shortness of breath, abdominal pain, numbness, tingling or other systemic symptoms.   Headache Hypertension Associated symptoms include headaches.   Past Medical History Past Medical History:  Diagnosis Date   Anxiety    Aortic valve insufficiency, senile calcific October 14   Moderate regurgitation, eccentric towards Ant MV Leaflet   Bence Jones proteinuria 09/01/2012   Borderline increase protein 190 mg on 24 hr urine IFE with free kappa light chains (lab normal 50-100 mg)  08/18/12   CAD in native artery    s/p CABG; -- Cards: Dr. Roni Bread, [facility]; [facility] 01/2013 EF 57%, No ischemia or Infarction.   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 Elisabeth Cara   Hyperlipidemia LDL goal <70    Leukopenia 09/01/2012   WBC 3,700 42 poly, 48 lymphs, 9 monos 08/18/12  Was 3,700 1 year ago   Mitral valve anterior leaflet prolapse Oct  2014   Echo - Severe anterior prolapse   Moderate mitral regurgitation by prior echocardiogram October 2014   Echo 02/2013: Severe holosystolic Ant MV Leaflet prolapse, Moderate regurgitation.  Mildly dilated LV with normal systolic function (EF 88-50%), moderate LA dilation, upper normal PA pressures ~ 34 mmHg.   Monoclonal gammopathy 09/01/2012   Increased total protein, increased beta globulin peak with decreased gammaglobulin peak on SPEP 08/18/12  IFE not done "possible faint abnormal band"   Multiple myeloma, without mention of having achieved remission 10/14/2012   Stage I Durie-Salmon; good prognosis international scoring system: see 10/13/12 progress note --> ? in Remission as of 06/19/2013; ? 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 ([place]) 11/29/2016   Dyslipidemia, goal LDL below 70 04/03/2014   Aortic valve insufficiency, senile calcific    Multiple myeloma in remission ([place]) 06/19/2013   Moderate mitral regurgitation by prior echocardiogram 02/25/2013   Mitral valve anterior leaflet prolapse 02/25/2013   Hx of valvular heart disease - MVP with midl-mod MR, Mild AI with Aortic Sclerosis 02/16/2013   CAD in native artery -- s/p CABG 02/16/2013   Monoclonal gammopathy 09/01/2012   Normochromic normocytic anemia 09/01/2012   Leukopenia 09/01/2012  Hx of CABG 08/02/2011   Hyperlipidemia LDL goal <70 08/02/2011   Allergic rhinitis, mild 08/02/2011   Arthralgia of hand 10/11/2010   Back pain 10/11/2010   Fatigue 10/11/2010   ED (erectile dysfunction) 10/11/2010   Home Medication(s) Prior to Admission medications   Medication Sig Start Date End Date  Taking? Authorizing Provider  acyclovir (ZOVIRAX) 400 MG tablet TAKE ONE TABLET BY MOUTH TWICE DAILY   Yes Granfortuna, Alyson Locket, MD  albuterol (VENTOLIN HFA) 108 (90 Base) MCG/ACT inhaler Inhale 2 puffs into the lungs every 4 (four) hours as needed for wheezing or shortness of breath (or cough). 11/21/19  Yes Yu, Amy V, PA-C  allopurinol (ZYLOPRIM) 100 MG tablet Take 100 mg by mouth daily. 06/08/21  Yes [provider]  amoxicillin (AMOXIL) 875 MG tablet Take 1 tablet (875 mg total) by mouth 2 (two) times daily for 10 days. 06/15/21 06/25/21 Yes Tysinger, Camelia Eng, PA-C  apixaban (ELIQUIS) 2.5 MG TABS tablet Take 2.5 mg by mouth every 12 (twelve) hours.   Yes [provider]  Dorzolamide HCl-Timolol Mal PF 2-0.5 % SOLN Place 1 drop into the right eye 2 (two) times daily. 03/15/21  Yes [provider]  JAKAFI 10 MG tablet Take 10 mg by mouth 2 (two) times daily. 03/15/21  Yes [provider]  lisinopril (ZESTRIL) 10 MG tablet Take 10 mg by mouth daily. 06/08/21  Yes [provider]  magnesium oxide (MAG-OX) 400 MG tablet Take 1 tablet by mouth 2 (two) times daily. 06/08/21  Yes [provider]  omeprazole (PRILOSEC) 40 MG capsule Take 40 mg by mouth daily. 11/10/18  Yes [provider]  ondansetron (ZOFRAN-ODT) 8 MG disintegrating tablet DISSOLVE 1 TABLET IN MOUTH EVERY 8 HOURS AS NEEDED FOR NAUSEA FOR UP TO 3 DAYS 06/19/21  Yes Tysinger, Camelia Eng, PA-C  posaconazole (NOXAFIL) 100 MG TBEC delayed-release tablet Take 300 mg by mouth daily. 06/07/21  Yes [provider]  pregabalin (LYRICA) 50 MG capsule Take 50 mg by mouth 3 (three) times a week. 03/30/21  Yes [provider]  rosuvastatin (CRESTOR) 10 MG tablet Take 10 mg by mouth daily. 05/11/21  Yes [provider]  sildenafil (REVATIO) 20 MG tablet Take 1 to 5 pills as needed Patient taking differently: Take 20 mg by mouth daily as needed. Take 1 to 5 pills as needed  11/29/16  Yes Denita Lung, MD  tacrolimus (PROGRAF) 0.5 MG capsule Take 0.5 mg by mouth daily. 06/16/21  Yes [provider]  thiamine 100 MG tablet Take 100 mg by mouth daily. 06/08/21  Yes [provider]  traMADol (ULTRAM) 50 MG tablet Take 1 tablet (50 mg total) by mouth every 8 (eight) hours as needed for up to 5 days. 06/18/21 06/23/21 Yes Denita Lung, MD  Vitamin D, Ergocalciferol, (DRISDOL) 1.25 MG (50000 UT) CAPS capsule Take 50,000 Units by mouth every 7 (seven) days. 11/06/18  Yes [provider]  amLODipine (NORVASC) 5 MG tablet Take 5 mg by mouth daily.  11/21/19  [provider]  azelastine (ASTELIN) 0.1 % nasal spray Place 2 sprays into both nostrils 2 (two) times daily. Use in each nostril as directed Patient not taking: Reported on 09/14/2019 01/03/16 11/21/19  Denita Lung, MD  mometasone (NASONEX) 50 MCG/ACT nasal spray Place 2 sprays into the nose as needed. 09/25/12 11/21/19  Denita Lung, MD  Past Surgical History Past Surgical History:  Procedure Laterality Date   CARDIAC SURGERY     COLONOSCOPY     ?   CORONARY ARTERY BYPASS GRAFT  10/2003   LIMA to LAD, RIMA to OM1, sequential to OM2; Dr. Elisabeth Cara   MITRAL VALVE SURGERY     Family History Family History  Problem Relation Age of Onset   Heart disease Father    Hypertension Father    Heart failure Father    Heart attack Brother        at 71   Heart attack Brother        at 52   Healthy Mother     Social History Social History   Tobacco Use   Smoking status: Never   Smokeless tobacco: Never  Vaping Use   Vaping Use: Never used  Substance Use Topics   Alcohol use: No   Drug use: No   Allergies Atorvastatin, Chlorhexidine gluconate, and Toprol xl [metoprolol tartrate]  Review of Systems Review of Systems  Neurological:  Positive for  headaches.   Physical Exam Vital Signs  I have reviewed the triage vital signs BP (!) 171/75    Pulse 78    Temp 99 F (37.2 C) (Oral)    Resp 16    SpO2 98%   Physical Exam Vitals and nursing note reviewed.  Constitutional:      General: He is not in acute distress.    Appearance: He is well-developed.  HENT:     Head: Normocephalic and atraumatic.  Eyes:     Conjunctiva/sclera: Conjunctivae normal.  Cardiovascular:     Rate and Rhythm: Normal rate and regular rhythm.     Heart sounds: No murmur heard. Pulmonary:     Effort: Pulmonary effort is normal. No respiratory distress.     Breath sounds: Normal breath sounds.  Abdominal:     Palpations: Abdomen is soft.     Tenderness: There is no abdominal tenderness.  Musculoskeletal:        General: No swelling.     Cervical back: Neck supple.  Skin:    General: Skin is warm and dry.     Capillary Refill: Capillary refill takes less than 2 seconds.  Neurological:     Mental Status: He is alert.     Cranial Nerves: No cranial nerve deficit.     Sensory: No sensory deficit.     Motor: No weakness.  Psychiatric:        Mood and Affect: Mood normal.    ED Results and Treatments Labs (all labs ordered are listed, but only abnormal results are displayed) Labs Reviewed  COMPREHENSIVE METABOLIC PANEL - Abnormal; Notable for the following components:      Result Value   Glucose, Bld 105 (*)    All other components within normal limits  CBC - Abnormal; Notable for the following components:   RBC 4.06 (*)    All other components within normal limits  RESP PANEL BY RT-PCR (FLU A&B, COVID) ARPGX2  LIPASE, BLOOD  URINALYSIS, ROUTINE W REFLEX MICROSCOPIC  Radiology CT Head Wo Contrast  Result Date: 06/19/2021 CLINICAL DATA:  Headache. EXAM: CT HEAD WITHOUT CONTRAST TECHNIQUE: Contiguous axial images were  obtained from the base of the skull through the vertex without intravenous contrast. RADIATION DOSE REDUCTION: This exam was performed according to the departmental dose-optimization program which includes automated exposure control, adjustment of the mA and/or kV according to patient size and/or use of iterative reconstruction technique. COMPARISON:  Head CT dated 08/11/2018. FINDINGS: Brain: The ventricles and sulci appropriate size for patient's age. The gray-white matter discrimination is preserved. Small hypodense focus inferior to the right lentiform nucleus may represent an old lacunar infarct versus a dilated prevascular space. There is no acute intracranial hemorrhage. No mass effect or midline shift. No extra-axial fluid collection. Vascular: No hyperdense vessel or unexpected calcification. Skull: Normal. Negative for fracture or focal lesion. Sinuses/Orbits: No acute finding. Other: None IMPRESSION: No acute intracranial pathology. Electronically Signed   By: Anner Crete M.D.   On: 06/19/2021 19:54    Pertinent labs & imaging results that were available during my care of the patient were reviewed by me and considered in my medical decision making (see MDM for details).  Medications Ordered in ED Medications  ondansetron (ZOFRAN-ODT) disintegrating tablet 4 mg (4 mg Oral Given 06/19/21 1751)  prochlorperazine (COMPAZINE) injection 10 mg (10 mg Intravenous Given 06/19/21 2233)  lactated ringers bolus 1,000 mL (1,000 mLs Intravenous New Bag/Given 06/19/21 2237)  LORazepam (ATIVAN) injection 0.5 mg (0.5 mg Intravenous Given 06/19/21 2233)                                                                                                                                     Procedures Procedures  (including critical care time)  Medical Decision Making / ED Course   This patient presents to the ED for concern of headache, this involves an extensive number of treatment options, and is a  complaint that carries with it a high risk of complications and morbidity.  The differential diagnosis includes migraine headache, dehydration, intracranial mass, CVA  MDM: Patient seen emergency department for evaluation of a headache.  Physical exam is unremarkable with no focal motor or sensory deficits, no cranial nerve deficits.  Laboratory evaluation is unremarkable and he has no evidence of endorgan damage in the setting of his hypertension.  CT head unremarkable.  Patient was given headache cocktail with lactated Ringer's, Compazine and Ativan which led to complete resolution of his headache symptoms and improvement of his blood pressures.  Patient presentation consistent with migraine headache and he was discharged with outpatient PCP follow-up.   Additional history obtained: -Additional history obtained from wife -External records from outside source obtained and reviewed including: Chart review including previous notes, labs, imaging, consultation notes   Lab Tests: -I ordered, reviewed, and interpreted labs.   The pertinent results include:   Labs Reviewed  COMPREHENSIVE METABOLIC PANEL - Abnormal; Notable for the  following components:      Result Value   Glucose, Bld 105 (*)    All other components within normal limits  CBC - Abnormal; Notable for the following components:   RBC 4.06 (*)    All other components within normal limits  RESP PANEL BY RT-PCR (FLU A&B, COVID) ARPGX2  LIPASE, BLOOD  URINALYSIS, ROUTINE W REFLEX MICROSCOPIC      Imaging Studies ordered: I ordered imaging studies including CT head I independently visualized and interpreted imaging. I agree with the radiologist interpretation   Medicines ordered and prescription drug management: Meds ordered this encounter  Medications   ondansetron (ZOFRAN-ODT) disintegrating tablet 4 mg   prochlorperazine (COMPAZINE) injection 10 mg   DISCONTD: diphenhydrAMINE (BENADRYL) injection 25 mg   lactated ringers  bolus 1,000 mL   DISCONTD: ketorolac (TORADOL) 15 MG/ML injection 15 mg   LORazepam (ATIVAN) injection 0.5 mg    -I have reviewed the patients home medicines and have made adjustments as needed  Critical interventions None   Cardiac Monitoring: The patient was maintained on a cardiac monitor.  I personally viewed and interpreted the cardiac monitored which showed an underlying rhythm of: NSR  Social Determinants of Health:  Factors impacting patients care include: none   Reevaluation: After the interventions noted above, I reevaluated the patient and found that they have :improved  Co morbidities that complicate the patient evaluation  Past Medical History:  Diagnosis Date   Anxiety    Aortic valve insufficiency, senile calcific October 14   Moderate regurgitation, eccentric towards Ant MV Leaflet   Bence Jones proteinuria 09/01/2012   Borderline increase protein 190 mg on 24 hr urine IFE with free kappa light chains (lab normal 50-100 mg)  08/18/12   CAD in native artery    s/p CABG; -- Cards: Dr. Roni Bread, [facility]; [facility] 01/2013 EF 57%, No ischemia or Infarction.   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  Mildly dilated LV with normal systolic function (EF 62-22%), moderate LA dilation, upper normal PA pressures ~ 34 mmHg.   Monoclonal gammopathy 09/01/2012   Increased total protein, increased beta globulin peak with decreased  gammaglobulin peak on SPEP 08/18/12  IFE not done "possible faint abnormal band"   Multiple myeloma, without mention of having achieved remission 10/14/2012   Stage I Durie-Salmon; good prognosis international scoring system: see 10/13/12 progress note --> ? in Remission as of 06/19/2013; ? Failed Auto BMT; Continues to be on Chemo  with planned Donor BMT   Normochromic normocytic anemia 09/01/2012   Hb 12.7, MCV 95  08/18/12 was 13.7 1 year ago; concomitant leukopenia  & increased lymphs   Pneumococcal vaccine refused 08/15/2012      Dispostion: I considered admission for this patient, but his symptoms resolved entirely with a single headache cocktail.  He is safe for discharge at this time and does not meet inpatient criteria.     Final  Clinical Impression(s) / ED Diagnoses Final diagnoses:  Other migraine without status migrainosus, not intractable     _0 @    Teressa Lower, MD 06/19/21 2342

## 2021-06-19 NOTE — ED Notes (Signed)
EMS talked patient out of being transported to ED. Patient left the facility with his wife.

## 2021-06-19 NOTE — Discharge Instructions (Addendum)
Sent to hospital via EMS.

## 2021-06-19 NOTE — ED Triage Notes (Addendum)
Constant headache increasing over the last two weeks after a viral illness around new years. States yesterday it got so bad that he has been vomiting ever since. Saw his PCP Friday, was prescribed amoxicillin and tramadol, has been so sick that he hasn't been able to keep his medications down. Currently vomiting in triage. Patient is agreeable to going to the emergency department. Rates his pain 10/10 all over his head. Unable to stop vomiting green colored mucus throughout triage. Sensitive to light and sound.

## 2021-06-19 NOTE — Telephone Encounter (Signed)
Pt went to urgent care and got sent to ER and they are waiting for pt to get a Head scan done at ER now. Pt bp was over 200 and since its come down to 160 they do not rate him urgent anymore so they have been waiting.

## 2021-06-19 NOTE — ED Provider Triage Note (Signed)
Emergency Medicine Provider Triage Evaluation Note  [name] , a 61 y.o. male  was evaluated in triage.  Pt complains of 1 week of a severe headache localized to the forehead.  He reports that last night he began to feel nauseated and vomit profusely as well.  When asked about visual changes he states "a little."  No falls.  No history of stroke.  Is on Eliquis for previous DVT/PE.  No fevers or chills  Review of Systems  Per above  Physical Exam  BP (!) 189/86 (BP Location: Left Arm)    Pulse 67    Temp 98.1 F (36.7 C) (Oral)    Resp (!) 24    SpO2 99%  Gen:   Awake, no distress   Resp:  Normal effort  MSK:   Moves extremities without difficulty  Other:  Regular rate and rhythm, systolic murmur.  Lung sounds clear.  PERRLA, right side with slower reaction, history of surgery for glaucoma.  Ambulatory.  Medical Decision Making  Medically screening exam initiated at 6:05 PM.  Appropriate orders placed.  [name] was informed that the remainder of the evaluation will be completed by another provider, this initial triage assessment does not replace that evaluation, and the importance of remaining in the ED until their evaluation is complete.    Has been nauseous and vomiting for 1 to 2 days.  Unable to keep down his blood pressure medication.   Tyler Hammock, PA-C 06/19/21 1807

## 2021-06-19 NOTE — Telephone Encounter (Signed)
Pt. Wife called back stating he is still throwing up. She doesn't know how he has anything to throw up he has thrown up so much. I told her you have not had a chance to see her message yet and would probably be able to view it during lunch and see what that need to do next.

## 2021-06-19 NOTE — Telephone Encounter (Signed)
Looks like he went to the ED as of an hour ago.

## 2021-06-19 NOTE — Progress Notes (Signed)
zof

## 2021-06-19 NOTE — ED Provider Notes (Addendum)
[facility]    CSN: [id number] Arrival date & time: 06/19/21  1328      History   Chief Complaint Chief Complaint  Patient presents with   Headache   Emesis    HPI SHALOM MCGUINESS is a 61 y.o. male.   Patient presents with severe headache, nausea, vomiting that has been worsening over the past 2 weeks.  Patient was seen by PCP on 06/15/2021.  He was prescribed amoxicillin and Toradol due to suspicion of upper respiratory infection given that patient had had upper respiratory infection at the beginning of the year that was persistent.  Patient reports that those symptoms have resolved, and headache has been persistent.  Headache is rated 10/10 on pain scale and is located in the front of the head.  Patient is not able to eat or drink anything due to nausea and vomiting.  Denies dizziness but does endorse some blurred vision, although patient reports that this is baseline for him as he has glaucoma.   Headache Emesis  Past Medical History:  Diagnosis Date   Anxiety    Aortic valve insufficiency, senile calcific October 14   Moderate regurgitation, eccentric towards Ant MV Leaflet   Bence Jones proteinuria 09/01/2012   Borderline increase protein 190 mg on 24 hr urine IFE with free kappa light chains (lab normal 50-100 mg)  08/18/12   CAD in native artery    s/p CABG; -- Cards: Dr. Roni Bread, [facility]; [facility] 01/2013 EF 57%, No ischemia or Infarction.   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 Elisabeth Cara   Hyperlipidemia LDL goal <70    Leukopenia 09/01/2012   WBC 3,700 42 poly, 48 lymphs, 9 monos 08/18/12  Was 3,700 1 year ago   Mitral valve anterior leaflet prolapse Oct 2014   Echo - Severe anterior prolapse   Moderate mitral regurgitation by prior echocardiogram October 2014   Echo 02/2013:  Severe holosystolic Ant MV Leaflet prolapse, Moderate regurgitation.  Mildly dilated LV with normal systolic function (EF 25-63%), moderate LA dilation, upper normal PA pressures ~ 34 mmHg.   Monoclonal gammopathy 09/01/2012   Increased total protein, increased beta globulin peak with decreased gammaglobulin peak on SPEP 08/18/12  IFE not done "possible faint abnormal band"   Multiple myeloma, without mention of having achieved remission 10/14/2012   Stage I Durie-Salmon; good prognosis international scoring system: see 10/13/12 progress note --> ? in Remission as of 06/19/2013; ? 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[place]) 11/29/2016   Dyslipidemia, goal LDL below 70 04/03/2014   Aortic valve insufficiency, senile calcific    Multiple myeloma in remission ([place]) 06/19/2013   Moderate mitral regurgitation by prior echocardiogram 02/25/2013   Mitral valve anterior leaflet prolapse 02/25/2013   Hx of valvular heart disease - MVP with midl-mod MR, Mild AI with Aortic Sclerosis 02/16/2013   CAD in native artery -- s/p CABG 02/16/2013   Monoclonal gammopathy 09/01/2012   Normochromic  normocytic anemia 09/01/2012   Leukopenia 09/01/2012   Hx of CABG 08/02/2011   Hyperlipidemia LDL goal <70 08/02/2011   Allergic rhinitis, mild 08/02/2011   Arthralgia of hand 10/11/2010   Back pain 10/11/2010   Fatigue 10/11/2010   ED (erectile dysfunction) 10/11/2010    Past Surgical History:  Procedure Laterality Date   CARDIAC SURGERY     COLONOSCOPY     ?   CORONARY ARTERY BYPASS GRAFT  10/2003   LIMA to LAD, RIMA to OM1, sequential to OM2; Dr. Elisabeth Cara   MITRAL VALVE  SURGERY         Home Medications    Prior to Admission medications   Medication Sig Start Date End Date Taking? Authorizing Provider  acyclovir (ZOVIRAX) 400 MG tablet TAKE ONE TABLET BY MOUTH TWICE DAILY    Granfortuna, Alyson Locket, MD  albuterol (VENTOLIN HFA) 108 (90 Base) MCG/ACT inhaler Inhale 2 puffs into the lungs every 4 (four) hours as needed for wheezing or shortness of breath (or cough). 11/21/19   Tasia Catchings, Amy V, PA-C  amoxicillin (AMOXIL) 875 MG tablet Take 1 tablet (875 mg total) by mouth 2 (two) times daily for 10 days. 06/15/21 06/25/21  Tysinger, Camelia Eng, PA-C  Apixaban (ELIQUIS PO) Take 1 tablet by mouth 2 (two) times daily.    [provider]  cholecalciferol (VITAMIN D) 1000 units tablet Take 1,000 Units by mouth once a week.    [provider]  dorzolamide-timolol (COSOPT) 22.3-6.8 MG/ML ophthalmic solution 1 drop 2 (two) times daily.    [provider]  lisinopril (PRINIVIL,ZESTRIL) 2.5 MG tablet Take 2.5 mg by mouth daily.    [provider]  omeprazole (PRILOSEC) 40 MG capsule Take by mouth daily. 11/10/18   [provider]  ondansetron (ZOFRAN-ODT) 8 MG disintegrating tablet DISSOLVE 1 TABLET IN MOUTH EVERY 8 HOURS AS NEEDED FOR NAUSEA FOR UP TO 3 DAYS 06/19/21   Tysinger, Camelia Eng, PA-C  POSACONAZOLE PO Take by mouth.    [provider]  pregabalin (LYRICA) 100 MG capsule Take 100 mg by mouth 2 (two) times daily.    [provider]  Ruxolitinib Phosphate (JAKAFI PO) Take by mouth.    [provider]  sildenafil (REVATIO) 20 MG tablet Take 1 to 5 pills as needed 11/29/16   Denita Lung, MD  TACROLIMUS PO Take by mouth.    [provider]  traMADol (ULTRAM) 50 MG tablet Take 1 tablet (50 mg total) by mouth every 8 (eight) hours as needed for up to 5 days. 06/18/21 06/23/21  Denita Lung, MD  Vitamin D, Ergocalciferol, (DRISDOL) 1.25 MG (50000 UT) CAPS capsule Take by mouth. 11/06/18   [provider]  allopurinol (ZYLOPRIM) 100 MG tablet Take 100 mg by mouth daily.  11/21/19  [provider]  amLODipine (NORVASC) 5 MG tablet Take 5 mg by mouth daily.  11/21/19  [provider]  azelastine (ASTELIN) 0.1 % nasal spray Place 2 sprays into both nostrils 2 (two) times daily. Use in each nostril as directed Patient not taking: Reported on 09/14/2019 01/03/16 11/21/19  Denita Lung, MD  mometasone (NASONEX) 50 MCG/ACT nasal spray Place 2 sprays into the nose as needed. 09/25/12 11/21/19  Denita Lung, MD    Family History Family History  Problem Relation Age of Onset   Heart disease Father    Hypertension Father    Heart failure Father    Heart attack Brother  at 82   Heart attack Brother        at 79   Healthy Mother     Social History Social History   Tobacco Use   Smoking status: Never   Smokeless tobacco: Never  Vaping Use   Vaping Use: Never used  Substance Use Topics   Alcohol use: No   Drug use: No     Allergies   Atorvastatin, Chlorhexidine gluconate, and Toprol xl [metoprolol tartrate]   Review of Systems Review of Systems Per HPI  Physical Exam Triage Vital Signs ED Triage Vitals  Enc Vitals Group     BP 06/19/21 1503 (!) 222/94     Pulse Rate 06/19/21 1503 100     Resp 06/19/21 1503 16     Temp --      Temp src --      SpO2 06/19/21 1503 96 %     Weight --      Height --      Head Circumference --      Peak Flow --      Pain Score 06/19/21 1502 10     Pain Loc --      Pain Edu? --      Excl. in [place]? --    No data found.  Updated Vital Signs BP (!) 222/94 (BP Location: Right Arm)    Pulse 100    Resp 16    SpO2 96%   Visual Acuity Right Eye Distance:   Left Eye Distance:   Bilateral Distance:    Right Eye Near:   Left Eye Near:    Bilateral Near:     Physical Exam Constitutional:      General: He is not in acute distress.    Appearance: Normal appearance. He is ill-appearing and toxic-appearing.  He is not diaphoretic.  HENT:     Head: Normocephalic and atraumatic.     Right Ear: Tympanic membrane and ear canal normal.     Left Ear: Tympanic membrane and ear canal normal.     Nose: Nose normal.     Mouth/Throat:     Mouth: Mucous membranes are moist.     Pharynx: No posterior oropharyngeal erythema.  Eyes:     Extraocular Movements: Extraocular movements intact.     Conjunctiva/sclera: Conjunctivae normal.  Cardiovascular:     Rate and Rhythm: Normal rate and regular rhythm.     Pulses: Normal pulses.     Heart sounds: Normal heart sounds.  Pulmonary:     Effort: Pulmonary effort is normal. No respiratory distress.     Breath sounds: Normal breath sounds.  Skin:    Coloration: Skin is pale.  Neurological:     General: No focal deficit present.     Mental Status: He is alert and oriented to person, place, and time. Mental status is at baseline.     Cranial Nerves: Cranial nerves 2-12 are intact.     Sensory: Sensation is intact.     Motor: Motor function is intact.     Coordination: Coordination is intact.     Gait: Gait is intact.     Comments: Patient has irregular shaped pupil on the right side that is slightly larger than left pupil.  Patient reports that this is baseline due to his glaucoma.  Psychiatric:        Mood and Affect: Mood normal.        Behavior: Behavior normal.        Thought Content:  Thought content normal.        Judgment: Judgment normal.     UC Treatments / Results  Labs (all labs ordered are listed, but only abnormal results are displayed) Labs Reviewed - No data to display  EKG   Radiology No results found.  Procedures Procedures (including critical care time)  Medications Ordered in UC Medications - No data to display  Initial Impression / Assessment and Plan / UC Course  I have reviewed the triage vital signs and the nursing notes.  Pertinent labs & imaging results that were available during my care of the patient were  reviewed by me and considered in my medical decision making (see chart for details).     Due to significantly elevated blood pressure, the presence of severe headache, and severe nausea and vomiting, patient was advised that he would need to go to the hospital via EMS for further evaluation and management.  Patient declined EMS. Risks associated with not going to the hospital via EMS were discussed with patient. Patient voiced understanding. Patient left to go to hospital via his wife transporting him.  Final Clinical Impressions(s) / UC Diagnoses   Final diagnoses:  Hypertensive emergency  Severe headache  Nausea and vomiting, unspecified vomiting type     Discharge Instructions      Sent to hospital via EMS.    ED Prescriptions   None    PDMP not reviewed this encounter.   Teodora Medici, [place] 06/19/21 [place], [place], [place] 06/19/21 1545

## 2021-06-19 NOTE — Telephone Encounter (Signed)
Pt was seen by you on last Thursday. He called today and said he still has terrible headaches and he is now Vomiting. He said he cant take any of his medications because he cant keep anything down. Pt wants to see what his next step is.

## 2021-06-19 NOTE — ED Notes (Signed)
Called 911 for emergent patient transport to ED. Wife at bedside with patient at this time.

## 2021-06-19 NOTE — ED Triage Notes (Signed)
Pt sent here from UC for eval of severe headache for greater than one week. Endorses photophobia. Taking OTC meds without relief. Several episodes of vomiting over the last few days. Hx hypertension and has not taken lisinopril in two days d/t vomiting.

## 2021-06-20 ENCOUNTER — Telehealth: Payer: Self-pay | Admitting: Family Medicine

## 2021-06-20 NOTE — Telephone Encounter (Signed)
Left message for pt concerning recent er visit. Per AVS pt needs to follow up here.

## 2021-06-21 DIAGNOSIS — L988 Other specified disorders of the skin and subcutaneous tissue: Secondary | ICD-10-CM | POA: Diagnosis not present

## 2021-06-21 DIAGNOSIS — D89811 Chronic graft-versus-host disease: Secondary | ICD-10-CM | POA: Diagnosis not present

## 2021-06-21 DIAGNOSIS — C9001 Multiple myeloma in remission: Secondary | ICD-10-CM | POA: Diagnosis not present

## 2021-06-21 DIAGNOSIS — Z9481 Bone marrow transplant status: Secondary | ICD-10-CM | POA: Diagnosis not present

## 2021-06-21 DIAGNOSIS — Z9484 Stem cells transplant status: Secondary | ICD-10-CM | POA: Diagnosis not present

## 2021-06-21 DIAGNOSIS — D89813 Graft-versus-host disease, unspecified: Secondary | ICD-10-CM | POA: Diagnosis not present

## 2021-06-21 DIAGNOSIS — C9000 Multiple myeloma not having achieved remission: Secondary | ICD-10-CM | POA: Diagnosis not present

## 2021-06-24 ENCOUNTER — Other Ambulatory Visit: Payer: Self-pay | Admitting: Family Medicine

## 2021-06-24 MED ORDER — AMOXICILLIN-POT CLAVULANATE 875-125 MG PO TABS: 1.0000 | ORAL_TABLET | Freq: Two times a day (BID) | ORAL | 0 refills | Status: DC

## 2021-06-24 MED ORDER — TRAMADOL HCL 50 MG PO TABS: 50.0000 mg | ORAL_TABLET | Freq: Three times a day (TID) | ORAL | 0 refills | Status: AC | PRN

## 2021-06-25 ENCOUNTER — Observation Stay: Payer: BC Managed Care – PPO

## 2021-06-25 ENCOUNTER — Observation Stay
Admission: EM | Admit: 2021-06-25 | Discharge: 2021-06-27 | Disposition: A | Discharge status: Home / Self Care | Payer: BC Managed Care – PPO | Attending: Internal Medicine | Admitting: Internal Medicine

## 2021-06-25 ENCOUNTER — Encounter: Payer: Self-pay | Admitting: Emergency Medicine

## 2021-06-25 ENCOUNTER — Emergency Department: Payer: BC Managed Care – PPO

## 2021-06-25 ENCOUNTER — Other Ambulatory Visit: Payer: Self-pay

## 2021-06-25 DIAGNOSIS — R778 Other specified abnormalities of plasma proteins: Secondary | ICD-10-CM | POA: Insufficient documentation

## 2021-06-25 DIAGNOSIS — I619 Nontraumatic intracerebral hemorrhage, unspecified: Secondary | ICD-10-CM | POA: Diagnosis not present

## 2021-06-25 DIAGNOSIS — Z20822 Contact with and (suspected) exposure to covid-19: Secondary | ICD-10-CM | POA: Diagnosis not present

## 2021-06-25 DIAGNOSIS — G039 Meningitis, unspecified: Secondary | ICD-10-CM

## 2021-06-25 DIAGNOSIS — Z0389 Encounter for observation for other suspected diseases and conditions ruled out: Secondary | ICD-10-CM | POA: Diagnosis not present

## 2021-06-25 DIAGNOSIS — I2510 Atherosclerotic heart disease of native coronary artery without angina pectoris: Secondary | ICD-10-CM | POA: Diagnosis not present

## 2021-06-25 DIAGNOSIS — W57XXXA Bitten or stung by nonvenomous insect and other nonvenomous arthropods, initial encounter: Secondary | ICD-10-CM | POA: Diagnosis present

## 2021-06-25 DIAGNOSIS — I160 Hypertensive urgency: Principal | ICD-10-CM | POA: Diagnosis present

## 2021-06-25 DIAGNOSIS — I771 Stricture of artery: Secondary | ICD-10-CM | POA: Diagnosis not present

## 2021-06-25 DIAGNOSIS — Z79899 Other long term (current) drug therapy: Secondary | ICD-10-CM | POA: Insufficient documentation

## 2021-06-25 DIAGNOSIS — I639 Cerebral infarction, unspecified: Secondary | ICD-10-CM | POA: Diagnosis not present

## 2021-06-25 DIAGNOSIS — R519 Headache, unspecified: Secondary | ICD-10-CM | POA: Diagnosis not present

## 2021-06-25 DIAGNOSIS — E785 Hyperlipidemia, unspecified: Secondary | ICD-10-CM | POA: Diagnosis not present

## 2021-06-25 DIAGNOSIS — Z7901 Long term (current) use of anticoagulants: Secondary | ICD-10-CM | POA: Diagnosis not present

## 2021-06-25 DIAGNOSIS — G4485 Primary stabbing headache: Secondary | ICD-10-CM

## 2021-06-25 DIAGNOSIS — Z86718 Personal history of other venous thrombosis and embolism: Secondary | ICD-10-CM | POA: Diagnosis not present

## 2021-06-25 DIAGNOSIS — I6523 Occlusion and stenosis of bilateral carotid arteries: Secondary | ICD-10-CM | POA: Diagnosis not present

## 2021-06-25 DIAGNOSIS — C9001 Multiple myeloma in remission: Secondary | ICD-10-CM | POA: Diagnosis present

## 2021-06-25 DIAGNOSIS — Z951 Presence of aortocoronary bypass graft: Secondary | ICD-10-CM | POA: Diagnosis not present

## 2021-06-25 DIAGNOSIS — R079 Chest pain, unspecified: Secondary | ICD-10-CM | POA: Insufficient documentation

## 2021-06-25 DIAGNOSIS — I672 Cerebral atherosclerosis: Secondary | ICD-10-CM | POA: Diagnosis not present

## 2021-06-25 DIAGNOSIS — I82409 Acute embolism and thrombosis of unspecified deep veins of unspecified lower extremity: Secondary | ICD-10-CM | POA: Diagnosis present

## 2021-06-25 DIAGNOSIS — I10 Essential (primary) hypertension: Secondary | ICD-10-CM | POA: Insufficient documentation

## 2021-06-25 DIAGNOSIS — R0789 Other chest pain: Secondary | ICD-10-CM | POA: Diagnosis not present

## 2021-06-25 DIAGNOSIS — I2699 Other pulmonary embolism without acute cor pulmonale: Secondary | ICD-10-CM | POA: Diagnosis present

## 2021-06-25 DIAGNOSIS — I351 Nonrheumatic aortic (valve) insufficiency: Secondary | ICD-10-CM | POA: Diagnosis not present

## 2021-06-25 DIAGNOSIS — Z8679 Personal history of other diseases of the circulatory system: Secondary | ICD-10-CM

## 2021-06-25 LAB — URINE DRUG SCREEN, QUALITATIVE (ARMC ONLY)
Amphetamines, Ur Screen: NOT DETECTED
Barbiturates, Ur Screen: NOT DETECTED
Benzodiazepine, Ur Scrn: NOT DETECTED
Cannabinoid 50 Ng, Ur ~~LOC~~: NOT DETECTED
Cocaine Metabolite,Ur ~~LOC~~: NOT DETECTED
MDMA (Ecstasy)Ur Screen: NOT DETECTED
Methadone Scn, Ur: NOT DETECTED
Opiate, Ur Screen: NOT DETECTED
Phencyclidine (PCP) Ur S: NOT DETECTED
Tricyclic, Ur Screen: NOT DETECTED

## 2021-06-25 LAB — URINALYSIS, ROUTINE W REFLEX MICROSCOPIC
Bilirubin Urine: NEGATIVE
Glucose, UA: NEGATIVE mg/dL
Ketones, ur: NEGATIVE mg/dL
Leukocytes,Ua: NEGATIVE
Nitrite: NEGATIVE
Protein, ur: 30 mg/dL — AB
Specific Gravity, Urine: 1.015 (ref 1.005–1.030)
pH: 6.5 (ref 5.0–8.0)

## 2021-06-25 LAB — HEPATIC FUNCTION PANEL
ALT: 25 U/L (ref 0–44)
AST: 29 U/L (ref 15–41)
Albumin: 4.9 g/dL (ref 3.5–5.0)
Alkaline Phosphatase: 36 U/L — ABNORMAL LOW (ref 38–126)
Bilirubin, Direct: 0.2 mg/dL (ref 0.0–0.2)
Indirect Bilirubin: 1 mg/dL — ABNORMAL HIGH (ref 0.3–0.9)
Total Bilirubin: 1.2 mg/dL (ref 0.3–1.2)
Total Protein: 7.9 g/dL (ref 6.5–8.1)

## 2021-06-25 LAB — TROPONIN I (HIGH SENSITIVITY)
Troponin I (High Sensitivity): 53 ng/L — ABNORMAL HIGH (ref <18)
Troponin I (High Sensitivity): 47 ng/L — ABNORMAL HIGH (ref <18)
Troponin I (High Sensitivity): 36 ng/L — ABNORMAL HIGH (ref <18)
Troponin I (High Sensitivity): 43 ng/L — ABNORMAL HIGH (ref <18)
Troponin I (High Sensitivity): 53 ng/L — ABNORMAL HIGH (ref <18)

## 2021-06-25 LAB — CBC
HCT: 39.3 % (ref 39.0–52.0)
Hemoglobin: 12.8 g/dL — ABNORMAL LOW (ref 13.0–17.0)
MCH: 32.6 pg (ref 26.0–34.0)
MCHC: 32.6 g/dL (ref 30.0–36.0)
MCV: 100 fL (ref 80.0–100.0)
Platelets: 217 10*3/uL (ref 150–400)
RBC: 3.93 MIL/uL — ABNORMAL LOW (ref 4.22–5.81)
RDW: 14.4 % (ref 11.5–15.5)
WBC: 5.3 10*3/uL (ref 4.0–10.5)
nRBC: 0 % (ref 0.0–0.2)

## 2021-06-25 LAB — BASIC METABOLIC PANEL
Anion gap: 8 (ref 5–15)
BUN: 16 mg/dL (ref 8–23)
CO2: 26 mmol/L (ref 22–32)
Calcium: 9.8 mg/dL (ref 8.9–10.3)
Chloride: 101 mmol/L (ref 98–111)
Creatinine, Ser: 1.1 mg/dL (ref 0.61–1.24)
GFR, Estimated: >60 mL/min (ref >60)
Glucose, Bld: 123 mg/dL — ABNORMAL HIGH (ref 70–99)
Potassium: 4.8 mmol/L (ref 3.5–5.1)
Sodium: 135 mmol/L (ref 135–145)

## 2021-06-25 LAB — RESP PANEL BY RT-PCR (FLU A&B, COVID) ARPGX2
Influenza A by PCR: NEGATIVE
Influenza B by PCR: NEGATIVE
SARS Coronavirus 2 by RT PCR: NEGATIVE

## 2021-06-25 LAB — LIPASE, BLOOD: Lipase: 29 U/L (ref 11–51)

## 2021-06-25 LAB — URINALYSIS, MICROSCOPIC (REFLEX): Squamous Epithelial / HPF: NONE SEEN (ref 0–5)

## 2021-06-25 LAB — HIV ANTIBODY (ROUTINE TESTING W REFLEX): HIV Screen 4th Generation wRfx: NONREACTIVE

## 2021-06-25 MED ORDER — IOHEXOL 350 MG/ML SOLN
75.0000 mL | Freq: Once | INTRAVENOUS | Status: AC | PRN
  Administered 2021-06-25: 75 mL via INTRAVENOUS

## 2021-06-25 MED ORDER — LACTATED RINGERS IV BOLUS
1000.0000 mL | Freq: Once | INTRAVENOUS | Status: AC
  Administered 2021-06-25: 1000 mL via INTRAVENOUS

## 2021-06-25 MED ORDER — LORAZEPAM 2 MG/ML IJ SOLN
1.0000 mg | Freq: Once | INTRAMUSCULAR | Status: AC
Start: 2021-06-25 — End: 2021-06-25
  Administered 2021-06-25: 1 mg via INTRAVENOUS
  Filled 2021-06-25: qty 1

## 2021-06-25 MED ORDER — DORZOLAMIDE HCL-TIMOLOL MAL PF 2-0.5 % OP SOLN: 1.0000 [drp] | Freq: Two times a day (BID) | OPHTHALMIC | Status: DC

## 2021-06-25 MED ORDER — DOXYCYCLINE HYCLATE 100 MG PO TABS
200.0000 mg | ORAL_TABLET | Freq: Once | ORAL | Status: AC
  Administered 2021-06-25: 200 mg via ORAL
  Filled 2021-06-25: qty 2

## 2021-06-25 MED ORDER — HYDRALAZINE HCL 20 MG/ML IJ SOLN
10.0000 mg | INTRAMUSCULAR | Status: DC | PRN
  Administered 2021-06-25: 10 mg via INTRAVENOUS
  Filled 2021-06-25: qty 1

## 2021-06-25 MED ORDER — IOHEXOL 350 MG/ML SOLN
80.0000 mL | Freq: Once | INTRAVENOUS | Status: AC | PRN
  Administered 2021-06-25: 80 mL via INTRAVENOUS

## 2021-06-25 MED ORDER — LORAZEPAM 0.5 MG PO TABS
0.5000 mg | ORAL_TABLET | Freq: Three times a day (TID) | ORAL | Status: DC | PRN
  Administered 2021-06-25: 0.5 mg via ORAL
  Filled 2021-06-25: qty 1

## 2021-06-25 MED ORDER — POSACONAZOLE 100 MG PO TBEC
300.0000 mg | DELAYED_RELEASE_TABLET | Freq: Every day | ORAL | Status: DC
  Administered 2021-06-26 – 2021-06-27 (×2): 300 mg via ORAL
  Filled 2021-06-25 (×3): qty 3

## 2021-06-25 MED ORDER — LORAZEPAM 2 MG/ML IJ SOLN
2.0000 mg | Freq: Once | INTRAMUSCULAR | Status: AC
  Administered 2021-06-25: 2 mg via INTRAVENOUS
  Filled 2021-06-25: qty 1

## 2021-06-25 MED ORDER — LISINOPRIL 20 MG PO TABS
20.0000 mg | ORAL_TABLET | Freq: Two times a day (BID) | ORAL | Status: DC
  Administered 2021-06-25 – 2021-06-27 (×4): 20 mg via ORAL
  Filled 2021-06-25 (×4): qty 1

## 2021-06-25 MED ORDER — ASPIRIN 81 MG PO CHEW
324.0000 mg | CHEWABLE_TABLET | Freq: Once | ORAL | Status: AC
Start: 2021-06-25 — End: 2021-06-25
  Administered 2021-06-25: 324 mg via ORAL
  Filled 2021-06-25: qty 4

## 2021-06-25 MED ORDER — HYDRALAZINE HCL 25 MG PO TABS
25.0000 mg | ORAL_TABLET | Freq: Three times a day (TID) | ORAL | Status: DC
  Administered 2021-06-25 – 2021-06-27 (×6): 25 mg via ORAL
  Filled 2021-06-25 (×6): qty 1

## 2021-06-25 MED ORDER — ALBUTEROL SULFATE (2.5 MG/3ML) 0.083% IN NEBU: 2.5000 mg | INHALATION_SOLUTION | RESPIRATORY_TRACT | Status: DC | PRN

## 2021-06-25 MED ORDER — ALLOPURINOL 100 MG PO TABS
100.0000 mg | ORAL_TABLET | Freq: Every day | ORAL | Status: DC
Start: 2021-06-25 — End: 2021-06-27
  Administered 2021-06-25 – 2021-06-27 (×3): 100 mg via ORAL
  Filled 2021-06-25 (×4): qty 1

## 2021-06-25 MED ORDER — RUXOLITINIB PHOSPHATE 10 MG PO TABS
10.0000 mg | ORAL_TABLET | Freq: Two times a day (BID) | ORAL | Status: DC
  Administered 2021-06-25: 10 mg via ORAL

## 2021-06-25 MED ORDER — AMLODIPINE BESYLATE 10 MG PO TABS
10.0000 mg | ORAL_TABLET | Freq: Every day | ORAL | Status: DC
  Administered 2021-06-25 – 2021-06-27 (×3): 10 mg via ORAL
  Filled 2021-06-25 (×2): qty 1
  Filled 2021-06-25: qty 2

## 2021-06-25 MED ORDER — PANTOPRAZOLE SODIUM 40 MG PO TBEC
40.0000 mg | DELAYED_RELEASE_TABLET | Freq: Every day | ORAL | Status: DC
  Administered 2021-06-25 – 2021-06-27 (×3): 40 mg via ORAL
  Filled 2021-06-25 (×3): qty 1

## 2021-06-25 MED ORDER — SODIUM CHLORIDE 0.9 % IV SOLN
12.5000 mg | Freq: Once | INTRAVENOUS | Status: AC
  Administered 2021-06-25: 12.5 mg via INTRAVENOUS
  Filled 2021-06-25: qty 12.5

## 2021-06-25 MED ORDER — ASPIRIN EC 81 MG PO TBEC
81.0000 mg | DELAYED_RELEASE_TABLET | Freq: Every day | ORAL | Status: DC
  Administered 2021-06-26 – 2021-06-27 (×2): 81 mg via ORAL
  Filled 2021-06-25 (×2): qty 1

## 2021-06-25 MED ORDER — ROSUVASTATIN CALCIUM 10 MG PO TABS: 10.0000 mg | ORAL_TABLET | Freq: Every day | ORAL | Status: DC

## 2021-06-25 MED ORDER — ACETAMINOPHEN 325 MG PO TABS: 650.0000 mg | ORAL_TABLET | Freq: Four times a day (QID) | ORAL | Status: DC | PRN

## 2021-06-25 MED ORDER — NITROGLYCERIN 0.4 MG SL SUBL: 0.4000 mg | SUBLINGUAL_TABLET | SUBLINGUAL | Status: DC | PRN

## 2021-06-25 MED ORDER — RUXOLITINIB PHOSPHATE 10 MG PO TABS
10.0000 mg | ORAL_TABLET | Freq: Two times a day (BID) | ORAL | Status: DC
  Administered 2021-06-26 – 2021-06-27 (×3): 10 mg via ORAL
  Filled 2021-06-25 (×3): qty 1

## 2021-06-25 MED ORDER — ONDANSETRON HCL 4 MG/2ML IJ SOLN: 4.0000 mg | Freq: Three times a day (TID) | INTRAMUSCULAR | Status: DC | PRN

## 2021-06-25 MED ORDER — MORPHINE SULFATE (PF) 2 MG/ML IV SOLN: 2.0000 mg | INTRAVENOUS | Status: DC | PRN

## 2021-06-25 MED ORDER — ACYCLOVIR 200 MG PO CAPS
400.0000 mg | ORAL_CAPSULE | Freq: Two times a day (BID) | ORAL | Status: DC
  Administered 2021-06-25 – 2021-06-27 (×5): 400 mg via ORAL
  Filled 2021-06-25 (×7): qty 2

## 2021-06-25 MED ORDER — HYDRALAZINE HCL 20 MG/ML IJ SOLN
5.0000 mg | Freq: Once | INTRAMUSCULAR | Status: AC
Start: 2021-06-25 — End: 2021-06-25
  Administered 2021-06-25: 5 mg via INTRAVENOUS
  Filled 2021-06-25: qty 1

## 2021-06-25 MED ORDER — APIXABAN 2.5 MG PO TABS
2.5000 mg | ORAL_TABLET | Freq: Two times a day (BID) | ORAL | Status: DC
  Administered 2021-06-25 (×2): 2.5 mg via ORAL
  Filled 2021-06-25 (×3): qty 1

## 2021-06-25 MED ORDER — TRAMADOL HCL 50 MG PO TABS
50.0000 mg | ORAL_TABLET | Freq: Three times a day (TID) | ORAL | Status: DC | PRN
  Administered 2021-06-25: 50 mg via ORAL
  Filled 2021-06-25: qty 1

## 2021-06-25 MED ORDER — LISINOPRIL 10 MG PO TABS
20.0000 mg | ORAL_TABLET | Freq: Every day | ORAL | Status: DC
  Administered 2021-06-25: 20 mg via ORAL
  Filled 2021-06-25: qty 2

## 2021-06-25 MED ORDER — TIMOLOL MALEATE 0.5 % OP SOLN
1.0000 [drp] | Freq: Two times a day (BID) | OPHTHALMIC | Status: DC
  Administered 2021-06-25 – 2021-06-27 (×3): 1 [drp] via OPHTHALMIC
  Filled 2021-06-25 (×2): qty 5

## 2021-06-25 MED ORDER — GADOBUTROL 1 MMOL/ML IV SOLN
7.0000 mL | Freq: Once | INTRAVENOUS | Status: AC | PRN
  Administered 2021-06-25: 7.5 mL via INTRAVENOUS

## 2021-06-25 MED ORDER — MAGNESIUM OXIDE 400 MG PO TABS
400.0000 mg | ORAL_TABLET | Freq: Two times a day (BID) | ORAL | Status: DC
Start: 2021-06-25 — End: 2021-06-27
  Administered 2021-06-25 – 2021-06-27 (×5): 400 mg via ORAL
  Filled 2021-06-25 (×11): qty 1

## 2021-06-25 MED ORDER — SUMATRIPTAN SUCCINATE 50 MG PO TABS: 50.0000 mg | ORAL_TABLET | ORAL | Status: DC | PRN

## 2021-06-25 MED ORDER — TACROLIMUS 0.5 MG PO CAPS
0.5000 mg | ORAL_CAPSULE | Freq: Every day | ORAL | Status: DC
  Administered 2021-06-25 – 2021-06-27 (×3): 0.5 mg via ORAL
  Filled 2021-06-25 (×3): qty 1

## 2021-06-25 MED ORDER — PROCHLORPERAZINE EDISYLATE 10 MG/2ML IJ SOLN
10.0000 mg | Freq: Once | INTRAMUSCULAR | Status: AC
  Administered 2021-06-25: 10 mg via INTRAVENOUS
  Filled 2021-06-25: qty 2

## 2021-06-25 MED ORDER — ALBUTEROL SULFATE HFA 108 (90 BASE) MCG/ACT IN AERS: 2.0000 | INHALATION_SPRAY | RESPIRATORY_TRACT | Status: DC | PRN

## 2021-06-25 MED ORDER — DORZOLAMIDE HCL 2 % OP SOLN
1.0000 [drp] | Freq: Two times a day (BID) | OPHTHALMIC | Status: DC
  Administered 2021-06-25 – 2021-06-27 (×3): 1 [drp] via OPHTHALMIC
  Filled 2021-06-25 (×2): qty 10

## 2021-06-25 MED ORDER — THIAMINE HCL 100 MG PO TABS
100.0000 mg | ORAL_TABLET | Freq: Every day | ORAL | Status: DC
Start: 2021-06-25 — End: 2021-06-27
  Administered 2021-06-25 – 2021-06-27 (×3): 100 mg via ORAL
  Filled 2021-06-25 (×3): qty 1

## 2021-06-25 MED ORDER — HYDRALAZINE HCL 20 MG/ML IJ SOLN: 5.0000 mg | INTRAMUSCULAR | Status: DC | PRN

## 2021-06-25 NOTE — Consult Note (Signed)
CARDIOLOGY CONSULT NOTE               Patient ID: [name] MRN: [id number] DOB/AGE: 1960-06-10 61 y.o.  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  No evidence of CVA headache is been frontal he recently had a viral illness and now resulted in headaches is tried Imitrex with no significant relief.  Patient blood pressures been elevated now presented for further evaluation troponins were negative blood pressure was as high as close to 200.  Review of systems complete and found to be negative unless listed above     Past Medical History:  Diagnosis Date   Anxiety    Aortic valve insufficiency, senile calcific October 14   Moderate regurgitation, eccentric towards Ant MV Leaflet   Bence Jones proteinuria 09/01/2012   Borderline increase protein 190 mg on 24 hr urine IFE with free kappa light chains (lab normal 50-100 mg)  08/18/12   CAD in native artery    s/p CABG; -- Cards: Dr. Roni Bread, [facility]; [facility] 01/2013 EF 57%, No ischemia or Infarction.   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  Mildly dilated LV with normal systolic function (EF 09-38%), moderate LA dilation, upper normal PA pressures ~ 34 mmHg.   Monoclonal gammopathy 09/01/2012   Increased total protein, increased beta globulin peak with decreased gammaglobulin peak on SPEP 08/18/12  IFE not done "possible faint abnormal band"   Multiple myeloma, without mention of having achieved remission 10/14/2012   Stage I Durie-Salmon; good prognosis international scoring system: see 10/13/12 progress note --> ? in Remission as of 06/19/2013; ? Failed Auto BMT; Continues to be on Chemo  with planned Donor BMT   Normochromic normocytic anemia 09/01/2012   Hb 12.7, MCV 95  08/18/12 was 13.7 1 year ago; concomitant leukopenia  & increased lymphs   Pneumococcal vaccine refused 08/15/2012    Past Surgical History:  Procedure Laterality Date   CARDIAC SURGERY     COLONOSCOPY     ?   CORONARY ARTERY BYPASS GRAFT  10/2003   LIMA to LAD, RIMA to OM1, sequential to OM2; Dr. Elisabeth Cara   MITRAL VALVE SURGERY      (Not in a hospital admission)  Social History   Socioeconomic History   Marital status: Married    Spouse name: Not on file   Number of children: 3   Years of education: Not on file   Highest education level: Not on  file  Occupational History   Occupation: owns IT sales professional: [employer]  Tobacco Use   Smoking status: Never   Smokeless tobacco: Never  Vaping Use   Vaping Use: Never used  Substance and Sexual Activity   Alcohol use: No   Drug use: No   Sexual activity: Not on file  Other Topics Concern   Not on file  Social History Narrative   Not on file   Social Determinants of Health   Financial Resource Strain: Not on file  Food Insecurity:  Not on file  Transportation Needs: Not on file  Physical Activity: Not on file  Stress: Not on file  Social Connections: Not on file  Intimate Partner Violence: Not on file    Family History  Problem Relation Age of Onset   Heart disease Father    Hypertension Father    Heart failure Father    Heart attack Brother        at 15   Heart attack Brother        at 36   Healthy Mother       Review of systems complete and found to be negative unless listed above      PHYSICAL EXAM  General: Well developed, well nourished, in no acute distress HEENT:  Normocephalic and atramatic Neck:  No JVD.  Lungs: Clear bilaterally to auscultation and percussion. Heart: HRRR . Normal S1 and S2 without gallops or murmurs.  Abdomen: Bowel sounds are positive, abdomen soft and non-tender  Msk:  Back normal, normal gait. Normal strength and tone for age. Extremities: No clubbing, cyanosis or edema.   Neuro: Alert and oriented X 3. Psych:  Good affect, responds appropriately  Labs:   Lab Results  Component Value Date   WBC 5.3 06/25/2021   HGB 12.8 (L) 06/25/2021   HCT 39.3 06/25/2021   MCV 100.0 06/25/2021   PLT 217 06/25/2021    Recent Labs  Lab 06/25/21 0128  NA 135  K 4.8  CL 101  CO2 26  BUN 16  CREATININE 1.10  CALCIUM 9.8  PROT 7.9  BILITOT 1.2  ALKPHOS 36*  ALT 25  AST 29  GLUCOSE 123*   Lab Results  Component Value Date   CKTOTAL 89 10/11/2010   No results found for: CHOL No results found for: HDL No results found for: LDLCALC No results found for: TRIG No results found for: CHOLHDL No results found for: LDLDIRECT    Radiology: CT ANGIO HEAD NECK W WO CM  Result Date: 06/25/2021 CLINICAL DATA:  Sudden and severe headache for over 1 week. Elevated blood pressure EXAM: CT ANGIOGRAPHY HEAD AND NECK TECHNIQUE: Multidetector CT imaging of the head and neck was performed using the standard protocol during bolus administration of intravenous contrast. Multiplanar  CT image reconstructions and MIPs were obtained to evaluate the vascular anatomy. Carotid stenosis measurements (when applicable) are obtained utilizing NASCET criteria, using the distal internal carotid diameter as the denominator. RADIATION DOSE REDUCTION: This exam was performed according to the departmental dose-optimization program which includes automated exposure control, adjustment of the mA and/or kV according to patient size and/or use of iterative reconstruction technique. CONTRAST:  70m OMNIPAQUE IOHEXOL 350 MG/ML SOLN COMPARISON:  Head CT from 6 days ago FINDINGS: CT HEAD FINDINGS Brain: No evidence of acute infarction, hemorrhage, hydrocephalus, extra-axial collection or mass lesion/mass effect. Vascular: See below Skull: Unremarkable Sinuses: No sinusitis Orbits: Unremarkable Review of the MIP images confirms the above findings  CTA NECK FINDINGS Aortic arch: 3 vessel branching. No acute finding. Partially covered CABG with enhancing LIMA and venous grafts. Right carotid system: Mild plaque at the bifurcation. No stenosis, beading, or dissection Left carotid system: Mild plaque at the bifurcation. ICA tortuosity with looping. No beading or dissection Vertebral arteries: No proximal subclavian stenosis. Calcified plaque at the right vertebral origin more than left vertebral origin. The left vertebral artery is mildly dominant. No stenosis, beading, or dissection. Skeleton: Unremarkable Other neck: No acute finding Upper chest: Negative Review of the MIP images confirms the above findings CTA HEAD FINDINGS Anterior circulation: No significant stenosis, proximal occlusion, aneurysm, or vascular malformation. Posterior circulation: Mild atheromatous plaque at the right V4 segment. Vertebral and basilar arteries are smoothly contoured and widely patent. No branch occlusion, beading, or aneurysm. Venous sinuses: Unremarkable Anatomic variants: Fetal type right PCA Review of the MIP images confirms the above  findings IMPRESSION: 1. No emergent finding or explanation for headache. 2. Mild atherosclerosis. Electronically Signed   By: Jorje Guild M.D.   On: 06/25/2021 06:31   CT Head Wo Contrast  Result Date: 06/19/2021 CLINICAL DATA:  Headache. EXAM: CT HEAD WITHOUT CONTRAST TECHNIQUE: Contiguous axial images were obtained from the base of the skull through the vertex without intravenous contrast. RADIATION DOSE REDUCTION: This exam was performed according to the departmental dose-optimization program which includes automated exposure control, adjustment of the mA and/or kV according to patient size and/or use of iterative reconstruction technique. COMPARISON:  Head CT dated 08/11/2018. FINDINGS: Brain: The ventricles and sulci appropriate size for patient's age. The gray-white matter discrimination is preserved. Small hypodense focus inferior to the right lentiform nucleus may represent an old lacunar infarct versus a dilated prevascular space. There is no acute intracranial hemorrhage. No mass effect or midline shift. No extra-axial fluid collection. Vascular: No hyperdense vessel or unexpected calcification. Skull: Normal. Negative for fracture or focal lesion. Sinuses/Orbits: No acute finding. Other: None IMPRESSION: No acute intracranial pathology. Electronically Signed   By: Anner Crete M.D.   On: 06/19/2021 19:54    EKG: Normal sinus rhythm rate around 60  ASSESSMENT AND PLAN:  Hypertensive urgency Headaches Multivessel coronary disease Mitral valve disease status post mitral valve clip Multiple myeloma Generalized weakness Bradycardia DVT PE S/P  bone marrow transplant . Plan Increased hypertensive meds add hydralazine p.o. and increase lisinopril 20 mg twice a day Continue amlodipine for blood pressure management Consider adding HCTZ maintain diuretic therapy for shortness of breath dyspnea Inhalers as necessary for shortness of breath Pain control for headaches may be related to  blood pressure Continue Eliquis for anticoagulation    Signed: Yolonda Kida MD 06/25/2021, 10:11 AM

## 2021-06-25 NOTE — ED Notes (Signed)
Pt taken for MRI.

## 2021-06-25 NOTE — Assessment & Plan Note (Signed)
See A & P under CAD

## 2021-06-25 NOTE — Assessment & Plan Note (Signed)
Etiology is not clear.  Hypertensive urgency may have contributed partially..  Patient was given 1 mg of Ativan and Compazine 10 mg and IV fluid some improvement in ED.  Patient does not have focal neurodeficit.  CT head and CT angiogram of head and neck are negative for acute issues to explain his headache.  Consulted Dr. Cheral Marker -Follow-up with Dr. Yvetta Coder recommendations -Cannot give prn sumatriptan due to hypertensive urgency

## 2021-06-25 NOTE — ED Triage Notes (Addendum)
PT arrived via POV with reports of continued headache for over 1 week, pt seen at [facility] for headaches, pt also reports elevated BP due to not being able to keep meds down due to vomiting.  Pt states the headache has persisted and does not go away even with the prescribed rescue meds.   Wife states they found a tick a few weeks ago on pt, but pt states he had the headache prior to finding the tick.

## 2021-06-25 NOTE — H&P (Signed)
History and Physical    Patient: [name] MOQ:[id number] DOB: 08-21-1960 DOA: 06/25/2021 DOS: the patient was seen and examined on 06/25/2021 PCP: Denita Lung, MD  Patient coming from: Home  Chief Complaint: headache, elevated blood pressure  HPI: [name] is a 61 y.o. 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  Patient states that he has headache which has been going on for more than 2 weeks.  The headache is located in the front head, severe, intermittent, nonradiating.  No unilateral numbness or tinglings in extremities, no facial droop or slurred speech.  Patient was seen in in ED of [facility] on 06/19/2021 for the same and had a negative head CT. Pt was treated with Ativan, Compazine and IV fluids with improvement, but his headache has never completely resolved.  He still has intermittent headache. He took Imitrex yesterday evening at home with no relief in his pain.   Patient states that his blood pressure has been poorly controlled recently.  He is normally taking 10 mg lisinopril, blood pressure has been high, as high as 230 at home.  His cardiologist at the [facility] increased lisinopril to 20 mg yesterday, without significant improvement.  He had a 1 episode of chest pressure yesterday, which has resolved.  Currently patient does not have chest pain, cough, fever or chills.  He has had some shortness of breath. Patient denies nausea, vomiting, diarrhea or abdominal pain.  No symptoms of UTI.  No fever or chills.  He states that he had tick bite to his belly 2 weeks ago.  Patient states that his PCP started  Augmentin for him on 1/28 due to suspected sinusitis.  Data review and ED course: I have personally reviewed labs and imaging studies.  WBC 5.3, troponin level 53 -->47, GFR  >60, lipase 29, liver function normal, temperature normal, blood pressure 194/77, heart rate 64, RR 24, oxygen saturation 99% on room air.  CT of the head and CT angiogram of head and neck negative for acute findings to explain his headache.  Patient is placed on progressive bed for observation.  Dr. Cheral Marker of neurology and Dr. Clayborn Bigness of cardiology are consulted  EKG: Reviewed KEG independently.  Sinus rhythm, QTC 451, LAD, LAE, poor R wave progression, T wave inversion in V4-V6, and lateral leads.  Review of Systems:   General: no fevers, chills, no body weight gain, has fatigue HEENT: no blurry vision, hearing changes or sore throat. Has severe headache Respiratory: has dyspnea, no coughing, wheezing CV: no chest pain, no palpitations GI: no nausea, vomiting, abdominal pain, diarrhea, constipation GU: no dysuria, burning on urination, increased urinary frequency, hematuria  Ext: no leg edema Neuro: no unilateral weakness, numbness, or tingling, no vision change or hearing loss Skin: no rash, no skin tear. Has tick bite mark in his abdominal wall MSK: No muscle spasm, no deformity, no limitation of range of movement in spin Heme: No easy bruising.  Travel history: No recent long distant travel.   Past Medical History:  Diagnosis Date   Anxiety    Aortic valve insufficiency, senile calcific October 14   Moderate regurgitation, eccentric towards Ant MV Leaflet   Bence  Jones proteinuria 09/01/2012   Borderline increase protein 190 mg on 24 hr urine IFE with free kappa light chains (lab normal 50-100 mg)  08/18/12   CAD in native artery    s/p CABG; -- Cards: Dr. Roni Bread, [facility]; [facility] 01/2013 EF 57%, No ischemia or Infarction.   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  Mildly dilated LV with normal systolic function (EF 02-40%), moderate LA dilation, upper normal PA pressures ~ 34 mmHg.   Monoclonal gammopathy 09/01/2012   Increased total protein, increased beta globulin peak with decreased gammaglobulin peak on SPEP 08/18/12  IFE not done "possible faint abnormal band"   Multiple myeloma, without mention of having achieved remission 10/14/2012   Stage I Durie-[place]mon; good prognosis international scoring system: see 10/13/12 progress note --> ? in Remission as of 06/19/2013; ? Failed Auto BMT; Continues to be on Chemo  with planned Donor BMT   Normochromic normocytic anemia 09/01/2012   Hb 12.7, MCV 95  08/18/12 was 13.7 1 year ago; concomitant leukopenia  & increased lymphs   Pneumococcal vaccine refused 08/15/2012   Past Surgical History:  Procedure Laterality Date   CARDIAC SURGERY     COLONOSCOPY     ?   CORONARY ARTERY BYPASS GRAFT  10/2003   LIMA to LAD, RIMA to OM1, sequential to OM2; Dr. Elisabeth Cara   MITRAL VALVE SURGERY     Social History:  reports that he has never smoked. He has never used smokeless tobacco. He reports that he does not drink alcohol and does not use drugs.  Allergies  Allergen Reactions   Atorvastatin Other (See Comments)   Chlorhexidine Gluconate Other (See Comments) and Rash    Burning sensation and redness   Toprol Xl [Metoprolol Tartrate]     Intolerance-  Made him tired    Family History  Problem Relation Age of Onset    Heart disease Father    Hypertension Father    Heart failure Father    Heart attack Brother        at 51   Heart attack Brother        at 40   Healthy Mother     Prior to Admission medications   Medication Sig Start Date End Date Taking? Authorizing Provider  acyclovir (ZOVIRAX) 400 MG tablet TAKE ONE TABLET BY MOUTH TWICE DAILY    Granfortuna, Alyson Locket, MD  albuterol (VENTOLIN HFA) 108 (90 Base) MCG/ACT inhaler Inhale 2 puffs into the lungs every 4 (four) hours as needed for wheezing or shortness of breath (or cough). 11/21/19   Tasia Catchings, Amy V, PA-C  allopurinol (ZYLOPRIM) 100 MG tablet Take 100 mg by mouth daily. 06/08/21   [provider]  amoxicillin-clavulanate (AUGMENTIN) 875-125 MG tablet Take 1 tablet by mouth 2 (two) times daily. 06/24/21   Denita Lung, MD  apixaban (ELIQUIS) 2.5 MG TABS tablet Take 2.5 mg by mouth every 12 (twelve)  hours.    [provider]  Dorzolamide HCl-Timolol Mal PF 2-0.5 % SOLN Place 1 drop into the right eye 2 (two) times daily. 03/15/21   [provider]  JAKAFI 10 MG tablet Take 10 mg by mouth 2 (two) times daily. 03/15/21   [provider]  lisinopril (ZESTRIL) 10 MG tablet Take 10 mg by mouth daily. 06/08/21   [provider]  magnesium oxide (MAG-OX) 400 MG tablet Take 1 tablet by mouth 2 (two) times daily. 06/08/21   [provider]  omeprazole (PRILOSEC) 40 MG capsule Take 40 mg by mouth daily. 11/10/18   [provider]  ondansetron (ZOFRAN-ODT) 8 MG disintegrating tablet DISSOLVE 1 TABLET IN MOUTH EVERY 8 HOURS AS NEEDED FOR NAUSEA FOR UP TO 3 DAYS 06/19/21   Tysinger, Camelia Eng, PA-C  posaconazole (NOXAFIL) 100 MG TBEC delayed-release tablet Take 300 mg by mouth daily. 06/07/21   [provider]  pregabalin (LYRICA) 50 MG capsule Take 50 mg by mouth 3 (three) times a week. 03/30/21   [provider]  rosuvastatin (CRESTOR) 10 MG tablet Take 10 mg by mouth daily. 05/11/21    [provider]  sildenafil (REVATIO) 20 MG tablet Take 1 to 5 pills as needed Patient taking differently: Take 20 mg by mouth daily as needed. Take 1 to 5 pills as needed 11/29/16   Denita Lung, MD  tacrolimus (PROGRAF) 0.5 MG capsule Take 0.5 mg by mouth daily. 06/16/21   [provider]  thiamine 100 MG tablet Take 100 mg by mouth daily. 06/08/21   [provider]  traMADol (ULTRAM) 50 MG tablet Take 1 tablet (50 mg total) by mouth every 8 (eight) hours as needed for up to 5 days. 06/24/21 06/29/21  Denita Lung, MD  Vitamin D, Ergocalciferol, (DRISDOL) 1.25 MG (50000 UT) CAPS capsule Take 50,000 Units by mouth every 7 (seven) days. 11/06/18   [provider]  amLODipine (NORVASC) 5 MG tablet Take 5 mg by mouth daily.  11/21/19  [provider]  azelastine (ASTELIN) 0.1 % nasal spray Place 2 sprays into both nostrils 2 (two) times daily. Use in each nostril as directed Patient not taking: Reported on 09/14/2019 01/03/16 11/21/19  Denita Lung, MD  mometasone (NASONEX) 50 MCG/ACT nasal spray Place 2 sprays into the nose as needed. 09/25/12 11/21/19  Denita Lung, MD    Physical Exam: Vitals:   06/25/21 1000 06/25/21 1015 06/25/21 1100 06/25/21 1115  BP: (!) 157/69 (!) 194/83 (!) 175/75 (!) 172/74  Pulse: (!) 57 (!) 59 (!) 58 (!) 58  Resp: 16 (!) _0 Temp:      TempSrc:      SpO2: 100% 98% 99% 99%  Weight:      Height:        Physical exam:  General: Not in acute distress HEENT:       Eyes: PERRL, EOMI, no scleral icterus.       ENT: No discharge from the ears and nose, no pharynx injection, no tonsillar enlargement.        Neck: No JVD, no bruit, no mass felt. Heme: No neck lymph node enlargement. Cardiac: S1/S2, RRR, no gallops or rubs. Respiratory: No rales, wheezing, rhonchi or rubs. GI: Soft, nondistended, nontender, no rebound pain, no organomegaly, BS present. GU: No hematuria Ext: No pitting leg edema bilaterally. 1+DP/PT  pulse bilaterally. Musculoskeletal: No joint deformities, No joint redness or warmth, no limitation of ROM in spin.  Skin: Has tick bite mark in his abdominal wall Neuro: Alert, oriented X3, cranial nerves II-XII grossly intact, moves all extremities normally.  Psych: Patient is not psychotic, no suicidal or hemocidal ideation.     Assessment/Plan * Hypertensive urgency- (present on admission) Patient's blood pressure has been poorly controlled recently.  His cardiologist increased his lisinopril dose from 10 to 20 mg without improvement.  Blood pressure is 194/77 in ED today. Dr. Clayborn Bigness of card is consulted.  -Placed on progressive bed for position -Lisinopril 20 mg daily -Add amlodipine 10 mg daily -IV hydralazine 10 mg every acute 2 hours for SBP> 165 -Follow-up Dr. Etta Quill recommendation  Elevated troponin- (present on admission) See A & P under CAD  CAD in native artery -- s/p CABG- (present on admission) Patient has elevated troponin 53--> 47.  Currently patient does not have chest pain, possibly due to demand ischemia. -Trend troponin -Aspirin -As needed nitroglycerin and morphine - check UDS  Headache- (present on admission) Etiology is not clear.  Hypertensive urgency may have contributed partially..  Patient was given 1 mg of Ativan and Compazine 10 mg and IV fluid some improvement in ED.  Patient does not have focal neurodeficit.  CT head and CT angiogram of head and neck are negative for acute issues to explain his headache.  Consulted Dr. Cheral Marker -Follow-up with Dr. Yvetta Coder recommendations -Cannot give prn sumatriptan due to hypertensive urgency  Dyslipidemia, goal LDL below 70- (present on admission) -pt's Crestor is on hold currently  Hx of valvular heart disease - MVP with midl-mod MR, Mild AI with Aortic Sclerosis -s/p of mitral valve clip implantation  Multiple myeloma in remission ([place])- (present on admission) S/p of bone marrow transplantation,  developed GVHD. On immunosuppression currently -Continue home Jakafi, tacrolimus  -Continue acyclovir for prophylaxis  Tick bite- (present on admission) -will one dose of 200 mg of doxycycline  Pulmonary embolism ([place])- (present on admission) - Continue Eliquis  DVT (deep venous thrombosis) ([place])- (present on admission) - Continue Eliquis    Advance Care Planning:   Code Status: Full Code   Consults: Dr. Cheral Marker of neuro and Dr. Clayborn Bigness of card  Family Communication: wife is at bedside  DVT prophylaxis: Patient is on Eliquis   Severity of Illness: The appropriate patient status for this patient is OBSERVATION. Observation status is judged to be reasonable and necessary in order to provide the required intensity of service to ensure the patient's safety. The patient's presenting symptoms, physical exam findings, and initial radiographic and laboratory data in the context of their medical condition is felt to place them at decreased risk for further clinical deterioration. Furthermore, it is anticipated that the patient will be medically stable for discharge from the hospital within 2 midnights of admission.   Author: Ivor Costa, MD 06/25/2021 11:51 AM  For on call review [email or web address].

## 2021-06-25 NOTE — Assessment & Plan Note (Addendum)
S/p of bone marrow transplantation, developed GVHD. On immunosuppression currently -Continue home Jakafi, tacrolimus  -Continue acyclovir for prophylaxis

## 2021-06-25 NOTE — Assessment & Plan Note (Signed)
-   Continue Eliquis 

## 2021-06-25 NOTE — Assessment & Plan Note (Signed)
-  s/p of mitral valve clip implantation

## 2021-06-25 NOTE — Assessment & Plan Note (Signed)
Patient's blood pressure has been poorly controlled recently.  His cardiologist increased his lisinopril dose from 10 to 20 mg without improvement.  Blood pressure is 194/77 in ED today. Dr. Clayborn Bigness of card is consulted.  -Placed on progressive bed for position -Lisinopril 20 mg daily -Add amlodipine 10 mg daily -IV hydralazine 10 mg every acute 2 hours for SBP> 165 -Follow-up Dr. Etta Quill recommendation

## 2021-06-25 NOTE — ED Notes (Signed)
Pharmacy tech at bedside 

## 2021-06-25 NOTE — Consult Note (Addendum)
NEURO HOSPITALIST CONSULT NOTE   Requestig physician: Dr. Blaine Hamper  Reason for Consult: Severe intractable headache  History obtained from:  Patient, Wife and Chart     HPI:                                                                                                                                          [name] is an 61 y.o. 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 On arrival to the ED, he was noted to be in hypertensive urgency with chest pain and elevated Troponin. He was seen at [facility] on 1/23 for intractable headache and had a negative CT head at that time.    CT head and CTA of head and neck in the ED were negative. Neurology was consulted for further evaluation and management recommendations.   On bedside interview with Neuorlogy, he describes his headache as fluctuating in intensity but has not had any completely headache-free episodes since his headache began about 3 weeks ago. Medications have decreased the pain level but not eliminated it. Headache is currently rated as 5/10, throbbing, involving the bifrontal and retroorbital region, radiating to the top and sides of his head. There is associated sonophobia, osmophobia and photophobia. No changes to headache intensity from standing, lying down or sleeping. Headache is at the same overall fluctuating pain level and quality all day and night, without diurnal variation. Has mild neck stiffness. There is significant nausea with occasional vomiting in association with the headache.   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 Roni Bread, [facility]; [facility] 01/2013 EF 57%, No ischemia or Infarction.   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  Mildly dilated LV with normal systolic function (EF 31-49%), moderate LA dilation, upper normal PA pressures ~ 34 mmHg.   Monoclonal gammopathy 09/01/2012   Increased total protein, increased beta globulin peak with decreased gammaglobulin peak on SPEP 08/18/12  IFE not done "possible faint abnormal band"   Multiple myeloma, without mention of having achieved remission 10/14/2012   Stage I [place]; good prognosis international scoring system: see 10/13/12 progress note --> ? in Remission as of 06/19/2013; ? Failed Auto BMT; Continues to be on Chemo  with planned Donor BMT   Normochromic normocytic anemia 09/01/2012   Hb 12.7, MCV 95  08/18/12 was 13.7 1 year ago; concomitant leukopenia  & increased lymphs   Pneumococcal vaccine refused 08/15/2012    Past Surgical History:  Procedure Laterality Date   CARDIAC SURGERY     COLONOSCOPY     ?   CORONARY ARTERY BYPASS GRAFT  10/2003   LIMA to LAD, RIMA to OM1, sequential to OM2; Dr. Elisabeth Cara   MITRAL VALVE SURGERY      Family History  Problem  Relation Age of Onset   Heart disease Father    Hypertension Father    Heart failure Father    Heart attack Brother        at 78   Heart attack Brother        at 86   Healthy Mother              Social History:  reports that he has never smoked. He has never used smokeless tobacco. He reports that he does not drink alcohol and does not use drugs.  Allergies  Allergen Reactions   Atorvastatin Other (See Comments)   Chlorhexidine Gluconate Other (See Comments) and Rash    Burning sensation and redness   Toprol Xl [Metoprolol Tartrate]     Intolerance-  Made him tired    HOME MEDICATIONS:                                                                                                                      No current facility-administered medications on file prior to encounter.   Current Outpatient Medications on File Prior to Encounter  Medication Sig Dispense Refill   acyclovir (ZOVIRAX) 400 MG tablet TAKE ONE TABLET BY MOUTH TWICE DAILY 60 tablet 0   albuterol (VENTOLIN HFA) 108 (90 Base) MCG/ACT inhaler Inhale 2 puffs into the lungs every 4 (four) hours as needed for wheezing or shortness of breath (or cough). 18 g 0   allopurinol (ZYLOPRIM) 100 MG tablet Take 100 mg by mouth daily.     amoxicillin-clavulanate (AUGMENTIN) 875-125 MG tablet Take 1 tablet by mouth 2 (two) times daily. 20 tablet 0   apixaban (ELIQUIS) 2.5 MG TABS tablet Take 2.5 mg by mouth every 12 (twelve) hours.     Dorzolamide HCl-Timolol Mal PF 2-0.5 % SOLN  Place 1 drop into the right eye 2 (two) times daily.     lisinopril (ZESTRIL) 20 MG tablet Take 20 mg by mouth daily.     magnesium oxide (MAG-OX) 400 MG tablet Take 1 tablet by mouth 2 (two) times daily.     omeprazole (PRILOSEC) 40 MG capsule Take 40 mg by mouth daily.     ondansetron (ZOFRAN-ODT) 8 MG disintegrating tablet DISSOLVE 1 TABLET IN MOUTH EVERY 8 HOURS AS NEEDED FOR NAUSEA FOR UP TO 3 DAYS 20 tablet 2   posaconazole (NOXAFIL) 100 MG TBEC  delayed-release tablet Take 300 mg by mouth daily.     pregabalin (LYRICA) 50 MG capsule Take 50 mg by mouth 3 (three) times a week.     ruxolitinib phosphate (JAKAFI) 10 MG tablet Take 10 mg by mouth 2 (two) times daily.     sildenafil (REVATIO) 20 MG tablet Take 1 to 5 pills as needed (Patient taking differently: Take 20 mg by mouth daily as needed. Take 1 to 5 pills as needed) 100 tablet 5   tacrolimus (PROGRAF) 0.5 MG capsule Take 0.5 mg by mouth daily.     thiamine 100 MG tablet Take 100 mg by mouth daily.     traMADol (ULTRAM) 50 MG tablet Take 1 tablet (50 mg total) by mouth every 8 (eight) hours as needed for up to 5 days. 20 tablet 0   Vitamin D, Ergocalciferol, (DRISDOL) 1.25 MG (50000 UT) CAPS capsule Take 50,000 Units by mouth every 7 (seven) days.     rosuvastatin (CRESTOR) 10 MG tablet Take 10 mg by mouth daily.     [DISCONTINUED] amLODipine (NORVASC) 5 MG tablet Take 5 mg by mouth daily.     [DISCONTINUED] azelastine (ASTELIN) 0.1 % nasal spray Place 2 sprays into both nostrils 2 (two) times daily. Use in each nostril as directed (Patient not taking: Reported on 09/14/2019) 30 mL 12   [DISCONTINUED] mometasone (NASONEX) 50 MCG/ACT nasal spray Place 2 sprays into the nose as needed.       ROS:                                                                                                                                       No fever, chills, CP, SOB, cough, trouble talking, confusion, new limb weakness or new limb numbness. 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 Other ROS as per HPI.   Blood pressure (!) 166/87, pulse 66, temperature 97.8 F (36.6 C), temperature source Oral, resp. rate 13, height 5' 5"  (1.651 m), weight 70.3 kg, SpO2 99 %.   General Examination:  Physical Exam   General: Fatigued-appearing,  NAD HEENT-  [place]/AT    Lungs- Respirations unlabored   Extremities- Warm and well perfused  Neurological Examination Mental Status: Awake and alert. Fully oriented x 5 in the context of catching himself with two errors prior to giving the correct answers. Speech is fluent with intact comprehension and naming.  Cranial Nerves: II: Temporal visual fields intact with no extinction to DSS. Pupils reactive with right pupil round and 3 mm (prior glaucoma surgery); left pupil round and 2 mm.   III,IV, VI: No ptosis. EOMI. No nystagmus.  V: Temp sensation equal bilaterally  VII: Smile symmetric VIII: Hearing intact to conversation IX,X: No hypophonia or hoarseness XI: Symmetric XII: Midline tongue extension Motor: BUE 4+/5 proximally and distally without asymmetry BLE 4+/5 proximally and distally without asymmetry No pronator drift Sensory: Temp and light touch intact to BUE. No extinction to DSS.  BLE with decreased sensation in stocking distribution (chronic neuropathy) Deep Tendon Reflexes: 1+ right brachioradialis and biceps. 2+ left brachioradialis and biceps. 0 bilateral patellae Plantars: Right: downgoing   Left: downgoing Cerebellar: No ataxia with FNF bilaterally  Gait: Deferred   Lab Results: Basic Metabolic Panel: Recent Labs  Lab 06/19/21 1743 06/25/21 0128  NA 136 135  K 4.2 4.8  CL 102 101  CO2 24 26  GLUCOSE 105* 123*  BUN 15 16  CREATININE 0.85 1.10  CALCIUM 9.0 9.8    CBC: Recent Labs  Lab 06/19/21 1743 06/25/21 0128  WBC 4.5 5.3  HGB 13.5 12.8*  HCT 39.7 39.3  MCV 97.8 100.0  PLT 188 217    Cardiac Enzymes: No results for input(s): CKTOTAL, CKMB, CKMBINDEX, TROPONINI in the last 168 hours.  Lipid Panel: No results for input(s): CHOL, TRIG, HDL, CHOLHDL, VLDL, LDLCALC in the last 168 hours.  Imaging: CT ANGIO HEAD NECK W WO CM  Result Date: 06/25/2021 CLINICAL DATA:  Sudden and severe headache for over 1 week. Elevated blood pressure EXAM: CT  ANGIOGRAPHY HEAD AND NECK TECHNIQUE: Multidetector CT imaging of the head and neck was performed using the standard protocol during bolus administration of intravenous contrast. Multiplanar CT image reconstructions and MIPs were obtained to evaluate the vascular anatomy. Carotid stenosis measurements (when applicable) are obtained utilizing NASCET criteria, using the distal internal carotid diameter as the denominator. RADIATION DOSE REDUCTION: This exam was performed according to the departmental dose-optimization program which includes automated exposure control, adjustment of the mA and/or kV according to patient size and/or use of iterative reconstruction technique. CONTRAST:  71m OMNIPAQUE IOHEXOL 350 MG/ML SOLN COMPARISON:  Head CT from 6 days ago FINDINGS: CT HEAD FINDINGS Brain: No evidence of acute infarction, hemorrhage, hydrocephalus, extra-axial collection or mass lesion/mass effect. Vascular: See below Skull: Unremarkable Sinuses: No sinusitis Orbits: Unremarkable Review of the MIP images confirms the above findings CTA NECK FINDINGS Aortic arch: 3 vessel branching. No acute finding. Partially covered CABG with enhancing LIMA and venous grafts. Right carotid system: Mild plaque at the bifurcation. No stenosis, beading, or dissection Left carotid system: Mild plaque at the bifurcation. ICA tortuosity with looping. No beading or dissection Vertebral arteries: No proximal subclavian stenosis. Calcified plaque at the right vertebral origin more than left vertebral origin. The left vertebral artery is mildly dominant. No stenosis, beading, or dissection. Skeleton: Unremarkable Other neck: No acute finding Upper chest: Negative Review of the MIP images confirms the above findings CTA HEAD FINDINGS Anterior circulation: No significant stenosis, proximal occlusion, aneurysm, or vascular malformation. Posterior circulation:  Mild atheromatous plaque at the right V4 segment. Vertebral and basilar arteries are  smoothly contoured and widely patent. No branch occlusion, beading, or aneurysm. Venous sinuses: Unremarkable Anatomic variants: Fetal type right PCA Review of the MIP images confirms the above findings IMPRESSION: 1. No emergent finding or explanation for headache. 2. Mild atherosclerosis. Electronically Signed   By: Jorje Guild M.D.   On: 06/25/2021 06:31     Assessment: 61 y.o. 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  1. Neurological exam is nonlateralizing. Peripheral neuropathy and asymmetric pupils due to prior glaucoma surgery are noted. .  2. CT head: No acute intracranial pathology 3. CTA of head and neck:  No emergent finding or explanation for headache. Mild atherosclerosis. 4. MRI brain with and without contrast: Preliminary review of images by Neurology reveals no abnormalities to explain his persistent headache, including no hemorrhage, no mass, no abnormal meningeal or cranial nerve enhancement, no evidence for transependymal flow and no ventricular dilatation. Final report by Radiology is pending.  5. Influenza A, B and coronavirus PCRs all negative on 1/23. U/A unremarkable except for elevated protein and rare bacteria. UTOX negative.  6. DDx for his headache given negative testing above, in conjunction with his history, includes the following: - Meningeal carcinomatosis not detectable by imaging - Status migrainosus  - Severe intractable tension type headache  - Opportunistic infection such as fungal meningitis (including cryptococcal meningitis which is typically associated with severe intractable headache) - Medication side effect: - Headache is listed as one of the most common side effects of Jakafi - Tacrolimus-induced encephalopathy can present with acute severe  headache as the only symptom and there are numerous case reports describing tacrolimus-associated headache; tacrolimus may also result in blood brain barrier disruption and direct meningeal irritation by the drug);   - 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 Tacrolimus level (ordered).  2. CT venogram to rule out intracranial venous sinus thrombosis as possible etiology for his headache given history of DVT and PE 3. May need LP under fluoro if CTV is negative, to include opening pressure, cell count with differential, protein, glucose, IgG index, cryptococcal antigen, bacterial and fungal cultures, AFB, VDRL and cytology (the latter requires 10 cc). The single neurologist at [facility] will be unable to perform LPs due to Code Stroke coverage at [facility] and Telestroke coverage at 4 additional hospitals in conjunction with routine consults and follow ups, without APP coverage.  4. If an LP is to be performed, will need to hold his Eliquis for 24 hours. 5. ID consult  Electronically signed: Dr. Kerney Elbe 06/25/2021, 8:28 AM

## 2021-06-25 NOTE — Assessment & Plan Note (Addendum)
Patient has elevated troponin 53--> 47.  Currently patient does not have chest pain, possibly due to demand ischemia. -Trend troponin -Aspirin -As needed nitroglycerin and morphine - check UDS

## 2021-06-25 NOTE — Assessment & Plan Note (Addendum)
-  pt's Crestor is on hold currently

## 2021-06-25 NOTE — Progress Notes (Signed)
Patient has not arrived to 2A yet from ED. Report given to Charline Bills, RN who will receive patient from ED when patient arrives to floor.

## 2021-06-25 NOTE — ED Notes (Signed)
Dr Niu at bedside 

## 2021-06-25 NOTE — Assessment & Plan Note (Signed)
-  will one dose of 200 mg of doxycycline

## 2021-06-25 NOTE — ED Notes (Signed)
Pt up to use bathroom 

## 2021-06-25 NOTE — ED Provider Notes (Signed)
[facility] Provider Note    Event Date/Time   First MD Initiated Contact with Patient 06/25/21 [phone]     (approximate)   History   Vomiting and Headache   HPI  [name] is a 61 y.o. 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  He is on Eliquis but denies any known head injury.  Was seen in the emergency department at [facility] on 06/19/2021 for the same and had a negative head CT.  Was given Ativan, Compazine and IV fluids and reports his headache resolved for approximately 4 hours and then returned.  He took Imitrex yesterday evening at home which gave him no relief in his pain.  He notes he has had significantly elevated blood pressures as high as the 230s.  He was on 10 mg of lisinopril and states his cardiologist at [facility] increase this to 20 mg yesterday.  He denies any vision changes.  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  No new numbness or weakness.  He did have chest tightness and shortness of breath yesterday but this has resolved.  Has had intermittent nausea and vomiting with these headaches.   History provided by patient and wife.    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 Roni Bread, [facility]; [facility] 01/2013 EF 57%, No ischemia or Infarction.   Erectile dysfunction    Essential hypertension    GVHD (graft versus host  disease) ([place])    H/O cardiovascular stress test 02/22/10   normal perfusion, no ischemia or infarct; treadmill and Myoview perfusion study; Dr. 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  Mildly dilated LV with normal systolic function (EF 80-99%), moderate LA dilation, upper normal PA pressures ~ 34 mmHg.   Monoclonal gammopathy 09/01/2012   Increased total protein, increased beta globulin peak with decreased gammaglobulin peak on SPEP 08/18/12  IFE not done "possible faint abnormal band"   Multiple myeloma, without mention of having achieved remission 10/14/2012   Stage I Durie-Salmon; good prognosis international scoring system: see 10/13/12 progress note --> ? in Remission as of 06/19/2013; ? Failed Auto BMT; Continues to be on Chemo  with planned Donor BMT   Normochromic normocytic anemia 09/01/2012   Hb 12.7, MCV 95  08/18/12 was 13.7 1 year ago; concomitant leukopenia  & increased lymphs   Pneumococcal vaccine refused 08/15/2012    Past Surgical History:  Procedure Laterality Date   CARDIAC SURGERY     COLONOSCOPY     ?   CORONARY ARTERY BYPASS  GRAFT  10/2003   LIMA to LAD, RIMA to OM1, sequential to OM2; Dr. Elisabeth Cara   MITRAL VALVE SURGERY      MEDICATIONS:  Prior to Admission medications   Medication Sig Start Date End Date Taking? Authorizing Provider  acyclovir (ZOVIRAX) 400 MG tablet TAKE ONE TABLET BY MOUTH TWICE DAILY    Granfortuna, Alyson Locket, MD  albuterol (VENTOLIN HFA) 108 (90 Base) MCG/ACT inhaler Inhale 2 puffs into the lungs every 4 (four) hours as needed for wheezing or shortness of breath (or cough). 11/21/19   Tasia Catchings, Amy V, PA-C  allopurinol (ZYLOPRIM) 100  MG tablet Take 100 mg by mouth daily. 06/08/21   [provider]  amoxicillin-clavulanate (AUGMENTIN) 875-125 MG tablet Take 1 tablet by mouth 2 (two) times daily. 06/24/21   Denita Lung, MD  apixaban (ELIQUIS) 2.5 MG TABS tablet Take 2.5 mg by mouth every 12 (twelve) hours.    [provider]  Dorzolamide HCl-Timolol Mal PF 2-0.5 % SOLN Place 1 drop into the right eye 2 (two) times daily. 03/15/21   [provider]  JAKAFI 10 MG tablet Take 10 mg by mouth 2 (two) times daily. 03/15/21   [provider]  lisinopril (ZESTRIL) 10 MG tablet Take 10 mg by mouth daily. 06/08/21   [provider]  magnesium oxide (MAG-OX) 400 MG tablet Take 1 tablet by mouth 2 (two) times daily. 06/08/21   [provider]  omeprazole (PRILOSEC) 40 MG capsule Take 40 mg by mouth daily. 11/10/18   [provider]  ondansetron (ZOFRAN-ODT) 8 MG disintegrating tablet DISSOLVE 1 TABLET IN MOUTH EVERY 8 HOURS AS NEEDED FOR NAUSEA FOR UP TO 3 DAYS 06/19/21   Tysinger, Camelia Eng, PA-C  posaconazole (NOXAFIL) 100 MG TBEC delayed-release tablet Take 300 mg by mouth daily. 06/07/21   [provider]  pregabalin (LYRICA) 50 MG capsule Take 50 mg by mouth 3 (three) times a week. 03/30/21   [provider]  rosuvastatin (CRESTOR) 10 MG tablet Take 10 mg by mouth daily. 05/11/21   [provider]  sildenafil (REVATIO) 20 MG tablet Take 1 to 5 pills as needed Patient taking differently: Take 20 mg by mouth daily as needed. Take 1 to 5 pills as needed 11/29/16   Denita Lung, MD  tacrolimus (PROGRAF) 0.5 MG capsule Take 0.5 mg by mouth daily. 06/16/21   [provider]  thiamine 100 MG tablet Take 100 mg by mouth daily. 06/08/21   [provider]  traMADol (ULTRAM) 50 MG tablet Take 1 tablet (50 mg total) by mouth every 8 (eight) hours as needed for up to 5 days. 06/24/21 06/29/21  Denita Lung, MD  Vitamin D, Ergocalciferol, (DRISDOL)  1.25 MG (50000 UT) CAPS capsule Take 50,000 Units by mouth every 7 (seven) days. 11/06/18   [provider]  amLODipine (NORVASC) 5 MG tablet Take 5 mg by mouth daily.  11/21/19  [provider]  azelastine (ASTELIN) 0.1 % nasal spray Place 2 sprays into both nostrils 2 (two) times daily. Use in each nostril as directed Patient not taking: Reported on 09/14/2019 01/03/16 11/21/19  Denita Lung, MD  mometasone (NASONEX) 50 MCG/ACT nasal spray Place 2 sprays into the nose as needed. 09/25/12 11/21/19  Denita Lung, MD    Physical Exam   Triage Vital Signs: ED Triage Vitals  Enc Vitals Group     BP 06/25/21 0108 (!) 180/89     Pulse Rate 06/25/21 0108 (!) 57  Resp 06/25/21 0108 16     Temp 06/25/21 0108 97.8 F (36.6 C)     Temp Source 06/25/21 0108 Oral     SpO2 06/25/21 0108 100 %     Weight 06/25/21 0108 155 lb (70.3 kg)     Height 06/25/21 0108 5' 5" (1.651 m)     Head Circumference --      Peak Flow --      Pain Score 06/25/21 0114 10     Pain Loc --      Pain Edu? --      Excl. in [place]? --     Most recent vital signs: Vitals:   06/25/21 0545 06/25/21 0600  BP: (!) 172/76 (!) 156/78  Pulse: (!) 52 62  Resp: 18 18  Temp:    SpO2: 99% 100%    CONSTITUTIONAL: Alert and oriented and responds appropriately to questions.  Chronically ill-appearing, appears uncomfortable HEAD: Normocephalic, atraumatic EYES: Conjunctivae clear, pupils appear equal, sclera nonicteric ENT: normal nose; moist mucous membranes NECK: Supple, normal ROM CARD: RRR; S1 and S2 appreciated; + murmur, no clicks, no rubs, no gallops RESP: Normal chest excursion without splinting or tachypnea; breath sounds clear and equal bilaterally; no wheezes, no rhonchi, no rales, no hypoxia or respiratory distress, speaking full sentences ABD/GI: Normal bowel sounds; non-distended; soft, non-tender, no rebound, no guarding, no peritoneal signs BACK: The back appears normal EXT: Normal ROM in all  joints; no deformity noted, no edema; no cyanosis SKIN: Normal color for age and race; warm; no rash on exposed skin NEURO: Moves all extremities equally, normal speech, denies any new sensory deficits but has chronic neuropathy in his distal arms and legs bilaterally, cranial nerves II through XII intact PSYCH: The patient's mood and manner are appropriate.   ED Results / Procedures / Treatments   LABS: (all labs ordered are listed, but only abnormal results are displayed) Labs Reviewed  BASIC METABOLIC PANEL - Abnormal; Notable for the following components:      Result Value   Glucose, Bld 123 (*)    All other components within normal limits  CBC - Abnormal; Notable for the following components:   RBC 3.93 (*)    Hemoglobin 12.8 (*)    All other components within normal limits  URINALYSIS, ROUTINE W REFLEX MICROSCOPIC - Abnormal; Notable for the following components:   Hgb urine dipstick TRACE (*)    Protein, ur 30 (*)    All other components within normal limits  HEPATIC FUNCTION PANEL - Abnormal; Notable for the following components:   Alkaline Phosphatase 36 (*)    Indirect Bilirubin 1.0 (*)    All other components within normal limits  URINALYSIS, MICROSCOPIC (REFLEX) - Abnormal; Notable for the following components:   Bacteria, UA RARE (*)    All other components within normal limits  TROPONIN I (HIGH SENSITIVITY) - Abnormal; Notable for the following components:   Troponin I (High Sensitivity) 53 (*)    All other components within normal limits  TROPONIN I (HIGH SENSITIVITY) - Abnormal; Notable for the following components:   Troponin I (High Sensitivity) 47 (*)    All other components within normal limits  LIPASE, BLOOD  TROPONIN I (HIGH SENSITIVITY)     EKG:  EKG Interpretation  Date/Time:  Sunday June 25 2021 01:37:23 EST Ventricular Rate:  55 PR Interval:  258 QRS Duration: 122 QT Interval:  472 QTC Calculation: 451 R Axis:   -32 Text  Interpretation: Sinus bradycardia with 1st degree  A-V block Possible Left atrial enlargement Left axis deviation Left ventricular hypertrophy with QRS widening and repolarization abnormality ( R in aVL , Sokolow-Lyon , Cornell product , Romhilt-Estes ) Abnormal ECG When compared with ECG of 18-Nov-2003 07:10, Significant changes have occurred Confirmed by Pryor Curia [phone]) on 06/25/2021 5:22:43 AM         RADIOLOGY: My personal review and interpretation of imaging: CTA head and neck show no acute abnormality.  I have personally reviewed all radiology reports.   CT ANGIO HEAD NECK W WO CM  Result Date: 06/25/2021 CLINICAL DATA:  Sudden and severe headache for over 1 week. Elevated blood pressure EXAM: CT ANGIOGRAPHY HEAD AND NECK TECHNIQUE: Multidetector CT imaging of the head and neck was performed using the standard protocol during bolus administration of intravenous contrast. Multiplanar CT image reconstructions and MIPs were obtained to evaluate the vascular anatomy. Carotid stenosis measurements (when applicable) are obtained utilizing NASCET criteria, using the distal internal carotid diameter as the denominator. RADIATION DOSE REDUCTION: This exam was performed according to the departmental dose-optimization program which includes automated exposure control, adjustment of the mA and/or kV according to patient size and/or use of iterative reconstruction technique. CONTRAST:  92m OMNIPAQUE IOHEXOL 350 MG/ML SOLN COMPARISON:  Head CT from 6 days ago FINDINGS: CT HEAD FINDINGS Brain: No evidence of acute infarction, hemorrhage, hydrocephalus, extra-axial collection or mass lesion/mass effect. Vascular: See below Skull: Unremarkable Sinuses: No sinusitis Orbits: Unremarkable Review of the MIP images confirms the above findings CTA NECK FINDINGS Aortic arch: 3 vessel branching. No acute finding. Partially covered CABG with enhancing LIMA and venous grafts. Right carotid system: Mild plaque at the  bifurcation. No stenosis, beading, or dissection Left carotid system: Mild plaque at the bifurcation. ICA tortuosity with looping. No beading or dissection Vertebral arteries: No proximal subclavian stenosis. Calcified plaque at the right vertebral origin more than left vertebral origin. The left vertebral artery is mildly dominant. No stenosis, beading, or dissection. Skeleton: Unremarkable Other neck: No acute finding Upper chest: Negative Review of the MIP images confirms the above findings CTA HEAD FINDINGS Anterior circulation: No significant stenosis, proximal occlusion, aneurysm, or vascular malformation. Posterior circulation: Mild atheromatous plaque at the right V4 segment. Vertebral and basilar arteries are smoothly contoured and widely patent. No branch occlusion, beading, or aneurysm. Venous sinuses: Unremarkable Anatomic variants: Fetal type right PCA Review of the MIP images confirms the above findings IMPRESSION: 1. No emergent finding or explanation for headache. 2. Mild atherosclerosis. Electronically Signed   By: JJorje GuildM.D.   On: 06/25/2021 06:31     PROCEDURES:  Critical Care performed: Yes, see critical care procedure note(s)   CRITICAL CARE Performed by: KCyril MourningWard   Total critical care time: 45 minutes  Critical care time was exclusive of separately billable procedures and treating other patients.  Critical care was necessary to treat or prevent imminent or life-threatening deterioration.  Critical care was time spent personally by me on the following activities: development of treatment plan with patient and/or surrogate as well as nursing, discussions with consultants, evaluation of patient's response to treatment, examination of patient, obtaining history from patient or surrogate, ordering and performing treatments and interventions, ordering and review of laboratory studies, ordering and review of radiographic studies, pulse oximetry and re-evaluation of  patient's condition.   .Marland Kitchen-3 Lead EKG Interpretation Performed by: Ward, KDelice Bison DO Authorized by: Ward, KDelice Bison DO     Interpretation: normal     ECG rate:  60  ECG rate assessment: normal     Rhythm: sinus rhythm     Ectopy: none     Conduction: normal      IMPRESSION / MDM / ASSESSMENT AND PLAN / ED COURSE  I reviewed the triage vital signs and the nursing notes.    Patient with complaints of severe headache, nausea and vomiting without focal neurologic deficits.  Also had chest pain and shortness of breath yesterday.  The patient is on the cardiac monitor to evaluate for evidence of arrhythmia and/or significant heart rate changes.   DIFFERENTIAL DIAGNOSIS (includes but not limited to):   Intracranial hemorrhage, CVT, CVA, metastasis, less likely meningitis.  Differential also includes ACS, PE, dissection, hypertensive urgency/emergency.   PLAN: We will obtain CBC, BMP, troponin, CTA of the head and neck.  Will give IV fluids, Compazine for symptomatic relief.   MEDICATIONS GIVEN IN ED: Medications  aspirin chewable tablet 324 mg (has no administration in time range)  hydrALAZINE (APRESOLINE) injection 5 mg (has no administration in time range)  prochlorperazine (COMPAZINE) injection 10 mg (10 mg Intravenous Given 06/25/21 0545)  lactated ringers bolus 1,000 mL (1,000 mLs Intravenous New Bag/Given 06/25/21 0549)  iohexol (OMNIPAQUE) 350 MG/ML injection 75 mL (75 mLs Intravenous Contrast Given 06/25/21 0610)  LORazepam (ATIVAN) injection 1 mg (1 mg Intravenous Given 06/25/21 0604)     ED COURSE: Patient's troponins are elevated.  I have reviewed records from [facility] and there is no previous high-sensitivity troponin for comparison.  I feel he will need admission given his history of CAD status post CABG with intermittent chest pain and elevated troponins.  His EKG is abnormal compared to previous in 2005.  He is not having any chest pain currently.  We will hold  on giving aspirin at this time until head CTs have resulted.  Blood pressure is elevated in the 180s/80s but will hold on antihypertensive medications given it appears during his recent ED visit at [facility] his blood pressure improved significantly once his headache resolved with IV Compazine.  6:00 AM  Nurse reports patient feeling "jittery" after Compazine.  He has tolerated Ativan well in the past.  Will give dose of IV Ativan now.  6:42 AM  Pt's CTA head and neck reviewed by myself and radiology shows no acute abnormality.  Patient reports headache is completely gone after Compazine, fluids and Ativan.  Blood pressure was initially improving but is now 194/77.  Will give dose of IV hydralazine and full dose aspirin.  Will discuss with hospitalist for admission.      CONSULTS:  7:09 AM  Consulted and discussed patient's case with hospitalist, Dr. Blaine Hamper.  I have recommended admission and consulting physician agrees and will place admission orders.  Patient (and family if present) agree with this plan.   I reviewed all nursing notes, vitals, pertinent previous records.  All labs, EKGs, imaging ordered have been independently reviewed and interpreted by myself.    OUTSIDE RECORDS REVIEWED:  Echo at [facility] on 10/17/2020.  INTERPRETATION ---------------------------------------------------------------    MILD LV DYSFUNCTION (See above) WITH MILD LVH    NORMAL RIGHT VENTRICULAR SYSTOLIC FUNCTION    VALVULAR REGURGITATION: MODERATE AR, MILD MR, TRIVIAL PR, MILD TR    Mitra-Clip Visualized at A-2 P-2 segment of Mitral valve.    Mild to Moderate MR.    3D acquisition and reconstructions were performed as part of this    examination to more accurately quantify the effects of identified    structural abnormalities  as part of the exam.     Compared with prior Echo study on 08/25/2020: NO SIGNIFICANT CHANGE          FINAL CLINICAL IMPRESSION(S) / ED DIAGNOSES   Final diagnoses:  Bad  headache  Chest pain, unspecified type  Hypertension, unspecified type  Elevated troponin     Rx / DC Orders   ED Discharge Orders     None        Note:  This document was prepared using Dragon voice recognition software and may include unintentional dictation errors.   Ward, Delice Bison, DO 06/25/21 0710

## 2021-06-26 DIAGNOSIS — I10 Essential (primary) hypertension: Secondary | ICD-10-CM | POA: Diagnosis not present

## 2021-06-26 DIAGNOSIS — R519 Headache, unspecified: Secondary | ICD-10-CM

## 2021-06-26 DIAGNOSIS — Z8679 Personal history of other diseases of the circulatory system: Secondary | ICD-10-CM | POA: Diagnosis not present

## 2021-06-26 DIAGNOSIS — G8929 Other chronic pain: Secondary | ICD-10-CM

## 2021-06-26 DIAGNOSIS — I160 Hypertensive urgency: Secondary | ICD-10-CM | POA: Diagnosis not present

## 2021-06-26 DIAGNOSIS — C9001 Multiple myeloma in remission: Secondary | ICD-10-CM

## 2021-06-26 DIAGNOSIS — I351 Nonrheumatic aortic (valve) insufficiency: Secondary | ICD-10-CM | POA: Diagnosis not present

## 2021-06-26 DIAGNOSIS — I2510 Atherosclerotic heart disease of native coronary artery without angina pectoris: Secondary | ICD-10-CM | POA: Diagnosis not present

## 2021-06-26 LAB — LIPID PANEL
Cholesterol: 310 mg/dL — ABNORMAL HIGH (ref 0–200)
HDL: 62 mg/dL (ref >40)
LDL Cholesterol: 226 mg/dL — ABNORMAL HIGH (ref 0–99)
Total CHOL/HDL Ratio: 5 RATIO
Triglycerides: 111 mg/dL (ref <150)
VLDL: 22 mg/dL (ref 0–40)

## 2021-06-26 LAB — BASIC METABOLIC PANEL
Anion gap: 10 (ref 5–15)
BUN: 18 mg/dL (ref 8–23)
CO2: 23 mmol/L (ref 22–32)
Calcium: 9.2 mg/dL (ref 8.9–10.3)
Chloride: 99 mmol/L (ref 98–111)
Creatinine, Ser: 0.93 mg/dL (ref 0.61–1.24)
GFR, Estimated: >60 mL/min (ref >60)
Glucose, Bld: 100 mg/dL — ABNORMAL HIGH (ref 70–99)
Potassium: 3.3 mmol/L — ABNORMAL LOW (ref 3.5–5.1)
Sodium: 132 mmol/L — ABNORMAL LOW (ref 135–145)

## 2021-06-26 LAB — HEMOGLOBIN A1C
Hgb A1c MFr Bld: 6.1 % — ABNORMAL HIGH (ref 4.8–5.6)
Mean Plasma Glucose: 128 mg/dL

## 2021-06-26 MED ORDER — APIXABAN 2.5 MG PO TABS
2.5000 mg | ORAL_TABLET | Freq: Two times a day (BID) | ORAL | Status: DC
  Administered 2021-06-26 – 2021-06-27 (×3): 2.5 mg via ORAL
  Filled 2021-06-26 (×3): qty 1

## 2021-06-26 MED ORDER — POTASSIUM CHLORIDE CRYS ER 20 MEQ PO TBCR
40.0000 meq | EXTENDED_RELEASE_TABLET | Freq: Once | ORAL | Status: AC
  Administered 2021-06-26: 40 meq via ORAL
  Filled 2021-06-26: qty 2

## 2021-06-26 MED ORDER — METHOCARBAMOL 750 MG PO TABS
750.0000 mg | ORAL_TABLET | Freq: Three times a day (TID) | ORAL | Status: DC | PRN
  Administered 2021-06-26 (×2): 750 mg via ORAL
  Filled 2021-06-26 (×4): qty 1

## 2021-06-26 NOTE — Progress Notes (Signed)
PROGRESS NOTE    LISTER BRIZZI  EPP:[id number] DOB: 1961-03-02 DOA: 06/25/2021 PCP: Denita Lung, MD    Brief Narrative:  61 y.o. 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   Patient states that he has headache which has been going on for more than 2 weeks.  The headache is located in the front head, severe, intermittent, nonradiating.  No unilateral numbness or tinglings in extremities, no facial droop or slurred speech.  Patient was seen in in ED of [facility] on 06/19/2021 for the same and had a negative head CT. Pt was treated with Ativan, Compazine and IV fluids with improvement, but his headache has never completely resolved.  He still has intermittent headache. He took Imitrex yesterday evening at home with no relief in his pain.    Patient states that his blood pressure has been poorly controlled recently.  He is normally taking 10 mg lisinopril, blood pressure has been high, as high as 230 at home.  His cardiologist at the [facility] increased lisinopril to 20 mg yesterday, without significant improvement.  He had a 1 episode of chest pressure yesterday, which has resolved.  Currently patient does not have chest pain, cough, fever or chills.  He has had some shortness of breath. Patient denies nausea, vomiting, diarrhea or abdominal pain.  No symptoms of UTI.  No fever or chills.  He states that he had tick bite to his belly 2 weeks ago.  Patient states that his PCP started Augmentin for him on 1/28 due to suspected sinusitis.  1/30: Patient reports marked improvement in his headache symptoms.  Blood pressure much better  controlled.  Case discussed with neurology.  Still outside chance of optimistic infection despite negative MRI.  Recommending LP with cultures and opening pressure.  This will be done under fluoroscopy guidance.  Will need to hold Eliquis for 24 hours.  Tentative plan for procedure 1/31   Assessment & Plan:   Principal Problem:   Hypertensive urgency Active Problems:   Hx of valvular heart disease - MVP with midl-mod MR, Mild AI with Aortic Sclerosis   CAD in native artery -- s/p CABG   Multiple myeloma in remission (HCC)   Dyslipidemia, goal LDL below 70   Headache   Elevated troponin   Tick bite   DVT (deep venous thrombosis) ([place])   Pulmonary embolism (HCC)  * Hypertensive urgency- (present on admission) Patient's blood pressure has been poorly controlled recently.  His cardiologist increased his lisinopril dose from 10 to 20 mg without improvement.  Blood pressure is 194/77 in ED today. Dr. Clayborn Bigness of card is consulted. -Blood pressure control improved Plan: Amlodipine 10 mg daily Hydralazine 25 mg p.o. every 8 hours Lisinopril 20 mg twice daily As needed IV hydralazine Vitals per unit protocol   Elevated troponin- (present on admission) See A & P under CAD   CAD in native artery -- s/p CABG- (present on admission) Patient has elevated troponin 53--> 47.  Currently patient does not have chest pain, possibly due to demand ischemia. NSTEMI felt unlikely Plan: PTA aspirin As needed nitroglycerin morphine   Intractable headache Etiology is not clear.   Suspect contribution from hypertensive urgency  as headache is improved after good blood pressure control No focal neurologic deficit CTV and MRI negative Neurology on consult Plan: Image guided LP planned 1/31 Hold home Eliquis Ensure good blood pressure control Discontinue lorazepam as patient's wife states excessive sedation and confusion   Dyslipidemia, goal LDL below 70- (present on admission) -pt's Crestor is on  hold currently   Hx of valvular heart disease - MVP with midl-mod MR, Mild AI with Aortic Sclerosis -s/p of mitral valve clip implantation   Multiple myeloma in remission ([place])- (present on admission) S/p of bone marrow transplantation, developed GVHD. On immunosuppression currently.  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 -Continue home Jakafi, tacrolimus  -Continue acyclovir for prophylaxis   Tick bite- (present on admission) -Status post 1 dose doxycycline 200 mg   Pulmonary embolism (HCC)- (present on admission) -Eliquis on hold   DVT (deep venous thrombosis) ([place])- (present on admission) -Eliquis on hold   DVT prophylaxis: SCD Code Status: Full Family Communication: Wife at bedside 1/30 Disposition Plan: Status is: Observation  The patient will require care spanning > 2 midnights and should be moved to inpatient because: Intractable headache in setting of immunosuppression and hypertensive urgency.  Continued concern for opportunistic infection and CNS.  Plan for lumbar puncture 1/31      Level of care: Progressive  Consultants:  Neurology Cardiology  Procedures:  Lumbar puncture plan 1/31  Antimicrobials: None   Subjective: Seen and examined.  Wife at bedside.  Endorses improvement in headache  Objective: Vitals:   06/26/21 0051 06/26/21 0356 06/26/21 0533 06/26/21 0838  BP: 108/75 128/82  116/70  Pulse: 86 77  66  Resp: 18 16  18   Temp: 97.8 F (36.6 C) 97.9 F (36.6 C)  98 F (36.7 C)  TempSrc: Oral Oral    SpO2: 96% 97%  97%  Weight:   70.4 kg   Height:   5' 5"  (1.651 m)     Intake/Output Summary (Last 24 hours) at 06/26/2021 1050 Last data filed at 06/26/2021 1018 Gross per 24 hour  Intake 710 ml  Output 400 ml  Net 310 ml   Filed Weights   06/25/21 0108 06/26/21 0533  Weight: 70.3 kg 70.4 kg    Examination:  General exam: No acute distress Respiratory system: Clear to auscultation. Respiratory effort normal. Cardiovascular  system: S1-S2, RRR, no murmurs, no pedal edema Gastrointestinal system: Soft, NT/ND, normal bowel sounds Central nervous system: Alert and oriented. No focal neurological deficits. Extremities: Symmetric 5 x 5 power. Skin: No rashes, lesions or ulcers Psychiatry: Judgement and insight appear normal. Mood & affect appropriate.     Data Reviewed: I have personally reviewed following labs and imaging studies  CBC: Recent Labs  Lab 06/19/21 1743 06/25/21 0128  WBC 4.5 5.3  HGB 13.5 12.8*  HCT 39.7 39.3  MCV 97.8 100.0  PLT 188 537   Basic Metabolic Panel: Recent Labs  Lab 06/19/21 1743 06/25/21 0128 06/26/21 0457  NA 136 135 132*  K 4.2 4.8 3.3*  CL 102 101 99  CO2 24 26 23   GLUCOSE 105* 123* 100*  BUN 15 16 18   CREATININE 0.85 1.10 0.93  CALCIUM 9.0 9.8 9.2   GFR: Estimated Creatinine Clearance: 72.6 mL/min (by C-G formula based on SCr of 0.93 mg/dL). Liver Function Tests: Recent Labs  Lab 06/19/21 1743 06/25/21 0128  AST 34 29  ALT 32 25  ALKPHOS 39 36*  BILITOT 1.0 1.2  PROT 7.8 7.9  ALBUMIN 4.8 4.9  Recent Labs  Lab 06/19/21 1743 06/25/21 0128  LIPASE 27 29   No results for input(s): AMMONIA in the last 168 hours. Coagulation Profile: No results for input(s): INR, PROTIME in the last 168 hours. Cardiac Enzymes: No results for input(s): CKTOTAL, CKMB, CKMBINDEX, TROPONINI in the last 168 hours. BNP (last 3 results) No results for input(s): PROBNP in the last 8760 hours. HbA1C: Recent Labs    06/25/21 0128  HGBA1C 6.1*   CBG: No results for input(s): GLUCAP in the last 168 hours. Lipid Profile: Recent Labs    06/26/21 0457  CHOL 310*  HDL 62  LDLCALC 226*  TRIG 111  CHOLHDL 5.0   Thyroid Function Tests: No results for input(s): TSH, T4TOTAL, FREET4, T3FREE, THYROIDAB in the last 72 hours. Anemia Panel: No results for input(s): VITAMINB12, FOLATE, FERRITIN, TIBC, IRON, RETICCTPCT in the last 72 hours. Sepsis Labs: No results for  input(s): PROCALCITON, LATICACIDVEN in the last 168 hours.  Recent Results (from the past 240 hour(s))  Resp Panel by RT-PCR (Flu A&B, Covid) Nasopharyngeal Swab     Status: None   Collection Time: 06/19/21  5:46 PM   Specimen: Nasopharyngeal Swab; Nasopharyngeal(NP) swabs in vial transport medium  Result Value Ref Range Status   SARS Coronavirus 2 by RT PCR NEGATIVE NEGATIVE Final    Comment: (NOTE) SARS-CoV-2 target nucleic acids are NOT DETECTED.  The SARS-CoV-2 RNA is generally detectable in upper respiratory specimens during the acute phase of infection. The lowest concentration of SARS-CoV-2 viral copies this assay can detect is 138 copies/mL. A negative result does not preclude SARS-Cov-2 infection and should not be used as the sole basis for treatment or other patient management decisions. A negative result may occur with  improper specimen collection/handling, submission of specimen other than nasopharyngeal swab, presence of viral mutation(s) within the areas targeted by this assay, and inadequate number of viral copies(<138 copies/mL). A negative result must be combined with clinical observations, patient history, and epidemiological information. The expected result is Negative.  Fact Sheet for Patients:  EntrepreneurPulse.com.au  Fact Sheet for Healthcare Providers:  IncredibleEmployment.be  This test is no t yet approved or cleared by the [place] FDA and  has been authorized for detection and/or diagnosis of SARS-CoV-2 by FDA under an Emergency Use Authorization (EUA). This EUA will remain  in effect (meaning this test can be used) for the duration of the COVID-19 declaration under Section 564(b)(1) of the Act, 21 U.S.C.section 360bbb-3(b)(1), unless the authorization is terminated  or revoked sooner.       Influenza A by PCR NEGATIVE NEGATIVE Final   Influenza B by PCR NEGATIVE NEGATIVE Final    Comment: (NOTE) The  Xpert Xpress SARS-CoV-2/FLU/RSV plus assay is intended as an aid in the diagnosis of influenza from Nasopharyngeal swab specimens and should not be used as a sole basis for treatment. Nasal washings and aspirates are unacceptable for Xpert Xpress SARS-CoV-2/FLU/RSV testing.  Fact Sheet for Patients: EntrepreneurPulse.com.au  Fact Sheet for Healthcare Providers: IncredibleEmployment.be  This test is not yet approved or cleared by the [place] FDA and has been authorized for detection and/or diagnosis of SARS-CoV-2 by FDA under an Emergency Use Authorization (EUA). This EUA will remain in effect (meaning this test can be used) for the duration of the COVID-19 declaration under Section 564(b)(1) of the Act, 21 U.S.C. section 360bbb-3(b)(1), unless the authorization is terminated or revoked.  Performed at [facility], [place] [address], [place], [place] [address]   Resp  Panel by RT-PCR (Flu A&B, Covid) Nasopharyngeal Swab     Status: None   Collection Time: 06/25/21  7:34 AM   Specimen: Nasopharyngeal Swab; Nasopharyngeal(NP) swabs in vial transport medium  Result Value Ref Range Status   SARS Coronavirus 2 by RT PCR NEGATIVE NEGATIVE Final    Comment: (NOTE) SARS-CoV-2 target nucleic acids are NOT DETECTED.  The SARS-CoV-2 RNA is generally detectable in upper respiratory specimens during the acute phase of infection. The lowest concentration of SARS-CoV-2 viral copies this assay can detect is 138 copies/mL. A negative result does not preclude SARS-Cov-2 infection and should not be used as the sole basis for treatment or other patient management decisions. A negative result may occur with  improper specimen collection/handling, submission of specimen other than nasopharyngeal swab, presence of viral mutation(s) within the areas targeted by this assay, and inadequate number of viral copies(<138 copies/mL). A negative result must be  combined with clinical observations, patient history, and epidemiological information. The expected result is Negative.  Fact Sheet for Patients:  EntrepreneurPulse.com.au  Fact Sheet for Healthcare Providers:  IncredibleEmployment.be  This test is no t yet approved or cleared by the [place] FDA and  has been authorized for detection and/or diagnosis of SARS-CoV-2 by FDA under an Emergency Use Authorization (EUA). This EUA will remain  in effect (meaning this test can be used) for the duration of the COVID-19 declaration under Section 564(b)(1) of the Act, 21 U.S.C.section 360bbb-3(b)(1), unless the authorization is terminated  or revoked sooner.       Influenza A by PCR NEGATIVE NEGATIVE Final   Influenza B by PCR NEGATIVE NEGATIVE Final    Comment: (NOTE) The Xpert Xpress SARS-CoV-2/FLU/RSV plus assay is intended as an aid in the diagnosis of influenza from Nasopharyngeal swab specimens and should not be used as a sole basis for treatment. Nasal washings and aspirates are unacceptable for Xpert Xpress SARS-CoV-2/FLU/RSV testing.  Fact Sheet for Patients: EntrepreneurPulse.com.au  Fact Sheet for Healthcare Providers: IncredibleEmployment.be  This test is not yet approved or cleared by the [place] FDA and has been authorized for detection and/or diagnosis of SARS-CoV-2 by FDA under an Emergency Use Authorization (EUA). This EUA will remain in effect (meaning this test can be used) for the duration of the COVID-19 declaration under Section 564(b)(1) of the Act, 21 U.S.C. section 360bbb-3(b)(1), unless the authorization is terminated or revoked.  Performed at [facility], [address] [place], [place] [address]          Radiology Studies: CT ANGIO HEAD NECK W WO CM  Result Date: 06/25/2021 CLINICAL DATA:  Sudden and severe headache for over 1 week. Elevated blood  pressure EXAM: CT ANGIOGRAPHY HEAD AND NECK TECHNIQUE: Multidetector CT imaging of the head and neck was performed using the standard protocol during bolus administration of intravenous contrast. Multiplanar CT image reconstructions and MIPs were obtained to evaluate the vascular anatomy. Carotid stenosis measurements (when applicable) are obtained utilizing NASCET criteria, using the distal internal carotid diameter as the denominator. RADIATION DOSE REDUCTION: This exam was performed according to the departmental dose-optimization program which includes automated exposure control, adjustment of the mA and/or kV according to patient size and/or use of iterative reconstruction technique. CONTRAST:  23m OMNIPAQUE IOHEXOL 350 MG/ML SOLN COMPARISON:  Head CT from 6 days ago FINDINGS: CT HEAD FINDINGS Brain: No evidence of acute infarction, hemorrhage, hydrocephalus, extra-axial collection or mass lesion/mass effect. Vascular: See below Skull: Unremarkable Sinuses: No sinusitis Orbits: Unremarkable Review of the MIP  images confirms the above findings CTA NECK FINDINGS Aortic arch: 3 vessel branching. No acute finding. Partially covered CABG with enhancing LIMA and venous grafts. Right carotid system: Mild plaque at the bifurcation. No stenosis, beading, or dissection Left carotid system: Mild plaque at the bifurcation. ICA tortuosity with looping. No beading or dissection Vertebral arteries: No proximal subclavian stenosis. Calcified plaque at the right vertebral origin more than left vertebral origin. The left vertebral artery is mildly dominant. No stenosis, beading, or dissection. Skeleton: Unremarkable Other neck: No acute finding Upper chest: Negative Review of the MIP images confirms the above findings CTA HEAD FINDINGS Anterior circulation: No significant stenosis, proximal occlusion, aneurysm, or vascular malformation. Posterior circulation: Mild atheromatous plaque at the right V4 segment. Vertebral and  basilar arteries are smoothly contoured and widely patent. No branch occlusion, beading, or aneurysm. Venous sinuses: Unremarkable Anatomic variants: Fetal type right PCA Review of the MIP images confirms the above findings IMPRESSION: 1. No emergent finding or explanation for headache. 2. Mild atherosclerosis. Electronically Signed   By: Jorje Guild M.D.   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 CONTRAST:  7.6m GADAVIST GADOBUTROL 1 MMOL/ML IV SOLN COMPARISON:  Same-day CT/CTA head and neck FINDINGS: Brain: There is no evidence of acute intracranial hemorrhage, extra-axial fluid collection, or acute infarct. Background parenchymal volume is normal. The ventricles are normal in size. There is a minimal burden of white matter microangiopathic change. There is no suspicious parenchymal signal abnormality. There are punctate chronic microhemorrhages in the right external capsule and left thalamus, nonspecific. There is no abnormal enhancement or mass lesion. There is no midline shift. Vascular: Normal flow voids. Skull and upper cervical spine: Normal marrow signal. Sinuses/Orbits: The paranasal sinuses are clear. The globes and orbits are unremarkable. Other: None. IMPRESSION: No acute intracranial pathology to explain the patient's headache. Electronically Signed   By: PValetta MoleM.D.   On: 06/25/2021 15:44   CT VENOGRAM HEAD  Result Date: 06/25/2021 CLINICAL DATA:  Dural venous sinus thrombosis suspected EXAM: CT VENOGRAM HEAD TECHNIQUE: Venographic phase images of the brain were obtained following the administration of intravenous contrast. Multiplanar reformats  and maximum intensity projections were generated. RADIATION DOSE REDUCTION: This exam was performed according to the departmental dose-optimization program which includes automated exposure control, adjustment of the mA and/or kV according to patient size and/or use of iterative reconstruction technique. CONTRAST:  885mOMNIPAQUE IOHEXOL 350 MG/ML SOLN COMPARISON:  None. FINDINGS: Brain: There is no mass, hemorrhage or extra-axial collection. The size and configuration of the ventricles and extra-axial CSF spaces are normal. The brain parenchyma is normal, without acute or chronic infarction. Vascular: No abnormal hyperdensity of the major intracranial arteries or dural venous sinuses. No intracranial atherosclerosis. Superior sagittal sinus: Normal. Straight sinus: Normal. Inferior sagittal sinus, vein of Galen and internal cerebral veins: Normal. Transverse sinuses: Diminutive left transverse sinus is a normal congenital variant. Normal right transverse sinus. Sigmoid sinuses: Normal. Visualized jugular veins: Normal. Skull: The visualized skull base, calvarium and extracranial soft tissues are normal. Sinuses/Orbits: No fluid levels or advanced mucosal thickening of the visualized paranasal sinuses. No mastoid or middle ear effusion. The orbits are normal. IMPRESSION: Normal CT venogram. Electronically Signed  By: Ulyses Jarred M.D.   On: 06/25/2021 19:05        Scheduled Meds:  acyclovir  400 mg Oral BID   allopurinol  100 mg Oral Daily   amLODipine  10 mg Oral Daily   aspirin EC  81 mg Oral Daily   dorzolamide  1 drop Right Eye BID   And   timolol  1 drop Right Eye BID   hydrALAZINE  25 mg Oral Q8H   lisinopril  20 mg Oral BID   magnesium oxide  400 mg Oral BID   pantoprazole  40 mg Oral Daily   posaconazole  300 mg Oral Daily   ruxolitinib phosphate  10 mg Oral BID   tacrolimus  0.5 mg Oral Daily   thiamine  100 mg Oral Daily   Continuous Infusions:   LOS: 0 days    Time spent: 35  minutes    Sidney Ace, MD [facility]   If 7PM-7AM, please contact night-coverage  06/26/2021, 10:50 AM

## 2021-06-26 NOTE — Progress Notes (Signed)
[facility]    SUBJECTIVE: Patient states to be doing reasonably well normal improved blood pressure normal heart rate no chest pain.  No recent cardiac symptoms states to be doing reasonably well   Vitals:   06/26/21 0356 06/26/21 0533 06/26/21 0838 06/26/21 1126  BP: 128/82  116/70 112/70  Pulse: 77  66 66  Resp: 16  18 18   Temp: 97.9 F (36.6 C)  98 F (36.7 C) 97.6 F (36.4 C)  TempSrc: Oral   Oral  SpO2: 97%  97% 98%  Weight:  70.4 kg    Height:  5' 5"  (1.651 m)       Intake/Output Summary (Last 24 hours) at 06/26/2021 1201 Last data filed at 06/26/2021 1113 Gross per 24 hour  Intake 950 ml  Output 400 ml  Net 550 ml      PHYSICAL EXAM  General: Well developed, well nourished, in no acute distress HEENT:  Normocephalic and atramatic Neck:  No JVD.  Lungs: Clear bilaterally to auscultation and percussion. Heart: HRRR . Normal S1 and S2 without gallops or murmurs.  Abdomen: Bowel sounds are positive, abdomen soft and non-tender  Msk:  Back normal, normal gait. Normal strength and tone for age. Extremities: No clubbing, cyanosis or edema.   Neuro: Alert and oriented X 3. Psych:  Good affect, responds appropriately   LABS: Basic Metabolic Panel: Recent Labs    06/25/21 0128 06/26/21 0457  NA 135 132*  K 4.8 3.3*  CL 101 99  CO2 26 23  GLUCOSE 123* 100*  BUN 16 18  CREATININE 1.10 0.93  CALCIUM 9.8 9.2   Liver Function Tests: Recent Labs    06/25/21 0128  AST 29  ALT 25  ALKPHOS 36*  BILITOT 1.2  PROT 7.9  ALBUMIN 4.9   Recent Labs    06/25/21 0128  LIPASE 29   CBC: Recent Labs    06/25/21 0128  WBC 5.3  HGB 12.8*  HCT 39.3  MCV 100.0  PLT 217   Cardiac Enzymes: No results for input(s): CKTOTAL, CKMB, CKMBINDEX, TROPONINI in the last 72 hours. BNP: Invalid input(s): POCBNP D-Dimer: No results for input(s): DDIMER in the last 72 hours. Hemoglobin A1C: Recent Labs    06/25/21 0128  HGBA1C 6.1*   Fasting Lipid  Panel: Recent Labs    06/26/21 0457  CHOL 310*  HDL 62  LDLCALC 226*  TRIG 111  CHOLHDL 5.0   Thyroid Function Tests: No results for input(s): TSH, T4TOTAL, T3FREE, THYROIDAB in the last 72 hours.  Invalid input(s): FREET3 Anemia Panel: No results for input(s): VITAMINB12, FOLATE, FERRITIN, TIBC, IRON, RETICCTPCT in the last 72 hours.  CT ANGIO HEAD NECK W WO CM  Result Date: 06/25/2021 CLINICAL DATA:  Sudden and severe headache for over 1 week. Elevated blood pressure EXAM: CT ANGIOGRAPHY HEAD AND NECK TECHNIQUE: Multidetector CT imaging of the head and neck was performed using the standard protocol during bolus administration of intravenous contrast. Multiplanar CT image reconstructions and MIPs were obtained to evaluate the vascular anatomy. Carotid stenosis measurements (when applicable) are obtained utilizing NASCET criteria, using the distal internal carotid diameter as the denominator. RADIATION DOSE REDUCTION: This exam was performed according to the departmental dose-optimization program which includes automated exposure control, adjustment of the mA and/or kV according to patient size and/or use of iterative reconstruction technique. CONTRAST:  52m OMNIPAQUE IOHEXOL 350 MG/ML SOLN COMPARISON:  Head CT from 6 days ago FINDINGS: CT HEAD FINDINGS Brain: No evidence of acute infarction,  hemorrhage, hydrocephalus, extra-axial collection or mass lesion/mass effect. Vascular: See below Skull: Unremarkable Sinuses: No sinusitis Orbits: Unremarkable Review of the MIP images confirms the above findings CTA NECK FINDINGS Aortic arch: 3 vessel branching. No acute finding. Partially covered CABG with enhancing LIMA and venous grafts. Right carotid system: Mild plaque at the bifurcation. No stenosis, beading, or dissection Left carotid system: Mild plaque at the bifurcation. ICA tortuosity with looping. No beading or dissection Vertebral arteries: No proximal subclavian stenosis. Calcified plaque at  the right vertebral origin more than left vertebral origin. The left vertebral artery is mildly dominant. No stenosis, beading, or dissection. Skeleton: Unremarkable Other neck: No acute finding Upper chest: Negative Review of the MIP images confirms the above findings CTA HEAD FINDINGS Anterior circulation: No significant stenosis, proximal occlusion, aneurysm, or vascular malformation. Posterior circulation: Mild atheromatous plaque at the right V4 segment. Vertebral and basilar arteries are smoothly contoured and widely patent. No branch occlusion, beading, or aneurysm. Venous sinuses: Unremarkable Anatomic variants: Fetal type right PCA Review of the MIP images confirms the above findings IMPRESSION: 1. No emergent finding or explanation for headache. 2. Mild atherosclerosis. Electronically Signed   By: Jorje Guild M.D.   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 CONTRAST:  7.34m GADAVIST GADOBUTROL 1 MMOL/ML IV SOLN COMPARISON:  Same-day CT/CTA head and neck FINDINGS: Brain: There is no evidence of acute intracranial hemorrhage, extra-axial fluid collection, or acute infarct. Background parenchymal volume is normal. The ventricles are normal in size. There is a minimal burden of white matter microangiopathic change. There is no suspicious parenchymal signal abnormality. There are punctate chronic microhemorrhages in the right external capsule and left thalamus, nonspecific. There is no abnormal enhancement or mass lesion. There is no midline shift. Vascular: Normal flow voids. Skull and upper cervical spine: Normal marrow signal. Sinuses/Orbits: The paranasal sinuses  are clear. The globes and orbits are unremarkable. Other: None. IMPRESSION: No acute intracranial pathology to explain the patient's headache. Electronically Signed   By: PValetta MoleM.D.   On: 06/25/2021 15:44   CT VENOGRAM HEAD  Result Date: 06/25/2021 CLINICAL DATA:  Dural venous sinus thrombosis suspected EXAM: CT VENOGRAM HEAD TECHNIQUE: Venographic phase images of the brain were obtained following the administration of intravenous contrast. Multiplanar reformats and maximum intensity projections were generated. RADIATION DOSE REDUCTION: This exam was performed according to the departmental dose-optimization program which includes automated exposure control, adjustment of the mA and/or kV according to patient size and/or use of iterative reconstruction technique. CONTRAST:  838mOMNIPAQUE IOHEXOL 350 MG/ML SOLN COMPARISON:  None. FINDINGS: Brain: There is no mass, hemorrhage or extra-axial collection. The size and configuration of the ventricles and extra-axial CSF spaces are normal. The brain parenchyma is normal, without acute or chronic infarction. Vascular: No abnormal hyperdensity of the major intracranial arteries or dural venous sinuses. No intracranial atherosclerosis. Superior sagittal sinus: Normal. Straight sinus: Normal. Inferior sagittal sinus, vein of Galen and internal cerebral veins: Normal. Transverse sinuses: Diminutive left transverse sinus is a normal congenital variant. Normal right transverse sinus. Sigmoid sinuses: Normal. Visualized jugular veins: Normal. Skull: The visualized skull base, calvarium and extracranial soft tissues are normal. Sinuses/Orbits: No fluid levels or advanced mucosal thickening of  the visualized paranasal sinuses. No mastoid or middle ear effusion. The orbits are normal. IMPRESSION: Normal CT venogram. Electronically Signed   By: Ulyses Jarred M.D.   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, Mild AI with Aortic Sclerosis   CAD in native artery -- s/p CABG   Multiple myeloma in remission ([place])   Dyslipidemia, goal LDL below 70   Headache   Elevated troponin   Tick bite   DVT (deep venous thrombosis) ([place])   Pulmonary embolism ([facility])    Plan Hypertensive urgency significantly improved on new blood pressure regimen continue current management History of significant valvular disease mitral valve prolapse mild to moderate MR aortic stenosis continue current medical therapy Coronary artery disease status post coronary bypass surgery no recent angina continue medical therapy Hyperlipidemia continue current therapy for lipid management Multiple myeloma in remission continue follow-up with hematology Headaches much improved blood pressure much improved continue conservative management Status post tick bite recommend antibiotic therapy follow-up with ID Continue conservative management    Yolonda Kida, MD 06/26/2021 12:01 PM

## 2021-06-26 NOTE — Plan of Care (Signed)
Per Dr. Priscella Mann, patient's headache much improved with better control of BP. ID feels that risk of opportunistic infection is low. No indication for LP at this time, ok to restart eliquis. No further inpatient neuro workup indicated. Neurology will sign off, but please re-engage if any additional questions arise.  Su Monks, MD [facility] [phone]  If 7pm- 7am, please page neurology on call as listed in [place].

## 2021-06-26 NOTE — Consult Note (Signed)
NAME: [name]  DOB: 1960/10/23  MRN: [id number]  Date/Time: 06/26/2021 11:23 AM  REQUESTING PROVIDER: Dr.Sreenath Subjective:  REASON FOR CONSULT: R/o OI CNS infection ? [name] is a 61 y.o. 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 The cold symptoms resolved and he was left with a headache .He saw provider on 1/19 and was prescribed amoxicillin for residual sinus infeciton. Tramadol was called in on 06/18/21 .  his oncologist on his visit on 06/21/21  prescribed imitrex . The oncologist had mentioned in her note that he was not taking JAK1 inhibitor because of insurance issue the whole month of Dec and restarted it in Jan No fever No nausea or vomiting No diplopia or blurred vision There is a mention of abnormal pupillary reflex in the record Pt says 2 weeks ago he removed a tick from his abdominal wall- it was a soft tick, grey in color and he says there was no blood when he squeezed it. He has two dogs at home No travel No swimming In the ED bp 180/89, temp 97.8, HR 57 and sats 100%. 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 Roni Bread, [facility]; [facility] 01/2013 EF 57%, No ischemia or Infarction.   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  Mildly dilated LV with normal systolic function (EF 61-22%), moderate LA dilation, upper normal PA pressures ~ 34 mmHg.   Monoclonal gammopathy 09/01/2012   Increased total protein, increased beta globulin peak with decreased gammaglobulin peak on SPEP 08/18/12  IFE not done "possible faint abnormal band"   Multiple myeloma, without mention of having achieved remission 10/14/2012   Stage I [place]; good prognosis international scoring system: see 10/13/12 progress note --> ? in Remission as of 06/19/2013; ? Failed Auto BMT; Continues to be on Chemo  with planned Donor BMT   Normochromic normocytic anemia 09/01/2012   Hb 12.7, MCV 95  08/18/12 was 13.7 1 year ago; concomitant leukopenia  & increased lymphs   Pneumococcal vaccine refused 08/15/2012    Past Surgical History:  Procedure Laterality Date   CARDIAC SURGERY  COLONOSCOPY     ?   CORONARY ARTERY BYPASS GRAFT  10/2003   LIMA to LAD, RIMA to OM1, sequential to OM2; Dr. Elisabeth Cara   MITRAL VALVE SURGERY      Social History   Socioeconomic History   Marital status: Married    Spouse name: Not on file   Number of children: 3   Years of education: Not on file   Highest education level: Not on file  Occupational History   Occupation: owns paving company    Employer: [employer]  Tobacco Use   Smoking status: Never   Smokeless tobacco: Never  Vaping Use   Vaping Use: Never used   Substance and Sexual Activity   Alcohol use: No   Drug use: No   Sexual activity: Not on file  Other Topics Concern   Not on file  Social History Narrative   Not on file   Social Determinants of Health   Financial Resource Strain: Not on file  Food Insecurity: Not on file  Transportation Needs: Not on file  Physical Activity: Not on file  Stress: Not on file  Social Connections: Not on file  Intimate Partner Violence: Not on file    Family History  Problem Relation Age of Onset   Heart disease Father    Hypertension Father    Heart failure Father    Heart attack Brother        at 61   Heart attack Brother        at 39   Healthy Mother    Allergies  Allergen Reactions   Atorvastatin Other (See Comments)   Chlorhexidine Gluconate Other (See Comments) and Rash    Burning sensation and redness   Toprol Xl [Metoprolol Tartrate]     Intolerance-  Made him tired   I? Current Facility-Administered Medications  Medication Dose Route Frequency Provider Last Rate Last Admin   acetaminophen (TYLENOL) tablet 650 mg  650 mg Oral Q6H PRN Ivor Costa, MD       acyclovir (ZOVIRAX) 200 MG capsule 400 mg  400 mg Oral BID Ivor Costa, MD   400 mg at 06/26/21 0957   albuterol (PROVENTIL) (2.5 MG/3ML) 0.083% nebulizer solution 2.5 mg  2.5 mg Nebulization Q4H PRN Ivor Costa, MD       allopurinol (ZYLOPRIM) tablet 100 mg  100 mg Oral Daily Ivor Costa, MD   100 mg at 06/26/21 0957   amLODipine (NORVASC) tablet 10 mg  10 mg Oral Daily Ivor Costa, MD   10 mg at 06/26/21 2951   aspirin EC tablet 81 mg  81 mg Oral Daily Ivor Costa, MD   81 mg at 06/26/21 0955   dorzolamide (TRUSOPT) 2 % ophthalmic solution 1 drop  1 drop Right Eye BID Ivor Costa, MD   1 drop at 06/25/21 1326   And   timolol (TIMOPTIC) 0.5 % ophthalmic solution 1 drop  1 drop Right Eye BID Ivor Costa, MD   1 drop at 06/25/21 1326   hydrALAZINE (APRESOLINE) injection 10 mg  10 mg Intravenous Q2H PRN Ivor Costa, MD   10 mg at  06/25/21 2021   hydrALAZINE (APRESOLINE) tablet 25 mg  25 mg Oral Q8H Callwood, Dwayne D, MD   25 mg at 06/26/21 0504   lisinopril (ZESTRIL) tablet 20 mg  20 mg Oral BID Callwood, Dwayne D, MD   20 mg at 06/26/21 0955   magnesium oxide (MAG-OX) tablet 400 mg  400 mg Oral BID Ivor Costa,  MD   400 mg at 06/26/21 0955   morphine 2 MG/ML injection 2 mg  2 mg Subcutaneous Q4H PRN Ivor Costa, MD       nitroGLYCERIN (NITROSTAT) SL tablet 0.4 mg  0.4 mg Sublingual Q5 min PRN Ivor Costa, MD       ondansetron [facility]) injection 4 mg  4 mg Intravenous Q8H PRN Ivor Costa, MD       pantoprazole (PROTONIX) EC tablet 40 mg  40 mg Oral Daily Ivor Costa, MD   40 mg at 06/26/21 7353   posaconazole (NOXAFIL) delayed-release tablet 300 mg  300 mg Oral Daily Ivor Costa, MD   300 mg at 06/26/21 2992   ruxolitinib phosphate (JAKAFI) tablet 10 mg  10 mg Oral BID Renda Rolls, RPH   10 mg at 06/26/21 1043   tacrolimus (PROGRAF) capsule 0.5 mg  0.5 mg Oral Daily Ivor Costa, MD   0.5 mg at 06/26/21 4268   thiamine tablet 100 mg  100 mg Oral Daily Ivor Costa, MD   100 mg at 06/26/21 3419   traMADol (ULTRAM) tablet 50 mg  50 mg Oral Q8H PRN Ivor Costa, MD   50 mg at 06/25/21 1217     Abtx:  Anti-infectives (From admission, onward)    Start     Dose/Rate Route Frequency Ordered Stop   06/25/21 1015  acyclovir (ZOVIRAX) 200 MG capsule 400 mg        400 mg Oral 2 times daily 06/25/21 1003     06/25/21 1015  posaconazole (NOXAFIL) delayed-release tablet 300 mg        300 mg Oral Daily 06/25/21 1003     06/25/21 0930  doxycycline (VIBRA-TABS) tablet 200 mg       Note to Pharmacy: Tick bite   200 mg Oral  Once 06/25/21 0917 06/25/21 1007       REVIEW OF SYSTEMS:  Const: negative fever, negative chills, negative weight loss Eyes: negative diplopia or visual changes, negative eye pain ENT: negative coryza, negative sore throat Resp: negative cough, hemoptysis, dyspnea Cards: negative for chest pain, palpitations,  lower extremity edema GU: negative for frequency, dysuria and hematuria GI: Negative for abdominal pain, diarrhea, bleeding, constipation Skin: negative for rash and pruritus Heme: negative for easy bruising and gum/nose bleeding MS: negative for myalgias, arthralgias, back pain and muscle weakness Neurolo:negative for headaches, dizziness, vertigo, memory problems  Psych: negative for feelings of anxiety, depression  Endocrine: negative for thyroid, diabetes Allergy/Immunology- negative for any medication or food allergies ? Pertinent Positives include : Objective:  VITALS:  BP 116/70 (BP Location: Right Arm)    Pulse 66    Temp 98 F (36.7 C)    Resp 18    Ht _0  (1.651 m)    Wt 70.4 kg    SpO2 97%    BMI 25.82 kg/m  PHYSICAL EXAM:  General: Alert, cooperative, no distress, appears stated age.  Head: Normocephalic, without obvious abnormality, atraumatic. Eyes: Conjunctivae clear, anicteric sclerae. Pupils are equal ENT Nares normal. No drainage or sinus tenderness. Lips, mucosa, and tongue normal. No Thrush Neck: Supple, symmetrical, no adenopathy, thyroid: non tender no carotid bruit and no JVD. Back: No CVA tenderness. Lungs: Clear to auscultation bilaterally. No Wheezing or Rhonchi. No rales. Heart: Regular rate and rhythm, no murmur, rub or gallop. Abdomen: Soft, non-tender,not distended. Bowel sounds normal. No masses Extremities: atraumatic, no cyanosis. No edema. No clubbing Skin: No rashes or lesions. Or bruising Lymph: Cervical, supraclavicular normal.  Neurologic: Grossly non-focal Pertinent Labs Lab Results CBC    Component Value Date/Time   WBC 5.3 06/25/2021 0128   RBC 3.93 (L) 06/25/2021 0128   HGB 12.8 (L) 06/25/2021 0128   HGB 12.5 (L) 06/26/2013 0916   HCT 39.3 06/25/2021 0128   HCT 36.7 (L) 06/26/2013 0916   PLT 217 06/25/2021 0128   PLT 144 06/26/2013 0916   MCV 100.0 06/25/2021 0128   MCV 97.5 06/26/2013 0916   MCH 32.6 06/25/2021 0128   MCHC  32.6 06/25/2021 0128   RDW 14.4 06/25/2021 0128   RDW 14.1 06/26/2013 0916   LYMPHSABS 1.8 03/14/2015 0001   LYMPHSABS 1.1 06/26/2013 0916   MONOABS 1.2 (H) 03/14/2015 0001   MONOABS 0.4 06/26/2013 0916   EOSABS 3.9 (H) 03/14/2015 0001   EOSABS 0.1 06/26/2013 0916   BASOSABS 0.1 03/14/2015 0001   BASOSABS 0.0 06/26/2013 0916    CMP Latest Ref Rng & Units 06/26/2021 06/25/2021 06/19/2021  Glucose 70 - 99 mg/dL 100(H) 123(H) 105(H)  BUN 8 - 23 mg/dL _0 Creatinine 0.61 - 1.24 mg/dL 0.93 1.10 0.85  Sodium 135 - 145 mmol/L 132(L) 135 136  Potassium 3.5 - 5.1 mmol/L 3.3(L) 4.8 4.2  Chloride 98 - 111 mmol/L 99 101 102  CO2 22 - 32 mmol/L _1 Calcium 8.9 - 10.3 mg/dL 9.2 9.8 9.0  Total Protein 6.5 - 8.1 g/dL - 7.9 7.8  Total Bilirubin 0.3 - 1.2 mg/dL - 1.2 1.0  Alkaline Phos 38 - 126 U/L - 36(L) 39  AST 15 - 41 U/L - 29 34  ALT 0 - 44 U/L - 25 32      Microbiology: Recent Results (from the past 240 hour(s))  Resp Panel by RT-PCR (Flu A&B, Covid) Nasopharyngeal Swab     Status: None   Collection Time: 06/19/21  5:46 PM   Specimen: Nasopharyngeal Swab; Nasopharyngeal(NP) swabs in vial transport medium  Result Value Ref Range Status   SARS Coronavirus 2 by RT PCR NEGATIVE NEGATIVE Final    Comment: (NOTE) SARS-CoV-2 target nucleic acids are NOT DETECTED.  The SARS-CoV-2 RNA is generally detectable in upper respiratory specimens during the acute phase of infection. The lowest concentration of SARS-CoV-2 viral copies this assay can detect is 138 copies/mL. A negative result does not preclude SARS-Cov-2 infection and should not be used as the sole basis for treatment or other patient management decisions. A negative result may occur with  improper specimen collection/handling, submission of specimen other than nasopharyngeal swab, presence of viral mutation(s) within the areas targeted by this assay, and inadequate number of viral copies(<138 copies/mL). A negative  result must be combined with clinical observations, patient history, and epidemiological information. The expected result is Negative.  Fact Sheet for Patients:  EntrepreneurPulse.com.au  Fact Sheet for Healthcare Providers:  IncredibleEmployment.be  This test is no t yet approved or cleared by the [place] FDA and  has been authorized for detection and/or diagnosis of SARS-CoV-2 by FDA under an Emergency Use Authorization (EUA). This EUA will remain  in effect (meaning this test can be used) for the duration of the COVID-19 declaration under Section 564(b)(1) of the Act, 21 U.S.C.section 360bbb-3(b)(1), unless the authorization is terminated  or revoked sooner.       Influenza A by PCR NEGATIVE NEGATIVE Final   Influenza B by PCR NEGATIVE NEGATIVE Final    Comment: (NOTE) The Xpert Xpress SARS-CoV-2/FLU/RSV plus assay is intended as an aid in the diagnosis  of influenza from Nasopharyngeal swab specimens and should not be used as a sole basis for treatment. Nasal washings and aspirates are unacceptable for Xpert Xpress SARS-CoV-2/FLU/RSV testing.  Fact Sheet for Patients: EntrepreneurPulse.com.au  Fact Sheet for Healthcare Providers: IncredibleEmployment.be  This test is not yet approved or cleared by the [place] FDA and has been authorized for detection and/or diagnosis of SARS-CoV-2 by FDA under an Emergency Use Authorization (EUA). This EUA will remain in effect (meaning this test can be used) for the duration of the COVID-19 declaration under Section 564(b)(1) of the Act, 21 U.S.C. section 360bbb-3(b)(1), unless the authorization is terminated or revoked.  Performed at [facility], [place] [address], [place], [place] [address]   Resp Panel by RT-PCR (Flu A&B, Covid) Nasopharyngeal Swab     Status: None   Collection Time: 06/25/21  7:34 AM   Specimen: Nasopharyngeal Swab;  Nasopharyngeal(NP) swabs in vial transport medium  Result Value Ref Range Status   SARS Coronavirus 2 by RT PCR NEGATIVE NEGATIVE Final    Comment: (NOTE) SARS-CoV-2 target nucleic acids are NOT DETECTED.  The SARS-CoV-2 RNA is generally detectable in upper respiratory specimens during the acute phase of infection. The lowest concentration of SARS-CoV-2 viral copies this assay can detect is 138 copies/mL. A negative result does not preclude SARS-Cov-2 infection and should not be used as the sole basis for treatment or other patient management decisions. A negative result may occur with  improper specimen collection/handling, submission of specimen other than nasopharyngeal swab, presence of viral mutation(s) within the areas targeted by this assay, and inadequate number of viral copies(<138 copies/mL). A negative result must be combined with clinical observations, patient history, and epidemiological information. The expected result is Negative.  Fact Sheet for Patients:  EntrepreneurPulse.com.au  Fact Sheet for Healthcare Providers:  IncredibleEmployment.be  This test is no t yet approved or cleared by the [place] FDA and  has been authorized for detection and/or diagnosis of SARS-CoV-2 by FDA under an Emergency Use Authorization (EUA). This EUA will remain  in effect (meaning this test can be used) for the duration of the COVID-19 declaration under Section 564(b)(1) of the Act, 21 U.S.C.section 360bbb-3(b)(1), unless the authorization is terminated  or revoked sooner.       Influenza A by PCR NEGATIVE NEGATIVE Final   Influenza B by PCR NEGATIVE NEGATIVE Final    Comment: (NOTE) The Xpert Xpress SARS-CoV-2/FLU/RSV plus assay is intended as an aid in the diagnosis of influenza from Nasopharyngeal swab specimens and should not be used as a sole basis for treatment. Nasal washings and aspirates are unacceptable for Xpert Xpress  SARS-CoV-2/FLU/RSV testing.  Fact Sheet for Patients: EntrepreneurPulse.com.au  Fact Sheet for Healthcare Providers: IncredibleEmployment.be  This test is not yet approved or cleared by the [place] FDA and has been authorized for detection and/or diagnosis of SARS-CoV-2 by FDA under an Emergency Use Authorization (EUA). This EUA will remain in effect (meaning this test can be used) for the duration of the COVID-19 declaration under Section 564(b)(1) of the Act, 21 U.S.C. section 360bbb-3(b)(1), unless the authorization is terminated or revoked.  Performed at [facility], [address] [place], [place] [address]    EKG  IMAGING RESULTS: MRI of the brain- normal I have personally reviewed the films ? Impression/Recommendation ?61 y.o. male with a history of  CAD s/p CABG, mitral vale repair, MM , received melphalan followed by autologous BMT 04/17/2013, and then Fludarabine/Melphalan/Bortezomib followed by MUD allogeneic stem  cell transplant 06/30/14 on the BMT  On ruxolitinib for GvHD ( sclerodermatous chronic ) Presents with headache of 1 month duration  Headache : unclear etiology- with no fever , no neurological signs, and normal MRI opportunistic infections like Crypto, toxo,syphilis unlikely in this immune compromised patient- but will check crypto antigen serum and if positive then will  need LP  He had a tick bite but it was a soft tick /grey color( like a dog tick) so unlikely he has any tick borne illness- but will do some baseline labs Was the headache related to medication like JAK 1 inhibitor- He was off of the medicines in December due to insurance and restarted- so could it be due to that?  Was the headache due to high BP? Pt says his headache is so much better once his BP was better controlled   ?MM- with Matched unrelated donor bone marrow transplant in 2016-  On tacrolimus Has GvHD- on JAK1 inhibitor Also  on acyclovir and posaconazole prophylactic antimicrobials  Bradycardia ( with hypertension) r/o increased ICP  CAD s/p CABG MR- s/p mitral valve repair ___________________________________________________ Discussed with patient, requesting provider Note:  This document was prepared using Dragon voice recognition software and may include unintentional dictation errors.

## 2021-06-27 ENCOUNTER — Encounter: Payer: Self-pay | Admitting: Family Medicine

## 2021-06-27 DIAGNOSIS — I160 Hypertensive urgency: Secondary | ICD-10-CM | POA: Diagnosis not present

## 2021-06-27 LAB — TOXOPLASMA ANTIBODIES- IGG AND  IGM
Toxoplasma Antibody- IgM: <3 AU/mL (ref 0.0–7.9)
Toxoplasma IgG Ratio: <3 IU/mL (ref 0.0–7.1)

## 2021-06-27 LAB — CRYPTOCOCCUS ANTIGEN, SERUM: Cryptococcus Antigen, Serum: NEGATIVE

## 2021-06-27 LAB — TACROLIMUS LEVEL: Tacrolimus (FK506) - LabCorp: 3.6 ng/mL (ref 2.0–20.0)

## 2021-06-27 LAB — RPR: RPR Ser Ql: NONREACTIVE

## 2021-06-27 MED ORDER — AMLODIPINE BESYLATE 10 MG PO TABS: 10.0000 mg | ORAL_TABLET | Freq: Every day | ORAL | 0 refills | Status: AC

## 2021-06-27 MED ORDER — HYDRALAZINE HCL 25 MG PO TABS: 25.0000 mg | ORAL_TABLET | Freq: Three times a day (TID) | ORAL | 0 refills | Status: AC

## 2021-06-27 NOTE — Discharge Summary (Signed)
Physician Discharge Summary  [name] BTD:[id number] DOB: 11/06/1960 DOA: 06/25/2021  PCP: Denita Lung, MD  Admit date: 06/25/2021 Discharge date: 06/27/2021  Admitted From: Home Disposition:  Home  Recommendations for Outpatient Follow-up:  Follow up with PCP in 1-2 weeks   Home Health:No Equipment/Devices:None   Discharge Condition:Stable  CODE STATUS:FULL  Diet recommendation: Regular  Brief/Interim Summary:  61 y.o. 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   Patient states that he has headache which has been going on for more than 2 weeks.  The headache is located in the front head, severe, intermittent, nonradiating.  No unilateral numbness or tinglings in extremities, no facial droop or slurred speech.  Patient was seen in in ED of [facility] on 06/19/2021 for the same and had a negative head CT. Pt was treated with Ativan, Compazine and IV fluids with improvement, but his headache has never completely resolved.  He still has intermittent headache. He took Imitrex yesterday evening at home with no relief in his pain.    Patient states that his blood pressure has been poorly controlled recently.  He is normally taking 10 mg lisinopril, blood pressure has been high, as high as 230 at home.  His cardiologist at [facility] increased lisinopril to 20 mg yesterday, without significant improvement.  He had a 1 episode of chest pressure yesterday, which has resolved.  Currently patient does not have chest pain, cough, fever or chills.  He has had some shortness of breath. Patient denies nausea, vomiting, diarrhea or  abdominal pain.  No symptoms of UTI.  No fever or chills.  He states that he had tick bite to his belly 2 weeks ago.  Patient states that his PCP started Augmentin for him on 1/28 due to suspected sinusitis.   1/30: Patient reports marked improvement in his headache symptoms.  Blood pressure much better controlled.  Case discussed with neurology.  Still outside chance of optimistic infection despite negative MRI.  Recommending LP with cultures and opening pressure.  This will be done under fluoroscopy   1/31: Patient asymptomatic on the day of discharge.  Lengthy discussion between myself, neurology, infectious disease regarding indication for lumbar puncture.  As patient had negative MRI and no other infectious signs as well as the fact that patient's headache had improved after controlled blood pressure decision made to defer LP at this time.  Infectious panel still pending at time of discharge however very low suspicion for infectious etiology.  Patient stable for discharge at this time.  Adjustments have been made at home medication regimen to ensure good blood pressure control.  Recommend home monitoring of blood pressure.   Discharge Diagnoses:  Principal Problem:   Hypertensive urgency Active Problems:   Hx of valvular heart disease - MVP with midl-mod MR, Mild AI with Aortic Sclerosis   CAD in native artery -- s/p CABG   Multiple myeloma in remission (HCC)   Dyslipidemia, goal LDL below 70   Headache   Elevated troponin   Tick bite   DVT (deep venous thrombosis) (HCC)   Pulmonary embolism (HCC) * Hypertensive urgency- (present on admission) Patient's blood pressure has been poorly controlled recently.  His  cardiologist increased his lisinopril dose from 10 to 20 mg without improvement.  Blood pressure is 194/77 in ED today. Dr. Clayborn Bigness of card is consulted. -Blood pressure control improved\ -Headache resolved Plan: Discharge home.  Continue home amlodipine 10 mg daily.  Add  hydralazine 25 mg every 8 hours and lisinopril 20 mg daily.  Recommend home blood pressure monitoring and PCP follow-up.    Elevated troponin- (present on admission) See A & P under CAD   CAD in native artery -- s/p CABG- (present on admission) Patient has elevated troponin 53--> 47.  Currently patient does not have chest pain, possibly due to demand ischemia. NSTEMI ruled out Plan: PTA aspirin    Intractable headache Etiology is not clear.   Suspect contribution from hypertensive urgency as headache is improved after good blood pressure control No focal neurologic deficit CTV and MRI negative Neurology on consult Plan: Discussion between neurology, hospitalist, infectious disease.  As patient had negative MRI and no other obvious infectious signs the likelihood of an infectious etiology for headache or opportunistic infection is very low.  Discharge home at this time.  Home medications have been resumed with adjustments of blood pressure regimen as above   Dyslipidemia, goal LDL below 70- (present on admission) -pt's Crestor is on hold currently.  Resume on discharge   Hx of valvular heart disease - MVP with midl-mod MR, Mild AI with Aortic Sclerosis -s/p of mitral valve clip implantation   Multiple myeloma in remission ([place])- (present on admission) S/p of bone marrow transplantation, developed GVHD. On immunosuppression currently.  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 -Continue home Jakafi, tacrolimus  -Continue acyclovir for prophylaxis   Discharge Instructions  Discharge Instructions     Diet - low sodium heart healthy   Complete by: As directed    Increase activity slowly   Complete by: As directed       Allergies as of 06/27/2021       Reactions   Atorvastatin Other (See Comments)   Generalized body aches and cramps   Chlorhexidine Gluconate Other (See Comments), Rash   Burning sensation and redness   Rosuvastatin Other (See Comments)   cramps   Toprol  Xl [metoprolol Tartrate]    Intolerance-  Made him tired        Medication List     STOP taking these medications    amoxicillin-clavulanate 875-125 MG tablet Commonly known as: AUGMENTIN       TAKE these medications    acyclovir 400 MG tablet Commonly known as: ZOVIRAX TAKE ONE TABLET BY MOUTH TWICE DAILY   albuterol 108 (90 Base) MCG/ACT inhaler Commonly known as: VENTOLIN HFA Inhale 2 puffs into the lungs every 4 (four) hours as needed for wheezing or shortness of breath (or cough).   allopurinol 100 MG tablet Commonly known as: ZYLOPRIM Take 100 mg by mouth daily.   amLODipine 10 MG tablet Commonly known as: NORVASC Take 1 tablet (10 mg total) by mouth daily. Start taking on: June 28, 2021   apixaban 2.5 MG Tabs tablet Commonly known as: ELIQUIS Take 2.5 mg by mouth every 12 (twelve) hours.   Dorzolamide HCl-Timolol Mal PF 2-0.5 % Soln Place 1 drop into the right eye 2 (two) times daily.   hydrALAZINE 25 MG tablet Commonly known as: APRESOLINE Take 1 tablet (25 mg total) by mouth every 8 (eight) hours.   lisinopril 20 MG tablet Commonly known as: ZESTRIL Take 20 mg by mouth daily.   magnesium oxide 400  MG tablet Commonly known as: MAG-OX Take 1 tablet by mouth 2 (two) times daily.   omeprazole 40 MG capsule Commonly known as: PRILOSEC Take 40 mg by mouth daily.   ondansetron 8 MG disintegrating tablet Commonly known as: ZOFRAN-ODT DISSOLVE 1 TABLET IN MOUTH EVERY 8 HOURS AS NEEDED FOR NAUSEA FOR UP TO 3 DAYS   posaconazole 100 MG Tbec delayed-release tablet Commonly known as: NOXAFIL Take 300 mg by mouth daily.   pregabalin 50 MG capsule Commonly known as: LYRICA Take 50 mg by mouth 3 (three) times a week.   rosuvastatin 10 MG tablet Commonly known as: CRESTOR Take 10 mg by mouth daily.   ruxolitinib phosphate 10 MG tablet Commonly known as: JAKAFI Take 10 mg by mouth 2 (two) times daily.   sildenafil 20 MG tablet Commonly known  as: REVATIO Take 1 to 5 pills as needed What changed:  how much to take how to take this when to take this reasons to take this   tacrolimus 0.5 MG capsule Commonly known as: PROGRAF Take 0.5 mg by mouth daily.   thiamine 100 MG tablet Take 100 mg by mouth daily.   traMADol 50 MG tablet Commonly known as: ULTRAM Take 1 tablet (50 mg total) by mouth every 8 (eight) hours as needed for up to 5 days.   Vitamin D (Ergocalciferol) 1.25 MG (50000 UNIT) Caps capsule Commonly known as: DRISDOL Take 50,000 Units by mouth every 7 (seven) days.        Allergies  Allergen Reactions   Atorvastatin Other (See Comments)    Generalized body aches and cramps   Chlorhexidine Gluconate Other (See Comments) and Rash    Burning sensation and redness   Rosuvastatin Other (See Comments)    cramps   Toprol Xl [Metoprolol Tartrate]     Intolerance-  Made him tired    Consultations: ID Neurology   Procedures/Studies: CT ANGIO HEAD NECK W WO CM  Result Date: 06/25/2021 CLINICAL DATA:  Sudden and severe headache for over 1 week. Elevated blood pressure EXAM: CT ANGIOGRAPHY HEAD AND NECK TECHNIQUE: Multidetector CT imaging of the head and neck was performed using the standard protocol during bolus administration of intravenous contrast. Multiplanar CT image reconstructions and MIPs were obtained to evaluate the vascular anatomy. Carotid stenosis measurements (when applicable) are obtained utilizing NASCET criteria, using the distal internal carotid diameter as the denominator. RADIATION DOSE REDUCTION: This exam was performed according to the departmental dose-optimization program which includes automated exposure control, adjustment of the mA and/or kV according to patient size and/or use of iterative reconstruction technique. CONTRAST:  62m OMNIPAQUE IOHEXOL 350 MG/ML SOLN COMPARISON:  Head CT from 6 days ago FINDINGS: CT HEAD FINDINGS Brain: No evidence of acute infarction, hemorrhage,  hydrocephalus, extra-axial collection or mass lesion/mass effect. Vascular: See below Skull: Unremarkable Sinuses: No sinusitis Orbits: Unremarkable Review of the MIP images confirms the above findings CTA NECK FINDINGS Aortic arch: 3 vessel branching. No acute finding. Partially covered CABG with enhancing LIMA and venous grafts. Right carotid system: Mild plaque at the bifurcation. No stenosis, beading, or dissection Left carotid system: Mild plaque at the bifurcation. ICA tortuosity with looping. No beading or dissection Vertebral arteries: No proximal subclavian stenosis. Calcified plaque at the right vertebral origin more than left vertebral origin. The left vertebral artery is mildly dominant. No stenosis, beading, or dissection. Skeleton: Unremarkable Other neck: No acute finding Upper chest: Negative Review of the MIP images confirms the above findings CTA HEAD FINDINGS Anterior  circulation: No significant stenosis, proximal occlusion, aneurysm, or vascular malformation. Posterior circulation: Mild atheromatous plaque at the right V4 segment. Vertebral and basilar arteries are smoothly contoured and widely patent. No branch occlusion, beading, or aneurysm. Venous sinuses: Unremarkable Anatomic variants: Fetal type right PCA Review of the MIP images confirms the above findings IMPRESSION: 1. No emergent finding or explanation for headache. 2. Mild atherosclerosis. Electronically Signed   By: Jorje Guild M.D.   On: 06/25/2021 06:31   CT Head Wo Contrast  Result Date: 06/19/2021 CLINICAL DATA:  Headache. EXAM: CT HEAD WITHOUT CONTRAST TECHNIQUE: Contiguous axial images were obtained from the base of the skull through the vertex without intravenous contrast. RADIATION DOSE REDUCTION: This exam was performed according to the departmental dose-optimization program which includes automated exposure control, adjustment of the mA and/or kV according to patient size and/or use of iterative reconstruction  technique. COMPARISON:  Head CT dated 08/11/2018. FINDINGS: Brain: The ventricles and sulci appropriate size for patient's age. The gray-white matter discrimination is preserved. Small hypodense focus inferior to the right lentiform nucleus may represent an old lacunar infarct versus a dilated prevascular space. There is no acute intracranial hemorrhage. No mass effect or midline shift. No extra-axial fluid collection. Vascular: No hyperdense vessel or unexpected calcification. Skull: Normal. Negative for fracture or focal lesion. Sinuses/Orbits: No acute finding. Other: None IMPRESSION: No acute intracranial pathology. Electronically Signed   By: Anner Crete M.D.   On: 06/19/2021 19:54   MR BRAIN W WO CONTRAST  Result Date: 06/25/2021 CLINICAL DATA:  Headache, history of multiple myeloma status post bone marrow transplant and subsequent graft-versus-host disease EXAM: MRI HEAD WITHOUT AND WITH CONTRAST TECHNIQUE: Multiplanar, multiecho pulse sequences of the brain and surrounding structures were obtained without and with intravenous contrast. CONTRAST:  7.60m GADAVIST GADOBUTROL 1 MMOL/ML IV SOLN COMPARISON:  Same-day CT/CTA head and neck FINDINGS: Brain: There is no evidence of acute intracranial hemorrhage, extra-axial fluid collection, or acute infarct. Background parenchymal volume is normal. The ventricles are normal in size. There is a minimal burden of white matter microangiopathic change. There is no suspicious parenchymal signal abnormality. There are punctate chronic microhemorrhages in the right external capsule and left thalamus, nonspecific. There is no abnormal enhancement or mass lesion. There is no midline shift. Vascular: Normal flow voids. Skull and upper cervical spine: Normal marrow signal. Sinuses/Orbits: The paranasal sinuses are clear. The globes and orbits are unremarkable. Other: None. IMPRESSION: No acute intracranial pathology to explain the patient's headache. Electronically  Signed   By: PValetta MoleM.D.   On: 06/25/2021 15:44   CT VENOGRAM HEAD  Result Date: 06/25/2021 CLINICAL DATA:  Dural venous sinus thrombosis suspected EXAM: CT VENOGRAM HEAD TECHNIQUE: Venographic phase images of the brain were obtained following the administration of intravenous contrast. Multiplanar reformats and maximum intensity projections were generated. RADIATION DOSE REDUCTION: This exam was performed according to the departmental dose-optimization program which includes automated exposure control, adjustment of the mA and/or kV according to patient size and/or use of iterative reconstruction technique. CONTRAST:  826mOMNIPAQUE IOHEXOL 350 MG/ML SOLN COMPARISON:  None. FINDINGS: Brain: There is no mass, hemorrhage or extra-axial collection. The size and configuration of the ventricles and extra-axial CSF spaces are normal. The brain parenchyma is normal, without acute or chronic infarction. Vascular: No abnormal hyperdensity of the major intracranial arteries or dural venous sinuses. No intracranial atherosclerosis. Superior sagittal sinus: Normal. Straight sinus: Normal. Inferior sagittal sinus, vein of Galen and internal cerebral veins: Normal. Transverse sinuses:  Diminutive left transverse sinus is a normal congenital variant. Normal right transverse sinus. Sigmoid sinuses: Normal. Visualized jugular veins: Normal. Skull: The visualized skull base, calvarium and extracranial soft tissues are normal. Sinuses/Orbits: No fluid levels or advanced mucosal thickening of the visualized paranasal sinuses. No mastoid or middle ear effusion. The orbits are normal. IMPRESSION: Normal CT venogram. Electronically Signed   By: Ulyses Jarred M.D.   On: 06/25/2021 19:05      Subjective: Patient seen and examined the day of discharge.  Headache resolved.  Blood pressure controlled.  Stable for discharge home.  Discharge Exam: Vitals:   06/27/21 0450 06/27/21 0725  BP: 106/66 116/76  Pulse: (!) 58 61   Resp: 13 17  Temp: 97.7 F (36.5 C) 97.7 F (36.5 C)  SpO2: 99%    Vitals:   06/26/21 1952 06/26/21 2328 06/27/21 0450 06/27/21 0725  BP: 105/63 108/62 106/66 116/76  Pulse: 72 69 (!) 58 61  Resp: 16 18 13 17   Temp: 97.9 F (36.6 C) 97.9 F (36.6 C) 97.7 F (36.5 C) 97.7 F (36.5 C)  TempSrc:    Oral  SpO2: 98% 99% 99%   Weight:      Height:        General: Pt is alert, awake, not in acute distress Cardiovascular: RRR, S1/S2 +, no rubs, no gallops Respiratory: CTA bilaterally, no wheezing, no rhonchi Abdominal: Soft, NT, ND, bowel sounds + Extremities: no edema, no cyanosis    The results of significant diagnostics from this hospitalization (including imaging, microbiology, ancillary and laboratory) are listed below for reference.     Microbiology: Recent Results (from the past 240 hour(s))  Resp Panel by RT-PCR (Flu A&B, Covid) Nasopharyngeal Swab     Status: None   Collection Time: 06/19/21  5:46 PM   Specimen: Nasopharyngeal Swab; Nasopharyngeal(NP) swabs in vial transport medium  Result Value Ref Range Status   SARS Coronavirus 2 by RT PCR NEGATIVE NEGATIVE Final    Comment: (NOTE) SARS-CoV-2 target nucleic acids are NOT DETECTED.  The SARS-CoV-2 RNA is generally detectable in upper respiratory specimens during the acute phase of infection. The lowest concentration of SARS-CoV-2 viral copies this assay can detect is 138 copies/mL. A negative result does not preclude SARS-Cov-2 infection and should not be used as the sole basis for treatment or other patient management decisions. A negative result may occur with  improper specimen collection/handling, submission of specimen other than nasopharyngeal swab, presence of viral mutation(s) within the areas targeted by this assay, and inadequate number of viral copies(<138 copies/mL). A negative result must be combined with clinical observations, patient history, and epidemiological information. The expected  result is Negative.  Fact Sheet for Patients:  EntrepreneurPulse.com.au  Fact Sheet for Healthcare Providers:  IncredibleEmployment.be  This test is no t yet approved or cleared by the [place] FDA and  has been authorized for detection and/or diagnosis of SARS-CoV-2 by FDA under an Emergency Use Authorization (EUA). This EUA will remain  in effect (meaning this test can be used) for the duration of the COVID-19 declaration under Section 564(b)(1) of the Act, 21 U.S.C.section 360bbb-3(b)(1), unless the authorization is terminated  or revoked sooner.       Influenza A by PCR NEGATIVE NEGATIVE Final   Influenza B by PCR NEGATIVE NEGATIVE Final    Comment: (NOTE) The Xpert Xpress SARS-CoV-2/FLU/RSV plus assay is intended as an aid in the diagnosis of influenza from Nasopharyngeal swab specimens and should not be used as a sole  basis for treatment. Nasal washings and aspirates are unacceptable for Xpert Xpress SARS-CoV-2/FLU/RSV testing.  Fact Sheet for Patients: EntrepreneurPulse.com.au  Fact Sheet for Healthcare Providers: IncredibleEmployment.be  This test is not yet approved or cleared by the [place] FDA and has been authorized for detection and/or diagnosis of SARS-CoV-2 by FDA under an Emergency Use Authorization (EUA). This EUA will remain in effect (meaning this test can be used) for the duration of the COVID-19 declaration under Section 564(b)(1) of the Act, 21 U.S.C. section 360bbb-3(b)(1), unless the authorization is terminated or revoked.  Performed at [facility], [place] [address], [place], [place] [address]   Resp Panel by RT-PCR (Flu A&B, Covid) Nasopharyngeal Swab     Status: None   Collection Time: 06/25/21  7:34 AM   Specimen: Nasopharyngeal Swab; Nasopharyngeal(NP) swabs in vial transport medium  Result Value Ref Range Status   SARS Coronavirus 2 by RT PCR NEGATIVE  NEGATIVE Final    Comment: (NOTE) SARS-CoV-2 target nucleic acids are NOT DETECTED.  The SARS-CoV-2 RNA is generally detectable in upper respiratory specimens during the acute phase of infection. The lowest concentration of SARS-CoV-2 viral copies this assay can detect is 138 copies/mL. A negative result does not preclude SARS-Cov-2 infection and should not be used as the sole basis for treatment or other patient management decisions. A negative result may occur with  improper specimen collection/handling, submission of specimen other than nasopharyngeal swab, presence of viral mutation(s) within the areas targeted by this assay, and inadequate number of viral copies(<138 copies/mL). A negative result must be combined with clinical observations, patient history, and epidemiological information. The expected result is Negative.  Fact Sheet for Patients:  EntrepreneurPulse.com.au  Fact Sheet for Healthcare Providers:  IncredibleEmployment.be  This test is no t yet approved or cleared by the [place] FDA and  has been authorized for detection and/or diagnosis of SARS-CoV-2 by FDA under an Emergency Use Authorization (EUA). This EUA will remain  in effect (meaning this test can be used) for the duration of the COVID-19 declaration under Section 564(b)(1) of the Act, 21 U.S.C.section 360bbb-3(b)(1), unless the authorization is terminated  or revoked sooner.       Influenza A by PCR NEGATIVE NEGATIVE Final   Influenza B by PCR NEGATIVE NEGATIVE Final    Comment: (NOTE) The Xpert Xpress SARS-CoV-2/FLU/RSV plus assay is intended as an aid in the diagnosis of influenza from Nasopharyngeal swab specimens and should not be used as a sole basis for treatment. Nasal washings and aspirates are unacceptable for Xpert Xpress SARS-CoV-2/FLU/RSV testing.  Fact Sheet for Patients: EntrepreneurPulse.com.au  Fact Sheet for Healthcare  Providers: IncredibleEmployment.be  This test is not yet approved or cleared by the [place] FDA and has been authorized for detection and/or diagnosis of SARS-CoV-2 by FDA under an Emergency Use Authorization (EUA). This EUA will remain in effect (meaning this test can be used) for the duration of the COVID-19 declaration under Section 564(b)(1) of the Act, 21 U.S.C. section 360bbb-3(b)(1), unless the authorization is terminated or revoked.  Performed at [facility], [address] [place], [place] [address]      Labs: BNP (last 3 results) No results for input(s): BNP in the last 8760 hours. Basic Metabolic Panel: Recent Labs  Lab 06/25/21 0128 06/26/21 0457  NA 135 132*  K 4.8 3.3*  CL 101 99  CO2 26 23  GLUCOSE 123* 100*  BUN 16 18  CREATININE 1.10 0.93  CALCIUM 9.8 9.2  Liver Function Tests: Recent Labs  Lab 06/25/21 0128  AST 29  ALT 25  ALKPHOS 36*  BILITOT 1.2  PROT 7.9  ALBUMIN 4.9   Recent Labs  Lab 06/25/21 0128  LIPASE 29   No results for input(s): AMMONIA in the last 168 hours. CBC: Recent Labs  Lab 06/25/21 0128  WBC 5.3  HGB 12.8*  HCT 39.3  MCV 100.0  PLT 217   Cardiac Enzymes: No results for input(s): CKTOTAL, CKMB, CKMBINDEX, TROPONINI in the last 168 hours. BNP: Invalid input(s): POCBNP CBG: No results for input(s): GLUCAP in the last 168 hours. D-Dimer No results for input(s): DDIMER in the last 72 hours. Hgb A1c Recent Labs    06/25/21 0128  HGBA1C 6.1*   Lipid Profile Recent Labs    06/26/21 0457  CHOL 310*  HDL 62  LDLCALC 226*  TRIG 111  CHOLHDL 5.0   Thyroid function studies No results for input(s): TSH, T4TOTAL, T3FREE, THYROIDAB in the last 72 hours.  Invalid input(s): FREET3 Anemia work up No results for input(s): VITAMINB12, FOLATE, FERRITIN, TIBC, IRON, RETICCTPCT in the last 72 hours. Urinalysis    Component Value Date/Time   COLORURINE YELLOW 06/25/2021 0128    APPEARANCEUR CLEAR 06/25/2021 0128   LABSPEC 1.015 06/25/2021 0128   PHURINE 6.5 06/25/2021 0128   GLUCOSEU NEGATIVE 06/25/2021 0128   HGBUR TRACE (A) 06/25/2021 0128   BILIRUBINUR NEGATIVE 06/25/2021 0128   BILIRUBINUR neg 10/11/2010 0949   KETONESUR NEGATIVE 06/25/2021 0128   PROTEINUR 30 (A) 06/25/2021 0128   UROBILINOGEN 1.0 12/23/2012 2321   NITRITE NEGATIVE 06/25/2021 0128   LEUKOCYTESUR NEGATIVE 06/25/2021 0128   Sepsis Labs Invalid input(s): PROCALCITONIN,  WBC,  LACTICIDVEN Microbiology Recent Results (from the past 240 hour(s))  Resp Panel by RT-PCR (Flu A&B, Covid) Nasopharyngeal Swab     Status: None   Collection Time: 06/19/21  5:46 PM   Specimen: Nasopharyngeal Swab; Nasopharyngeal(NP) swabs in vial transport medium  Result Value Ref Range Status   SARS Coronavirus 2 by RT PCR NEGATIVE NEGATIVE Final    Comment: (NOTE) SARS-CoV-2 target nucleic acids are NOT DETECTED.  The SARS-CoV-2 RNA is generally detectable in upper respiratory specimens during the acute phase of infection. The lowest concentration of SARS-CoV-2 viral copies this assay can detect is 138 copies/mL. A negative result does not preclude SARS-Cov-2 infection and should not be used as the sole basis for treatment or other patient management decisions. A negative result may occur with  improper specimen collection/handling, submission of specimen other than nasopharyngeal swab, presence of viral mutation(s) within the areas targeted by this assay, and inadequate number of viral copies(<138 copies/mL). A negative result must be combined with clinical observations, patient history, and epidemiological information. The expected result is Negative.  Fact Sheet for Patients:  EntrepreneurPulse.com.au  Fact Sheet for Healthcare Providers:  IncredibleEmployment.be  This test is no t yet approved or cleared by the [place] FDA and  has been authorized for  detection and/or diagnosis of SARS-CoV-2 by FDA under an Emergency Use Authorization (EUA). This EUA will remain  in effect (meaning this test can be used) for the duration of the COVID-19 declaration under Section 564(b)(1) of the Act, 21 U.S.C.section 360bbb-3(b)(1), unless the authorization is terminated  or revoked sooner.       Influenza A by PCR NEGATIVE NEGATIVE Final   Influenza B by PCR NEGATIVE NEGATIVE Final    Comment: (NOTE) The Xpert Xpress SARS-CoV-2/FLU/RSV plus assay is intended as an aid in  the diagnosis of influenza from Nasopharyngeal swab specimens and should not be used as a sole basis for treatment. Nasal washings and aspirates are unacceptable for Xpert Xpress SARS-CoV-2/FLU/RSV testing.  Fact Sheet for Patients: EntrepreneurPulse.com.au  Fact Sheet for Healthcare Providers: IncredibleEmployment.be  This test is not yet approved or cleared by the [place] FDA and has been authorized for detection and/or diagnosis of SARS-CoV-2 by FDA under an Emergency Use Authorization (EUA). This EUA will remain in effect (meaning this test can be used) for the duration of the COVID-19 declaration under Section 564(b)(1) of the Act, 21 U.S.C. section 360bbb-3(b)(1), unless the authorization is terminated or revoked.  Performed at [facility], [place] [address] [place], [place] [address]   Resp Panel by RT-PCR (Flu A&B, Covid) Nasopharyngeal Swab     Status: None   Collection Time: 06/25/21  7:34 AM   Specimen: Nasopharyngeal Swab; Nasopharyngeal(NP) swabs in vial transport medium  Result Value Ref Range Status   SARS Coronavirus 2 by RT PCR NEGATIVE NEGATIVE Final    Comment: (NOTE) SARS-CoV-2 target nucleic acids are NOT DETECTED.  The SARS-CoV-2 RNA is generally detectable in upper respiratory specimens during the acute phase of infection. The lowest concentration of SARS-CoV-2 viral copies this assay can detect  is 138 copies/mL. A negative result does not preclude SARS-Cov-2 infection and should not be used as the sole basis for treatment or other patient management decisions. A negative result may occur with  improper specimen collection/handling, submission of specimen other than nasopharyngeal swab, presence of viral mutation(s) within the areas targeted by this assay, and inadequate number of viral copies(<138 copies/mL). A negative result must be combined with clinical observations, patient history, and epidemiological information. The expected result is Negative.  Fact Sheet for Patients:  EntrepreneurPulse.com.au  Fact Sheet for Healthcare Providers:  IncredibleEmployment.be  This test is no t yet approved or cleared by the [place] FDA and  has been authorized for detection and/or diagnosis of SARS-CoV-2 by FDA under an Emergency Use Authorization (EUA). This EUA will remain  in effect (meaning this test can be used) for the duration of the COVID-19 declaration under Section 564(b)(1) of the Act, 21 U.S.C.section 360bbb-3(b)(1), unless the authorization is terminated  or revoked sooner.       Influenza A by PCR NEGATIVE NEGATIVE Final   Influenza B by PCR NEGATIVE NEGATIVE Final    Comment: (NOTE) The Xpert Xpress SARS-CoV-2/FLU/RSV plus assay is intended as an aid in the diagnosis of influenza from Nasopharyngeal swab specimens and should not be used as a sole basis for treatment. Nasal washings and aspirates are unacceptable for Xpert Xpress SARS-CoV-2/FLU/RSV testing.  Fact Sheet for Patients: EntrepreneurPulse.com.au  Fact Sheet for Healthcare Providers: IncredibleEmployment.be  This test is not yet approved or cleared by the [place] FDA and has been authorized for detection and/or diagnosis of SARS-CoV-2 by FDA under an Emergency Use Authorization (EUA). This EUA will remain in effect  (meaning this test can be used) for the duration of the COVID-19 declaration under Section 564(b)(1) of the Act, 21 U.S.C. section 360bbb-3(b)(1), unless the authorization is terminated or revoked.  Performed at [facility], [address] [place], [place] [address]      Time coordinating discharge: Over 30 minutes  SIGNED:   Sidney Ace, MD  [facility] 06/27/2021, 1:27 PM Pager   If 7PM-7AM, please contact night-coverage

## 2021-06-27 NOTE — TOC CM/SW Note (Signed)
Patient has orders to discharge home today. Chart reviewed. PCP is Jill Alexanders, MD. On room air. No wounds. No TOC needs identified. CSW signing off.  Dayton Scrape, [place]

## 2021-06-27 NOTE — Progress Notes (Signed)
Discussed discharge instructions with patient and wife.   Encourage patient to monitor BP daily and as needed is exhibits signs of low BP (dizziness, especially upon standing).   Encourage patient to keep all follow up appointments and medications as directed by MD.

## 2021-06-27 NOTE — Progress Notes (Signed)
Since patient headache is improved with better blood pressure recommend continue blood pressure regimen. Cardiology will sign off. Reconsult if necessary Manuel Dall

## 2021-06-28 ENCOUNTER — Telehealth: Payer: Self-pay | Admitting: Family Medicine

## 2021-06-28 LAB — EHRLICHIA ANTIBODY PANEL
E chaffeensis (HGE) Ab, IgG: NEGATIVE
E chaffeensis (HGE) Ab, IgM: NEGATIVE
E. Chaffeensis (HME) IgM Titer: NEGATIVE
E.Chaffeensis (HME) IgG: NEGATIVE

## 2021-06-28 LAB — LYME DISEASE SEROLOGY W/REFLEX: Lyme Total Antibody EIA: NEGATIVE

## 2021-06-28 NOTE — Telephone Encounter (Signed)
I called pt concerning new ER visit. PT was advised that follow up with [facility] was recommended and pt was asked to call and schedule.

## 2021-07-07 DIAGNOSIS — N529 Male erectile dysfunction, unspecified: Secondary | ICD-10-CM | POA: Diagnosis not present

## 2021-07-13 DIAGNOSIS — T865 Complications of stem cell transplant: Secondary | ICD-10-CM | POA: Diagnosis not present

## 2021-07-13 DIAGNOSIS — Z7901 Long term (current) use of anticoagulants: Secondary | ICD-10-CM | POA: Diagnosis not present

## 2021-07-13 DIAGNOSIS — Z79899 Other long term (current) drug therapy: Secondary | ICD-10-CM | POA: Diagnosis not present

## 2021-07-13 DIAGNOSIS — Z95818 Presence of other cardiac implants and grafts: Secondary | ICD-10-CM | POA: Diagnosis not present

## 2021-07-13 DIAGNOSIS — C9000 Multiple myeloma not having achieved remission: Secondary | ICD-10-CM | POA: Diagnosis not present

## 2021-07-13 DIAGNOSIS — Z9889 Other specified postprocedural states: Secondary | ICD-10-CM | POA: Diagnosis not present

## 2021-07-13 DIAGNOSIS — Z86718 Personal history of other venous thrombosis and embolism: Secondary | ICD-10-CM | POA: Diagnosis not present

## 2021-07-13 DIAGNOSIS — I2510 Atherosclerotic heart disease of native coronary artery without angina pectoris: Secondary | ICD-10-CM | POA: Diagnosis not present

## 2021-07-13 DIAGNOSIS — D89811 Chronic graft-versus-host disease: Secondary | ICD-10-CM | POA: Diagnosis not present

## 2021-07-13 DIAGNOSIS — Z951 Presence of aortocoronary bypass graft: Secondary | ICD-10-CM | POA: Diagnosis not present

## 2021-07-13 DIAGNOSIS — Z9481 Bone marrow transplant status: Secondary | ICD-10-CM | POA: Diagnosis not present

## 2021-07-13 DIAGNOSIS — Y834 Other reconstructive surgery as the cause of abnormal reaction of the patient, or of later complication, without mention of misadventure at the time of the procedure: Secondary | ICD-10-CM | POA: Diagnosis not present

## 2021-07-13 DIAGNOSIS — R0602 Shortness of breath: Secondary | ICD-10-CM | POA: Diagnosis not present

## 2021-07-19 DIAGNOSIS — D849 Immunodeficiency, unspecified: Secondary | ICD-10-CM | POA: Diagnosis not present

## 2021-07-19 DIAGNOSIS — Z9481 Bone marrow transplant status: Secondary | ICD-10-CM | POA: Diagnosis not present

## 2021-07-19 DIAGNOSIS — K0889 Other specified disorders of teeth and supporting structures: Secondary | ICD-10-CM | POA: Diagnosis not present

## 2021-07-19 DIAGNOSIS — Z952 Presence of prosthetic heart valve: Secondary | ICD-10-CM | POA: Diagnosis not present

## 2021-07-19 DIAGNOSIS — R69 Illness, unspecified: Secondary | ICD-10-CM | POA: Diagnosis not present

## 2021-07-19 DIAGNOSIS — C9000 Multiple myeloma not having achieved remission: Secondary | ICD-10-CM | POA: Diagnosis not present

## 2021-07-19 DIAGNOSIS — I160 Hypertensive urgency: Secondary | ICD-10-CM | POA: Diagnosis not present

## 2021-07-19 DIAGNOSIS — Z79899 Other long term (current) drug therapy: Secondary | ICD-10-CM | POA: Diagnosis not present

## 2021-07-19 DIAGNOSIS — T865 Complications of stem cell transplant: Secondary | ICD-10-CM | POA: Diagnosis not present

## 2021-07-19 DIAGNOSIS — Y834 Other reconstructive surgery as the cause of abnormal reaction of the patient, or of later complication, without mention of misadventure at the time of the procedure: Secondary | ICD-10-CM | POA: Diagnosis not present

## 2021-07-19 DIAGNOSIS — D89811 Chronic graft-versus-host disease: Secondary | ICD-10-CM | POA: Diagnosis not present

## 2021-08-01 DIAGNOSIS — E782 Mixed hyperlipidemia: Secondary | ICD-10-CM | POA: Diagnosis not present

## 2021-08-01 DIAGNOSIS — I2510 Atherosclerotic heart disease of native coronary artery without angina pectoris: Secondary | ICD-10-CM | POA: Diagnosis not present

## 2021-08-01 DIAGNOSIS — Z789 Other specified health status: Secondary | ICD-10-CM | POA: Diagnosis not present

## 2021-08-04 DIAGNOSIS — H4061X0 Glaucoma secondary to drugs, right eye, stage unspecified: Secondary | ICD-10-CM | POA: Diagnosis not present

## 2021-08-04 DIAGNOSIS — T380X5A Adverse effect of glucocorticoids and synthetic analogues, initial encounter: Secondary | ICD-10-CM | POA: Diagnosis not present

## 2021-08-04 DIAGNOSIS — N529 Male erectile dysfunction, unspecified: Secondary | ICD-10-CM | POA: Diagnosis not present

## 2021-08-09 DIAGNOSIS — H04123 Dry eye syndrome of bilateral lacrimal glands: Secondary | ICD-10-CM | POA: Diagnosis not present

## 2021-08-09 DIAGNOSIS — D89813 Graft-versus-host disease, unspecified: Secondary | ICD-10-CM | POA: Diagnosis not present

## 2021-08-09 DIAGNOSIS — H0220C Unspecified lagophthalmos, bilateral, upper and lower eyelids: Secondary | ICD-10-CM | POA: Diagnosis not present

## 2021-08-09 DIAGNOSIS — H02883 Meibomian gland dysfunction of right eye, unspecified eyelid: Secondary | ICD-10-CM | POA: Diagnosis not present

## 2021-08-11 DIAGNOSIS — C9000 Multiple myeloma not having achieved remission: Secondary | ICD-10-CM | POA: Diagnosis not present

## 2021-08-11 DIAGNOSIS — Z9481 Bone marrow transplant status: Secondary | ICD-10-CM | POA: Diagnosis not present

## 2021-08-14 DIAGNOSIS — C9000 Multiple myeloma not having achieved remission: Secondary | ICD-10-CM | POA: Diagnosis not present

## 2021-08-14 DIAGNOSIS — Z9481 Bone marrow transplant status: Secondary | ICD-10-CM | POA: Diagnosis not present

## 2021-08-30 DIAGNOSIS — D801 Nonfamilial hypogammaglobulinemia: Secondary | ICD-10-CM | POA: Diagnosis not present

## 2021-08-30 DIAGNOSIS — D89811 Chronic graft-versus-host disease: Secondary | ICD-10-CM | POA: Diagnosis not present

## 2021-08-30 DIAGNOSIS — Z9481 Bone marrow transplant status: Secondary | ICD-10-CM | POA: Diagnosis not present

## 2021-08-30 DIAGNOSIS — C9000 Multiple myeloma not having achieved remission: Secondary | ICD-10-CM | POA: Diagnosis not present

## 2021-08-30 DIAGNOSIS — Z5181 Encounter for therapeutic drug level monitoring: Secondary | ICD-10-CM | POA: Diagnosis not present

## 2021-09-29 ENCOUNTER — Encounter: Payer: Self-pay | Admitting: Family Medicine

## 2021-12-13 DIAGNOSIS — T451X5A Adverse effect of antineoplastic and immunosuppressive drugs, initial encounter: Secondary | ICD-10-CM | POA: Diagnosis not present

## 2021-12-13 DIAGNOSIS — D801 Nonfamilial hypogammaglobulinemia: Secondary | ICD-10-CM | POA: Diagnosis not present

## 2021-12-13 DIAGNOSIS — Z5181 Encounter for therapeutic drug level monitoring: Secondary | ICD-10-CM | POA: Diagnosis not present

## 2021-12-13 DIAGNOSIS — G620 Drug-induced polyneuropathy: Secondary | ICD-10-CM | POA: Diagnosis not present

## 2021-12-13 DIAGNOSIS — D89811 Chronic graft-versus-host disease: Secondary | ICD-10-CM | POA: Diagnosis not present

## 2021-12-13 DIAGNOSIS — G8929 Other chronic pain: Secondary | ICD-10-CM | POA: Diagnosis not present

## 2021-12-13 DIAGNOSIS — Z9481 Bone marrow transplant status: Secondary | ICD-10-CM | POA: Diagnosis not present

## 2021-12-13 DIAGNOSIS — C9001 Multiple myeloma in remission: Secondary | ICD-10-CM | POA: Diagnosis not present

## 2021-12-13 DIAGNOSIS — C9000 Multiple myeloma not having achieved remission: Secondary | ICD-10-CM | POA: Diagnosis not present

## 2021-12-13 DIAGNOSIS — R252 Cramp and spasm: Secondary | ICD-10-CM | POA: Diagnosis not present

## 2022-01-11 DIAGNOSIS — Z86718 Personal history of other venous thrombosis and embolism: Secondary | ICD-10-CM | POA: Diagnosis not present

## 2022-01-11 DIAGNOSIS — Z9889 Other specified postprocedural states: Secondary | ICD-10-CM | POA: Diagnosis not present

## 2022-01-11 DIAGNOSIS — Z9484 Stem cells transplant status: Secondary | ICD-10-CM | POA: Diagnosis not present

## 2022-01-11 DIAGNOSIS — Z95818 Presence of other cardiac implants and grafts: Secondary | ICD-10-CM | POA: Diagnosis not present

## 2022-01-11 DIAGNOSIS — M79603 Pain in arm, unspecified: Secondary | ICD-10-CM | POA: Diagnosis not present

## 2022-01-11 DIAGNOSIS — Z9481 Bone marrow transplant status: Secondary | ICD-10-CM | POA: Diagnosis not present

## 2022-01-11 DIAGNOSIS — I2510 Atherosclerotic heart disease of native coronary artery without angina pectoris: Secondary | ICD-10-CM | POA: Diagnosis not present

## 2022-01-11 DIAGNOSIS — Z79899 Other long term (current) drug therapy: Secondary | ICD-10-CM | POA: Diagnosis not present

## 2022-01-11 DIAGNOSIS — Z7901 Long term (current) use of anticoagulants: Secondary | ICD-10-CM | POA: Diagnosis not present

## 2022-01-11 DIAGNOSIS — R079 Chest pain, unspecified: Secondary | ICD-10-CM | POA: Diagnosis not present

## 2022-01-11 DIAGNOSIS — R0602 Shortness of breath: Secondary | ICD-10-CM | POA: Diagnosis not present

## 2022-01-11 DIAGNOSIS — I083 Combined rheumatic disorders of mitral, aortic and tricuspid valves: Secondary | ICD-10-CM | POA: Diagnosis not present

## 2022-01-11 DIAGNOSIS — C9000 Multiple myeloma not having achieved remission: Secondary | ICD-10-CM | POA: Diagnosis not present

## 2022-01-11 DIAGNOSIS — Z951 Presence of aortocoronary bypass graft: Secondary | ICD-10-CM | POA: Diagnosis not present

## 2022-01-16 DIAGNOSIS — I2510 Atherosclerotic heart disease of native coronary artery without angina pectoris: Secondary | ICD-10-CM | POA: Diagnosis not present

## 2022-01-16 DIAGNOSIS — Z8579 Personal history of other malignant neoplasms of lymphoid, hematopoietic and related tissues: Secondary | ICD-10-CM | POA: Diagnosis not present

## 2022-01-16 DIAGNOSIS — I25118 Atherosclerotic heart disease of native coronary artery with other forms of angina pectoris: Secondary | ICD-10-CM | POA: Diagnosis not present

## 2022-01-16 DIAGNOSIS — Z951 Presence of aortocoronary bypass graft: Secondary | ICD-10-CM | POA: Diagnosis not present

## 2022-01-16 DIAGNOSIS — I440 Atrioventricular block, first degree: Secondary | ICD-10-CM | POA: Diagnosis not present

## 2022-01-16 DIAGNOSIS — I25718 Atherosclerosis of autologous vein coronary artery bypass graft(s) with other forms of angina pectoris: Secondary | ICD-10-CM | POA: Diagnosis not present

## 2022-01-16 DIAGNOSIS — I7092 Chronic total occlusion of artery of the extremities: Secondary | ICD-10-CM | POA: Diagnosis not present

## 2022-01-16 DIAGNOSIS — Z7982 Long term (current) use of aspirin: Secondary | ICD-10-CM | POA: Diagnosis not present

## 2022-01-16 DIAGNOSIS — Z9484 Stem cells transplant status: Secondary | ICD-10-CM | POA: Diagnosis not present

## 2022-01-16 DIAGNOSIS — Z8249 Family history of ischemic heart disease and other diseases of the circulatory system: Secondary | ICD-10-CM | POA: Diagnosis not present

## 2022-01-16 DIAGNOSIS — Z95818 Presence of other cardiac implants and grafts: Secondary | ICD-10-CM | POA: Diagnosis not present

## 2022-01-16 DIAGNOSIS — R0609 Other forms of dyspnea: Secondary | ICD-10-CM | POA: Diagnosis not present

## 2022-01-16 DIAGNOSIS — Z86718 Personal history of other venous thrombosis and embolism: Secondary | ICD-10-CM | POA: Diagnosis not present

## 2022-01-16 DIAGNOSIS — R001 Bradycardia, unspecified: Secondary | ICD-10-CM | POA: Diagnosis not present

## 2022-01-16 DIAGNOSIS — R9431 Abnormal electrocardiogram [ECG] [EKG]: Secondary | ICD-10-CM | POA: Diagnosis not present

## 2022-01-22 DIAGNOSIS — L578 Other skin changes due to chronic exposure to nonionizing radiation: Secondary | ICD-10-CM | POA: Diagnosis not present

## 2022-01-22 DIAGNOSIS — L570 Actinic keratosis: Secondary | ICD-10-CM | POA: Diagnosis not present

## 2022-01-30 ENCOUNTER — Other Ambulatory Visit: Payer: Self-pay | Admitting: Medical

## 2022-01-31 ENCOUNTER — Encounter: Payer: Self-pay | Admitting: Internal Medicine

## 2022-02-06 DIAGNOSIS — Z789 Other specified health status: Secondary | ICD-10-CM | POA: Diagnosis not present

## 2022-02-06 DIAGNOSIS — E782 Mixed hyperlipidemia: Secondary | ICD-10-CM | POA: Diagnosis not present

## 2022-02-06 DIAGNOSIS — I2510 Atherosclerotic heart disease of native coronary artery without angina pectoris: Secondary | ICD-10-CM | POA: Diagnosis not present

## 2022-02-25 DEATH — deceased

## 2022-02-27 ENCOUNTER — Telehealth: Payer: Self-pay | Admitting: Licensed Clinical Social Worker

## 2022-02-27 NOTE — Patient Outreach (Signed)
  Care Coordination   02/27/2022 Name: [name] MRN: [id number] DOB: [birth year]   Care Coordination Outreach Attempts:  An unsuccessful telephone outreach was attempted today to offer the patient information about available care coordination services as a benefit of their health plan.   Follow Up Plan:  Additional outreach attempts will be made to offer the patient care coordination information and services.   Encounter Outcome:  No Answer  Care Coordination Interventions Activated:  No   Care Coordination Interventions:  No, not indicated    Christa See, MSW, [name] Phone [phone] 12:57 PM

## 2022-03-06 ENCOUNTER — Encounter: Payer: Self-pay | Admitting: Internal Medicine

## 2022-03-19 ENCOUNTER — Encounter: Payer: Self-pay | Admitting: Internal Medicine

## 2022-11-16 IMAGING — MR MR HEAD WO/W CM
14 series · 48 of 48 positions shown · IV contrast (gadavist)
Comparison: Same-day CT/CTA head and neck

CLINICAL DATA: Headache, history of multiple myeloma status post
bone marrow transplant and subsequent graft-versus-host disease

EXAM:
MRI HEAD WITHOUT AND WITH CONTRAST
TECHNIQUE: Multiplanar, multiecho pulse sequences of the brain and surrounding
structures were obtained without and with intravenous contrast.
CONTRAST:  7.5mL GADAVIST GADOBUTROL 1 MMOL/ML IV SOLN

[Series 5: ax dwi_tracew · axial · 3.0mm · 0.71mm/px · z∈[-112,+51]mm · 4 of 56 slices shown]
[im 1/56]
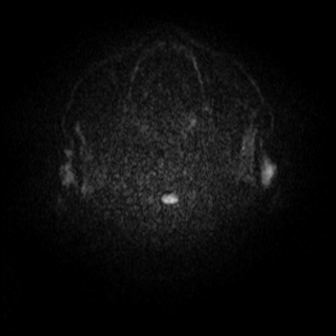
[im 19/56]
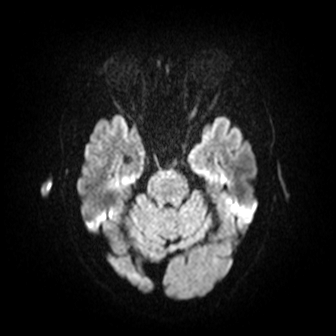
[im 37/56]
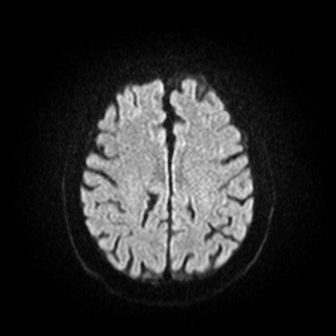
[im 56/56]
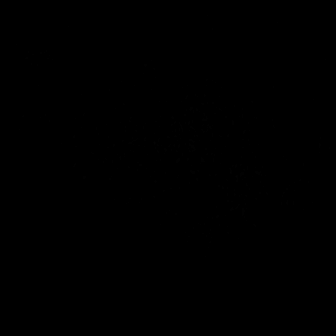

[Series 6: ax dwi_adc · axial · 3.0mm · 0.71mm/px · z∈[-112,+48]mm · 3 of 55 slices shown]
[im 1/55]
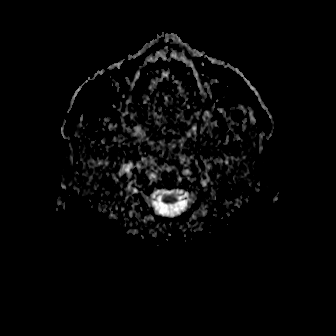
[im 28/55]
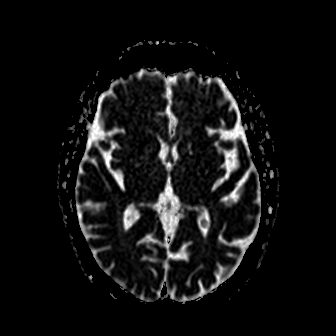
[im 55/55]
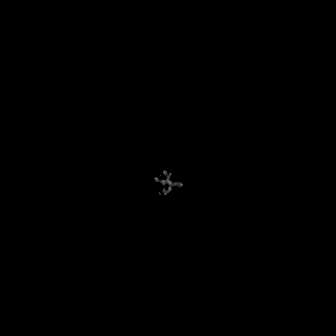

[Series 7: cor dwi_tracew · coronal · 5.0mm · 0.68mm/px · 2 of 40 slices shown]
[im 1/40]
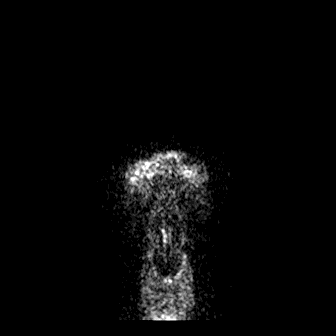
[im 40/40]
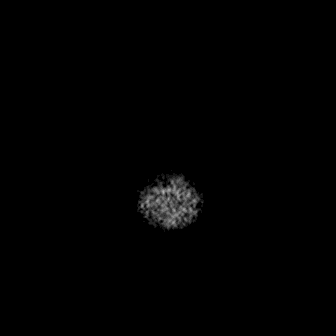

[Series 8: cor dwi_adc · coronal · 5.0mm · 0.68mm/px · 2 of 39 slices shown]
[im 1/39]
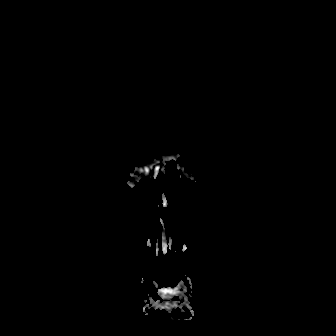
[im 39/39]
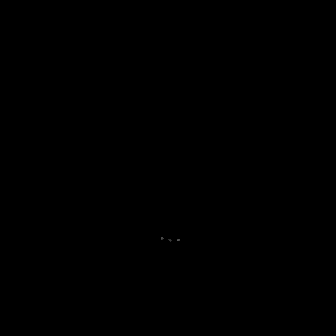

[Series 9: T1 · sagittal · 5.0mm · 0.47mm/px · 1 of 22 slices shown (1 of 2)]
[im 1/22]
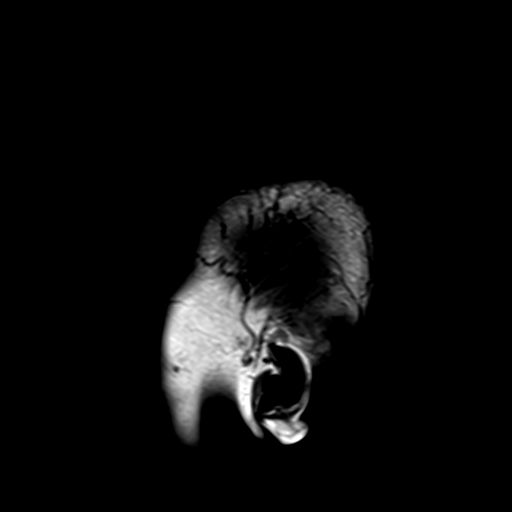

[Series 10: T2 · axial · 5.0mm · 0.86mm/px · z∈[-106,+48]mm · 2 of 27 slices shown (1 of 2)]
[im 1/27]
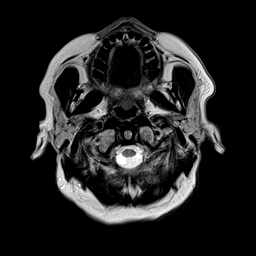
[im 27/27]
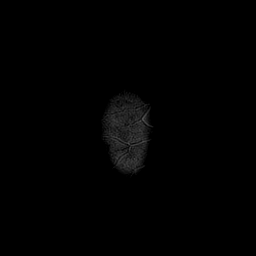

[Series 12: pha_images · axial · 3.0mm · 0.90mm/px · z∈[-105,+46]mm · 3 of 52 slices shown]
[im 1/52]
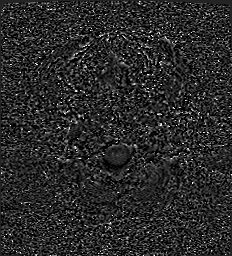
[im 26/52]
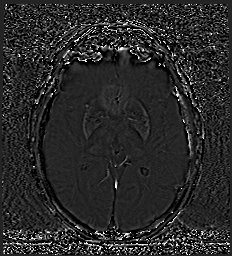
[im 52/52]
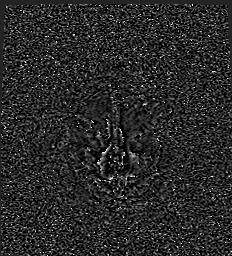

[Series 13: swi_images · axial · 3.0mm · 0.90mm/px · z∈[-105,+46]mm · 3 of 52 slices shown]
[im 1/52]
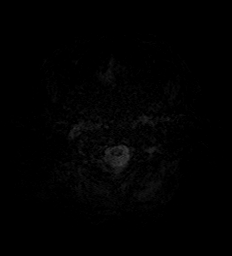
[im 26/52]
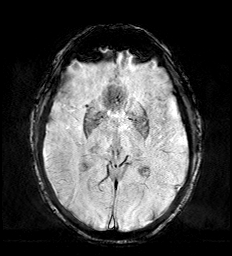
[im 52/52]
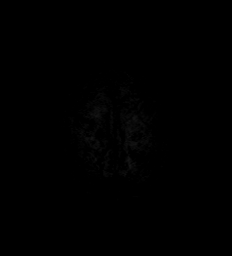

[Series 25: FLAIR · axial · 3.0mm · 0.69mm/px · z∈[-110,+51]mm · 3 of 55 slices shown]
[im 1/55]
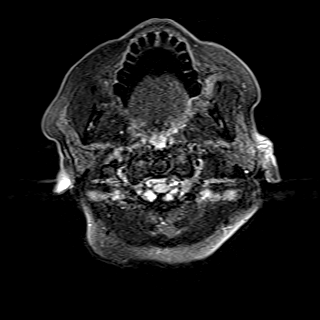
[im 28/55]
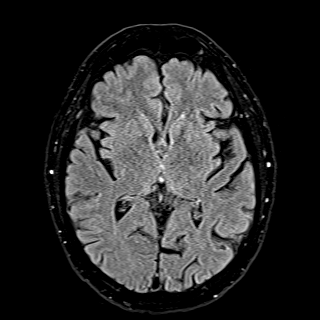
[im 55/55]
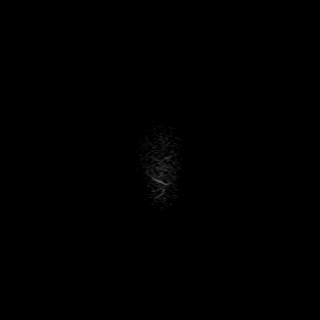

[Series 26: T1 · axial · 1.0mm · 0.98mm/px · z∈[-109,+49]mm · 10 of 160 slices shown (2 of 2)]
[im 1/160]
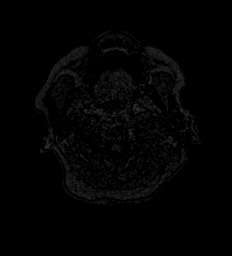
[im 18/160]
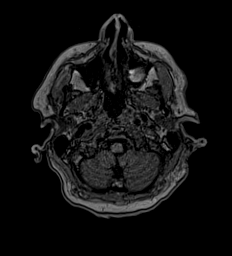
[im 36/160]
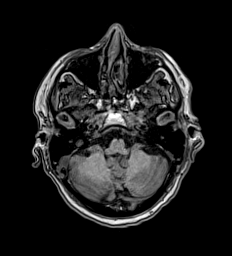
[im 54/160]
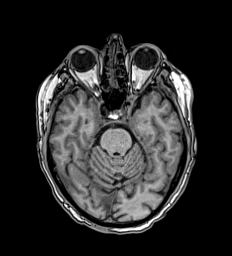
[im 71/160]
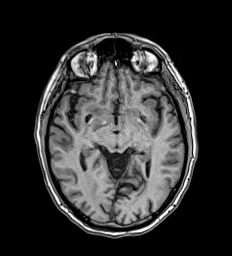
[im 89/160]
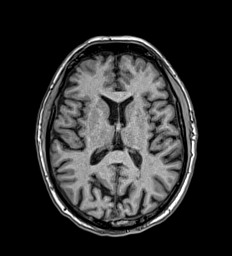
[im 107/160]
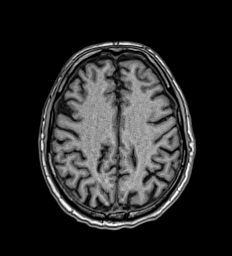
[im 124/160]
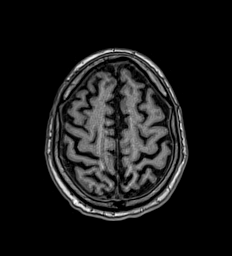
[im 142/160]
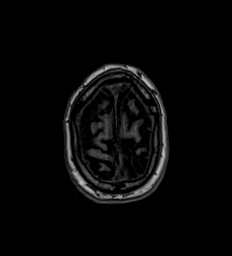
[im 160/160]
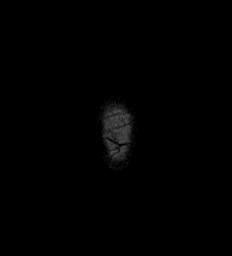

[Series 27: T2 · coronal · 5.0mm · 0.86mm/px · 2 of 30 slices shown (2 of 2)]
[im 1/30]
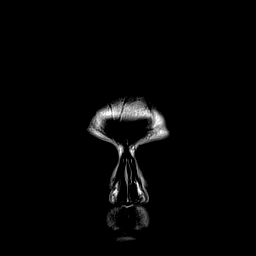
[im 30/30]
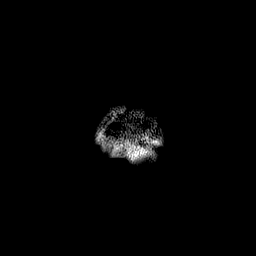

[Series 28: T1 post-contrast · axial · 1.0mm · 0.98mm/px · z∈[-109,+49]mm · 10 of 160 slices shown (1 of 3)]
[im 1/160]
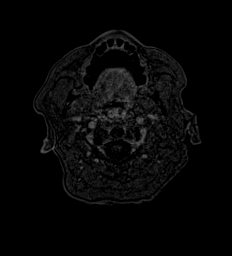
[im 18/160]
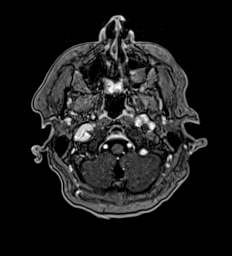
[im 36/160]
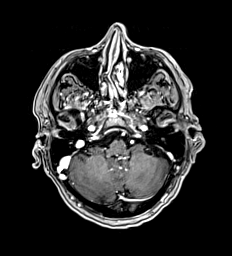
[im 54/160]
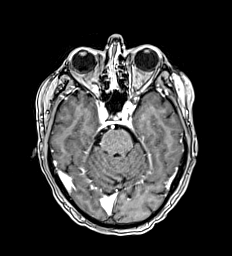
[im 71/160]
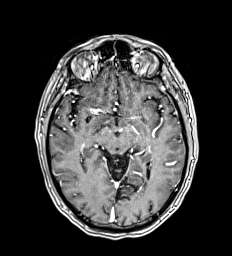
[im 89/160]
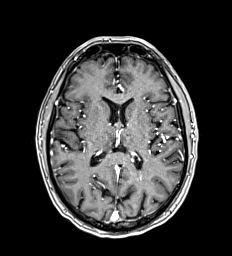
[im 107/160]
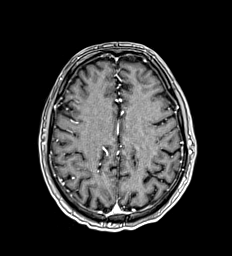
[im 124/160]
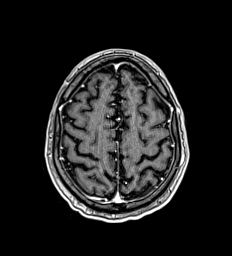
[im 142/160]
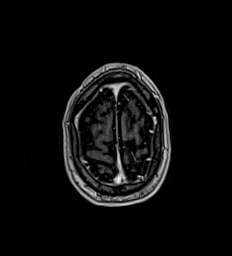
[im 160/160]
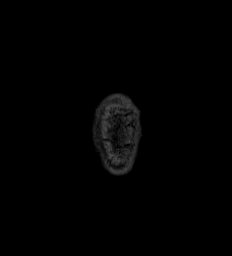

[Series 29: T1 post-contrast · coronal · 5.0mm · 0.43mm/px · 2 of 30 slices shown (2 of 3)]
[im 1/30]
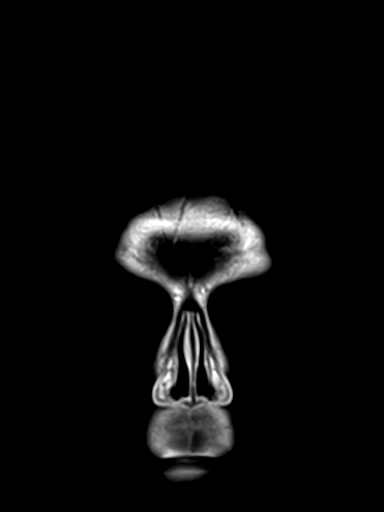
[im 30/30]
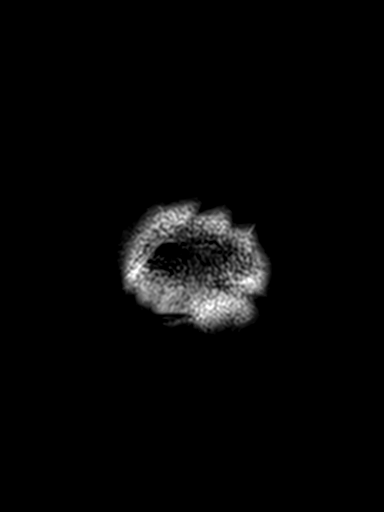

[Series 30: T1 post-contrast · sagittal · 5.0mm · 0.47mm/px · 1 of 22 slices shown (3 of 3)]
[im 1/22]
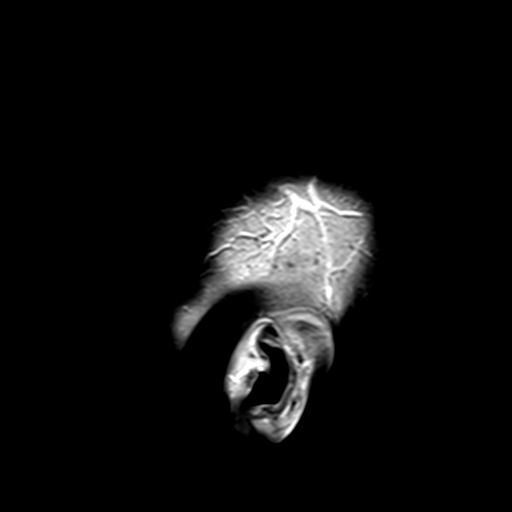

[48 of 48 positions shown; findings below may reference images not displayed]

FINDINGS: Brain: There is no evidence of acute intracranial hemorrhage,
extra-axial fluid collection, or acute infarct.

Background parenchymal volume is normal. The ventricles are normal
in size. There is a minimal burden of white matter microangiopathic
change. There is no suspicious parenchymal signal abnormality. There
are punctate chronic microhemorrhages in the right external capsule
and left thalamus, nonspecific.

There is no abnormal enhancement or mass lesion. There is no midline
shift.

Vascular: Normal flow voids.

Skull and upper cervical spine: Normal marrow signal.

Sinuses/Orbits: The paranasal sinuses are clear. The globes and
orbits are unremarkable.

Other: None.
IMPRESSION: No acute intracranial pathology to explain the patient's headache.

## 2023-07-23 ENCOUNTER — Encounter: Payer: Self-pay | Admitting: Internal Medicine
